# Patient Record
Sex: Female | Born: 1955 | Race: White | Hispanic: No | Marital: Married | Smoking: Former smoker
Health system: Southern US, Community
[De-identification: ages and names within clinical notes are randomized; demographics above are authoritative.]

## PROBLEM LIST (undated history)

## (undated) DIAGNOSIS — J45909 Unspecified asthma, uncomplicated: Secondary | ICD-10-CM

## (undated) DIAGNOSIS — Z9989 Dependence on other enabling machines and devices: Secondary | ICD-10-CM

## (undated) DIAGNOSIS — M1990 Unspecified osteoarthritis, unspecified site: Secondary | ICD-10-CM

## (undated) DIAGNOSIS — E785 Hyperlipidemia, unspecified: Secondary | ICD-10-CM

## (undated) DIAGNOSIS — G510 Bell's palsy: Secondary | ICD-10-CM

## (undated) DIAGNOSIS — F419 Anxiety disorder, unspecified: Secondary | ICD-10-CM

## (undated) DIAGNOSIS — J449 Chronic obstructive pulmonary disease, unspecified: Secondary | ICD-10-CM

## (undated) DIAGNOSIS — D3613 Benign neoplasm of peripheral nerves and autonomic nervous system of lower limb, including hip: Secondary | ICD-10-CM

## (undated) DIAGNOSIS — I10 Essential (primary) hypertension: Secondary | ICD-10-CM

## (undated) DIAGNOSIS — Z973 Presence of spectacles and contact lenses: Secondary | ICD-10-CM

## (undated) DIAGNOSIS — I219 Acute myocardial infarction, unspecified: Secondary | ICD-10-CM

## (undated) DIAGNOSIS — G4733 Obstructive sleep apnea (adult) (pediatric): Secondary | ICD-10-CM

## (undated) DIAGNOSIS — N951 Menopausal and female climacteric states: Secondary | ICD-10-CM

## (undated) DIAGNOSIS — Z87898 Personal history of other specified conditions: Secondary | ICD-10-CM

## (undated) HISTORY — DX: Chronic obstructive pulmonary disease, unspecified: J44.9

## (undated) HISTORY — DX: Bell's palsy: G51.0

## (undated) HISTORY — DX: Hyperlipidemia, unspecified: E78.5

## (undated) HISTORY — DX: Dependence on other enabling machines and devices: Z99.89

## (undated) HISTORY — DX: Obstructive sleep apnea (adult) (pediatric): G47.33

## (undated) HISTORY — DX: Acute myocardial infarction, unspecified: I21.9

## (undated) HISTORY — DX: Essential (primary) hypertension: I10

## (undated) HISTORY — DX: Benign neoplasm of peripheral nerves and autonomic nervous system of lower limb, including hip: D36.13

---

## 1999-01-30 ENCOUNTER — Other Ambulatory Visit: Admission: RE | Admit: 1999-01-30 | Discharge: 1999-01-30 | Payer: Self-pay | Admitting: Family Medicine

## 2000-03-04 ENCOUNTER — Encounter: Payer: Self-pay | Admitting: Orthopedic Surgery

## 2000-03-04 ENCOUNTER — Ambulatory Visit (HOSPITAL_COMMUNITY): Admission: RE | Admit: 2000-03-04 | Discharge: 2000-03-04 | Payer: Self-pay | Admitting: Orthopedic Surgery

## 2000-03-21 ENCOUNTER — Ambulatory Visit (HOSPITAL_BASED_OUTPATIENT_CLINIC_OR_DEPARTMENT_OTHER): Admission: RE | Admit: 2000-03-21 | Discharge: 2000-03-21 | Payer: Self-pay | Admitting: Orthopedic Surgery

## 2000-04-16 HISTORY — PX: ELBOW SURGERY: SHX618

## 2000-07-11 ENCOUNTER — Other Ambulatory Visit: Admission: RE | Admit: 2000-07-11 | Discharge: 2000-07-11 | Payer: Self-pay | Admitting: Family Medicine

## 2001-07-22 ENCOUNTER — Other Ambulatory Visit: Admission: RE | Admit: 2001-07-22 | Discharge: 2001-07-22 | Payer: Self-pay | Admitting: Family Medicine

## 2002-08-11 ENCOUNTER — Ambulatory Visit (HOSPITAL_BASED_OUTPATIENT_CLINIC_OR_DEPARTMENT_OTHER): Admission: RE | Admit: 2002-08-11 | Discharge: 2002-08-11 | Payer: Self-pay | Admitting: Pulmonary Disease

## 2002-08-11 ENCOUNTER — Encounter: Payer: Self-pay | Admitting: Pulmonary Disease

## 2002-10-28 ENCOUNTER — Ambulatory Visit (HOSPITAL_BASED_OUTPATIENT_CLINIC_OR_DEPARTMENT_OTHER): Admission: RE | Admit: 2002-10-28 | Discharge: 2002-10-28 | Payer: Self-pay | Admitting: Pulmonary Disease

## 2003-04-29 ENCOUNTER — Ambulatory Visit (HOSPITAL_COMMUNITY): Admission: RE | Admit: 2003-04-29 | Discharge: 2003-04-30 | Payer: Self-pay | Admitting: Otolaryngology

## 2003-07-20 ENCOUNTER — Encounter: Admission: RE | Admit: 2003-07-20 | Discharge: 2003-07-20 | Payer: Self-pay | Admitting: Family Medicine

## 2003-10-08 ENCOUNTER — Other Ambulatory Visit: Admission: RE | Admit: 2003-10-08 | Discharge: 2003-10-08 | Payer: Self-pay | Admitting: Family Medicine

## 2003-12-24 ENCOUNTER — Ambulatory Visit (HOSPITAL_COMMUNITY): Admission: RE | Admit: 2003-12-24 | Discharge: 2003-12-24 | Payer: Self-pay | Admitting: Obstetrics and Gynecology

## 2003-12-24 ENCOUNTER — Encounter (INDEPENDENT_AMBULATORY_CARE_PROVIDER_SITE_OTHER): Payer: Self-pay | Admitting: Specialist

## 2004-06-21 ENCOUNTER — Ambulatory Visit: Payer: Self-pay | Admitting: Family Medicine

## 2004-06-22 ENCOUNTER — Ambulatory Visit: Payer: Self-pay | Admitting: Family Medicine

## 2004-06-26 ENCOUNTER — Ambulatory Visit: Payer: Self-pay | Admitting: Family Medicine

## 2004-07-04 ENCOUNTER — Ambulatory Visit: Payer: Self-pay | Admitting: Family Medicine

## 2004-07-21 ENCOUNTER — Ambulatory Visit: Payer: Self-pay | Admitting: Family Medicine

## 2004-09-19 ENCOUNTER — Ambulatory Visit: Payer: Self-pay | Admitting: Family Medicine

## 2004-10-09 ENCOUNTER — Ambulatory Visit: Payer: Self-pay | Admitting: Family Medicine

## 2004-11-23 ENCOUNTER — Ambulatory Visit: Payer: Self-pay | Admitting: Family Medicine

## 2004-11-23 ENCOUNTER — Other Ambulatory Visit: Admission: RE | Admit: 2004-11-23 | Discharge: 2004-11-23 | Payer: Self-pay | Admitting: Family Medicine

## 2005-01-17 ENCOUNTER — Ambulatory Visit: Payer: Self-pay | Admitting: Family Medicine

## 2005-04-16 ENCOUNTER — Encounter: Payer: Self-pay | Admitting: Family Medicine

## 2005-04-16 LAB — CONVERTED CEMR LAB

## 2005-06-12 ENCOUNTER — Ambulatory Visit: Payer: Self-pay | Admitting: Family Medicine

## 2005-07-06 ENCOUNTER — Ambulatory Visit: Payer: Self-pay | Admitting: Family Medicine

## 2005-07-24 ENCOUNTER — Ambulatory Visit: Payer: Self-pay | Admitting: Family Medicine

## 2005-10-23 ENCOUNTER — Ambulatory Visit: Payer: Self-pay | Admitting: Family Medicine

## 2005-11-20 ENCOUNTER — Ambulatory Visit: Payer: Self-pay | Admitting: Family Medicine

## 2005-11-20 ENCOUNTER — Other Ambulatory Visit: Admission: RE | Admit: 2005-11-20 | Discharge: 2005-11-20 | Payer: Self-pay | Admitting: Family Medicine

## 2005-11-20 ENCOUNTER — Encounter: Payer: Self-pay | Admitting: Family Medicine

## 2006-09-18 ENCOUNTER — Ambulatory Visit: Payer: Self-pay | Admitting: Family Medicine

## 2006-11-08 ENCOUNTER — Telehealth (INDEPENDENT_AMBULATORY_CARE_PROVIDER_SITE_OTHER): Payer: Self-pay | Admitting: *Deleted

## 2006-11-21 ENCOUNTER — Encounter: Payer: Self-pay | Admitting: Family Medicine

## 2006-11-21 DIAGNOSIS — J309 Allergic rhinitis, unspecified: Secondary | ICD-10-CM | POA: Insufficient documentation

## 2006-11-21 DIAGNOSIS — H8110 Benign paroxysmal vertigo, unspecified ear: Secondary | ICD-10-CM | POA: Insufficient documentation

## 2006-11-21 DIAGNOSIS — N943 Premenstrual tension syndrome: Secondary | ICD-10-CM | POA: Insufficient documentation

## 2006-11-28 ENCOUNTER — Encounter: Payer: Self-pay | Admitting: Family Medicine

## 2006-11-28 ENCOUNTER — Ambulatory Visit: Payer: Self-pay | Admitting: Family Medicine

## 2006-11-28 ENCOUNTER — Other Ambulatory Visit: Admission: RE | Admit: 2006-11-28 | Discharge: 2006-11-28 | Payer: Self-pay | Admitting: Family Medicine

## 2006-11-28 DIAGNOSIS — F3342 Major depressive disorder, recurrent, in full remission: Secondary | ICD-10-CM | POA: Insufficient documentation

## 2006-11-28 LAB — CONVERTED CEMR LAB
ALT: 17 units/L (ref 0–35)
AST: 17 units/L (ref 0–37)
Alkaline Phosphatase: 73 units/L (ref 39–117)
BUN: 10 mg/dL (ref 6–23)
Basophils Absolute: 0 10*3/uL (ref 0.0–0.1)
Basophils Relative: 0.4 % (ref 0.0–1.0)
Bilirubin, Direct: 0.1 mg/dL (ref 0.0–0.3)
CO2: 32 meq/L (ref 19–32)
Calcium: 9.6 mg/dL (ref 8.4–10.5)
Chloride: 105 meq/L (ref 96–112)
GFR calc Af Amer: 113 mL/min
GFR calc non Af Amer: 94 mL/min
Glucose, Bld: 80 mg/dL (ref 70–99)
HCT: 38 % (ref 36.0–46.0)
Hemoglobin: 12.9 g/dL (ref 12.0–15.0)
Monocytes Absolute: 0.4 10*3/uL (ref 0.2–0.7)
Neutrophils Relative %: 66.3 % (ref 43.0–77.0)
RBC: 4.47 M/uL (ref 3.87–5.11)
RDW: 13.1 % (ref 11.5–14.6)
Total CHOL/HDL Ratio: 4.6
Total Protein: 6.9 g/dL (ref 6.0–8.3)
Triglycerides: 132 mg/dL (ref 0–149)
VLDL: 26 mg/dL (ref 0–40)
WBC: 6.5 10*3/uL (ref 4.5–10.5)

## 2007-07-22 ENCOUNTER — Ambulatory Visit: Payer: Self-pay | Admitting: Family Medicine

## 2007-08-26 ENCOUNTER — Telehealth: Payer: Self-pay | Admitting: Family Medicine

## 2008-01-06 ENCOUNTER — Ambulatory Visit: Payer: Self-pay | Admitting: Family Medicine

## 2008-01-12 ENCOUNTER — Telehealth: Payer: Self-pay | Admitting: Family Medicine

## 2008-01-14 ENCOUNTER — Emergency Department (HOSPITAL_COMMUNITY): Admission: EM | Admit: 2008-01-14 | Discharge: 2008-01-14 | Payer: Self-pay | Admitting: Emergency Medicine

## 2008-01-14 ENCOUNTER — Telehealth: Payer: Self-pay | Admitting: Family Medicine

## 2008-01-15 ENCOUNTER — Ambulatory Visit: Payer: Self-pay | Admitting: Family Medicine

## 2008-01-15 DIAGNOSIS — F411 Generalized anxiety disorder: Secondary | ICD-10-CM | POA: Insufficient documentation

## 2008-02-24 ENCOUNTER — Ambulatory Visit: Payer: Self-pay | Admitting: Gastroenterology

## 2008-04-16 HISTORY — PX: EXCISION MORTON'S NEUROMA: SHX5013

## 2008-08-19 DIAGNOSIS — M2550 Pain in unspecified joint: Secondary | ICD-10-CM

## 2008-08-19 DIAGNOSIS — M2559 Pain in other specified joint: Secondary | ICD-10-CM | POA: Insufficient documentation

## 2008-09-20 ENCOUNTER — Ambulatory Visit: Payer: Self-pay | Admitting: Family Medicine

## 2008-09-20 LAB — CONVERTED CEMR LAB
ALT: 17 units/L (ref 0–35)
AST: 23 units/L (ref 0–37)
Albumin: 3.8 g/dL (ref 3.5–5.2)
Anti Nuclear Antibody(ANA): NEGATIVE
Basophils Relative: 0 % (ref 0.0–3.0)
Eosinophils Relative: 1.1 % (ref 0.0–5.0)
HCT: 35.1 % — ABNORMAL LOW (ref 36.0–46.0)
Hemoglobin: 11.8 g/dL — ABNORMAL LOW (ref 12.0–15.0)
Lymphs Abs: 2.5 10*3/uL (ref 0.7–4.0)
MCV: 85.1 fL (ref 78.0–100.0)
Monocytes Absolute: 0.2 10*3/uL (ref 0.1–1.0)
Monocytes Relative: 3.1 % (ref 3.0–12.0)
Neutro Abs: 4.4 10*3/uL (ref 1.4–7.7)
Platelets: 290 10*3/uL (ref 150.0–400.0)
Sed Rate: 22 mm/hr (ref 0–22)
TSH: 3.91 microintl units/mL (ref 0.35–5.50)
Total Protein: 7.2 g/dL (ref 6.0–8.3)
Vitamin B-12: 179 pg/mL — ABNORMAL LOW (ref 211–911)
WBC: 7.2 10*3/uL (ref 4.5–10.5)

## 2008-09-21 ENCOUNTER — Ambulatory Visit: Payer: Self-pay | Admitting: Family Medicine

## 2008-09-23 ENCOUNTER — Ambulatory Visit: Payer: Self-pay | Admitting: Family Medicine

## 2008-11-22 ENCOUNTER — Ambulatory Visit: Payer: Self-pay | Admitting: Family Medicine

## 2008-11-30 ENCOUNTER — Ambulatory Visit: Payer: Self-pay | Admitting: Pulmonary Disease

## 2008-11-30 DIAGNOSIS — G4733 Obstructive sleep apnea (adult) (pediatric): Secondary | ICD-10-CM | POA: Insufficient documentation

## 2008-12-17 ENCOUNTER — Telehealth: Payer: Self-pay | Admitting: Family Medicine

## 2009-02-03 ENCOUNTER — Ambulatory Visit: Payer: Self-pay | Admitting: Family Medicine

## 2009-02-16 ENCOUNTER — Telehealth: Payer: Self-pay | Admitting: Family Medicine

## 2009-04-11 DIAGNOSIS — M25559 Pain in unspecified hip: Secondary | ICD-10-CM | POA: Insufficient documentation

## 2009-04-20 ENCOUNTER — Ambulatory Visit: Payer: Self-pay | Admitting: Family Medicine

## 2009-06-28 ENCOUNTER — Telehealth: Payer: Self-pay | Admitting: Family Medicine

## 2009-06-28 ENCOUNTER — Encounter: Admission: RE | Admit: 2009-06-28 | Discharge: 2009-09-09 | Payer: Self-pay | Admitting: Rheumatology

## 2009-06-29 ENCOUNTER — Telehealth: Payer: Self-pay | Admitting: Family Medicine

## 2009-09-29 ENCOUNTER — Ambulatory Visit: Payer: Self-pay | Admitting: Family Medicine

## 2009-09-30 ENCOUNTER — Telehealth: Payer: Self-pay | Admitting: Family Medicine

## 2009-12-01 ENCOUNTER — Telehealth: Payer: Self-pay | Admitting: Pulmonary Disease

## 2010-02-03 ENCOUNTER — Ambulatory Visit: Payer: Self-pay | Admitting: Family Medicine

## 2010-03-06 ENCOUNTER — Telehealth: Payer: Self-pay | Admitting: Family Medicine

## 2010-04-07 ENCOUNTER — Telehealth: Payer: Self-pay | Admitting: Family Medicine

## 2010-05-16 NOTE — Progress Notes (Signed)
Summary: nos appt  Phone Note Call from Patient   Caller: [email or web address]  Call For: clance Summary of Call: LMTCB x2 to rsc nos from 8/17. Initial call taken by: Darletta Moll,  December 01, 2009 2:36 PM

## 2010-05-16 NOTE — Progress Notes (Signed)
Summary: fyi  Phone Note Call from Patient   Summary of Call: patient would like a rx for her shoulder.  she has been going to physical therapy.  she is not sleeping well because of the pain. any suggestions? [phone] work number Initial call taken by: [name] CMA [name]),  June 29, 2009 10:55 AM  Follow-up for Phone Call        if the over-the-counter medication Motrin, 600 mg 3 times a day with food.  It is not controlling her pain and I would recommend that she see an orthopedist.... Dr. Cleophas Dunker.  She can call and make her an appointment Follow-up by: [name] MD,  June 29, 2009 11:31 AM  Additional Follow-up for Phone Call Additional follow up Details #1::        pt has seen an orthopedist and they sent her to PT. I advised her to call her orthopedist  Additional Follow-up by: [name] CMA [name]),  June 29, 2009 11:58 AM

## 2010-05-16 NOTE — Progress Notes (Signed)
Summary: wants rx  Phone Note Call from Patient Call back at Work Phone [phone]   Caller: Patient----triage vm Summary of Call: having hip pain. has seen another dr which they said it was arthritis. wants rx for arthritis.......[facility]. Initial call taken by: Warnell Forester,  March 06, 2010 10:08 AM  Follow-up for Phone Call        Fleet Contras please call Delorese............ who did she see and what the day recommend she take for the arthritis????????? Follow-up by: [name] MD,  March 06, 2010 10:30 AM  Additional Follow-up for Phone Call Additional follow up Details #1::        patient went to see Dr Cleophas Dunker and he suggested PT.  However the patient can not afford PT at this time and can not remember the name of medication he suggested.  She also needs a refill of B12.  B12 rx sent. Additional Follow-up by: [name] CMA [name]),  March 06, 2010 10:54 AM    Additional Follow-up for Phone Call Additional follow up Details #2::    begin with Motrin, 600 mg twice daily with food.  If this is not control the pain then we need to see her in the office Follow-up by: [name] MD,  March 06, 2010 12:50 PM  Additional Follow-up for Phone Call Additional follow up Details #3:: Details for Additional Follow-up Action Taken: patient is aware Additional Follow-up by: [name] CMA [name]),  March 06, 2010 2:22 PM  Prescriptions: BD DISP NEEDLE 27G X 1-1/4" MISC (NEEDLE (DISP)) UAD for vit b 12  #25 x 1   Entered by:   [name] CMA (AAMA)   Authorized by:   [name] MD   Signed by:   [name] CMA (AAMA) on 03/06/2010   Method used:   [facility] [address] [phone]* (retail)       [address]       [place], [place]  [address]       Ph: [phone] or [phone]       Fax: [phone]   RxID:   [id number] CYANOCOBALAMIN 1000 MCG/ML SOLN (CYANOCOBALAMIN) 1 cc weekly x 8  weeks, then 1 cc monthly forever  #30 cc x 1   Entered by:   [name] CMA (AAMA)   Authorized by:   [name] MD   Signed by:   [name] CMA (AAMA) on 03/06/2010   Method used:   [facility]        [facility] [address] [phone]* (retail)       [address]       [place], [place]  [address]       Ph: [phone] or [phone]       Fax: [phone]   RxID:   [id number]

## 2010-05-16 NOTE — Assessment & Plan Note (Signed)
Summary: back hip and leg pain//ccm   Vital Signs:  Patient profile:   55 year old female Weight:      215 pounds Temp:     98.2 degrees F oral BP sitting:   124 / 80  (left arm) Cuff size:   regular  Vitals Entered By: [name] CMA [name]) (April 20, 2009 8:28 AM)  Reason for Visit lower back and hip pain  Primary Care Provider:  Dr. [name]   History of Present Illness: Erika [name] is a 55 year old, married female, nonsmoker, comes in today for evaluation of pain in her left hip.  On Monday December the 27th she was at work and fell.  She went to sit in a chair, which has wheels.  The chair moved in she hit the floor.  She went to an urgent care center for evaluation.  X-rays were done, negative for fracture.  She was given tramadol, and Flexeril.  She states the pain is persistent and she points to her left box as the source of the discomfort.  She has no difficulty walking, and no neurologic symptoms.  Review of systems otherwise negative  Allergies: 1)  ! Tetracycline Hcl (Tetracycline Hcl) 2)  ! Indocin Sr 3)  ! Lisinopril-Hydrochlorothiazide (Lisinopril-Hydrochlorothiazide)  Past History:  Past medical, surgical, family and social histories (including risk factors) reviewed for relevance to current acute and chronic problems.  Past Medical History: Reviewed history from 11/30/2008 and no changes required. Allergic rhinitis pms BPV moodiness Anxiety OSA  Past Surgical History: Reviewed history from 11/21/2006 and no changes required. CB X2 Tubal ligation, bilateral  Family History: Reviewed history from 11/30/2008 and no changes required. Family History of Arthritis allergies, sister, brother, mother heart disease- mother and father cancer- brother lymphoma  Social History: Reviewed history from 11/30/2008 and no changes required. Occupation: Married Former Smoker Alcohol use-no Drug use-no Regular Designer, multimedia for [employer]  Review of  Systems      See HPI  Physical Exam  General:  Well-developed,well-nourished,in no acute distress; alert,appropriate and cooperative throughout examination Msk:  No deformity or scoliosis noted of thoracic or lumbar spine.   Pulses:  R and L carotid,radial,femoral,dorsalis pedis and posterior tibial pulses are full and equal bilaterally Extremities:  No clubbing, cyanosis, edema, or deformity noted with normal full range of motion of all joints.   Neurologic:  No cranial nerve deficits noted. Station and gait are normal. Plantar reflexes are down-going bilaterally. DTRs are symmetrical throughout. Sensory, motor and coordinative functions appear intact.   Impression & Recommendations:  Problem # 1:  HIP PAIN, LEFT (ICD-719.45) Assessment New  Her updated medication list for this problem includes:    Aleve 220 Mg Tabs (Naproxen sodium) .Marland Kitchen... Prn    Tramadol Hcl 50 Mg Tabs (Tramadol hcl) .Marland Kitchen... Take one tab every 6 hours as needed pain    Flexeril 5 Mg Tabs (Cyclobenzaprine hcl) .Marland Kitchen... Take one tab three times a day as needed  Orders: Prescription Created Electronically [id number]  Complete Medication List: 1)  Aleve 220 Mg Tabs (Naproxen sodium) .... Prn 2)  Cyanocobalamin 1000 Mcg/ml Soln (Cyanocobalamin) .Marland Kitchen.. 1 cc weekly x 8 weeks, then 1 cc monthly forever 3)  Bd Disp Needle 27g X 1-1/4" Misc (Needle (disp)) .... Uad for vit b 12 4)  Celexa 40 Mg Tabs (Citalopram hydrobromide) .... Once daily 5)  Lorazepam 0.5 Mg Tabs (Lorazepam) .... As needed 6)  Tramadol Hcl 50 Mg Tabs (Tramadol hcl) .... Take one tab every 6 hours as  needed pain 7)  Flexeril 5 Mg Tabs (Cyclobenzaprine hcl) .... Take one tab three times a day as needed  Patient Instructions: 1)  take Motrin, 600 mg twice a day with food. 2)  u may take a tramadol, and /or a Flexeril at bedtime as needed for nighttime pain.  Return p.r.n. Prescriptions: FLEXERIL 5 MG TABS (CYCLOBENZAPRINE HCL) take one tab three times a day as  needed  #30 x 1   Entered and Authorized by:   [name] MD   Signed by:   [name] MD on 04/20/2009   Method used:   Print then Give to Patient   RxID:   [id number] TRAMADOL HCL 50 MG TABS (TRAMADOL HCL) take one tab every 6 hours as needed pain  #30 x 1   Entered and Authorized by:   [name] MD   Signed by:   [name] MD on 04/20/2009   Method used:   Print then Give to Patient   RxID:   [id number]

## 2010-05-16 NOTE — Assessment & Plan Note (Signed)
Summary: pain in rt side/constipation/cjr   Vital Signs:  Patient profile:   55 year old female Weight:      213 pounds Temp:     97.6 degrees F oral BP sitting:   130 / 80  (left arm) Cuff size:   regular  Vitals Entered By: Kathrynn Speed CMA (September 29, 2009 3:25 PM) CC: Pain in right side of hip  back   Primary Care Provider:  Dr. [name]  CC:  Pain in right side of hip  back.  History of Present Illness: Erika [name] is a 55 year old female, who comes in today complaining of a 6 week history of atraumatic right hip pain.  She's had the gradual onset of atraumatic pain about 6 weeks ago.  She states the pain is constant.  It sharp, a 6 on a scale of one to 10.  It tends to radiate to the mid lower spine.  Review of systems negative  Current Medications (verified): 1)  Aleve 220 Mg  Tabs (Naproxen Sodium) .... Prn 2)  Cyanocobalamin 1000 Mcg/ml Soln (Cyanocobalamin) .Marland Kitchen.. 1 Cc Weekly X 8 Weeks, Then 1 Cc Monthly Forever 3)  Bd Disp Needle 27g X 1-1/4" Misc (Needle (Disp)) .... Uad For Vit B 12 4)  Celexa 40 Mg Tabs (Citalopram Hydrobromide) .... Once Daily 5)  Lorazepam 0.5 Mg Tabs (Lorazepam) .... As Needed  Allergies (verified): 1)  ! Tetracycline Hcl (Tetracycline Hcl) 2)  ! Indocin Sr 3)  ! Lisinopril-Hydrochlorothiazide (Lisinopril-Hydrochlorothiazide)  Past History:  Past medical, surgical, family and social histories (including risk factors) reviewed for relevance to current acute and chronic problems.  Past Medical History: Reviewed history from 11/30/2008 and no changes required. Allergic rhinitis pms BPV moodiness Anxiety OSA  Past Surgical History: Reviewed history from 11/21/2006 and no changes required. CB X2 Tubal ligation, bilateral  Family History: Reviewed history from 11/30/2008 and no changes required. Family History of Arthritis allergies, sister, brother, mother heart disease- mother and father cancer- brother lymphoma  Social  History: Reviewed history from 11/30/2008 and no changes required. Occupation: Married Former Smoker Alcohol use-no Drug use-no Regular Designer, multimedia for [employer]  Review of Systems      See HPI  Physical Exam  General:  Well-developed,well-nourished,in no acute distress; alert,appropriate and cooperative throughout examination Msk:  No deformity or scoliosis noted of thoracic or lumbar spine.   Pulses:  R and L carotid,radial,femoral,dorsalis pedis and posterior tibial pulses are full and equal bilaterally Extremities:  No clubbing, cyanosis, edema, or deformity noted with normal full range of motion of all joints.   Neurologic:  No cranial nerve deficits noted. Station and gait are normal. Plantar reflexes are down-going bilaterally. DTRs are symmetrical throughout. Sensory, motor and coordinative functions appear intact.   Impression & Recommendations:  Problem # 1:  HIP PAIN, RIGHT (ICD-719.45) Assessment New  The following medications were removed from the medication list:    Tramadol Hcl 50 Mg Tabs (Tramadol hcl) .Marland Kitchen... Take one tab every 6 hours as needed pain    Flexeril 5 Mg Tabs (Cyclobenzaprine hcl) .Marland Kitchen... Take one tab three times a day as needed Her updated medication list for this problem includes:    Aleve 220 Mg Tabs (Naproxen sodium) .Marland Kitchen... Prn  Orders: T-Bilateral Hip w/Pelvis, min 2 views (73520TC)  Complete Medication List: 1)  Aleve 220 Mg Tabs (Naproxen sodium) .... Prn 2)  Cyanocobalamin 1000 Mcg/ml Soln (Cyanocobalamin) .Marland Kitchen.. 1 cc weekly x 8 weeks, then 1 cc monthly forever 3)  Bd Disp Needle  27g X 1-1/4" Misc (Needle (disp)) .... Uad for vit b 12 4)  Celexa 40 Mg Tabs (Citalopram hydrobromide) .... Once daily 5)  Lorazepam 0.5 Mg Tabs (Lorazepam) .... As needed  Patient Instructions: 1)  taking the stool softener of your choice milk of Magnesia two tablespoons twice a day or pruned juice  4 ounces twice a day study a having a two to 3 loose  bowel movements daily.  Drink 30 ounces of water a day and increase your fruit intake. 2)  I will call you when we get the report on the x-ray of your hip. 3)  In the meantime, continue Aleve one twice daily

## 2010-05-16 NOTE — Progress Notes (Signed)
Summary: lorazepam refill  Phone Note From Pharmacy   Summary of Call: patient requests a refill of lorazepam is this okay to fill? last office visit 1/11 Initial call taken by: [name] CMA [name]),  June 28, 2009 12:06 PM  Follow-up for Phone Call        lorazepam .5 mg, dispensed 30 tablets, directions one daily p.r.n. refills x 3 Follow-up by: [name] MD,  June 28, 2009 1:21 PM  Additional Follow-up for Phone Call Additional follow up Details #1::        fax sent to pharmacy Additional Follow-up by: [name] CMA [name]),  June 28, 2009 4:49 PM

## 2010-05-16 NOTE — Assessment & Plan Note (Signed)
Summary: ?sinus inf/njr   Vital Signs:  Patient profile:   55 year old female Height:      66 inches Weight:      211 pounds BMI:     34.18 Temp:     98.2 degrees F oral BP sitting:   120 / 84  (left arm) Cuff size:   regular  Vitals Entered By: [name] CMA [name]) (February 03, 2010 10:32 AM) CC: chest congestions, cough   Primary Care Provider:  Dr. [name]  CC:  chest congestions and cough.  History of Present Illness: Erika [name] is a 55 year old, married female, nonsmoker, who comes in today for a flare of her allergic rhinitis.  She wants something to get her well real quick because her son is getting married in the morning  Allergies: 1)  ! Tetracycline Hcl (Tetracycline Hcl) 2)  ! Indocin Sr 3)  ! Lisinopril-Hydrochlorothiazide (Lisinopril-Hydrochlorothiazide) PMH-FH-SH reviewed for relevance  Social History: Reviewed history from 11/30/2008 and no changes required. 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 No apparent alopecia or balding. Eyes:  No corneal or conjunctival inflammation noted. EOMI. Perrla. Funduscopic exam benign, without hemorrhages, exudates or papilledema. Vision grossly normal. Ears:  External ear exam shows no significant lesions or deformities.  Otoscopic examination reveals clear canals, tympanic membranes are intact bilaterally without bulging, retraction, inflammation or discharge. Hearing is grossly normal bilaterally. Nose:  septum in the midline, 3+ nasal edema Mouth:  Oral mucosa and oropharynx without lesions or exudates.  Teeth in good repair. Neck:  No deformities, masses, or tenderness noted. Lungs:  Normal respiratory effort, chest  expands symmetrically. Lungs are clear to auscultation, no crackles or wheezes.   Impression & Recommendations:  Problem # 1:  ALLERGIC RHINITIS (ICD-477.9) Assessment Deteriorated  Complete Medication List: 1)  Aleve 220 Mg Tabs (Naproxen sodium) .... Prn 2)  Cyanocobalamin 1000 Mcg/ml Soln (Cyanocobalamin) .Marland Kitchen.. 1 cc weekly x 8 weeks, then 1 cc monthly forever 3)  Bd Disp Needle 27g X 1-1/4" Misc (Needle (disp)) .... Uad for vit b 12 4)  Celexa 40 Mg Tabs (Citalopram hydrobromide) .... Once daily 5)  Lorazepam 0.5 Mg Tabs (Lorazepam) .... As needed 6)  Prednisone 20 Mg Tabs (Prednisone) .... Uad  Patient Instructions: 1)  begin prednisone, take two tablets now, one x 3 days, a half x 3 days and stop.  Also, plain Claritin, 10 mg in the morning or plain Zyrtec 10 mg at bedtime 2)  Please schedule a follow-up appointment as needed. Prescriptions: PREDNISONE 20 MG TABS (PREDNISONE) UAD  #30 x 1   Entered and Authorized by:   [name] MD   Signed by:   [name] MD on 02/03/2010   Method used:   [facility]        [facility] [address] [phone]* (retail)       [address]       [place], [place]  [address]       Ph: [phone] or [phone]       Fax: [phone]   RxID:   [id number]    Orders Added: 1)  Est. Patient Level III [36644]

## 2010-05-16 NOTE — Progress Notes (Signed)
Summary: xray results  Phone Note Call from Patient   Caller: Patient Call For: [name] MD Summary of Call: Pt is calling for xray results, please. [phone] Initial call taken by: Lynann Beaver CMA,  September 30, 2009 12:22 PM  Follow-up for Phone Call        Phone Call Completed Follow-up by: [name] CMA [name]),  September 30, 2009 12:54 PM

## 2010-05-18 NOTE — Progress Notes (Signed)
Summary: refill request  Phone Note Refill Request Message from:  Fax from Pharmacy on April 07, 2010 2:03 PM  Refills Requested: Medication #1:  BD DISP NEEDLE 27G X 1-1/4" MISC UAD for vit b 12 Initial call taken by: [name] CMA [name]),  April 07, 2010 2:03 PM    Prescriptions: BD DISP NEEDLE 27G X 1-1/4" MISC (NEEDLE (DISP)) UAD for vit b 12  #25 x 6   Entered by:   [name] CMA (AAMA)   Authorized by:   [name] MD   Signed by:   [name] CMA (AAMA) on 04/07/2010   Method used:   Electronically to        [facility] [address] [phone]* (retail)       [address]       [place], [place]  [address]       Ph: [phone] or [phone]       Fax: [phone]   RxID:   [id number]

## 2010-06-01 ENCOUNTER — Ambulatory Visit (INDEPENDENT_AMBULATORY_CARE_PROVIDER_SITE_OTHER): Payer: BC Managed Care – PPO | Admitting: Family Medicine

## 2010-06-01 ENCOUNTER — Encounter: Payer: Self-pay | Admitting: Family Medicine

## 2010-06-01 VITALS — BP 130/90 | Temp 99.1°F | Ht 66.0 in | Wt 205.0 lb

## 2010-06-01 DIAGNOSIS — J069 Acute upper respiratory infection, unspecified: Secondary | ICD-10-CM

## 2010-06-01 DIAGNOSIS — J45901 Unspecified asthma with (acute) exacerbation: Secondary | ICD-10-CM

## 2010-06-01 MED ORDER — HYDROCODONE-HOMATROPINE 5-1.5 MG/5ML PO SYRP: 5.0000 mL | ORAL_SOLUTION | Freq: Four times a day (QID) | ORAL | Status: DC | PRN

## 2010-06-01 MED ORDER — PREDNISONE 20 MG PO TABS: ORAL_TABLET | ORAL | Status: DC

## 2010-06-01 NOTE — Patient Instructions (Signed)
Drink lots of water.  Hydromet one half to 1 teaspoon 3 times a day as needed for cough.  Prednisone one tab x 3 days, a half x 3 days, then half a tablet Monday, Wednesday, Friday, for two week taper.  Return p.r.n.

## 2010-06-01 NOTE — Progress Notes (Signed)
  Subjective:    Patient ID: Erika [name], female    DOB: [birth year], 55 y.o.   MRN: [id number]  HPI Erika [name] is a 55 year old female, nonsmoker, who comes in with a 4-day history of head congestion, sore throat, and cough.  She said no fever, nausea, vomiting, diarrhea.  She has had a history of asthma in the past, when she gets a bad cold   Review of Systems  Constitutional: Negative.   HENT: Negative.   Eyes: Negative.   Respiratory: Negative.   Cardiovascular: Negative.   Gastrointestinal: Negative.   Genitourinary: Negative.   Musculoskeletal: Negative.   Neurological: Negative.   Hematological: Negative.   Psychiatric/Behavioral: Negative.        Objective:   Physical Exam  Constitutional: She appears well-developed and well-nourished.  HENT:  Head: Normocephalic and atraumatic.  Right Ear: External ear normal.  Left Ear: External ear normal.  Nose: Nose normal.  Mouth/Throat: Oropharynx is clear and moist.  Neck: Normal range of motion. Neck supple.  Pulmonary/Chest: Effort normal and breath sounds normal. She has no wheezes.  Lymphadenopathy:    She has no cervical adenopathy.          Assessment & Plan:  Viral syndrome with secondary mild wheezing.  Drink lots of liquids, prednisone, one tab x 3 days, after x 3 days, then half a tablet Monday, Wednesday, Friday, for a two week taper.  Hydromet one half to 1 teaspoon 3 times a day p.r.n. Cough.  Return p.r.n.

## 2010-08-03 ENCOUNTER — Other Ambulatory Visit: Payer: Self-pay | Admitting: Family Medicine

## 2010-09-01 NOTE — Op Note (Signed)
NAME:  Erika [name], SZCZERBA                          ACCOUNT NO.:  [id number]   MEDICAL RECORD NO.:  [id number]                   PATIENT TYPE:  AMB   LOCATION:  [facility]                                  FACILITY:  [facility]   PHYSICIAN:  Carrington Clamp, M.D.              DATE OF BIRTH:  1955-07-07   DATE OF PROCEDURE:  12/24/2003  DATE OF DISCHARGE:                                 OPERATIVE REPORT   PREOPERATIVE DIAGNOSES:  Postmenopausal bleeding, thickened endometrial  stripe.   POSTOPERATIVE DIAGNOSES:  Postmenopausal bleeding, thickened endometrial  stripe.   PROCEDURE:  D&C hysteroscopy.   ATTENDING PHYSICIAN:  Carrington Clamp, M.D.   ANESTHESIA:  General endotracheal anesthesia.   ESTIMATED BLOOD LOSS:  Minimal.   IV FLUIDS:  400 mL.   URINE OUTPUT:  Not measured.   COMPLICATIONS:  None.   FINDINGS:  Small band of tissue at the top of the right uterus extending  from the anterior fundus to the posterior fundus. This was removed with  polyp forceps and curettage. 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   MEDICATIONS:  None.   COUNTS:  Correct x3.   DESCRIPTION OF PROCEDURE:  After adequate general anesthesia was achieved,  the patient was prepped and draped in the usual sterile fashion in the  dorsal lithotomy position. The bladder was emptied with a red rubber  catheter and speculum placed in the vagina. The single tooth tenaculum was  used to grasp the cervix and ECC was performed first.  The cervix was then  dilated and additional ECC material was obtained. The cervix had been  dilated with Shawnie Pons dilators and the hysteroscope passed into the uterus. The  above findings were noted including and interesting band of tissue that  appeared to run from anterior to posterior at the top of the fundus but not  all the way to the top of the fundus. This was then attempted to be grasped  with polyp forceps and  most of it appeared to be removed after polyp forceps  had been used. Vigorous curettage was then performed to obtain as much  tissue as  possible. The band of tissue seemed to be removed after the curettage as  well and all instruments were then withdrawn from the vagina.  The patient  tolerated the procedure well and was transferred to the recovery room in  stable condition.                                               Carrington Clamp, M.D.    MH/MEDQ  D:  12/24/2003  T:  12/24/2003  Job:  [id number]

## 2010-09-01 NOTE — Op Note (Signed)
NAME:  CECIL, BIXBY                          ACCOUNT NO.:  [id number]   MEDICAL RECORD NO.:  [id number]                   PATIENT TYPE:  OIB   LOCATION:  2310                                 FACILITY:  [facility]   PHYSICIAN:  Suzanna Obey, M.D.                    DATE OF BIRTH:  March 26, 1956   DATE OF PROCEDURE:  04/29/2003  DATE OF DISCHARGE:                                 OPERATIVE REPORT   PREOPERATIVE DIAGNOSIS:  Deviated septum and turbinate hypertrophy with  obstructive sleep apnea.   POSTOPERATIVE DIAGNOSIS:  Deviated septum and turbinate hypertrophy with  obstructive sleep apnea.   PROCEDURE:  Septoplasty and submucous resection of inferior turbinates.   SURGEON:  Suzanna Obey, M.D.   ANESTHESIA:  General endotracheal tube.   ESTIMATED BLOOD LOSS:  Approximately 10 mL.   INDICATIONS FOR PROCEDURE:  This is a 55 year old who has had significant  problem with obstructive sleep apnea that have been refractory to medical  therapy with CPAP.  She cannot tolerate the CPAP secondary to the nasal  obstruction.  She was informed of the risks and benefits of the procedure  including bleeding, infection, perforation, change in external appearance of  the nose, chronic crusting and drying, numbness of the teeth and risks of  the anesthetic.  All questions were answered and consent was obtained.   DESCRIPTION OF PROCEDURE:  The patient was taken to the operating room and  placed in the supine position. After adequate general endotracheal tube  anesthesia, she was placed in the supine position, prepped and draped in the  usual sterile manner.  The oximetazolin pledgets were placed into the nose  bilaterally and then the septum and inferior turbinates were injected with  1% lidocaine with 1:100,000 epinephrine.  The right hemitransfixion incision  was performed raising a mucoperichondrial and ostial flap.  The cartilage  was significantly deviated to the right at the posterior aspect of it.   The  cartilage was divided about 2 cm posterior to the caudal strut and the  posterior portion of the cartilage was removed.  The bone was also  significantly deviated and this was removed with the Jansen-Middleton  forceps after elevating an opposite flap.  The inferior spur and bone were  removed with a 4 mm osteotome.  This corrected the septal deflection.  The  inferior turbinates were then both out fractured.  A midline incision was  made with a 15 blade and a mucosal flap elevated superiorly.  The inferior  mucosa and bone were removed with the turbinate scissors.  The edge was  cauterized with suction cautery, the turbinates were out fractured and the  flap was laid back down over the raw surface.  The hemitransfixion incision  was closed with interrupted 4-0  chromic and a 4-0 plain gut suture placed through the septum.  The patient  was then suctioned out of all blood  and debris from the nasopharynx and oral  cavity under direct visualization.  The patient was then awakened and  brought to the recovery room in stable condition.  Counts were correct.  8                                               Suzanna Obey, M.D.    Cordelia Pen  D:  04/29/2003  T:  04/29/2003  Job:  [id number]   cc:   Marcelyn Bruins, M.D. [facility] A. [name], M.D. [facility]

## 2010-10-06 ENCOUNTER — Other Ambulatory Visit: Payer: Self-pay | Admitting: Gynecology

## 2010-10-10 ENCOUNTER — Telehealth: Payer: Self-pay | Admitting: *Deleted

## 2010-10-10 DIAGNOSIS — Z1211 Encounter for screening for malignant neoplasm of colon: Secondary | ICD-10-CM

## 2010-10-10 NOTE — Telephone Encounter (Signed)
Pt requesting referral for colonoscopy.

## 2010-10-10 NOTE — Telephone Encounter (Signed)
Refer for screening colonoscopy 

## 2010-12-19 ENCOUNTER — Encounter: Payer: Self-pay | Admitting: Gastroenterology

## 2011-01-15 LAB — DIFFERENTIAL
Eosinophils Absolute: 0.1
Eosinophils Relative: 1
Lymphocytes Relative: 35
Lymphs Abs: 2.5
Monocytes Absolute: 0.4
Monocytes Relative: 5

## 2011-01-15 LAB — CBC
HCT: 39.6
MCV: 84.1
Platelets: 283
RDW: 14.3

## 2011-01-15 LAB — POCT I-STAT, CHEM 8
BUN: 8
Calcium, Ion: 1.07 — ABNORMAL LOW
Chloride: 110
HCT: 40
Potassium: 4.6
Sodium: 140

## 2011-01-15 LAB — COMPREHENSIVE METABOLIC PANEL
Albumin: 3.8
BUN: 7
Calcium: 9.8
Creatinine, Ser: 0.69
Total Protein: 6.8

## 2011-01-15 LAB — RPR: RPR Ser Ql: NONREACTIVE

## 2011-01-15 LAB — PROTIME-INR: INR: 1

## 2011-01-29 ENCOUNTER — Inpatient Hospital Stay (INDEPENDENT_AMBULATORY_CARE_PROVIDER_SITE_OTHER)
Admission: RE | Admit: 2011-01-29 | Discharge: 2011-01-29 | Disposition: A | Discharge status: Home / Self Care | Payer: BC Managed Care – PPO | Source: Ambulatory Visit | Attending: Family Medicine | Admitting: Family Medicine

## 2011-01-29 DIAGNOSIS — M799 Soft tissue disorder, unspecified: Secondary | ICD-10-CM

## 2011-03-12 ENCOUNTER — Other Ambulatory Visit: Payer: Self-pay | Admitting: Family Medicine

## 2011-05-08 ENCOUNTER — Other Ambulatory Visit (INDEPENDENT_AMBULATORY_CARE_PROVIDER_SITE_OTHER): Payer: BC Managed Care – PPO

## 2011-05-08 DIAGNOSIS — Z0000 Encounter for general adult medical examination without abnormal findings: Secondary | ICD-10-CM

## 2011-05-08 LAB — POCT URINALYSIS DIPSTICK
Glucose, UA: NEGATIVE
Leukocytes, UA: NEGATIVE
Nitrite, UA: NEGATIVE
Spec Grav, UA: 1.025
Urobilinogen, UA: 1

## 2011-05-08 LAB — BASIC METABOLIC PANEL
CO2: 26 mEq/L (ref 19–32)
Calcium: 9.6 mg/dL (ref 8.4–10.5)
Chloride: 108 mEq/L (ref 96–112)
Potassium: 4.7 mEq/L (ref 3.5–5.1)
Sodium: 143 mEq/L (ref 135–145)

## 2011-05-08 LAB — CBC WITH DIFFERENTIAL/PLATELET
Basophils Relative: 0.4 % (ref 0.0–3.0)
Eosinophils Absolute: 0.1 10*3/uL (ref 0.0–0.7)
HCT: 37.9 % (ref 36.0–46.0)
Hemoglobin: 12.6 g/dL (ref 12.0–15.0)
Lymphocytes Relative: 26.9 % (ref 12.0–46.0)
Lymphs Abs: 1.7 10*3/uL (ref 0.7–4.0)
MCHC: 33.2 g/dL (ref 30.0–36.0)
Neutro Abs: 4 10*3/uL (ref 1.4–7.7)
RBC: 4.47 Mil/uL (ref 3.87–5.11)

## 2011-05-08 LAB — HEPATIC FUNCTION PANEL
AST: 20 U/L (ref 0–37)
Albumin: 4 g/dL (ref 3.5–5.2)
Alkaline Phosphatase: 76 U/L (ref 39–117)
Total Protein: 7.4 g/dL (ref 6.0–8.3)

## 2011-05-08 LAB — LIPID PANEL: HDL: 57 mg/dL (ref >39.00)

## 2011-05-14 ENCOUNTER — Encounter: Payer: BC Managed Care – PPO | Admitting: Family Medicine

## 2011-05-15 ENCOUNTER — Encounter: Payer: Self-pay | Admitting: Family Medicine

## 2011-05-15 ENCOUNTER — Ambulatory Visit (INDEPENDENT_AMBULATORY_CARE_PROVIDER_SITE_OTHER): Payer: BC Managed Care – PPO | Admitting: Family Medicine

## 2011-05-15 DIAGNOSIS — F411 Generalized anxiety disorder: Secondary | ICD-10-CM

## 2011-05-15 DIAGNOSIS — Z0000 Encounter for general adult medical examination without abnormal findings: Secondary | ICD-10-CM

## 2011-05-15 DIAGNOSIS — F3342 Major depressive disorder, recurrent, in full remission: Secondary | ICD-10-CM

## 2011-05-15 DIAGNOSIS — D510 Vitamin B12 deficiency anemia due to intrinsic factor deficiency: Secondary | ICD-10-CM

## 2011-05-15 DIAGNOSIS — Z23 Encounter for immunization: Secondary | ICD-10-CM

## 2011-05-15 MED ORDER — CITALOPRAM HYDROBROMIDE 40 MG PO TABS: ORAL_TABLET | ORAL | Status: DC

## 2011-05-15 MED ORDER — CYANOCOBALAMIN 1000 MCG/ML IJ SOLN: 1000.0000 ug | INTRAMUSCULAR | Status: DC

## 2011-05-15 MED ORDER — "SYRINGE/NEEDLE (DISP) 25G X 5/8"" 3 ML MISC": 1.0000 | Status: DC

## 2011-05-15 MED ORDER — LORAZEPAM 0.5 MG PO TABS: 0.5000 mg | ORAL_TABLET | Freq: Four times a day (QID) | ORAL | Status: DC | PRN

## 2011-05-15 NOTE — Patient Instructions (Signed)
Continue your current medications  We will get you set up for a screening colonoscopy  Return next January for your annual physical examination sooner if any problems

## 2011-05-15 NOTE — Progress Notes (Signed)
  Subjective:    Patient ID: Erika [name], female    DOB: [birth year], 56 y.o.   MRN: [id number]  HPI  A. is a delightful 56 year old female nonsmoker who comes in today for general physical examination because of a history of depression anxiety and vitamin B12 deficiency  She takes 80 mg of Celexa at bedtime with good control of her depressive symptoms  She takes Ativan 0.5 maybe one tablet a month for anxiety  She takes 1 cc of vitamin B12 monthly for pernicious anemia  She sees her GYN for a Pap smear every year. She's never had trouble with ears and ovaries she is postmenopausal. Current recommendations Paps every 3  She gets routine eye care, not dental care, referred to Dr. Alvester Morin, she does not do breast exam monthly but she does get an annual mammogram. She's never had a colonoscopy.    Review of Systems  Constitutional: Negative.   HENT: Negative.   Eyes: Negative.   Respiratory: Negative.   Cardiovascular: Negative.   Gastrointestinal: Negative.   Genitourinary: Negative.   Musculoskeletal: Negative.   Neurological: Negative.   Hematological: Negative.   Psychiatric/Behavioral: Negative.        Objective:   Physical Exam  Constitutional: She appears well-developed and well-nourished.  HENT:  Head: Normocephalic and atraumatic.  Right Ear: External ear normal.  Left Ear: External ear normal.  Nose: Nose normal.  Mouth/Throat: Oropharynx is clear and moist.  Eyes: EOM are normal. Pupils are equal, round, and reactive to light.  Neck: Normal range of motion. Neck supple. No thyromegaly present.  Cardiovascular: Normal rate, regular rhythm, normal heart sounds and intact distal pulses.  Exam reveals no gallop and no friction rub.   No murmur heard. Pulmonary/Chest: Effort normal and breath sounds normal.  Abdominal: Soft. Bowel sounds are normal. She exhibits no distension and no mass. There is no tenderness. There is no rebound.  Genitourinary: Guaiac negative  stool.       Bilateral breast exam normal  Musculoskeletal: Normal range of motion.  Lymphadenopathy:    She has no cervical adenopathy.  Neurological: She is alert. She has normal reflexes. No cranial nerve deficit. She exhibits normal muscle tone. Coordination normal.  Skin: Skin is warm and dry.  Psychiatric: She has a normal mood and affect. Her behavior is normal. Judgment and thought content normal.          Assessment & Plan:  Healthy female  Depression continue Celexa 80 mg daily  History of B12 deficiency continue B12 1 cc monthly at home  Occasional anxiety 0.5 of Ativan when necessary  Will set up for a screening colonoscopy and advised to get annual mammogram and do BSE monthly.

## 2011-06-14 ENCOUNTER — Encounter (HOSPITAL_COMMUNITY): Payer: Self-pay

## 2011-06-14 ENCOUNTER — Emergency Department (INDEPENDENT_AMBULATORY_CARE_PROVIDER_SITE_OTHER)
Admission: EM | Admit: 2011-06-14 | Discharge: 2011-06-14 | Disposition: A | Discharge status: Home Health | Payer: BC Managed Care – PPO | Source: Home / Self Care | Attending: Family Medicine | Admitting: Family Medicine

## 2011-06-14 DIAGNOSIS — J111 Influenza due to unidentified influenza virus with other respiratory manifestations: Secondary | ICD-10-CM

## 2011-06-14 HISTORY — DX: Anxiety disorder, unspecified: F41.9

## 2011-06-14 HISTORY — DX: Menopausal and female climacteric states: N95.1

## 2011-06-14 MED ORDER — DIPHENOXYLATE-ATROPINE 2.5-0.025 MG PO TABS: 1.0000 | ORAL_TABLET | Freq: Four times a day (QID) | ORAL | Status: DC | PRN

## 2011-06-14 MED ORDER — ONDANSETRON 4 MG PO TBDP
4.0000 mg | ORAL_TABLET | Freq: Once | ORAL | Status: AC
  Administered 2011-06-14: 4 mg via ORAL

## 2011-06-14 MED ORDER — ONDANSETRON HCL 4 MG PO TABS: 4.0000 mg | ORAL_TABLET | Freq: Three times a day (TID) | ORAL | Status: AC | PRN

## 2011-06-14 NOTE — Discharge Instructions (Signed)
Take medications (ondansetron for nausea) as instructed. Consume clear liquids such as water, Sprite, gingerale, light juices for the next 12 to 24 hours, then advance as tolerated to [employer] (bananas, rice, applesauce, toast) and bland things such as soup, crackers, etc. Stop taking medications and return if any blood in stool or any fevers, or you are unable to eat or drink. I recommend aggressive pain and fever control with acetaminophen (Tylenol) and/or ibuprofen. You may use these together, alternating them every 4 hours, or individually, every 8 hours. For example, take acetaminophen 500 to 1000 mg at 12 noon, then 600 to 800 mg of ibuprofen at 4 pm, then acetaminophen at 8 pm, etc. Also, stay hydrated with clear liquids. Return to care should your symptoms not improve, or worsen in any way.

## 2011-06-14 NOTE — ED Notes (Signed)
Pt states she has had flu like sx since Monday- reports feeling better today.  C/o nausea, headache and body aches.  States last had vomiting and diarrhea on Tuesday and last had fever yesterday.

## 2011-06-14 NOTE — ED Provider Notes (Signed)
History     CSN: [id number]  Arrival date & time 06/14/11  4098   First MD Initiated Contact with Patient 06/14/11 [phone]      Chief Complaint  Patient presents with  . Emesis  . Diarrhea  . Generalized Body Aches    (Consider location/radiation/quality/duration/timing/severity/associated sxs/prior treatment) HPI Comments: Adasyn presents for evaluation of fever, body aches, nausea, and poor appetite, and by mouth intolerance since Monday. She reports similar symptoms in her husband, as well. States she had diarrhea and fever. On Tuesday. She also complains of a headache, for she's been taking ibuprofen.  Patient is a 56 y.o. female presenting with vomiting, diarrhea, and flu symptoms. The history is provided by the patient.  Emesis  This is a new problem. The current episode started more than 2 days ago. The problem occurs 2 to 4 times per day. The problem has been gradually improving. The maximum temperature recorded prior to her arrival was 101 to 101.9 F. Associated symptoms include chills, diarrhea, a fever and myalgias.  Diarrhea The primary symptoms include fever, nausea, vomiting, diarrhea and myalgias.  The illness is also significant for chills.  Influenza This is a new problem. The current episode started more than 2 days ago. The problem has been gradually improving. The symptoms are aggravated by nothing. The symptoms are relieved by nothing. She has tried acetaminophen and ASA for the symptoms.    Past Medical History  Diagnosis Date  . Anxiety   . Menopausal syndrome     Past Surgical History  Procedure Date  . Arm surgery     No family history on file.  History  Substance Use Topics  . Smoking status: Former Smoker    Types: Cigarettes    Quit date: 06/01/2000  . Smokeless tobacco: Not on file  . Alcohol Use: Yes    OB History    Grav Para Term Preterm Abortions TAB SAB Ect Mult Living                  Review of Systems  Constitutional: Positive  for fever and chills.  HENT: Negative.   Eyes: Negative.   Respiratory: Negative.   Cardiovascular: Negative.   Gastrointestinal: Positive for nausea, vomiting and diarrhea.  Genitourinary: Negative.   Musculoskeletal: Positive for myalgias.  Skin: Negative.   Neurological: Negative.     Allergies  Indomethacin; Lisinopril-hydrochlorothiazide; and Tetracycline hcl  Home Medications   Current Outpatient Rx  Name Route Sig Dispense Refill  . CITALOPRAM HYDROBROMIDE 40 MG PO TABS  2 tabs daily at bedtime 180 tablet 3  . CYANOCOBALAMIN 1000 MCG/ML IJ SOLN Intramuscular Inject 1 mL (1,000 mcg total) into the muscle every 30 (thirty) days. 30 mL 0    PATIENTS WANTS BOTTLES  . LORAZEPAM 0.5 MG PO TABS Oral Take 1 tablet (0.5 mg total) by mouth every 6 (six) hours as needed. 30 tablet 1  . SYRINGE/NEEDLE (DISP) 25G X 5/8" 3 ML MISC Does not apply 1 Syringe by Does not apply route every 30 (thirty) days. Use for b12 injections 12 each 1  . ONDANSETRON HCL 4 MG PO TABS Oral Take 1 tablet (4 mg total) by mouth every 8 (eight) hours as needed for nausea. 15 tablet 0    BP 107/70  Pulse 72  Temp(Src) 98.3 F (36.8 C) (Oral)  Resp 16  SpO2 100%  Physical Exam  Nursing note and vitals reviewed. Constitutional: She is oriented to person, place, and time. She appears well-developed  and well-nourished.  HENT:  Head: Normocephalic and atraumatic.  Right Ear: Tympanic membrane normal.  Left Ear: Tympanic membrane normal.  Mouth/Throat: Uvula is midline, oropharynx is clear and moist and mucous membranes are normal.  Eyes: EOM are normal.  Neck: Normal range of motion.  Pulmonary/Chest: Effort normal and breath sounds normal. She has no decreased breath sounds. She has no wheezes. She has no rales.  Musculoskeletal: Normal range of motion.  Neurological: She is alert and oriented to person, place, and time.  Skin: Skin is warm and dry.  Psychiatric: Her behavior is normal.    ED  Course  Procedures (including critical care time)  Labs Reviewed - No data to display No results found.   1. Influenza-like illness       MDM  Continue supportive care; given rx for ondansetron        Richardo Priest, MD 06/14/11 1054

## 2011-06-22 ENCOUNTER — Telehealth: Payer: Self-pay | Admitting: Family Medicine

## 2011-06-22 DIAGNOSIS — Z0279 Encounter for issue of other medical certificate: Secondary | ICD-10-CM

## 2011-08-23 ENCOUNTER — Ambulatory Visit (INDEPENDENT_AMBULATORY_CARE_PROVIDER_SITE_OTHER): Payer: BC Managed Care – PPO | Admitting: Family Medicine

## 2011-08-23 ENCOUNTER — Encounter: Payer: Self-pay | Admitting: Family Medicine

## 2011-08-23 DIAGNOSIS — L918 Other hypertrophic disorders of the skin: Secondary | ICD-10-CM | POA: Insufficient documentation

## 2011-08-23 DIAGNOSIS — L919 Hypertrophic disorder of the skin, unspecified: Secondary | ICD-10-CM

## 2011-08-23 DIAGNOSIS — L909 Atrophic disorder of skin, unspecified: Secondary | ICD-10-CM

## 2011-08-23 NOTE — Progress Notes (Signed)
  Subjective:    Patient ID: Erika [name], female    DOB: 1956-01-23, 56 y.o.   MRN: [id number]  HPI Erika [name] is a delightful 56 year old female who comes in today for removal of one skin tag  After informed consent the lesion was anesthetized with 1% Xylocaine with epinephrine snip.the bases cauterized Band-Aids was applied she tolerated procedure no complications   Review of Systems    negative Objective:   Physical Exam  Procedure see above      Assessment & Plan:  Skin tag removed gratis

## 2011-08-23 NOTE — Patient Instructions (Signed)
Return when necessary 

## 2011-09-13 ENCOUNTER — Telehealth: Payer: Self-pay | Admitting: Family Medicine

## 2011-09-13 DIAGNOSIS — F411 Generalized anxiety disorder: Secondary | ICD-10-CM

## 2011-09-13 NOTE — Telephone Encounter (Signed)
Spoke with patient.

## 2011-09-13 NOTE — Telephone Encounter (Signed)
Pulled from triage vmail - pt called at 8:44. Stated she received a call yesterday, and is returning it. She can be reached at her work #.

## 2011-09-14 MED ORDER — LORAZEPAM 0.5 MG PO TABS: 0.5000 mg | ORAL_TABLET | Freq: Four times a day (QID) | ORAL | Status: DC | PRN

## 2012-01-14 ENCOUNTER — Encounter: Payer: Self-pay | Admitting: Family Medicine

## 2012-01-14 ENCOUNTER — Ambulatory Visit (INDEPENDENT_AMBULATORY_CARE_PROVIDER_SITE_OTHER): Payer: BC Managed Care – PPO | Admitting: Family Medicine

## 2012-01-14 VITALS — BP 160/100 | Temp 98.0°F | Wt 214.0 lb

## 2012-01-14 DIAGNOSIS — D510 Vitamin B12 deficiency anemia due to intrinsic factor deficiency: Secondary | ICD-10-CM

## 2012-01-14 DIAGNOSIS — J309 Allergic rhinitis, unspecified: Secondary | ICD-10-CM

## 2012-01-14 DIAGNOSIS — J45909 Unspecified asthma, uncomplicated: Secondary | ICD-10-CM

## 2012-01-14 DIAGNOSIS — R5383 Other fatigue: Secondary | ICD-10-CM

## 2012-01-14 DIAGNOSIS — R5381 Other malaise: Secondary | ICD-10-CM

## 2012-01-14 LAB — CBC WITH DIFFERENTIAL/PLATELET
Eosinophils Absolute: 0.2 10*3/uL (ref 0.0–0.7)
Eosinophils Relative: 1.3 % (ref 0.0–5.0)
HCT: 40.4 % (ref 36.0–46.0)
Lymphs Abs: 3.5 10*3/uL (ref 0.7–4.0)
MCHC: 32.2 g/dL (ref 30.0–36.0)
MCV: 85 fl (ref 78.0–100.0)
Monocytes Absolute: 0.5 10*3/uL (ref 0.1–1.0)
Neutrophils Relative %: 63.6 % (ref 43.0–77.0)
Platelets: 362 10*3/uL (ref 150.0–400.0)
RDW: 14.7 % — ABNORMAL HIGH (ref 11.5–14.6)
WBC: 11.6 10*3/uL — ABNORMAL HIGH (ref 4.5–10.5)

## 2012-01-14 LAB — BASIC METABOLIC PANEL
CO2: 26 mEq/L (ref 19–32)
Calcium: 8.8 mg/dL (ref 8.4–10.5)
Creatinine, Ser: 0.8 mg/dL (ref 0.4–1.2)
GFR: 82.11 mL/min (ref >60.00)
Sodium: 138 mEq/L (ref 135–145)

## 2012-01-14 LAB — TSH: TSH: 5.34 u[IU]/mL (ref 0.35–5.50)

## 2012-01-14 MED ORDER — BECLOMETHASONE DIPROPIONATE 40 MCG/ACT IN AERS: 2.0000 | INHALATION_SPRAY | Freq: Two times a day (BID) | RESPIRATORY_TRACT | Status: DC

## 2012-01-14 NOTE — Progress Notes (Signed)
  Subjective:    Patient ID: Erika [name], female    DOB: 03-18-1956, 56 y.o.   MRN: [id number]  HPI Nikyla is a 56 year old married female nonsmoker who comes in today with a week's history of not feeling well. She states last week her husband had a cold and she had some cold and/or allergy-type symptoms head congestion postnasal drip and a little cough. She took 40 mg of prednisone daily starting Tuesday for 3 days thinking it was a flareup of her asthma but then stopped. On Friday Saturday Sunday she took some pseudoephedrine because of her symptoms. She feels today tired no energy fatigue coughing. Also during the weekend her blood pressure dropped to 103/66 and now is 160/100.   Review of Systems    general review of systems otherwise negative Objective:   Physical Exam Well-developed well-nourished female no acute distress HEENT negative neck was supple no adenopathy thyroid not enlarged chest screw to auscultation cardiac exam normal       Assessment & Plan:  Symptoms consistent with allergic rhinitis treat symptomatically with antihistamine  Asthma begin Qvar 40 one puff twice a day  Fatigue check labs

## 2012-01-14 NOTE — Patient Instructions (Signed)
Do not take any of the pseudoephedrine  Take a plain Zyrtec at bedtime 10 mg  Qvar dose 2 puffs twice daily for your wheezing  Labs today  Return on Thursday for followup

## 2012-01-17 ENCOUNTER — Ambulatory Visit (INDEPENDENT_AMBULATORY_CARE_PROVIDER_SITE_OTHER): Payer: BC Managed Care – PPO | Admitting: Family Medicine

## 2012-01-17 ENCOUNTER — Encounter: Payer: Self-pay | Admitting: Family Medicine

## 2012-01-17 VITALS — BP 120/80 | Temp 98.0°F | Wt 212.0 lb

## 2012-01-17 DIAGNOSIS — J069 Acute upper respiratory infection, unspecified: Secondary | ICD-10-CM

## 2012-01-17 DIAGNOSIS — J45901 Unspecified asthma with (acute) exacerbation: Secondary | ICD-10-CM

## 2012-01-17 DIAGNOSIS — J45909 Unspecified asthma, uncomplicated: Secondary | ICD-10-CM

## 2012-01-17 MED ORDER — PREDNISONE 20 MG PO TABS: ORAL_TABLET | ORAL | Status: DC

## 2012-01-17 MED ORDER — HYDROCODONE-HOMATROPINE 5-1.5 MG/5ML PO SYRP: 5.0000 mL | ORAL_SOLUTION | Freq: Four times a day (QID) | ORAL | Status: AC | PRN

## 2012-01-17 NOTE — Patient Instructions (Addendum)
Drink lots of water  Take the prednisone as directed  Hydromet 1/2-1 teaspoon at bedtime when necessary for cough  Return next Tuesday for followup  Stop the inhaler

## 2012-01-17 NOTE — Progress Notes (Signed)
  Subjective:    Patient ID: Erika [name], female    DOB: 03-01-1956, 56 y.o.   MRN: [id number]  HPI Erika [name] is a 56 year old female nonsmoker who comes in today for followup of allergic rhinitis and wheezing and coughing  We saw her the first week and she had a flare of her allergies and asthma. We started her on Qvar 40 dose 2 puffs twice a day. She comes back today saying she feels no better. She feels weak sneezing head congestion coughing no fever no sputum production.   Review of Systems    general and pulmonary review of systems otherwise negative Objective:   Physical Exam Well-developed and nourished female no acute distress HEENT negative neck was supple no adenopathy lungs are clear no wheezing Cardiac exam normal      Assessment & Plan:

## 2012-01-22 ENCOUNTER — Ambulatory Visit (INDEPENDENT_AMBULATORY_CARE_PROVIDER_SITE_OTHER): Payer: BC Managed Care – PPO | Admitting: Family Medicine

## 2012-01-22 ENCOUNTER — Encounter: Payer: Self-pay | Admitting: Family Medicine

## 2012-01-22 VITALS — BP 122/78 | HR 74 | Temp 97.8°F | Resp 18 | Wt 212.0 lb

## 2012-01-22 DIAGNOSIS — J45909 Unspecified asthma, uncomplicated: Secondary | ICD-10-CM

## 2012-01-22 NOTE — Patient Instructions (Signed)
Prednisone 1 tab x5 days, a half a tab x5 days, then a half a tablet Monday Wednesday Friday for a 3 week taper

## 2012-01-22 NOTE — Progress Notes (Signed)
  Subjective:    Patient ID: Erika [name], female    DOB: 05-17-1955, 56 y.o.   MRN: [id number]  HPI Erika [name] is a 56 year old female who comes in for followup of RAD  She dropped her prednisone down to 20 mg a day today and feels about 75% better. No side effects from the medication   Review of Systems General and pulmonary review of systems otherwise negative    Objective:   Physical Exam  Well-developed well-nourished female in no acute distress lungs were clear to auscultation no wheezing      Assessment & Plan:  Reactive airway disease taper prednisone slowly

## 2012-06-05 ENCOUNTER — Other Ambulatory Visit (INDEPENDENT_AMBULATORY_CARE_PROVIDER_SITE_OTHER): Payer: BC Managed Care – PPO

## 2012-06-05 DIAGNOSIS — Z0000 Encounter for general adult medical examination without abnormal findings: Secondary | ICD-10-CM

## 2012-06-05 LAB — HEPATIC FUNCTION PANEL
ALT: 18 U/L (ref 0–35)
AST: 17 U/L (ref 0–37)
Bilirubin, Direct: 0 mg/dL (ref 0.0–0.3)
Total Bilirubin: 0.4 mg/dL (ref 0.3–1.2)
Total Protein: 7.1 g/dL (ref 6.0–8.3)

## 2012-06-05 LAB — BASIC METABOLIC PANEL
BUN: 13 mg/dL (ref 6–23)
Chloride: 105 mEq/L (ref 96–112)
Creatinine, Ser: 0.6 mg/dL (ref 0.4–1.2)
GFR: 101.5 mL/min (ref >60.00)
Potassium: 4.9 mEq/L (ref 3.5–5.1)

## 2012-06-05 LAB — LIPID PANEL
HDL: 57.4 mg/dL (ref >39.00)
Total CHOL/HDL Ratio: 4
VLDL: 24.2 mg/dL (ref 0.0–40.0)

## 2012-06-05 LAB — POCT URINALYSIS DIPSTICK
Bilirubin, UA: NEGATIVE
Blood, UA: NEGATIVE
Ketones, UA: NEGATIVE
Protein, UA: NEGATIVE
Spec Grav, UA: 1.01
pH, UA: 7

## 2012-06-05 LAB — CBC WITH DIFFERENTIAL/PLATELET
Basophils Relative: 0.3 % (ref 0.0–3.0)
Eosinophils Relative: 0.7 % (ref 0.0–5.0)
HCT: 36.9 % (ref 36.0–46.0)
MCV: 82.4 fl (ref 78.0–100.0)
Monocytes Relative: 5.9 % (ref 3.0–12.0)
Neutrophils Relative %: 63.1 % (ref 43.0–77.0)
Platelets: 301 10*3/uL (ref 150.0–400.0)
RBC: 4.47 Mil/uL (ref 3.87–5.11)
WBC: 6.6 10*3/uL (ref 4.5–10.5)

## 2012-06-05 LAB — LDL CHOLESTEROL, DIRECT: Direct LDL: 124.8 mg/dL

## 2012-06-12 ENCOUNTER — Encounter: Payer: BC Managed Care – PPO | Admitting: Family Medicine

## 2012-06-17 ENCOUNTER — Ambulatory Visit (INDEPENDENT_AMBULATORY_CARE_PROVIDER_SITE_OTHER): Payer: BC Managed Care – PPO | Admitting: Family Medicine

## 2012-06-17 ENCOUNTER — Encounter: Payer: Self-pay | Admitting: Family Medicine

## 2012-06-17 VITALS — BP 124/70 | HR 82 | Temp 98.5°F

## 2012-06-17 DIAGNOSIS — J329 Chronic sinusitis, unspecified: Secondary | ICD-10-CM

## 2012-06-17 MED ORDER — AMOXICILLIN 875 MG PO TABS: 875.0000 mg | ORAL_TABLET | Freq: Two times a day (BID) | ORAL | Status: DC

## 2012-06-17 MED ORDER — HYDROCODONE-HOMATROPINE 5-1.5 MG/5ML PO SYRP: 5.0000 mL | ORAL_SOLUTION | Freq: Three times a day (TID) | ORAL | Status: DC | PRN

## 2012-06-17 NOTE — Progress Notes (Signed)
Chief Complaint  Patient presents with  . Cough    congestion, headache, right ear pain, sore throat, facial pressure     HPI:  -started: 2 weeks ago -symptoms:nasal congestion, sore throat, R max tooth pain,  cough, drainage, sinus pain and pressure, R ear pain, fatigue -denies:fever, SOB, NVD -has tried: zyrtec -sick contacts: husband was sick but he got better after abx -Hx of: sinusitis -no allergies to any abx   ROS: See pertinent positives and negatives per HPI.  Past Medical History  Diagnosis Date  . Anxiety   . Menopausal syndrome     No family history on file.  History   Social History  . Marital Status: Married    Spouse Name: N/A    Number of Children: N/A  . Years of Education: N/A   Social History Main Topics  . Smoking status: Former Smoker    Types: Cigarettes    Quit date: 06/01/2000  . Smokeless tobacco: None  . Alcohol Use: Yes  . Drug Use: No  . Sexually Active:    Other Topics Concern  . None   Social History Narrative  . None    Current outpatient prescriptions:beclomethasone (QVAR) 40 MCG/ACT inhaler, Inhale 2 puffs into the lungs 2 (two) times daily., Disp: 1 Inhaler, Rfl: 12;  citalopram (CELEXA) 40 MG tablet, 2 tabs daily at bedtime, Disp: 180 tablet, Rfl: 3;  cyanocobalamin (,VITAMIN B-12,) 1000 MCG/ML injection, Inject 1 mL (1,000 mcg total) into the muscle every 30 (thirty) days., Disp: 30 mL, Rfl: 0 LORazepam (ATIVAN) 0.5 MG tablet, Take 1 tablet (0.5 mg total) by mouth every 6 (six) hours as needed., Disp: 30 tablet, Rfl: 1;  SYRINGE-NEEDLE, DISP, 3 ML (B-D SYRINGE/NEEDLE 3CC/25GX5/8) 25G X 5/8" 3 ML MISC, 1 Syringe by Does not apply route every 30 (thirty) days. Use for b12 injections, Disp: 12 each, Rfl: 1;  amoxicillin (AMOXIL) 875 MG tablet, Take 1 tablet (875 mg total) by mouth 2 (two) times daily., Disp: 20 tablet, Rfl: 0 HYDROcodone-homatropine (HYCODAN) 5-1.5 MG/5ML syrup, Take 5 mLs by mouth every 8 (eight) hours as needed  for cough., Disp: 120 mL, Rfl: 0;  predniSONE (DELTASONE) 20 MG tablet, 2 tabs x5 days, 1 tab x5 days, a half a tab x5 days, then a half a tablet Monday Wednesday Friday for a 2 week taper, Disp: 40 tablet, Rfl: 1  EXAM:  Filed Vitals:   06/17/12 1038  BP: 124/70  Pulse: 82  Temp: 98.5 F (36.9 C)    Body mass index is 0.00 kg/(m^2).  GENERAL: vitals reviewed and listed above, alert, oriented, appears well hydrated and in no acute distress  HEENT: atraumatic, conjunttiva clear, no obvious abnormalities on inspection of external nose and ears, normal appearance of ear canals and TMs, clear nasal congestion, mild post oropharyngeal erythema with PND, no tonsillar edema or exudate, R max sinusTTP  NECK: no obvious masses on inspection  LUNGS: clear to auscultation bilaterally, no wheezes, rales or rhonchi, good air movement  CV: HRRR, no peripheral edema  MS: moves all extremities without noticeable abnormality  PSYCH: pleasant and cooperative, no obvious depression or anxiety  ASSESSMENT AND PLAN:  Discussed the following assessment and plan:  Sinusitis - Plan: amoxicillin (AMOXIL) 875 MG tablet, HYDROcodone-homatropine (HYCODAN) 5-1.5 MG/5ML syrup  -amox and cough medication - risks discussed -Patient advised to return or notify a doctor immediately if symptoms worsen or persist or new concerns arise.  Patient Instructions  -As we discussed, we have prescribed a  new medication for you at this appointment. We discussed the common and serious potential adverse effects of this medication and you can review these and more with the pharmacist when you pick up your medication.  Please follow the instructions for use carefully and notify us immediately if you have any problems taking this medication.  INSTRUCTIONS FOR UPPER RESPIRATORY INFECTION:  -plenty of rest and fluids  -nasal saline wash 2-3 times daily (use prepackaged nasal saline or bottled/distilled water if making your  own)   -can use tylenol or ibuprofen as directed for aches and sorethroat  -in the winter time, using a humidifier at night is helpful (please follow cleaning instructions)  -if you are taking a cough medication - use only as directed, may also try a teaspoon of honey to coat the throat and throat lozenges  -for sore throat, salt water gargles can help  -follow up if you have fevers, facial pain, tooth pain, difficulty breathing or are worsening or not getting better in 5-7 days      Datrell Dunton R.

## 2012-06-17 NOTE — Patient Instructions (Addendum)
-  As we discussed, we have prescribed a new medication for you at this appointment. We discussed the common and serious potential adverse effects of this medication and you can review these and more with the pharmacist when you pick up your medication.  Please follow the instructions for use carefully and notify us immediately if you have any problems taking this medication.  INSTRUCTIONS FOR UPPER RESPIRATORY INFECTION:  -plenty of rest and fluids  -nasal saline wash 2-3 times daily (use prepackaged nasal saline or bottled/distilled water if making your own)   -can use tylenol or ibuprofen as directed for aches and sorethroat  -in the winter time, using a humidifier at night is helpful (please follow cleaning instructions)  -if you are taking a cough medication - use only as directed, may also try a teaspoon of honey to coat the throat and throat lozenges  -for sore throat, salt water gargles can help  -follow up if you have fevers, facial pain, tooth pain, difficulty breathing or are worsening or not getting better in 5-7 days

## 2012-07-08 ENCOUNTER — Encounter: Payer: BC Managed Care – PPO | Admitting: Family Medicine

## 2012-07-18 ENCOUNTER — Other Ambulatory Visit: Payer: Self-pay | Admitting: Family Medicine

## 2012-08-04 ENCOUNTER — Other Ambulatory Visit: Payer: Self-pay | Admitting: Family Medicine

## 2012-08-26 ENCOUNTER — Encounter: Payer: BC Managed Care – PPO | Admitting: Family Medicine

## 2012-12-19 ENCOUNTER — Encounter: Payer: Self-pay | Admitting: Internal Medicine

## 2013-02-05 ENCOUNTER — Ambulatory Visit (INDEPENDENT_AMBULATORY_CARE_PROVIDER_SITE_OTHER): Payer: BC Managed Care – PPO | Admitting: *Deleted

## 2013-02-05 DIAGNOSIS — Z23 Encounter for immunization: Secondary | ICD-10-CM

## 2013-02-10 ENCOUNTER — Encounter: Payer: Self-pay | Admitting: Internal Medicine

## 2013-02-10 ENCOUNTER — Ambulatory Visit (AMBULATORY_SURGERY_CENTER): Payer: Self-pay

## 2013-02-10 VITALS — Ht 66.0 in | Wt 216.6 lb

## 2013-02-10 DIAGNOSIS — Z1211 Encounter for screening for malignant neoplasm of colon: Secondary | ICD-10-CM

## 2013-02-10 MED ORDER — MOVIPREP 100 G PO SOLR: ORAL | Status: DC

## 2013-02-23 ENCOUNTER — Encounter: Payer: Self-pay | Admitting: Internal Medicine

## 2013-02-23 ENCOUNTER — Ambulatory Visit (AMBULATORY_SURGERY_CENTER): Payer: BC Managed Care – PPO | Admitting: Internal Medicine

## 2013-02-23 VITALS — BP 139/76 | HR 59 | Temp 98.0°F | Resp 19 | Ht 66.0 in | Wt 216.0 lb

## 2013-02-23 DIAGNOSIS — Z1211 Encounter for screening for malignant neoplasm of colon: Secondary | ICD-10-CM

## 2013-02-23 DIAGNOSIS — D126 Benign neoplasm of colon, unspecified: Secondary | ICD-10-CM

## 2013-02-23 MED ORDER — SODIUM CHLORIDE 0.9 % IV SOLN: 500.0000 mL | INTRAVENOUS | Status: DC

## 2013-02-23 NOTE — Patient Instructions (Addendum)
YOU HAD AN ENDOSCOPIC PROCEDURE TODAY AT THE [facility]: Refer to the procedure report that was given to you for any specific questions about what was found during the examination.  If the procedure report does not answer your questions, please call your gastroenterologist to clarify.  If you requested that your care partner not be given the details of your procedure findings, then the procedure report has been included in a sealed envelope for you to review at your convenience later.  YOU SHOULD EXPECT: Some feelings of bloating in the abdomen. Passage of more gas than usual.  Walking can help get rid of the air that was put into your GI tract during the procedure and reduce the bloating. If you had a lower endoscopy (such as a colonoscopy or flexible sigmoidoscopy) you may notice spotting of blood in your stool or on the toilet paper. If you underwent a bowel prep for your procedure, then you may not have a normal bowel movement for a few days.  DIET: Your first meal following the procedure should be a light meal and then it is ok to progress to your normal diet.  A half-sandwich or bowl of soup is an example of a good first meal.  Heavy or fried foods are harder to digest and may make you feel nauseous or bloated.  Likewise meals heavy in dairy and vegetables can cause extra gas to form and this can also increase the bloating.  Drink plenty of fluids but you should avoid alcoholic beverages for 24 hours.  ACTIVITY: Your care partner should take you home directly after the procedure.  You should plan to take it easy, moving slowly for the rest of the day.  You can resume normal activity the day after the procedure however you should NOT DRIVE or use heavy machinery for 24 hours (because of the sedation medicines used during the test).    SYMPTOMS TO REPORT IMMEDIATELY: A gastroenterologist can be reached at any hour.  During normal business hours, 8:30 AM to 5:00 PM Monday through Friday,  call [phone].  After hours and on weekends, please call the GI answering service at [phone] who will take a message and have the physician on call contact you.   Following lower endoscopy (colonoscopy or flexible sigmoidoscopy):  Excessive amounts of blood in the stool  Significant tenderness or worsening of abdominal pains  Swelling of the abdomen that is new, acute  Fever of 100F or higher   FOLLOW UP: If any biopsies were taken you will be contacted by phone or by letter within the next 1-3 weeks.  Call your gastroenterologist if you have not heard about the biopsies in 3 weeks.  Our staff will call the home number listed on your records the next business day following your procedure to check on you and address any questions or concerns that you may have at that time regarding the information given to you following your procedure. This is a courtesy call and so if there is no answer at the home number and we have not heard from you through the emergency physician on call, we will assume that you have returned to your regular daily activities without incident.  SIGNATURES/CONFIDENTIALITY: You and/or your care partner have signed paperwork which will be entered into your electronic medical record.  These signatures attest to the fact that that the information above on your After Visit Summary has been reviewed and is understood.  Full responsibility of the confidentiality of   this discharge information lies with you and/or your care-partner.   Information on polyps given to you today.  Dr. Marina Goodell will advise you about next colonoscopy after pathology is reviewed.

## 2013-02-23 NOTE — Progress Notes (Signed)
Called to room to assist during endoscopic procedure.  Patient ID and intended procedure confirmed with present staff. Received instructions for my participation in the procedure from the performing physician.  

## 2013-02-23 NOTE — Op Note (Signed)
[facility] [address] [place], [address]   COLONOSCOPY PROCEDURE REPORT  PATIENT: Erika [name], Erika [name]  MR#: [id number] BIRTHDATE: 1955/04/18 , 57  yrs. old GENDER: Female ENDOSCOPIST: Roxy Cedar, MD REFERRED DG:LOVFIEP Shawnie Dapper, M.D. PROCEDURE DATE:  02/23/2013 PROCEDURE:   Colonoscopy with snare polypectomy x 2 First Screening Colonoscopy - Avg.  risk and is 50 yrs.  old or older Yes.  Prior Negative Screening - Now for repeat screening. N/A  History of Adenoma - Now for follow-up colonoscopy & has been > or = to 3 yrs.  N/A  Polyps Removed Today? Yes. ASA CLASS:   Class II INDICATIONS:average risk screening. MEDICATIONS: MAC sedation, administered by CRNA and propofol (Diprivan) 260mg  IV  DESCRIPTION OF PROCEDURE:   After the risks benefits and alternatives of the procedure were thoroughly explained, informed consent was obtained.  A digital rectal exam revealed no abnormalities of the rectum.   The LB [id number]-[id number]  endoscope was introduced through the anus and advanced to the cecum, which was identified by both the appendix and ileocecal valve. No adverse events experienced.   The quality of the prep was excellent, using MoviPrep  The instrument was then slowly withdrawn as the colon was fully examined.   COLON FINDINGS: Two diminutive polyps were found in the transverse colon and sigmoid colon.  A polypectomy was performed with a cold snare.  The resection was complete and the polyp tissue was completely retrieved.   The colon was otherwise normal.  There was no diverticulosis, inflammation, other polyps or cancers . Retroflexed views revealed internal hemorrhoids. The time to cecum=3 minutes 11 seconds.  Withdrawal time=9 minutes 21 seconds. The scope was withdrawn and the procedure completed. COMPLICATIONS: There were no complications.  ENDOSCOPIC IMPRESSION: 1.   Two diminutive polyps were found in the colon; polypectomy was performed with  a cold snare 2.   The colon was otherwise normal  RECOMMENDATIONS: 1. Repeat colonoscopy in 5 years if polyp adenomatous; otherwise 10 years   eSigned:  Roxy Cedar, MD 02/23/2013 11:49 AM   cc: [name], MD and The Patient

## 2013-02-23 NOTE — Progress Notes (Signed)
Procedure ends, to recovery, report given and VSS. 

## 2013-02-23 NOTE — Progress Notes (Signed)
Patient did not experience any of the following events: a burn prior to discharge; a fall within the facility; wrong site/side/patient/procedure/implant event; or a hospital transfer or hospital admission upon discharge from the facility. (G8907) Patient did not have preoperative order for IV antibiotic SSI prophylaxis. (G8918)  

## 2013-02-24 ENCOUNTER — Telehealth: Payer: Self-pay | Admitting: *Deleted

## 2013-02-24 NOTE — Telephone Encounter (Signed)
  Follow up Call-  Call back number 02/23/2013  Post procedure Call Back phone  # [phone]  Permission to leave phone message Yes     Patient questions:  Do you have a fever, pain , or abdominal swelling? no Pain Score  0 *  Have you tolerated food without any problems? yes  Have you been able to return to your normal activities? yes  Do you have any questions about your discharge instructions: Diet   no Medications  no Follow up visit  no  Do you have questions or concerns about your Care? no   Actions:  * If pain score is 4 or above: No action needed, pain <4.

## 2013-03-04 ENCOUNTER — Encounter: Payer: Self-pay | Admitting: Internal Medicine

## 2013-03-18 ENCOUNTER — Other Ambulatory Visit (INDEPENDENT_AMBULATORY_CARE_PROVIDER_SITE_OTHER): Payer: BC Managed Care – PPO

## 2013-03-18 DIAGNOSIS — Z0000 Encounter for general adult medical examination without abnormal findings: Secondary | ICD-10-CM

## 2013-03-18 LAB — BASIC METABOLIC PANEL
BUN: 14 mg/dL (ref 6–23)
Chloride: 107 mEq/L (ref 96–112)
Creatinine, Ser: 0.6 mg/dL (ref 0.4–1.2)
GFR: 115.69 mL/min (ref >60.00)
Potassium: 4.3 mEq/L (ref 3.5–5.1)

## 2013-03-18 LAB — CBC WITH DIFFERENTIAL/PLATELET
Basophils Relative: 0.5 % (ref 0.0–3.0)
Eosinophils Relative: 0.8 % (ref 0.0–5.0)
Hemoglobin: 11.8 g/dL — ABNORMAL LOW (ref 12.0–15.0)
Lymphocytes Relative: 30.2 % (ref 12.0–46.0)
MCV: 82.4 fl (ref 78.0–100.0)
Monocytes Absolute: 0.3 10*3/uL (ref 0.1–1.0)
Monocytes Relative: 4.9 % (ref 3.0–12.0)
Neutrophils Relative %: 63.6 % (ref 43.0–77.0)
RBC: 4.24 Mil/uL (ref 3.87–5.11)
WBC: 6.7 10*3/uL (ref 4.5–10.5)

## 2013-03-18 LAB — LIPID PANEL
HDL: 50 mg/dL (ref >39.00)
Total CHOL/HDL Ratio: 4
VLDL: 24.2 mg/dL (ref 0.0–40.0)

## 2013-03-18 LAB — POCT URINALYSIS DIPSTICK
Bilirubin, UA: NEGATIVE
Ketones, UA: NEGATIVE
Leukocytes, UA: NEGATIVE
Protein, UA: NEGATIVE
Spec Grav, UA: 1.02
Urobilinogen, UA: 0.2
pH, UA: 6

## 2013-03-18 LAB — HEPATIC FUNCTION PANEL
ALT: 16 U/L (ref 0–35)
AST: 16 U/L (ref 0–37)
Alkaline Phosphatase: 68 U/L (ref 39–117)
Bilirubin, Direct: 0 mg/dL (ref 0.0–0.3)
Total Protein: 7 g/dL (ref 6.0–8.3)

## 2013-03-18 LAB — LDL CHOLESTEROL, DIRECT: Direct LDL: 151.7 mg/dL

## 2013-03-25 ENCOUNTER — Encounter: Payer: Self-pay | Admitting: Family Medicine

## 2013-03-25 ENCOUNTER — Ambulatory Visit (INDEPENDENT_AMBULATORY_CARE_PROVIDER_SITE_OTHER): Payer: BC Managed Care – PPO | Admitting: Family Medicine

## 2013-03-25 VITALS — BP 148/98 | Temp 97.9°F | Ht 66.5 in | Wt 217.0 lb

## 2013-03-25 DIAGNOSIS — Z01419 Encounter for gynecological examination (general) (routine) without abnormal findings: Secondary | ICD-10-CM

## 2013-03-25 DIAGNOSIS — F3342 Major depressive disorder, recurrent, in full remission: Secondary | ICD-10-CM

## 2013-03-25 DIAGNOSIS — Z0000 Encounter for general adult medical examination without abnormal findings: Secondary | ICD-10-CM

## 2013-03-25 DIAGNOSIS — R413 Other amnesia: Secondary | ICD-10-CM

## 2013-03-25 DIAGNOSIS — I119 Hypertensive heart disease without heart failure: Secondary | ICD-10-CM

## 2013-03-25 DIAGNOSIS — D510 Vitamin B12 deficiency anemia due to intrinsic factor deficiency: Secondary | ICD-10-CM

## 2013-03-25 MED ORDER — CITALOPRAM HYDROBROMIDE 40 MG PO TABS: ORAL_TABLET | ORAL | Status: DC

## 2013-03-25 NOTE — Patient Instructions (Signed)
We will check your B12 level today  Continue the Celexa  Motrin 600 mg twice daily with food for joint pain  Walk 30 minutes daily  Check a blood pressure daily in the morning and return in one month for followup with the data and the device  Avoid salt  If you knee continues to hurt I would recommend a consult with Dr. Norlene Campbell orthopedist

## 2013-03-25 NOTE — Progress Notes (Signed)
Pre visit review using our clinic review tool, if applicable. No additional management support is needed unless otherwise documented below in the visit note. 

## 2013-03-25 NOTE — Progress Notes (Signed)
   Subjective:    Patient ID: Erika [name], female    DOB: 03/26/1956, 57 y.o.   MRN: [id number]  HPI Erika [name] is a 57 year old female nonsmoker who comes in today for general physical examination  She's had a history of mild depression and been on Celexa 40 mg daily. 6 she's a history of B12 deficiency but she's not been taking her B12 shots  She has some osteoarthritis for which she takes Motrin 600 mg twice a day  Also has a history of hypertension. It's however in the past her blood pressures at home and been normal. BP here today 140/98  She's also complaining of some memory loss. She's 8 years postmenopausal.  She gets routine eye care but not dental care. She had a colonoscopy recently showed 3 polyps. Also this year she had foot surgery and she's having some soreness in her left knee.   Review of Systems  Constitutional: Negative.   HENT: Negative.   Eyes: Negative.   Respiratory: Negative.   Cardiovascular: Negative.   Gastrointestinal: Negative.   Genitourinary: Negative.   Musculoskeletal: Negative.   Neurological: Negative.   Psychiatric/Behavioral: Negative.        Objective:   Physical Exam  Constitutional: She appears well-developed and well-nourished.  HENT:  Head: Normocephalic and atraumatic.  Right Ear: External ear normal.  Left Ear: External ear normal.  Nose: Nose normal.  Mouth/Throat: Oropharynx is clear and moist.  Eyes: EOM are normal. Pupils are equal, round, and reactive to light.  Neck: Normal range of motion. Neck supple. No thyromegaly present.  Cardiovascular: Normal rate, regular rhythm, normal heart sounds and intact distal pulses.  Exam reveals no gallop and no friction rub.   No murmur heard. Pulmonary/Chest: Effort normal and breath sounds normal.  Abdominal: Soft. Bowel sounds are normal. She exhibits no distension and no mass. There is no tenderness. There is no rebound.  Genitourinary: Vagina normal and uterus normal. Guaiac  negative stool. No vaginal discharge found.  Bilateral breast exam normal  Musculoskeletal: Normal range of motion.  Lymphadenopathy:    She has no cervical adenopathy.  Neurological: She is alert. She has normal reflexes. No cranial nerve deficit. She exhibits normal muscle tone. Coordination normal.  Skin: Skin is warm and dry.  Psychiatric: She has a normal mood and affect. Her behavior is normal. Judgment and thought content normal.          Assessment & Plan:  Healthy female  History of mild depression continue Celexa  Osteoarthritis Motrin 600 twice a day  Memory loss discussed neurologic consult  History of B12 deficiency check B12 level  Status post left foot surgery  Slightly overweight 217 pounds recommend diet exercise and weight loss

## 2013-03-27 ENCOUNTER — Other Ambulatory Visit: Payer: Self-pay | Admitting: Family Medicine

## 2013-03-27 DIAGNOSIS — E538 Deficiency of other specified B group vitamins: Secondary | ICD-10-CM

## 2013-04-26 ENCOUNTER — Other Ambulatory Visit (HOSPITAL_COMMUNITY)
Admission: RE | Admit: 2013-04-26 | Discharge: 2013-04-26 | Disposition: A | Discharge status: Home / Self Care | Payer: BC Managed Care – PPO | Source: Ambulatory Visit | Attending: Family Medicine | Admitting: Family Medicine

## 2013-04-30 ENCOUNTER — Encounter: Payer: Self-pay | Admitting: Family Medicine

## 2013-04-30 ENCOUNTER — Ambulatory Visit: Payer: BC Managed Care – PPO | Admitting: Family Medicine

## 2013-04-30 ENCOUNTER — Ambulatory Visit (INDEPENDENT_AMBULATORY_CARE_PROVIDER_SITE_OTHER): Payer: BC Managed Care – PPO | Admitting: Family Medicine

## 2013-04-30 VITALS — BP 150/90 | Temp 97.8°F | Wt 216.0 lb

## 2013-04-30 DIAGNOSIS — I119 Hypertensive heart disease without heart failure: Secondary | ICD-10-CM

## 2013-04-30 MED ORDER — LISINOPRIL-HYDROCHLOROTHIAZIDE 10-12.5 MG PO TABS: 1.0000 | ORAL_TABLET | Freq: Every day | ORAL | Status: DC

## 2013-04-30 NOTE — Progress Notes (Signed)
   Subjective:    Patient ID: Erika [name], female    DOB: June 25, 1955, 58 y.o.   MRN: [id number]  HPI Erika [name] is a 58 year old female nonsmoker who comes in today for followup of suspected hypertension  When she was here last her blood pressure was slightly elevated. We had her monitor blood pressure at home and al abnormal at home. BP today 150/90  Genetically both her mother and father had hypertension   Review of Systems    review of systems negative Objective:   Physical Exam  Well-developed well-nourished female no acute distress vital signs stable she's afebrile BP right arm sitting position 150/90      Assessment & Plan:  Hypertension new onset,,,,,,,, start Zestoretic followup in

## 2013-04-30 NOTE — Patient Instructions (Signed)
Zestoretic,,,,,,,,, one tablet daily in the morning  Check your blood pressure daily in the morning  Followup in 2 weeks  Side effects of Zestoretic include hives and/or cough

## 2013-05-04 ENCOUNTER — Telehealth: Payer: Self-pay | Admitting: Family Medicine

## 2013-05-04 ENCOUNTER — Ambulatory Visit (INDEPENDENT_AMBULATORY_CARE_PROVIDER_SITE_OTHER): Payer: BC Managed Care – PPO | Admitting: Internal Medicine

## 2013-05-04 ENCOUNTER — Ambulatory Visit (INDEPENDENT_AMBULATORY_CARE_PROVIDER_SITE_OTHER)
Admission: RE | Admit: 2013-05-04 | Discharge: 2013-05-04 | Disposition: A | Discharge status: Home / Self Care | Payer: BC Managed Care – PPO | Source: Ambulatory Visit | Attending: Internal Medicine | Admitting: Internal Medicine

## 2013-05-04 ENCOUNTER — Encounter: Payer: Self-pay | Admitting: Internal Medicine

## 2013-05-04 VITALS — BP 130/90 | HR 70 | Temp 97.6°F | Wt 215.0 lb

## 2013-05-04 DIAGNOSIS — R0602 Shortness of breath: Secondary | ICD-10-CM

## 2013-05-04 DIAGNOSIS — I119 Hypertensive heart disease without heart failure: Secondary | ICD-10-CM

## 2013-05-04 DIAGNOSIS — G4733 Obstructive sleep apnea (adult) (pediatric): Secondary | ICD-10-CM

## 2013-05-04 DIAGNOSIS — R5381 Other malaise: Secondary | ICD-10-CM

## 2013-05-04 DIAGNOSIS — R5383 Other fatigue: Secondary | ICD-10-CM

## 2013-05-04 DIAGNOSIS — R05 Cough: Secondary | ICD-10-CM

## 2013-05-04 DIAGNOSIS — R059 Cough, unspecified: Secondary | ICD-10-CM

## 2013-05-04 MED ORDER — ALBUTEROL SULFATE (2.5 MG/3ML) 0.083% IN NEBU
2.5000 mg | INHALATION_SOLUTION | RESPIRATORY_TRACT | Status: AC
  Administered 2013-05-04: 2.5 mg via RESPIRATORY_TRACT

## 2013-05-04 NOTE — Progress Notes (Signed)
Pre visit review using our clinic review tool, if applicable. No additional management support is needed unless otherwise documented below in the visit note. 

## 2013-05-04 NOTE — Telephone Encounter (Signed)
FYI!  Pt coming to see you today.

## 2013-05-04 NOTE — Progress Notes (Signed)
Chief Complaint  Patient presents with  . Fatigue  . Shortness of Breath    HPI: Patient comes in today for SDA for  new problem evaluation. Saw dr TODD last week for BP elevation  An feels the same as now   Except had HA also Then began getting a cough and stopped   After second day.  still has cough but not as bad.   No fever  . Has had the breathing issue predating  medicine. Has "asthma"   But no lung disease  Hard to get enough air . Chest hrurts sometimes when deep breath.  No tobacco  For 11 years.  ROS: See pertinent positives and negatives per HPI.stress at work but  Uncertain if anxiety .  Cainsidious onset .  Very stressed  Feels like choking at times . Upper med chest worse at tnight  No hb per se. No vomiting fever leg swelling new injury or surgeries.   Past Medical History  Diagnosis Date  . Anxiety   . Menopausal syndrome   . OSA on CPAP   . Neuroma of foot     Family History  Problem Relation Age of Onset  . Diabetes Mother   . Heart disease Mother   . Diabetes Maternal Grandmother   . Diabetes Maternal Grandfather     History   Social History  . Marital Status: Married    Spouse Name: N/A    Number of Children: N/A  . Years of Education: N/A   Social History Main Topics  . Smoking status: Former Smoker    Types: Cigarettes    Quit date: 06/01/2000  . Smokeless tobacco: Never Used  . Alcohol Use: No  . Drug Use: No  . Sexual Activity: None   Other Topics Concern  . None   Social History Narrative  . None    Outpatient Encounter Prescriptions as of 05/04/2013  Medication Sig  . citalopram (CELEXA) 40 MG tablet TAKE 2 TABS DAILY AT BEDTIME  . cyanocobalamin (,VITAMIN B-12,) 1000 MCG/ML injection Inject 1 mL (1,000 mcg total) into the muscle every 30 (thirty) days.  Marland Kitchen ibuprofen (ADVIL,MOTRIN) 200 MG tablet Take 200 mg by mouth every 6 (six) hours as needed for pain. Take 3 tabs bid  . LORazepam (ATIVAN) 0.5 MG tablet Take 1 tablet (0.5 mg  total) by mouth every 6 (six) hours as needed.  . SYRINGE-NEEDLE, DISP, 3 ML (B-D SYRINGE/NEEDLE 3CC/25GX5/8) 25G X 5/8" 3 ML MISC 1 Syringe by Does not apply route every 30 (thirty) days. Use for b12 injections  . lisinopril-hydrochlorothiazide (PRINZIDE,ZESTORETIC) 10-12.5 MG per tablet Take 1 tablet by mouth daily.  . [EXPIRED] albuterol (PROVENTIL) (2.5 MG/3ML) 0.083% nebulizer solution 2.5 mg     EXAM:  BP 130/90  Pulse 70  Temp(Src) 97.6 F (36.4 C) (Oral)  Wt 215 lb (97.523 kg)  SpO2 98%  Body mass index is 34.19 kg/(m^2).  GENERAL: vitals reviewed and listed above, alert, oriented, appears well hydrated and in no acute distress mild hoarseness and dry cough  resp quiet  noretractions  HEENT: atraumatic, conjunctiva  clear, no obvious abnormalities on inspection of external nose and ears tm intact nl lmOP : no lesion edema or exudate nares patent neg sinus tenderness  NECK: no obvious masses on inspection palpation  No adenopathy or jvd LUNGS: clear to auscultation bilaterally, no wheezes, rales or rhonchi, ? Dec bs  After nebulizer jittery but inc air movement and looser cough pt feels no idfferent  CV: HRRR, no clubbing cyanosis or sig  peripheral edema nl cap refill  MS: moves all extremities without noticeable focal  abnormality PSYCH: pleasant and cooperative mild anxiety   ASSESSMENT AND PLAN:  Discussed the following assessment and plan:  Cough - ? 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 ? if asthmatic rad ex tobacco /nl pulse ox  - Plan: albuterol (PROVENTIL) (2.5 MG/3ML) 0.083% nebulizer solution 2.5 mg, DG Chest 2 View  OBSTRUCTIVE SLEEP APNEA - overdue for fu bu hx  see dr Charolotte Eke   -Patient advised to return or notify health care team  if symptoms worsen or persist or new concerns arise.  Patient Instructions  Get c xray This could be asthmatic sx  As sx predated the bp medication but this was right to stop . If needed we can add different bp medication. Get appt with dr Gwenette Greet about the sleep apnea and current sx.  Reflux could add to the choking sx at night   Begin omeprazole once a day   For at least 2 weeks. Appt. with dr Gwenette Greet .  We may add  Prednisone  If x ray is ok .  Fu with dr Sherren Mocha in 1-2 weeks    Standley Brooking. Panosh M.D.

## 2013-05-04 NOTE — Telephone Encounter (Signed)
Patient Information:  Caller Name: Daizy  Phone: [phone]  Patient: Erika, [name]  Gender: Female  DOB: 1956-03-07  Age: 58 Years  PCP: Stevie Kern [facility])  Office Follow Up:  Does the office need to follow up with this patient?: No  Instructions For The Office: N/A  RN Note:  Pt reports generalized weakness for the past 3 weeks. She was prescribed Lisinopril for HTN on 04/30/13. She stopped the med after 2 pills due to a cough. She feels so weka today she feels she can't drive.  Symptoms  Reason For Call & Symptoms: Generalized weakness  Reviewed Health History In EMR: Yes  Reviewed Medications In EMR: Yes  Reviewed Allergies In EMR: Yes  Reviewed Surgeries / Procedures: No  Date of Onset of Symptoms: 04/13/2013  Guideline(s) Used:  Weakness (Generalized) and Fatigue  Disposition Per Guideline:   Go to Office Now  Reason For Disposition Reached:   Moderate weakness (i.e., interferes with work, school, normal activities) and cause unknown  Advice Given:  Increase fluid intake, rest. Do not drive, have husband bring you in for your apptointment. If sx worsen before your appt, call back.   Patient Will Follow Care Advice:  YES  Appointment Scheduled:  05/04/2013 15:15:00 Appointment Scheduled Provider:  Shanon Ace [facility])

## 2013-05-04 NOTE — Patient Instructions (Addendum)
Get c xray This could be asthmatic sx  As sx predated the bp medication but this was right to stop . If needed we can add different bp medication. Get appt with dr Gwenette Greet about the sleep apnea and current sx.  Reflux could add to the choking sx at night   Begin omeprazole once a day   For at least 2 weeks. Appt. with dr Gwenette Greet .  We may add  Prednisone  If x ray is ok .  Fu with dr Sherren Mocha in 1-2 weeks

## 2013-05-05 ENCOUNTER — Other Ambulatory Visit: Payer: Self-pay | Admitting: Family Medicine

## 2013-05-05 MED ORDER — PREDNISONE 20 MG PO TABS: ORAL_TABLET | ORAL | Status: DC

## 2013-05-05 NOTE — Telephone Encounter (Signed)
Pt notified of results.  See result note. 

## 2013-05-05 NOTE — Telephone Encounter (Signed)
Pt had xray done yesterday and would like results

## 2013-05-14 ENCOUNTER — Ambulatory Visit: Payer: BC Managed Care – PPO | Admitting: Family Medicine

## 2013-05-15 ENCOUNTER — Institutional Professional Consult (permissible substitution): Payer: BC Managed Care – PPO | Admitting: Critical Care Medicine

## 2013-06-03 ENCOUNTER — Telehealth: Payer: Self-pay | Admitting: Family Medicine

## 2013-06-03 NOTE — Telephone Encounter (Signed)
Patient Information:  Caller Name: Erika Erika [name] Erika [name]  Phone: [phone]  Patient: Erika Erika [name], Erika [name]  Gender: Female  DOB: [birth year]  Age: 58 Years  PCP: Stevie Kern [facility])  Office Follow Up:  Does the office need to follow up with this patient?: No  Instructions For The Office: N/A  RN Note:  Patient calling regarding fall while attempting to get into  vehicle this AM.  Symptoms  Reason For Call & Symptoms: fell on ice  Reviewed Health History In EMR: Yes  Reviewed Medications In EMR: Yes  Reviewed Allergies In EMR: Yes  Reviewed Surgeries / Procedures: Yes  Date of Onset of Symptoms: 06/03/2013  Guideline(s) Used:  Back Injury  Disposition Per Guideline:   Home Care  Reason For Disposition Reached:   Back swelling, bruise or pain from direct blow to the back  Advice Given:  Apply a Cold Pack:  Apply a cold pack or an ice bag (wrapped in a moist towel) to the area for 20 minutes. Repeat in 1 hour, then every 4 hours while awake.  Continue this for the first 48 hours after an injury (Reason: to reduce the swelling and pain).  Apply Heat to the Area:  Beginning 48 hours after an injury, apply a warm washcloth or heating pad for 10 minutes three times a day.  This will help increase blood flow and improve healing.  Avoid:  Avoid heavy lifting and any sports activities for the first week after an injury.  Rest vs. Movement:  Movement of the back is generally more healing in the long term than rest. Continue normal activities as much as your pain permits.  Call Back If:  Severe pain lasts over 2 hours after pain medicine and ice  You become worse.  Patient Will Follow Care Advice:  YES

## 2013-07-28 ENCOUNTER — Encounter: Payer: Self-pay | Admitting: Pulmonary Disease

## 2013-07-28 ENCOUNTER — Ambulatory Visit (INDEPENDENT_AMBULATORY_CARE_PROVIDER_SITE_OTHER): Payer: BC Managed Care – PPO | Admitting: Pulmonary Disease

## 2013-07-28 VITALS — BP 118/82 | HR 72 | Temp 98.1°F | Ht 66.0 in | Wt 218.0 lb

## 2013-07-28 DIAGNOSIS — G4733 Obstructive sleep apnea (adult) (pediatric): Secondary | ICD-10-CM

## 2013-07-28 NOTE — Patient Instructions (Signed)
Will have your machine set on auto, so it self adjusts to your needs during the night.  Let me know if you are continuing to have air hunger during the night. If you continue to have dry mouth and sore throat (even after turning heat down on humidifier), let me know and we can add chin strap. Work on weight loss followup with me again in one year.

## 2013-07-28 NOTE — Assessment & Plan Note (Signed)
The patient has been wearing CPAP compliantly for her sleep apnea, but most recently has been having breakthrough snoring and air hunger at times during the night. Her current CPAP is set on a fixed pressure, and she is having breakthrough events I. the download. We'll therefore change her over to the automatic setting to see if this is better. I am also concerned about the possibility of her losing pressure with mouth opening, and will add a chin strap if she continues to have a dry mouth and sore throat each morning. She is to let us know if this is the case. Finally, I have encouraged her to work aggressively on weight loss.

## 2013-07-28 NOTE — Progress Notes (Signed)
Subjective:    Patient ID: Erika [name], female    DOB: [birth year], 58 y.o.   MRN: [id number]  HPI The patient is a 58 year old female who comes in today as a self-referral to reestablish for management of obstructive sleep apnea. She has a history of OSA dating back to 2004, where she had an AHI of 22 events per hour. She was started on CPAP, and was last seen in 2010 where we got her a new CPAP device. She has not followed, and currently is having issues with air hunger during the night. She is also having breakthrough snoring according to her husband, and awakens in the mornings with a dry mouth and sore throat despite using heated humidity. She has not as rested in the mornings upon arising as in the past, and is starting to get daytime sleepiness again with inactivity. Her weight is up about 7 pounds from the last visit. She currently uses nasal pillows, but does not think that she opens her mouth during the night. Her download today shows breakthrough apnea with an AHI of 9 events per hour, but she did have adequate compliance. Unfortunately, her device does not show leak data.   Sleep Questionnaire What time do you typically go to bed?( Between what hours) 930 930 at 1126 on 07/28/13 by [name], CMA How long does it take you to fall asleep? 84min-1hr 90min-1hr at 1126 on 07/28/13 by [name], CMA How many times during the night do you wake up? 4 4 at 1126 on 07/28/13 by [name], CMA What time do you get out of bed to start your day? 0600 0600 at 1126 on 07/28/13 by [name], CMA Do you drive or operate heavy machinery in your occupation? No No at 1126 on 07/28/13 by [name], CMA How much has your weight changed (up or down) over the past two years? (In pounds) 10 lb (4.536 kg) 10 lb (4.536 kg) at 1126 on 07/28/13 by [name], CMA Have you ever had a sleep study before? Yes Yes at 1126 on 07/28/13 by [name], CMA If yes, location of  study? [facility] at 1126 on 07/28/13 by [name], CMA If yes, date of study? 08/11/2002 08/11/2002 at 1126 on 07/28/13 by [name], CMA Do you currently use CPAP? Yes Yes at 1126 on 07/28/13 by [name], CMA If so, what pressure? unsure unsure at 1126 on 07/28/13 by [name], CMA Do you wear oxygen at any time? No No at 1126 on 07/28/13 by [name], CMA   Review of Systems  Constitutional: Positive for unexpected weight change. Negative for fever.  HENT: Positive for sore throat and trouble swallowing. Negative for congestion, dental problem, ear pain, nosebleeds, postnasal drip, rhinorrhea, sinus pressure and sneezing.   Eyes: Negative for redness and itching.  Respiratory: Positive for shortness of breath. Negative for cough, chest tightness and wheezing.   Cardiovascular: Positive for chest pain. Negative for palpitations and leg swelling.  Gastrointestinal: Negative for nausea and vomiting.  Genitourinary: Negative for dysuria.  Musculoskeletal: Positive for arthralgias and joint swelling.  Skin: Negative for rash ( itching).  Neurological: Negative for headaches.  Hematological: Does not bruise/bleed easily.  Psychiatric/Behavioral: Negative for dysphoric mood. The patient is nervous/anxious.        Objective:   Physical Exam Constitutional:  Overweight female, no acute distress  HENT:  Nares patent without discharge  Oropharynx without exudate, palate and uvula are mildly elongated.   Eyes:  Perrla, eomi, no scleral icterus  Neck:  No JVD, no TMG  Cardiovascular:  Normal rate, regular rhythm, no rubs or gallops.  No murmurs        Intact distal pulses  Pulmonary :  Normal breath sounds, no stridor or respiratory distress   No rales, rhonchi, or wheezing  Abdominal:  Soft, nondistended, bowel sounds present.  No tenderness noted.   Musculoskeletal:  No lower extremity edema noted.  Lymph Nodes:  No cervical  lymphadenopathy noted  Skin:  No cyanosis noted  Neurologic:  Alert, appropriate, moves all 4 extremities without obvious deficit.         Assessment & Plan:

## 2013-08-26 ENCOUNTER — Other Ambulatory Visit: Payer: Self-pay | Admitting: Family Medicine

## 2013-09-22 ENCOUNTER — Ambulatory Visit: Payer: Self-pay | Admitting: Family Medicine

## 2013-09-22 ENCOUNTER — Ambulatory Visit: Payer: Self-pay | Admitting: Internal Medicine

## 2013-09-22 ENCOUNTER — Telehealth: Payer: Self-pay | Admitting: Family Medicine

## 2013-09-22 NOTE — Telephone Encounter (Signed)
Noted  

## 2013-09-22 NOTE — Telephone Encounter (Signed)
Patient Information:  Caller Name: Zahra  Phone: [phone]  Patient: Erika [name], Erika [name]  Gender: Female  DOB: 09/21/1955  Age: 58 Years  PCP: Ste[facility])  Office Follow Up:  Does the office need to follow up with this patient?: Yes  Instructions For The Office: Patient PCP Dr. Sherren Mocha is booked. Scheduled with Dr. Regis Bill for evaluation of symptoms.  RN Note:  Appt scheduled. Care advice provided to patient. Understanding expressed  Symptoms  Reason For Call & Symptoms: Patient states onset of Chest Pain yesterday afternoon - Location Right chest wall, right shoulder , right neck and right arm. Lasting 30 seconds-1 minute.  No n/v/d, +diaphoresis.  Episode x1.  She felt tired after the episode.  No other symptoms currently or since episode. voice is clear and calm.   She is concerned after discussing with her mother.  Reviewed Health History In EMR: Yes  Reviewed Medications In EMR: Yes  Reviewed Allergies In EMR: Yes  Reviewed Surgeries / Procedures: Yes  Date of Onset of Symptoms: 09/21/2013  Guideline(s) Used:  Chest Pain  Disposition Per Guideline:   See Today in Office  Reason For Disposition Reached:   All other patients with chest pain  Advice Given:  Fleeting Chest Pain:  Fleeting chest pains that last only a few seconds and then go away are generally not serious. They may be from pinched muscles or nerves in your chest wall.  Call Back If:  Severe chest pain  Constant chest pain lasting longer than 5 minutes  Difficulty breathing  Fever  You become worse.  Patient Will Follow Care Advice:  YES  Appointment Scheduled:  09/22/2013 11:30:00 Appointment Scheduled Provider:  Shanon Ace [facility])

## 2013-09-23 ENCOUNTER — Ambulatory Visit (INDEPENDENT_AMBULATORY_CARE_PROVIDER_SITE_OTHER): Payer: BC Managed Care – PPO | Admitting: Family Medicine

## 2013-09-23 ENCOUNTER — Encounter: Payer: Self-pay | Admitting: Family Medicine

## 2013-09-23 VITALS — BP 120/80 | Temp 98.4°F | Wt 221.0 lb

## 2013-09-23 DIAGNOSIS — R079 Chest pain, unspecified: Secondary | ICD-10-CM

## 2013-09-23 NOTE — Patient Instructions (Signed)
We will set you up a screening exercise stress test in cardiology

## 2013-09-23 NOTE — Progress Notes (Signed)
   Subjective:    Patient ID: Erika [name], female    DOB: [birth year], 58 y.o.   MRN: [id number]  HPI Erika [name] is a 58 year old female nonsmoker who comes in today for evaluation of chest pain  She states 2 days ago this past Monday she was at work and standing about 3 PM had the gradual onset of discomfort in the middle of her chest. It radiated up in her neck and down her right arm. It lasts about 30 seconds and went away. She felt fatigued and diaphoretic. Also she said shortness of breath but she said she's been short of breath for many months now.  Again the episode lasted about 30 seconds and went away.  Father died at 51 heart disease he underlying hypertension diabetes and smoker mother is a diabetic and also has heart disease  3 brothers 2 in good health one has had cancer no heart disease in any of her brothers. 2 sisters one COPD from a smoker the other is in good health.  Erika [name]'s always had normal blood sugar blood pressure and lipids and has been a nonsmoker   Review of Systems Review of systems otherwise negative    Objective:   Physical Exam  Well-developed well-nourished female no acute distress vital signs stable she is afebrile  Cardiopulmonary exam normal  EKG normal      Assessment & Plan:  Atypical chest pain however she does some elements consistent with coronary disease. We'll therefore set her up for a screening stress test and cardiology

## 2013-09-23 NOTE — Progress Notes (Signed)
Pre visit review using our clinic review tool, if applicable. No additional management support is needed unless otherwise documented below in the visit note. 

## 2013-11-16 ENCOUNTER — Encounter: Payer: BC Managed Care – PPO | Admitting: Physician Assistant

## 2014-06-07 ENCOUNTER — Other Ambulatory Visit (INDEPENDENT_AMBULATORY_CARE_PROVIDER_SITE_OTHER): Payer: BLUE CROSS/BLUE SHIELD

## 2014-06-07 DIAGNOSIS — Z0000 Encounter for general adult medical examination without abnormal findings: Secondary | ICD-10-CM

## 2014-06-07 LAB — BASIC METABOLIC PANEL
BUN: 14 mg/dL (ref 6–23)
CHLORIDE: 106 meq/L (ref 96–112)
CO2: 25 meq/L (ref 19–32)
Calcium: 9.6 mg/dL (ref 8.4–10.5)
Creatinine, Ser: 0.65 mg/dL (ref 0.40–1.20)
GFR: 99.01 mL/min (ref >60.00)
GLUCOSE: 73 mg/dL (ref 70–99)
POTASSIUM: 3.7 meq/L (ref 3.5–5.1)
Sodium: 141 mEq/L (ref 135–145)

## 2014-06-07 LAB — LIPID PANEL
Cholesterol: 235 mg/dL — ABNORMAL HIGH (ref 0–200)
HDL: 58.2 mg/dL (ref >39.00)
LDL CALC: 150 mg/dL — AB (ref 0–99)
NONHDL: 176.8
Total CHOL/HDL Ratio: 4
Triglycerides: 132 mg/dL (ref 0.0–149.0)
VLDL: 26.4 mg/dL (ref 0.0–40.0)

## 2014-06-07 LAB — CBC WITH DIFFERENTIAL/PLATELET
Basophils Absolute: 0 10*3/uL (ref 0.0–0.1)
Basophils Relative: 0.5 % (ref 0.0–3.0)
EOS ABS: 0 10*3/uL (ref 0.0–0.7)
Eosinophils Relative: 0.7 % (ref 0.0–5.0)
HCT: 37.9 % (ref 36.0–46.0)
Hemoglobin: 12.9 g/dL (ref 12.0–15.0)
Lymphocytes Relative: 29 % (ref 12.0–46.0)
Lymphs Abs: 1.8 10*3/uL (ref 0.7–4.0)
MCHC: 33.9 g/dL (ref 30.0–36.0)
MCV: 82.9 fl (ref 78.0–100.0)
MONOS PCT: 4.8 % (ref 3.0–12.0)
Monocytes Absolute: 0.3 10*3/uL (ref 0.1–1.0)
Neutro Abs: 4 10*3/uL (ref 1.4–7.7)
Neutrophils Relative %: 65 % (ref 43.0–77.0)
Platelets: 297 10*3/uL (ref 150.0–400.0)
RBC: 4.57 Mil/uL (ref 3.87–5.11)
RDW: 15.1 % (ref 11.5–15.5)
WBC: 6.2 10*3/uL (ref 4.0–10.5)

## 2014-06-07 LAB — HEPATIC FUNCTION PANEL
ALBUMIN: 4.1 g/dL (ref 3.5–5.2)
ALK PHOS: 62 U/L (ref 39–117)
ALT: 14 U/L (ref 0–35)
AST: 18 U/L (ref 0–37)
BILIRUBIN DIRECT: 0.1 mg/dL (ref 0.0–0.3)
TOTAL PROTEIN: 7.1 g/dL (ref 6.0–8.3)
Total Bilirubin: 0.4 mg/dL (ref 0.2–1.2)

## 2014-06-07 LAB — POCT URINALYSIS DIP (MANUAL ENTRY)
Bilirubin, UA: NEGATIVE
Glucose, UA: NEGATIVE
Ketones, POC UA: NEGATIVE
LEUKOCYTES UA: NEGATIVE
NITRITE UA: NEGATIVE
PROTEIN UA: NEGATIVE
SPEC GRAV UA: 1.02
Urobilinogen, UA: 0.2
pH, UA: 5.5

## 2014-06-07 LAB — TSH: TSH: 7.91 u[IU]/mL — AB (ref 0.35–4.50)

## 2014-06-14 ENCOUNTER — Encounter: Payer: Self-pay | Admitting: Family Medicine

## 2014-06-14 ENCOUNTER — Ambulatory Visit (INDEPENDENT_AMBULATORY_CARE_PROVIDER_SITE_OTHER): Payer: BLUE CROSS/BLUE SHIELD | Admitting: Family Medicine

## 2014-06-14 VITALS — BP 110/80 | Temp 98.3°F | Ht 65.5 in | Wt 196.0 lb

## 2014-06-14 DIAGNOSIS — Z23 Encounter for immunization: Secondary | ICD-10-CM

## 2014-06-14 DIAGNOSIS — R899 Unspecified abnormal finding in specimens from other organs, systems and tissues: Secondary | ICD-10-CM

## 2014-06-14 DIAGNOSIS — Z0000 Encounter for general adult medical examination without abnormal findings: Secondary | ICD-10-CM | POA: Insufficient documentation

## 2014-06-14 DIAGNOSIS — F3342 Major depressive disorder, recurrent, in full remission: Secondary | ICD-10-CM

## 2014-06-14 LAB — TSH: TSH: 7.6 u[IU]/mL — ABNORMAL HIGH (ref 0.35–4.50)

## 2014-06-14 LAB — T3, FREE: T3, Free: 3 pg/mL (ref 2.3–4.2)

## 2014-06-14 LAB — T4, FREE: FREE T4: 0.72 ng/dL (ref 0.60–1.60)

## 2014-06-14 MED ORDER — HYDROCODONE-HOMATROPINE 5-1.5 MG/5ML PO SYRP: 5.0000 mL | ORAL_SOLUTION | Freq: Three times a day (TID) | ORAL | Status: DC | PRN

## 2014-06-14 MED ORDER — CITALOPRAM HYDROBROMIDE 40 MG PO TABS: ORAL_TABLET | ORAL | Status: DC

## 2014-06-14 NOTE — Progress Notes (Signed)
Pre visit review using our clinic review tool, if applicable. No additional management support is needed unless otherwise documented below in the visit note. 

## 2014-06-14 NOTE — Progress Notes (Signed)
   Subjective:    Patient ID: Erika [name], female    DOB: 03-15-1956, 59 y.o.   MRN: [id number]  HPI Erika [name] is a 59 year old female nonsmoker who comes in today for general physical examination  She takes Celexa 40 mg dose 2 tabs daily at bedtime for history of depression. Doing well. She also has a CPAP machine. She sees pulmonary on a yearly basis for evaluation of sleep apnea.  Her weight is 196 pounds. She's going to start an exercise program.  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 Gloriann [name],,,,,, she does BSE monthly and annual mammography. Colonoscopy at age 44 actually done November 2014 normal.  Vaccinations updated by Erika [name] she was given a tetanus booster today.  She says she feels well and has no complaints  LMP about 8 years ago. Pap a year ago normal. GYN wise asymptomatic therefore Pap every 3 years.   Review of Systems  Constitutional: Negative.   HENT: Negative.   Eyes: Negative.   Respiratory: Negative.   Cardiovascular: Negative.   Gastrointestinal: Negative.   Endocrine: Negative.   Genitourinary: Negative.   Musculoskeletal: Negative.   Skin: Negative.   Allergic/Immunologic: Negative.   Neurological: Negative.   Hematological: Negative.   Psychiatric/Behavioral: Negative.        Objective:   Physical Exam  Constitutional: She appears well-developed and well-nourished.  HENT:  Head: Normocephalic and atraumatic.  Right Ear: External ear normal.  Left Ear: External ear normal.  Nose: Nose normal.  Mouth/Throat: Oropharynx is clear and moist.  Eyes: EOM are normal. Pupils are equal, round, and reactive to light.  Neck: Normal range of motion. Neck supple. No JVD present. No tracheal deviation present. No thyromegaly present.  Cardiovascular: Normal rate, regular rhythm, normal heart sounds and intact distal pulses.  Exam reveals no gallop and no friction rub.   No murmur  heard. Pulmonary/Chest: Effort normal and breath sounds normal. No stridor. No respiratory distress. She has no wheezes. She has no rales. She exhibits no tenderness.  Abdominal: Soft. Bowel sounds are normal. She exhibits no distension and no mass. There is no tenderness. There is no rebound and no guarding.  Genitourinary:  Bilateral breast exam normal  Musculoskeletal: Normal range of motion.  Lymphadenopathy:    She has no cervical adenopathy.  Neurological: She is alert. She has normal reflexes. No cranial nerve deficit. She exhibits normal muscle tone. Coordination normal.  Skin: Skin is warm and dry. No rash noted. No erythema. No pallor.  Psychiatric: She has a normal mood and affect. Her behavior is normal. Judgment and thought content normal.  Nursing note and vitals reviewed.         Assessment & Plan:  Healthy female  History of present depression continue Celexa 80 mg daily  Overweight begin diet and exercise program  History of sleep apnea,,,, yearly follow-up in pulmonary,

## 2014-06-14 NOTE — Patient Instructions (Signed)
Continue the Celexa  Begin a walking program 15 minutes daily,,,,,,, avoid carbohydrates  Annual follow-up with pulmonary  Return in one year sooner if any problems

## 2014-06-15 ENCOUNTER — Other Ambulatory Visit: Payer: Self-pay | Admitting: Family Medicine

## 2014-06-15 DIAGNOSIS — E039 Hypothyroidism, unspecified: Secondary | ICD-10-CM

## 2014-06-15 MED ORDER — LEVOTHYROXINE SODIUM 25 MCG PO TABS: 25.0000 ug | ORAL_TABLET | Freq: Every day | ORAL | Status: DC

## 2014-06-17 ENCOUNTER — Other Ambulatory Visit: Payer: Self-pay | Admitting: Family Medicine

## 2014-08-03 ENCOUNTER — Ambulatory Visit (INDEPENDENT_AMBULATORY_CARE_PROVIDER_SITE_OTHER): Payer: BLUE CROSS/BLUE SHIELD | Admitting: Pulmonary Disease

## 2014-08-03 ENCOUNTER — Encounter: Payer: Self-pay | Admitting: Pulmonary Disease

## 2014-08-03 VITALS — BP 122/64 | HR 68 | Temp 97.8°F | Ht 66.0 in | Wt 202.8 lb

## 2014-08-03 DIAGNOSIS — G4733 Obstructive sleep apnea (adult) (pediatric): Secondary | ICD-10-CM

## 2014-08-03 NOTE — Progress Notes (Signed)
   Subjective:    Patient ID: Erika [name], female    DOB: 09-01-1955, 59 y.o.   MRN: [id number]  HPI Patient comes in today for follow-up of her known obstructive sleep apnea. She is wearing C Pap compliantly for the most part, and has adequate control of her AHI on her current pressure. She is having issues with her mask fit, as well as her humidity. She has not tried to work on these issues on her own.   Review of Systems  Constitutional: Negative for fever and unexpected weight change.  HENT: Negative for congestion, dental problem, ear pain, nosebleeds, postnasal drip, rhinorrhea, sinus pressure, sneezing, sore throat and trouble swallowing.   Eyes: Negative for redness and itching.  Respiratory: Negative for cough, chest tightness, shortness of breath and wheezing.   Cardiovascular: Negative for palpitations and leg swelling.  Gastrointestinal: Negative for nausea and vomiting.  Genitourinary: Negative for dysuria.  Musculoskeletal: Negative for joint swelling.  Skin: Negative for rash.  Neurological: Negative for headaches.  Hematological: Does not bruise/bleed easily.  Psychiatric/Behavioral: Negative for dysphoric mood. The patient is not nervous/anxious.        Objective:   Physical Exam Overweight female in no acute distress Nose without purulence or discharge noted Neck without lymphadenopathy or thyromegaly No skin breakdown or pressure necrosis from the C Pap mask Lower extremities without significant edema, no cyanosis Alert and oriented, moves all 4 extremities.       Assessment & Plan:

## 2014-08-03 NOTE — Patient Instructions (Signed)
Continue on cpap, and keep up with mask changes and supplies. Work with your home care company on a better fitting mask.  If continuing to have issues, let us know Turn up your heat to get more moisture, turn down or off to get less.  Work on weight loss followup again in one year

## 2014-08-03 NOTE — Assessment & Plan Note (Signed)
The patient continues to wear her C Pap device for her moderate obstructive sleep apnea, but is having issues with her mask fit and also humidity level. I have instructed her on how to adjust her heated humidifier, and she will need to get with her home care company to look for a better fitting mask. I have asked her to try and wear her C Pap every night if possible, and to work aggressively on weight loss.

## 2014-09-27 ENCOUNTER — Telehealth: Payer: Self-pay

## 2014-09-27 NOTE — Telephone Encounter (Signed)
Not due until 02-25-15

## 2014-11-06 LAB — HM MAMMOGRAPHY: HM MAMMO: NORMAL

## 2014-11-08 ENCOUNTER — Encounter: Payer: Self-pay | Admitting: Family Medicine

## 2014-12-08 ENCOUNTER — Other Ambulatory Visit: Payer: Self-pay

## 2015-06-25 ENCOUNTER — Other Ambulatory Visit: Payer: Self-pay | Admitting: Family Medicine

## 2015-09-29 ENCOUNTER — Other Ambulatory Visit: Payer: Self-pay | Admitting: Family Medicine

## 2015-09-29 NOTE — Telephone Encounter (Signed)
Rx refill sent to pharmacy. 

## 2016-01-02 ENCOUNTER — Other Ambulatory Visit: Payer: Self-pay | Admitting: Family Medicine

## 2016-05-01 ENCOUNTER — Ambulatory Visit: Payer: Self-pay

## 2016-05-01 ENCOUNTER — Other Ambulatory Visit: Payer: Self-pay | Admitting: Occupational Medicine

## 2016-05-01 DIAGNOSIS — M25512 Pain in left shoulder: Secondary | ICD-10-CM

## 2016-05-01 DIAGNOSIS — M542 Cervicalgia: Secondary | ICD-10-CM

## 2016-05-01 DIAGNOSIS — G8929 Other chronic pain: Secondary | ICD-10-CM

## 2016-07-16 ENCOUNTER — Encounter (HOSPITAL_COMMUNITY): Payer: Self-pay | Admitting: Emergency Medicine

## 2016-07-16 ENCOUNTER — Ambulatory Visit (HOSPITAL_COMMUNITY)
Admission: EM | Admit: 2016-07-16 | Discharge: 2016-07-16 | Disposition: A | Discharge status: Home / Self Care | Payer: Self-pay | Attending: Internal Medicine | Admitting: Internal Medicine

## 2016-07-16 DIAGNOSIS — L308 Other specified dermatitis: Secondary | ICD-10-CM

## 2016-07-16 DIAGNOSIS — M5442 Lumbago with sciatica, left side: Secondary | ICD-10-CM

## 2016-07-16 DIAGNOSIS — B028 Zoster with other complications: Secondary | ICD-10-CM

## 2016-07-16 MED ORDER — FAMCICLOVIR 500 MG PO TABS: 500.0000 mg | ORAL_TABLET | Freq: Three times a day (TID) | ORAL | 0 refills | Status: AC

## 2016-07-16 MED ORDER — HYDROCODONE-ACETAMINOPHEN 5-325 MG PO TABS: 1.0000 | ORAL_TABLET | Freq: Four times a day (QID) | ORAL | 0 refills | Status: DC | PRN

## 2016-07-16 MED ORDER — PREDNISONE 50 MG PO TABS: 50.0000 mg | ORAL_TABLET | Freq: Every day | ORAL | 0 refills | Status: AC

## 2016-07-16 NOTE — ED Triage Notes (Signed)
The patient presented to the [facility] with a complaint of a painful rash on her left leg x 1 week. The patient also complained of back pain that started after the rash.

## 2016-07-16 NOTE — Discharge Instructions (Addendum)
Rash and low back/leg discomfort consistent with shingles.  Prescriptions for an anti viral medicine (famciclovir) and prednisone (for inflammation/pain) sent to [facility] on [address].  Prescription for vicodin (pain medicine) printed.  Ok to take aleve or ibuprofen with vicodin.  Note for work, return to work on 4/6.  If not able to return to work, will need to followup with primary care provider or be seen again at urgent care.

## 2016-07-16 NOTE — ED Provider Notes (Signed)
[facility]    CSN: [id number] Arrival date & time: 07/16/16  1700     History   Chief Complaint Chief Complaint  Patient presents with  . Rash    HPI Erika [name] is a 61 y.o. female. Presents today with 2d hx painful skin and painful red patches on left thigh.  Preceded by aching in left low back > right low back, pain radiates into left buttock and leg.  No fever.  No malaise.  Works as a Secretary/administrator, had a very hard time at work today with irritation of rash from pant leg.    HPI  Past Medical History:  Diagnosis Date  . Anxiety   . Menopausal syndrome   . Neuroma of foot   . OSA on CPAP     Patient Active Problem List   Diagnosis Date Noted  . Routine general medical examination at a health care facility 06/14/2014  . Cough 05/04/2013  . Fatigue 01/14/2012  . Skin tag 08/23/2011  . Obstructive sleep apnea 11/30/2008  . ANXIETY 01/15/2008  . 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 BENIGN POSITIONAL VERTIGO 11/21/2006  . ALLERGIC RHINITIS 11/21/2006    Past Surgical History:  Procedure Laterality Date  . arm surgery     right elbow and arm  . FOOT SURGERY     neuroma surg on left foot     Home Medications    Prior to Admission medications   Medication Sig Start Date End Date Taking? Authorizing Provider  citalopram (CELEXA) 40 MG tablet TAKE 2 TABLETS AT BEDTIME 01/02/16  Yes Dorena Cookey, MD  ibuprofen (ADVIL,MOTRIN) 200 MG tablet Take 200 mg by mouth every 6 (six) hours as needed for pain. Take 3 tabs bid   Yes Historical Provider, MD  famciclovir (FAMVIR) 500 MG tablet Take 1 tablet (500 mg total) by mouth 3 (three) times daily. 07/16/16 07/23/16  Sherlene Shams, MD  HYDROcodone-acetaminophen (NORCO/VICODIN) 5-325 MG tablet Take 1-2 tablets by mouth 4 (four) times daily as needed. 07/16/16   Sherlene Shams, MD  predniSONE (DELTASONE) 50 MG tablet Take 1 tablet (50 mg total) by mouth daily. 07/16/16 07/21/16  Sherlene Shams, MD     Family History Family History  Problem Relation Age of Onset  . Diabetes Mother   . Heart disease Mother   . Diabetes Maternal Grandmother   . Diabetes Maternal Grandfather   . Asthma Sister   . Cancer Paternal Grandmother   . Cancer Maternal Uncle   . Cancer Maternal Aunt   . Cancer Brother     Social History Social History  Substance Use Topics  . Smoking status: Former Smoker    Packs/day: 1.00    Years: 30.00    Types: Cigarettes    Quit date: 06/01/2000  . Smokeless tobacco: Never Used  . Alcohol use Yes     Comment: rare     Allergies   Tetracycline hcl   Review of Systems Review of Systems  All other systems reviewed and are negative.    Physical Exam Triage Vital Signs ED Triage Vitals  Enc Vitals Group     BP 07/16/16 1721 (!) 187/91     Pulse Rate 07/16/16 1721 73     Resp 07/16/16 1721 16     Temp 07/16/16 1721 98 F (36.7 C)     Temp Source 07/16/16 1721 Oral     SpO2 07/16/16 1721 100 %     Weight --  Height --      Pain Score 07/16/16 1720 6     Pain Loc --    Updated Vital Signs BP (!) 161/88   Pulse 72   Temp 98 F (36.7 C) (Oral)   Resp 16   SpO2 100%   Physical Exam  Constitutional: She is oriented to person, place, and time. No distress.  Alert, nicely groomed  HENT:  Head: Atraumatic.  Eyes:  Conjugate gaze, no eye redness/drainage  Neck: Neck supple.  Cardiovascular: Normal rate.   Pulmonary/Chest: No respiratory distress.  Abdominal: She exhibits no distension.  Musculoskeletal: Normal range of motion.  No leg swelling Able to climb on/off exam table, gait steady  Neurological: She is alert and oriented to person, place, and time.  Skin: Skin is warm and dry.  No cyanosis Patchy red rash inner and posterior left thigh, skin is hyperesthetic. Rash consists of patches of red papules, some excoriated, some vesicular.  Nursing note and vitals reviewed.    UC Treatments / Results   Procedures Procedures  (including critical care time) None today  Final Clinical Impressions(s) / UC Diagnoses   Final diagnoses:  Herpes zoster dermatitis  Acute left-sided low back pain with left-sided sciatica   Rash and low back/leg discomfort consistent with shingles.  Prescriptions for an anti viral medicine (famciclovir) and prednisone (for inflammation/pain) [facility] to [facility] [address].  Prescription for vicodin (pain medicine) printed.  Ok to take aleve or ibuprofen with vicodin.  Note for work, return to work on 4/6.  If not able to return to work, will need to followup with primary care provider or be seen again at urgent care.    New Prescriptions Discharge Medication List as of 07/16/2016  6:09 PM    START taking these medications   Details  famciclovir (FAMVIR) 500 MG tablet Take 1 tablet (500 mg total) by mouth 3 (three) times daily., Starting Mon 07/16/2016, Until Mon 07/23/2016, Normal    HYDROcodone-acetaminophen (NORCO/VICODIN) 5-325 MG tablet Take 1-2 tablets by mouth 4 (four) times daily as needed., Starting Mon 07/16/2016, Print    predniSONE (DELTASONE) 50 MG tablet Take 1 tablet (50 mg total) by mouth daily., Starting Mon 07/16/2016, Until Sat 07/21/2016, Normal         Sherlene Shams, MD 07/18/16 2240

## 2016-07-18 ENCOUNTER — Telehealth (HOSPITAL_COMMUNITY): Payer: Self-pay | Admitting: Emergency Medicine

## 2016-07-18 NOTE — Telephone Encounter (Signed)
Patient needed a work note stating she was not contagious.  Discussed with bill oxford, np and provided patient a note stating she is not contagious

## 2016-08-06 ENCOUNTER — Encounter (HOSPITAL_COMMUNITY): Payer: Self-pay | Admitting: Emergency Medicine

## 2016-08-06 ENCOUNTER — Ambulatory Visit (INDEPENDENT_AMBULATORY_CARE_PROVIDER_SITE_OTHER): Payer: Self-pay

## 2016-08-06 ENCOUNTER — Ambulatory Visit (HOSPITAL_COMMUNITY)
Admission: EM | Admit: 2016-08-06 | Discharge: 2016-08-06 | Disposition: A | Discharge status: Home / Self Care | Payer: Self-pay | Attending: Family Medicine | Admitting: Family Medicine

## 2016-08-06 DIAGNOSIS — J40 Bronchitis, not specified as acute or chronic: Secondary | ICD-10-CM

## 2016-08-06 MED ORDER — LEVOFLOXACIN 500 MG PO TABS: 500.0000 mg | ORAL_TABLET | Freq: Every day | ORAL | 0 refills | Status: DC

## 2016-08-06 MED ORDER — PREDNISONE 20 MG PO TABS: ORAL_TABLET | ORAL | 0 refills | Status: DC

## 2016-08-06 MED ORDER — HYDROCOD POLST-CPM POLST ER 10-8 MG/5ML PO SUER
5.0000 mL | Freq: Two times a day (BID) | ORAL | 0 refills | Status: DC | PRN
Start: 2016-08-06 — End: 2016-08-27

## 2016-08-06 NOTE — ED Provider Notes (Signed)
[place]    CSN: [id number] Arrival date & time: 08/06/16  1527     History   Chief Complaint Chief Complaint  Patient presents with  . Cough    HPI Erika [name] is a 61 y.o. female.   The patient presented to the [facility] with a complaint of a cough and fever off and on x 9 days. The patient also complained of general body aches.  Patient works as a Estate agent at [employer]. She states that 4 weeks ago she had shingles and subsequent to that her husband came home with a bad cold. The last 9-10 days patient has had an uncontrollable cough preventing her from sleeping or lying down flat. She's had intermittent fever, sinus congestion, ear pain.  Patient has a history of asthma.        Past Medical History:  Diagnosis Date  . Anxiety   . Menopausal syndrome   . Neuroma of foot   . OSA on CPAP     Patient Active Problem List   Diagnosis Date Noted  . Routine general medical examination at a health care facility 06/14/2014  . Cough 05/04/2013  . Fatigue 01/14/2012  . Skin tag 08/23/2011  . Obstructive sleep apnea 11/30/2008  . ANXIETY 01/15/2008  . 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 BENIGN POSITIONAL VERTIGO 11/21/2006  . ALLERGIC RHINITIS 11/21/2006    Past Surgical History:  Procedure Laterality Date  . arm surgery     right elbow and arm  . FOOT SURGERY     neuroma surg on left foot    OB History    No data available       Home Medications    Prior to Admission medications   Medication Sig Start Date End Date Taking? Authorizing Provider  citalopram (CELEXA) 40 MG tablet TAKE 2 TABLETS AT BEDTIME 01/02/16  Yes Dorena Cookey, MD  chlorpheniramine-HYDROcodone [facility] ER) 10-8 MG/5ML SUER Take 5 mLs by mouth every 12 (twelve) hours as needed for cough. 08/06/16   Robyn Haber, MD  levofloxacin (LEVAQUIN) 500 MG tablet Take 1 tablet (500 mg total) by mouth daily. 08/06/16   Robyn Haber, MD  predniSONE  (DELTASONE) 20 MG tablet Two daily with food 08/06/16   Robyn Haber, MD    Family History Family History  Problem Relation Age of Onset  . Diabetes Mother   . Heart disease Mother   . Diabetes Maternal Grandmother   . Diabetes Maternal Grandfather   . Asthma Sister   . Cancer Paternal Grandmother   . Cancer Maternal Uncle   . Cancer Maternal Aunt   . Cancer Brother     Social History Social History  Substance Use Topics  . Smoking status: Former Smoker    Packs/day: 1.00    Years: 30.00    Types: Cigarettes    Quit date: 06/01/2000  . Smokeless tobacco: Never Used  . Alcohol use Yes     Comment: rare     Allergies   Tetracycline hcl   Review of Systems Review of Systems  Constitutional: Positive for chills, diaphoresis, fatigue and fever.  HENT: Positive for congestion, sinus pressure, sneezing and sore throat.   Respiratory: Positive for cough and shortness of breath.   Cardiovascular: Negative.   Gastrointestinal: Negative.   Neurological: Negative.      Physical Exam Triage Vital Signs ED Triage Vitals  Enc Vitals Group     BP 08/06/16 1547 (!) 167/84  Pulse Rate 08/06/16 1547 90     Resp 08/06/16 1547 18     Temp 08/06/16 1547 99 F (37.2 C)     Temp Source 08/06/16 1547 Oral     SpO2 08/06/16 1547 99 %     Weight --      Height --      Head Circumference --      Peak Flow --      Pain Score 08/06/16 1546 4     Pain Loc --      Pain Edu? --      Excl. in [place]? --    No data found.   Updated Vital Signs BP (!) 167/84 (BP Location: Right Arm)   Pulse 90   Temp 99 F (37.2 C) (Oral)   Resp 18   SpO2 99%    Physical Exam  Constitutional: She is oriented to person, place, and time. She appears well-developed and well-nourished.  HENT:  Right Ear: External ear normal.  Left Ear: External ear normal.  Mouth/Throat: Oropharynx is clear and moist.  Left tympanic membrane is erythematous streak and is dull without normal light reflex.    Eyes: Conjunctivae and EOM are normal. Pupils are equal, round, and reactive to light.  Neck: Normal range of motion. Neck supple.  Cardiovascular: Normal rate, regular rhythm and normal heart sounds.   Pulmonary/Chest: Effort normal. She has wheezes. She has rales.  Musculoskeletal: Normal range of motion.  Neurological: She is alert and oriented to person, place, and time.  Skin: Skin is warm and dry.  Nursing note and vitals reviewed.    UC Treatments / Results  Labs (all labs ordered are listed, but only abnormal results are displayed) Labs Reviewed - No data to display  EKG  EKG Interpretation None       Radiology Peribronchial cuffing with no definite infiltrate on chest x-ray Procedures Procedures (including critical care time)  Medications Ordered in UC Medications - No data to display   Initial Impression / Assessment and Plan / UC Course  I have reviewed the triage vital signs and the nursing notes.  Pertinent labs & imaging results that were available during my care of the patient were reviewed by me and considered in my medical decision making (see chart for details).     Final Clinical Impressions(s) / UC Diagnoses   Final diagnoses:  Bronchitis    New Prescriptions New Prescriptions   CHLORPHENIRAMINE-HYDROCODONE (TUSSIONEX PENNKINETIC ER) 10-8 MG/5ML SUER    Take 5 mLs by mouth every 12 (twelve) hours as needed for cough.   LEVOFLOXACIN (LEVAQUIN) 500 MG TABLET    Take 1 tablet (500 mg total) by mouth daily.   PREDNISONE (DELTASONE) 20 MG TABLET    Two daily with food     Robyn Haber, MD 08/06/16 1622

## 2016-08-06 NOTE — ED Triage Notes (Signed)
The patient presented to the [facility] with a complaint of a cough and fever off and on x 9 days. The patient also complained of general body aches.

## 2016-08-06 NOTE — Discharge Instructions (Signed)
You should be significantly better in 48 hours. If you're not improving, please return so we can reevaluate. The x-ray does not show pneumonia but rather a severe bronchitis infection.

## 2016-08-27 ENCOUNTER — Ambulatory Visit (INDEPENDENT_AMBULATORY_CARE_PROVIDER_SITE_OTHER): Payer: Self-pay | Admitting: Family Medicine

## 2016-08-27 ENCOUNTER — Encounter: Payer: Self-pay | Admitting: Family Medicine

## 2016-08-27 VITALS — BP 120/80 | HR 62 | Temp 97.3°F | Wt 231.6 lb

## 2016-08-27 DIAGNOSIS — Z1239 Encounter for other screening for malignant neoplasm of breast: Secondary | ICD-10-CM

## 2016-08-27 DIAGNOSIS — R5383 Other fatigue: Secondary | ICD-10-CM

## 2016-08-27 DIAGNOSIS — Z1231 Encounter for screening mammogram for malignant neoplasm of breast: Secondary | ICD-10-CM

## 2016-08-27 DIAGNOSIS — G8929 Other chronic pain: Secondary | ICD-10-CM

## 2016-08-27 DIAGNOSIS — Z0001 Encounter for general adult medical examination with abnormal findings: Secondary | ICD-10-CM

## 2016-08-27 DIAGNOSIS — R7989 Other specified abnormal findings of blood chemistry: Secondary | ICD-10-CM

## 2016-08-27 DIAGNOSIS — M25512 Pain in left shoulder: Secondary | ICD-10-CM

## 2016-08-27 DIAGNOSIS — E785 Hyperlipidemia, unspecified: Secondary | ICD-10-CM

## 2016-08-27 DIAGNOSIS — R946 Abnormal results of thyroid function studies: Secondary | ICD-10-CM

## 2016-08-27 DIAGNOSIS — G4733 Obstructive sleep apnea (adult) (pediatric): Secondary | ICD-10-CM

## 2016-08-27 NOTE — Patient Instructions (Addendum)
It was a pleasure to see you today! Please follow up for lab work as discussed. Follow up with Dr. Sherren Mocha as needed. Lab work will determine if earlier return to clinic for evaluation is needed.  Your mammogram has been ordered for you as well.  Health Maintenance, Female Adopting a healthy lifestyle and getting preventive care can go a long way to promote health and wellness. Talk with your health care provider about what schedule of regular examinations is right for you. This is a good chance for you to check in with your provider about disease prevention and staying healthy. In between checkups, there are plenty of things you can do on your own. Experts have done a lot of research about which lifestyle changes and preventive measures are most likely to keep you healthy. Ask your health care provider for more information. Weight and diet Eat a healthy diet  Be sure to include plenty of vegetables, fruits, low-fat dairy products, and lean protein.  Do not eat a lot of foods high in solid fats, added sugars, or salt.  Get regular exercise. This is one of the most important things you can do for your health.  Most adults should exercise for at least 150 minutes each week. The exercise should increase your heart rate and make you sweat (moderate-intensity exercise).  Most adults should also do strengthening exercises at least twice a week. This is in addition to the moderate-intensity exercise. Maintain a healthy weight  Body mass index (BMI) is a measurement that can be used to identify possible weight problems. It estimates body fat based on height and weight. Your health care provider can help determine your BMI and help you achieve or maintain a healthy weight.  For females 80 years of age and older:  A BMI below 18.5 is considered underweight.  A BMI of 18.5 to 24.9 is normal.  A BMI of 25 to 29.9 is considered overweight.  A BMI of 30 and above is considered obese. Watch levels of  cholesterol and blood lipids  You should start having your blood tested for lipids and cholesterol at 62 years of age, then have this test every 5 years.  You may need to have your cholesterol levels checked more often if:  Your lipid or cholesterol levels are high.  You are older than 62 years of age.  You are at high risk for heart disease. Cancer screening Lung Cancer  Lung cancer screening is recommended for adults 39-89 years old who are at high risk for lung cancer because of a history of smoking.  A yearly low-dose CT scan of the lungs is recommended for people who:  Currently smoke.  Have quit within the past 15 years.  Have at least a 30-pack-year history of smoking. A pack year is smoking an average of one pack of cigarettes a day for 1 year.  Yearly screening should continue until it has been 15 years since you quit.  Yearly screening should stop if you develop a health problem that would prevent you from having lung cancer treatment. Breast Cancer  Practice breast self-awareness. This means understanding how your breasts normally appear and feel.  It also means doing regular breast self-exams. Let your health care provider know about any changes, no matter how small.  If you are in your 20s or 30s, you should have a clinical breast exam (CBE) by a health care provider every 1-3 years as part of a regular health exam.  If you are  79 or older, have a CBE every year. Also consider having a breast X-ray (mammogram) every year.  If you have a family history of breast cancer, talk to your health care provider about genetic screening.  If you are at high risk for breast cancer, talk to your health care provider about having an MRI and a mammogram every year.  Breast cancer gene (BRCA) assessment is recommended for women who have family members with BRCA-related cancers. BRCA-related cancers include:  Breast.  Ovarian.  Tubal.  Peritoneal cancers.  Results of  the assessment will determine the need for genetic counseling and BRCA1 and BRCA2 testing. Cervical Cancer  Your health care provider may recommend that you be screened regularly for cancer of the pelvic organs (ovaries, uterus, and vagina). This screening involves a pelvic examination, including checking for microscopic changes to the surface of your cervix (Pap test). You may be encouraged to have this screening done every 3 years, beginning at age 21.  For women ages 38-65, health care providers may recommend pelvic exams and Pap testing every 3 years, or they may recommend the Pap and pelvic exam, combined with testing for human papilloma virus (HPV), every 5 years. Some types of HPV increase your risk of cervical cancer. Testing for HPV may also be done on women of any age with unclear Pap test results.  Other health care providers may not recommend any screening for nonpregnant women who are considered low risk for pelvic cancer and who do not have symptoms. Ask your health care provider if a screening pelvic exam is right for you.  If you have had past treatment for cervical cancer or a condition that could lead to cancer, you need Pap tests and screening for cancer for at least 20 years after your treatment. If Pap tests have been discontinued, your risk factors (such as having a new sexual partner) need to be reassessed to determine if screening should resume. Some women have medical problems that increase the chance of getting cervical cancer. In these cases, your health care provider may recommend more frequent screening and Pap tests. Colorectal Cancer  This type of cancer can be detected and often prevented.  Routine colorectal cancer screening usually begins at 62 years of age and continues through 62 years of age.  Your health care provider may recommend screening at an earlier age if you have risk factors for colon cancer.  Your health care provider may also recommend using home test  kits to check for hidden blood in the stool.  A small camera at the end of a tube can be used to examine your colon directly (sigmoidoscopy or colonoscopy). This is done to check for the earliest forms of colorectal cancer.  Routine screening usually begins at age 61.  Direct examination of the colon should be repeated every 5-10 years through 62 years of age. However, you may need to be screened more often if early forms of precancerous polyps or small growths are found. Skin Cancer  Check your skin from head to toe regularly.  Tell your health care provider about any new moles or changes in moles, especially if there is a change in a mole's shape or color.  Also tell your health care provider if you have a mole that is larger than the size of a pencil eraser.  Always use sunscreen. Apply sunscreen liberally and repeatedly throughout the day.  Protect yourself by wearing long sleeves, pants, a wide-brimmed hat, and sunglasses whenever you are outside.  Heart disease, diabetes, and high blood pressure  High blood pressure causes heart disease and increases the risk of stroke. High blood pressure is more likely to develop in:  People who have blood pressure in the high end of the normal range (130-139/85-89 mm Hg).  People who are overweight or obese.  People who are African American.  If you are 36-67 years of age, have your blood pressure checked every 3-5 years. If you are 33 years of age or older, have your blood pressure checked every year. You should have your blood pressure measured twice-once when you are at a hospital or clinic, and once when you are not at a hospital or clinic. Record the average of the two measurements. To check your blood pressure when you are not at a hospital or clinic, you can use:  An automated blood pressure machine at a pharmacy.  A home blood pressure monitor.  If you are between 43 years and 7 years old, ask your health care provider if you should  take aspirin to prevent strokes.  Have regular diabetes screenings. This involves taking a blood sample to check your fasting blood sugar level.  If you are at a normal weight and have a low risk for diabetes, have this test once every three years after 62 years of age.  If you are overweight and have a high risk for diabetes, consider being tested at a younger age or more often. Preventing infection Hepatitis B  If you have a higher risk for hepatitis B, you should be screened for this virus. You are considered at high risk for hepatitis B if:  You were born in a country where hepatitis B is common. Ask your health care provider which countries are considered high risk.  Your parents were born in a high-risk country, and you have not been immunized against hepatitis B (hepatitis B vaccine).  You have HIV or AIDS.  You use needles to inject street drugs.  You live with someone who has hepatitis B.  You have had sex with someone who has hepatitis B.  You get hemodialysis treatment.  You take certain medicines for conditions, including cancer, organ transplantation, and autoimmune conditions. Hepatitis C  Blood testing is recommended for:  Everyone born from [birth year] through 1966.  Anyone with known risk factors for hepatitis C. Sexually transmitted infections (STIs)  You should be screened for sexually transmitted infections (STIs) including gonorrhea and chlamydia if:  You are sexually active and are younger than 62 years of age.  You are older than 62 years of age and your health care provider tells you that you are at risk for this type of infection.  Your sexual activity has changed since you were last screened and you are at an increased risk for chlamydia or gonorrhea. Ask your health care provider if you are at risk.  If you do not have HIV, but are at risk, it may be recommended that you take a prescription medicine daily to prevent HIV infection. This is called  pre-exposure prophylaxis (PrEP). You are considered at risk if:  You are sexually active and do not regularly use condoms or know the HIV status of your partner(s).  You take drugs by injection.  You are sexually active with a partner who has HIV. Talk with your health care provider about whether you are at high risk of being infected with HIV. If you choose to begin PrEP, you should first be tested for HIV. You should then  be tested every 3 months for as long as you are taking PrEP. Pregnancy  If you are premenopausal and you may become pregnant, ask your health care provider about preconception counseling.  If you may become pregnant, take 400 to 800 micrograms (mcg) of folic acid every day.  If you want to prevent pregnancy, talk to your health care provider about birth control (contraception). Osteoporosis and menopause  Osteoporosis is a disease in which the bones lose minerals and strength with aging. This can result in serious bone fractures. Your risk for osteoporosis can be identified using a bone density scan.  If you are 56 years of age or older, or if you are at risk for osteoporosis and fractures, ask your health care provider if you should be screened.  Ask your health care provider whether you should take a calcium or vitamin D supplement to lower your risk for osteoporosis.  Menopause may have certain physical symptoms and risks.  Hormone replacement therapy may reduce some of these symptoms and risks. Talk to your health care provider about whether hormone replacement therapy is right for you. Follow these instructions at home:  Schedule regular health, dental, and eye exams.  Stay current with your immunizations.  Do not use any tobacco products including cigarettes, chewing tobacco, or electronic cigarettes.  If you are pregnant, do not drink alcohol.  If you are breastfeeding, limit how much and how often you drink alcohol.  Limit alcohol intake to no more  than 1 drink per day for nonpregnant women. One drink equals 12 ounces of beer, 5 ounces of wine, or 1 ounces of hard liquor.  Do not use street drugs.  Do not share needles.  Ask your health care provider for help if you need support or information about quitting drugs.  Tell your health care provider if you often feel depressed.  Tell your health care provider if you have ever been abused or do not feel safe at home. This information is not intended to replace advice given to you by your health care provider. Make sure you discuss any questions you have with your health care provider. Document Released: 10/16/2010 Document Revised: 09/08/2015 Document Reviewed: 01/04/2015 Elsevier Interactive Patient Education  2017 [employer].

## 2016-08-27 NOTE — Progress Notes (Signed)
Subjective:    Patient ID: Erika [name], female    DOB: 1955/06/18, 61 y.o.   MRN: [id number]  HPI  Erika [name] is a 61 year old female who presents today for a physical exam as she has an upcoming surgical procedure for a torn rotator cuff in her left shoulder that will be scheduled after physical exam. I am seeing her today in place of her provider who is unavailable. She is a former smoker and quit 06/01/2000.   She has OSA and is currently on CPAP. She is due for this at this time and will follow up with pulmonology for evaluation and treatment.   No influenza vaccine this year. Tdap is UTD. She has not had Shingles vaccine but will do this later as she states that her visit today is associated with worker's compensation and she is concerned that this may not be covered.  Depression/anxiety: She reports excellent control with citalopram and denies any adverse effects.   History of fatigue that has remained unchanged. She stated that she has felt more tired due to pain with shoulder pain. She denies chest pain, palpitations, SOB, N/V, jaw pain, arm pain, diaphoresis, no unusual bleeding/bruising.  Left shoulder pain is rated as a 7 and noted as stabbing and worse with movement. Radiation of pain down left arm. She denies numbness, tingling, or weakness. She reports that orthopedic provider states she has lost strength in this arm.  Last mammogram was 2 years ago. Last colonoscopy in 2014: Recall in 5 years due to polyps.   Review of Systems Constitutional: No fever, chills, significant weight change, fatigue, weakness or night sweats Eyes: No redness, discharge, pain, blurred vision, double vision, or loss of vision ENT/mouth: No nasal congestion, postnasal drainage,epistaxis, purulent discharge, earache, hearing loss, tinnitus ,sore throat , dental pain, or hoarseness   Cardiovascular: no chest pain, palpitations, racing, irregular rhythm, syncope, nausea, sweating, claudication, or  edema  Respiratory: No cough, sputum production,hemoptysis,  dyspnea, paroxysmal nocturnal dyspnea, pleuritic chest pain, significant snoring, or  apnea    Gastrointestinal: No heartburn,dysphagia, nausea and vomiting,ominal pain, change in bowels, anorexia, diarrhea, significant constipation, rectal bleeding, melena,  stool incontinence or jaundice Genitourinary: No dysuria,hematuria, pyuria, frequency, urgency,  incontinence, nocturia, dark urine or flank pain Musculoskeletal: No myalgias or muscle cramping, joint stiffness, joint swelling, joint color change, weakness, or cyanosis. Left shoulder pain Dermatologic: No rash, pruritus, urticaria, or change in color or temperature of skin Neurologic: No headache, vertigo, limb weakness, tremor, gait disturbance, seizures, memory loss, numbness or tingling Psychiatric: No significant anxiety or depression, anhedonia, panic attacks, insomnia, or anorexia Endocrine: No change in hair/skin/ nails, excessive thirst, excessive hunger, excessive urination, or unexplained fatigue Hematologic/lymphatic: No bruising, lymphadenopathy,or  abnormal clotting Allergy/immunology: No itchy/ watery eyes, abnormal sneezing, rhinitis, urticaria ,or angioedema  Past Medical History:  Diagnosis Date  . Anxiety   . Menopausal syndrome   . Neuroma of foot   . OSA on CPAP      Social History   Social History  . Marital status: Married    Spouse name: N/A  . Number of children: N/A  . Years of education: N/A   Occupational History  . [employer] Supervisor [employer]   Social History Main Topics  . Smoking status: Former Smoker    Packs/day: 1.00    Years: 30.00    Types: Cigarettes    Quit date: 06/01/2000  . Smokeless tobacco: Never Used  . Alcohol use No  Comment: rare  . Drug use: No  . Sexual activity: Not on file   Other Topics Concern  . Not on file   Social History Narrative  . No narrative on file    Past Surgical History:    Procedure Laterality Date  . arm surgery     right elbow and arm  . FOOT SURGERY     neuroma surg on left foot    Family History  Problem Relation Age of Onset  . Diabetes Mother   . Heart disease Mother   . Diabetes Maternal Grandmother   . Diabetes Maternal Grandfather   . Asthma Sister   . Cancer Paternal Grandmother   . Cancer Maternal Uncle   . Cancer Maternal Aunt   . Cancer Brother     Allergies  Allergen Reactions  . Tetracycline Hcl     rash    Current Outpatient Prescriptions on File Prior to Visit  Medication Sig Dispense Refill  . citalopram (CELEXA) 40 MG tablet TAKE 2 TABLETS AT BEDTIME 180 tablet 3   No current facility-administered medications on file prior to visit.     BP 120/80 (BP Location: Left Arm, Patient Position: Sitting, Cuff Size: Normal)   Pulse 62   Temp 97.3 F (36.3 C) (Oral)   Wt 231 lb 9.6 oz (105.1 kg)   SpO2 96%   BMI 37.38 kg/m       Objective:   Physical Exam  Physical Exam  Constitutional: She is oriented to person, place, and time. She appears well-developed and well-nourished. No distress.  HENT:  Head: Normocephalic and atraumatic.  Right Ear: Tympanic membrane and ear canal normal.  Left Ear: Tympanic membrane and ear canal normal.  Mouth/Throat: Oropharynx is clear and moist.  Eyes: Pupils are equal, round, and reactive to light. No scleral icterus.  Neck: Normal range of motion. No thyromegaly present.  Cardiovascular: Normal rate and regular rhythm.   No murmur heard. Pulmonary/Chest: Effort normal and breath sounds normal. No respiratory distress. He has no wheezes. She has no rales. She exhibits no tenderness.  Abdominal: Soft. Bowel sounds are normal. She exhibits no distension and no mass. There is no tenderness. There is no rebound and no guarding.  Musculoskeletal: She exhibits no edema. Left shoulder with limited ROM due to pain associated with rotator cuff. Did not complete impingement tests as she is  followed by orthopedics for this and is scheduling an upcoming surgery. Lymphadenopathy:  She has no cervical adenopathy.  Neurological: She is alert and oriented to person, place, and time. She has normal patellar reflexes. She exhibits normal muscle tone. Decreased strength noted with upper left extremity with history of torn rotator cuff.  Coordination normal.  Skin: Skin is warm and dry.  Psychiatric: She has a normal mood and affect. Her behavior is normal. Judgment and thought content normal.  Breasts: Examined lying Right: Without masses, retractions, discharge or axillary adenopathy.  Left: Without masses, retractions, discharge or axillary adenopathy.      Assessment & Plan:  1. Encounter for general adult medical examination with abnormal findings 61 y.o. female presenting for annual physical.  Health Maintenance counseling: 1. Anticipatory guidance: Patient counseled regarding regular dental exams, eye exams, wearing seatbelts.  2. Risk factor reduction:  Advised patient of need for regular exercise and diet rich and fruits and vegetables to reduce risk of heart attack and stroke.  3. Immunizations/screenings/ancillary studies: She is not interested in Shingles vaccine today.  Immunization History  Administered Date(s)  Administered  . Influenza Split 05/15/2011, 01/25/2014  . Influenza Whole 01/06/2008, 02/03/2009  . Influenza,inj,Quad PF,36+ Mos 02/05/2013  . Td 04/16/2004  . Tdap 06/14/2014   Health Maintenance Due  Topic Date Due  . Hepatitis C Screening  Feb 18, 1956  . HIV Screening  12/24/1969  . PAP SMEAR  03/25/2016   4. Cervical cancer screening- Not needed 5. Breast cancer screening-  breast exam today is unremarkable  and mammogram has been ordered 6. Colon cancer screening - Due in 2019 7. Skin cancer screening- No concerning or suspicious lesions noted by patient today. Advised use of sunscreen.  2. Fatigue, unspecified type Improving; exam is reassuring;  suspect that symptoms is related to chronic shoulder pain  EKG Interpretation Date/Time:08/27/16   Time:  15:55 Ventricular Rate: 73 PR Interval: 158 msec QRS Duration: 106 msec QT Interval: 398 msec Text Interpretation: NSR  - EKG 12-Lead - CBC with Differential/Platelet; Future - Basic metabolic panel; Future - Hepatic function panel; Future - Hemoglobin A1c; Future  3. Chronic left shoulder pain Torn rotator cuff diagnosed by orthopedic provider. She is awaiting surgical clearance after review of physical and lab findings.   4. Obstructive sleep apnea She will follow up with pulmonology. We reviewed the importance of close follow up and notifying surgical provider of OSA history.  5. Hyperlipidemia, unspecified hyperlipidemia type Will obtain lab work today. She does not follow a particular diet. Encouraged heart healthy diet. - Lipid panel; Future  6. Abnormal TSH History of abnormal TSH; will check lab work today. - TSH; Future  7. Screening for breast cancer  - MM Digital Screening; Future  Follow up for fasting labs and with Dr. Sherren Mocha as needed.  Delano Metz, FNP-C

## 2016-08-28 LAB — BASIC METABOLIC PANEL
BUN: 13 mg/dL (ref 6–23)
CHLORIDE: 105 meq/L (ref 96–112)
CO2: 30 meq/L (ref 19–32)
CREATININE: 0.78 mg/dL (ref 0.40–1.20)
Calcium: 9.1 mg/dL (ref 8.4–10.5)
GFR: 79.62 mL/min (ref >60.00)
Glucose, Bld: 91 mg/dL (ref 70–99)
Potassium: 4.4 mEq/L (ref 3.5–5.1)
Sodium: 140 mEq/L (ref 135–145)

## 2016-08-28 LAB — CBC WITH DIFFERENTIAL/PLATELET
Basophils Absolute: 0 10*3/uL (ref 0.0–0.1)
Basophils Relative: 0.4 % (ref 0.0–3.0)
EOS PCT: 2.4 % (ref 0.0–5.0)
Eosinophils Absolute: 0.1 10*3/uL (ref 0.0–0.7)
HCT: 36.3 % (ref 36.0–46.0)
Hemoglobin: 11.9 g/dL — ABNORMAL LOW (ref 12.0–15.0)
LYMPHS ABS: 1.6 10*3/uL (ref 0.7–4.0)
Lymphocytes Relative: 31 % (ref 12.0–46.0)
MCHC: 32.8 g/dL (ref 30.0–36.0)
MCV: 83.7 fl (ref 78.0–100.0)
MONOS PCT: 8.1 % (ref 3.0–12.0)
Monocytes Absolute: 0.4 10*3/uL (ref 0.1–1.0)
NEUTROS ABS: 3 10*3/uL (ref 1.4–7.7)
NEUTROS PCT: 58.1 % (ref 43.0–77.0)
Platelets: 289 10*3/uL (ref 150.0–400.0)
RBC: 4.34 Mil/uL (ref 3.87–5.11)
RDW: 15.8 % — ABNORMAL HIGH (ref 11.5–15.5)
WBC: 5.1 10*3/uL (ref 4.0–10.5)

## 2016-08-28 LAB — LIPID PANEL
CHOLESTEROL: 249 mg/dL — AB (ref 0–200)
HDL: 53.2 mg/dL (ref >39.00)
LDL CALC: 170 mg/dL — AB (ref 0–99)
NONHDL: 195.39
Total CHOL/HDL Ratio: 5
Triglycerides: 125 mg/dL (ref 0.0–149.0)
VLDL: 25 mg/dL (ref 0.0–40.0)

## 2016-08-28 LAB — HEPATIC FUNCTION PANEL
ALT: 16 U/L (ref 0–35)
AST: 17 U/L (ref 0–37)
Albumin: 3.9 g/dL (ref 3.5–5.2)
Alkaline Phosphatase: 60 U/L (ref 39–117)
Bilirubin, Direct: 0.1 mg/dL (ref 0.0–0.3)
TOTAL PROTEIN: 6.4 g/dL (ref 6.0–8.3)
Total Bilirubin: 0.4 mg/dL (ref 0.2–1.2)

## 2016-08-28 LAB — HEMOGLOBIN A1C: HEMOGLOBIN A1C: 5.8 % (ref 4.6–6.5)

## 2016-08-28 LAB — TSH: TSH: 6.76 u[IU]/mL — AB (ref 0.35–4.50)

## 2016-08-28 NOTE — Addendum Note (Signed)
Addended by: Elmer Picker on: 08/28/2016 07:36 AM   Modules accepted: Orders

## 2016-08-29 ENCOUNTER — Other Ambulatory Visit: Payer: Self-pay

## 2016-08-29 DIAGNOSIS — E039 Hypothyroidism, unspecified: Secondary | ICD-10-CM

## 2016-08-29 MED ORDER — LEVOTHYROXINE SODIUM 25 MCG PO TABS: 25.0000 ug | ORAL_TABLET | Freq: Every day | ORAL | 1 refills | Status: DC

## 2016-09-03 ENCOUNTER — Ambulatory Visit: Payer: Self-pay | Admitting: Family Medicine

## 2016-09-14 HISTORY — PX: SHOULDER ARTHROSCOPY W/ ROTATOR CUFF REPAIR: SHX2400

## 2016-10-03 ENCOUNTER — Other Ambulatory Visit: Payer: Self-pay | Admitting: Family Medicine

## 2016-10-03 DIAGNOSIS — E039 Hypothyroidism, unspecified: Secondary | ICD-10-CM

## 2016-10-03 MED ORDER — LEVOTHYROXINE SODIUM 25 MCG PO TABS: 25.0000 ug | ORAL_TABLET | Freq: Every day | ORAL | 0 refills | Status: DC

## 2016-10-03 NOTE — Telephone Encounter (Signed)
Received request from the pharmacy.  Pt requesting a 90 day supply.  Sent in for #90 only.  0 refills.  Pt should come back in a few weeks for lab work under [facility].

## 2016-10-26 ENCOUNTER — Other Ambulatory Visit: Payer: Self-pay | Admitting: Family Medicine

## 2016-10-26 DIAGNOSIS — E039 Hypothyroidism, unspecified: Secondary | ICD-10-CM

## 2016-12-25 ENCOUNTER — Ambulatory Visit (INDEPENDENT_AMBULATORY_CARE_PROVIDER_SITE_OTHER): Payer: Self-pay | Admitting: Family Medicine

## 2016-12-25 ENCOUNTER — Encounter: Payer: Self-pay | Admitting: Family Medicine

## 2016-12-25 VITALS — BP 120/82 | HR 80 | Temp 97.8°F | Wt 226.0 lb

## 2016-12-25 DIAGNOSIS — H60542 Acute eczematoid otitis externa, left ear: Secondary | ICD-10-CM

## 2016-12-25 MED ORDER — FLUOCINONIDE 0.05 % EX SOLN: 1.0000 "application " | Freq: Two times a day (BID) | CUTANEOUS | 0 refills | Status: DC

## 2016-12-25 NOTE — Patient Instructions (Addendum)
2 drops left ear canal bedtime........... pack with cotton.........Marland Kitchen remove the cotton in the morning............. dizziness at bedtime for 10 nights  No more Q-tips

## 2016-12-25 NOTE — Progress Notes (Signed)
Alverta is a 61 year old married female nonsmoker who comes in with a week's history of left ear pain  She's been using Q-tips for many years  A week ago she knows some itching continued use the Q-tips and now she's been having some pain in that ear canal. No discharge no fever etc.  Hearing normal  BP 120/82 (BP Location: Left Arm, Patient Position: Sitting, Cuff Size: Normal)   Pulse 80   Temp 97.8 F (36.6 C) (Oral)   Wt 226 lb (102.5 kg)   BMI 36.48 kg/m  General she is a well-developed well-nourished female no acute distress examination right ear canal was normal except for dry ear canal. Left ear canal was red and inflamed consistent with a irritant type dermatitis.  Newborn dermatitis left ear canal.......... use cortisone drops........ discontinue Q-tips

## 2017-01-07 ENCOUNTER — Other Ambulatory Visit: Payer: Self-pay | Admitting: Family Medicine

## 2017-04-02 ENCOUNTER — Encounter: Payer: Self-pay | Admitting: Family Medicine

## 2017-04-02 ENCOUNTER — Ambulatory Visit (INDEPENDENT_AMBULATORY_CARE_PROVIDER_SITE_OTHER): Payer: Self-pay | Admitting: Family Medicine

## 2017-04-02 VITALS — BP 110/80 | HR 79 | Temp 98.1°F | Ht 66.0 in | Wt 226.1 lb

## 2017-04-02 DIAGNOSIS — M542 Cervicalgia: Secondary | ICD-10-CM

## 2017-04-02 NOTE — Progress Notes (Signed)
HPI:  Acute visit for neck pain: -Started 1 week ago when she woke up and has been worsening, much worse today -Pain is now severe to the point that she cannot turn her neck to the right without severe pain in the neck -She has tried muscle Robaxin, tramadol and 6 ibuprofen on schedule the last few days to see if this will help -this is not helped -He denies radiation of pain to the arms, weakness, numbness, headache, known trauma recently, fevers, malaise, illness -She has a remote history of coronary accident and neck issues in the past -She is seen by [facility] orthopedics for other orthopedic issues  ROS: See pertinent positives and negatives per HPI.  Past Medical History:  Diagnosis Date  . Anxiety   . Menopausal syndrome   . Neuroma of foot   . OSA on CPAP     Past Surgical History:  Procedure Laterality Date  . arm surgery     right elbow and arm  . FOOT SURGERY     neuroma surg on left foot    Family History  Problem Relation Age of Onset  . Diabetes Mother   . Heart disease Mother   . Diabetes Maternal Grandmother   . Diabetes Maternal Grandfather   . Asthma Sister   . Cancer Paternal Grandmother   . Cancer Maternal Uncle   . Cancer Maternal Aunt   . Cancer Brother     Social History   Socioeconomic History  . Marital status: Married    Spouse name: None  . Number of children: None  . Years of education: None  . Highest education level: None  Social Needs  . Financial resource strain: None  . Food insecurity - worry: None  . Food insecurity - inability: None  . Transportation needs - medical: None  . Transportation needs - non-medical: None  Occupational History  . Occupation: Solicitor: [employer]  Tobacco Use  . Smoking status: Former Smoker    Packs/day: 1.00    Years: 30.00    Pack years: 30.00    Types: Cigarettes    Last attempt to quit: 06/01/2000    Years since quitting: 16.8  . Smokeless tobacco: Never  Used  Substance and Sexual Activity  . Alcohol use: No    Comment: rare  . Drug use: No  . Sexual activity: None  Other Topics Concern  . None  Social History Narrative  . None     Current Outpatient Medications:  .  citalopram (CELEXA) 40 MG tablet, TAKE 2 TABLETS BY MOUTH AT BEDTIME, Disp: 180 tablet, Rfl: 3 .  fluocinonide (LIDEX) 0.05 % external solution, Apply 1 application topically 2 (two) times daily., Disp: 60 mL, Rfl: 0 .  levothyroxine (SYNTHROID, LEVOTHROID) 25 MCG tablet, TAKE 1 TABLET BY MOUTH EVERY DAY, Disp: 30 tablet, Rfl: 6 .  methocarbamol (ROBAXIN) 750 MG tablet, Take 750 mg by mouth., Disp: , Rfl:  .  traMADol (ULTRAM) 50 MG tablet, Take 50 mg by mouth every 6 (six) hours as needed., Disp: , Rfl:   EXAM:  Vitals:   04/02/17 1121  BP: 110/80  Pulse: 79  Temp: 98.1 F (36.7 C)    Body mass index is 36.49 kg/m.  GENERAL: vitals reviewed and listed above, alert, oriented, appears well hydrated and in no acute distress  MS: moves all extremities without noticeable abnormality -exam is limited, she starts to cry when she tries to turn her head for  range of motion testing reporting point tenderness in the right muscles in the mid cervical region, normal functioning of the upper extremities bilaterally  PSYCH: pleasant and cooperative, no obvious depression or anxiety  ASSESSMENT AND PLAN:  Discussed the following assessment and plan:  Neck pain  -Her pain is severe, limited exam, pain is worsening despite a number of meds, she needs prompt imaging -we discussed possible serious and likely etiologies, workup and treatment, treatment risks and return precautions - suspect DDD -after this discussion, Aniyla opted for evaluation at her orthopedic office urgent care for prompt eval/imaging rather than the emergency room or waiting on outpt MRI through out office  - she decided to go straight there from here, her husband plans to drive her   There are no  Patient Instructions on file for this visit.  Colin Benton R., DO

## 2017-08-01 ENCOUNTER — Encounter: Payer: Self-pay | Admitting: Family Medicine

## 2017-08-01 ENCOUNTER — Ambulatory Visit (INDEPENDENT_AMBULATORY_CARE_PROVIDER_SITE_OTHER): Payer: Self-pay | Admitting: Family Medicine

## 2017-08-01 VITALS — BP 110/62 | HR 75 | Temp 98.1°F | Ht 66.0 in | Wt 234.1 lb

## 2017-08-01 DIAGNOSIS — M545 Low back pain, unspecified: Secondary | ICD-10-CM

## 2017-08-01 DIAGNOSIS — R350 Frequency of micturition: Secondary | ICD-10-CM

## 2017-08-01 LAB — POCT URINALYSIS DIPSTICK
BILIRUBIN UA: NEGATIVE
GLUCOSE UA: NEGATIVE
KETONES UA: NEGATIVE
Leukocytes, UA: NEGATIVE
Nitrite, UA: NEGATIVE
Protein, UA: NEGATIVE
Spec Grav, UA: 1.015 (ref 1.010–1.025)
Urobilinogen, UA: 0.2 E.U./dL
pH, UA: 5 (ref 5.0–8.0)

## 2017-08-01 MED ORDER — NITROFURANTOIN MONOHYD MACRO 100 MG PO CAPS: 100.0000 mg | ORAL_CAPSULE | Freq: Two times a day (BID) | ORAL | 0 refills | Status: DC

## 2017-08-01 NOTE — Patient Instructions (Addendum)
BEFORE YOU LEAVE: -Urine dip -Low back exercises -follow up: 2-4 weeks  Do the exercises at least 4 days/week  Heat 15 minutes twice daily  Tiger balm as needed  Aleve or Tylenol per instructions as needed for pain  do not ever take more than is recommended on the bottle or use multiple pain medications at the same time  I hope you are feeling better soon! Seek care promptly if your symptoms worsen, new concerns arise or you are not improving with treatment.

## 2017-08-01 NOTE — Progress Notes (Signed)
HPI:  Using dictation device. Unfortunately this device frequently misinterprets words/phrases.  Acute visit for Low back pain: -started 4-5 days ago -She cannot think of an inciting event, she did fall to the side yesterday and getting out of the truck, but her husband caught her and she does not think this worsened her pain at all -The pain has been in the bilateral low back, dull, sharp at times -She has had some urinary frequency as well and she wonders if she has a UTI -Denies radiation, weakness, numbness, bowel or bladder incontinence, hematuria, abdominal or pelvic pain, fevers, nausea or vomiting  ROS: See pertinent positives and negatives per HPI.  Past Medical History:  Diagnosis Date  . Anxiety   . Menopausal syndrome   . Neuroma of foot   . OSA on CPAP     Past Surgical History:  Procedure Laterality Date  . arm surgery     right elbow and arm  . FOOT SURGERY     neuroma surg on left foot    Family History  Problem Relation Age of Onset  . Diabetes Mother   . Heart disease Mother   . Diabetes Maternal Grandmother   . Diabetes Maternal Grandfather   . Asthma Sister   . Cancer Paternal Grandmother   . Cancer Maternal Uncle   . Cancer Maternal Aunt   . Cancer Brother     SOCIAL HX: See HPI    Current Outpatient Medications:  .  citalopram (CELEXA) 40 MG tablet, TAKE 2 TABLETS BY MOUTH AT BEDTIME, Disp: 180 tablet, Rfl: 3 .  fluocinonide (LIDEX) 0.05 % external solution, Apply 1 application topically 2 (two) times daily., Disp: 60 mL, Rfl: 0 .  levothyroxine (SYNTHROID, LEVOTHROID) 25 MCG tablet, TAKE 1 TABLET BY MOUTH EVERY DAY, Disp: 30 tablet, Rfl: 6 .  methocarbamol (ROBAXIN) 750 MG tablet, Take 750 mg by mouth., Disp: , Rfl:  .  traMADol (ULTRAM) 50 MG tablet, Take 50 mg by mouth every 6 (six) hours as needed., Disp: , Rfl:   EXAM:  Vitals:   08/01/17 1545  BP: 110/62  Pulse: 75  Temp: 98.1 F (36.7 C)    Body mass index is 37.78  kg/m.  GENERAL: vitals reviewed and listed above, alert, oriented, appears well hydrated and in no acute distress  HEENT: atraumatic, conjunttiva clear, no obvious abnormalities on inspection of external nose and ears  NECK: no obvious masses on inspection  LUNGS: clear to auscultation bilaterally, no wheezes, rales or rhonchi, good air movement  CV: HRRR, no peripheral edema  A soft, no CVA tenderness  MS: moves all extremities without noticeable abnormality Normal Gait Normal inspection of back, no obvious scoliosis or leg length descrepancy No bony TTP Soft tissue TTP at: Bilateral quadratus lumborum muscles -/+ tests: neg trendelenburg,-facet loading, -SLRT, -CLRT, -FABER, -FADIR Normal muscle strength, sensation to light touch and DTRs in LEs bilaterally  PSYCH: pleasant and cooperative, no obvious depression or anxiety  ASSESSMENT AND PLAN:  Discussed the following assessment and plan:  Urinary frequency  Acute bilateral low back pain without sciatica  -Low back pain seems more likely to be musculoskeletal given exam findings, no alarm symptoms or features on exam, no neuro deficits on exam -We will check her urine (addendum: reviewed results with pt, further urine studies pending, she want abx as leaving to go out of [place] and worried if is infection she would not get results/etc, discussed options and risks, macrobid rxx sent) -We will treat the  back pain conservatively with heat, home exercise program, topical treatments and over-the-counter analgesics as needed -Follow-up in 2-4 weeks, sooner if worsening or new symptoms or she is not improving with treatment   Patient Instructions  BEFORE YOU LEAVE: -Urine dip -Low back exercises -follow up: 2-4 weeks  Do the exercises at least 4 days/week  Heat 15 minutes twice daily  Tiger balm as needed  Aleve or Tylenol per instructions as needed for pain  do not ever take more than is recommended on the bottle or use  multiple pain medications at the same time  I hope you are feeling better soon! Seek care promptly if your symptoms worsen, new concerns arise or you are not improving with treatment.     Erika Kern, DO

## 2017-08-01 NOTE — Addendum Note (Signed)
Addended by: Tomi Likens on: 08/01/2017 04:52 PM   Modules accepted: Orders

## 2017-08-02 LAB — URINE CULTURE
MICRO NUMBER:: 90478510
SPECIMEN QUALITY: ADEQUATE

## 2017-08-02 LAB — URINALYSIS, MICROSCOPIC ONLY
Bacteria, UA: NONE SEEN /HPF
HYALINE CAST: NONE SEEN /LPF
Squamous Epithelial / LPF: NONE SEEN /HPF (ref <=5)
WBC UA: NONE SEEN /HPF (ref 0–5)

## 2017-08-02 LAB — EXTRA URINE SPECIMEN

## 2017-10-22 ENCOUNTER — Other Ambulatory Visit: Payer: Self-pay | Admitting: Family Medicine

## 2017-12-19 ENCOUNTER — Emergency Department (HOSPITAL_COMMUNITY): Payer: Self-pay

## 2017-12-19 ENCOUNTER — Encounter (HOSPITAL_COMMUNITY): Payer: Self-pay | Admitting: Emergency Medicine

## 2017-12-19 ENCOUNTER — Other Ambulatory Visit: Payer: Self-pay

## 2017-12-19 ENCOUNTER — Emergency Department (HOSPITAL_COMMUNITY)
Admission: EM | Admit: 2017-12-19 | Discharge: 2017-12-19 | Disposition: A | Discharge status: Home / Self Care | Payer: Self-pay | Attending: Emergency Medicine | Admitting: Emergency Medicine

## 2017-12-19 DIAGNOSIS — Z87891 Personal history of nicotine dependence: Secondary | ICD-10-CM | POA: Insufficient documentation

## 2017-12-19 DIAGNOSIS — N839 Noninflammatory disorder of ovary, fallopian tube and broad ligament, unspecified: Secondary | ICD-10-CM | POA: Insufficient documentation

## 2017-12-19 DIAGNOSIS — M545 Low back pain, unspecified: Secondary | ICD-10-CM

## 2017-12-19 DIAGNOSIS — Z79899 Other long term (current) drug therapy: Secondary | ICD-10-CM | POA: Insufficient documentation

## 2017-12-19 DIAGNOSIS — N838 Other noninflammatory disorders of ovary, fallopian tube and broad ligament: Secondary | ICD-10-CM

## 2017-12-19 LAB — COMPREHENSIVE METABOLIC PANEL
ALK PHOS: 81 U/L (ref 38–126)
ALT: 15 U/L (ref 0–44)
AST: 20 U/L (ref 15–41)
Albumin: 4 g/dL (ref 3.5–5.0)
Anion gap: 12 (ref 5–15)
BUN: 10 mg/dL (ref 8–23)
CALCIUM: 9.5 mg/dL (ref 8.9–10.3)
CHLORIDE: 104 mmol/L (ref 98–111)
CO2: 22 mmol/L (ref 22–32)
CREATININE: 0.74 mg/dL (ref 0.44–1.00)
GFR calc Af Amer: >60 mL/min (ref >60)
GFR calc non Af Amer: >60 mL/min (ref >60)
GLUCOSE: 187 mg/dL — AB (ref 70–99)
Potassium: 3.5 mmol/L (ref 3.5–5.1)
SODIUM: 138 mmol/L (ref 135–145)
Total Bilirubin: 0.6 mg/dL (ref 0.3–1.2)
Total Protein: 7.2 g/dL (ref 6.5–8.1)

## 2017-12-19 LAB — CBC
HCT: 43.2 % (ref 36.0–46.0)
Hemoglobin: 13.3 g/dL (ref 12.0–15.0)
MCH: 26 pg (ref 26.0–34.0)
MCHC: 30.8 g/dL (ref 30.0–36.0)
MCV: 84.5 fL (ref 78.0–100.0)
PLATELETS: 338 10*3/uL (ref 150–400)
RBC: 5.11 MIL/uL (ref 3.87–5.11)
RDW: 14.5 % (ref 11.5–15.5)
WBC: 11.6 10*3/uL — ABNORMAL HIGH (ref 4.0–10.5)

## 2017-12-19 LAB — URINALYSIS, ROUTINE W REFLEX MICROSCOPIC
Bilirubin Urine: NEGATIVE
Glucose, UA: NEGATIVE mg/dL
KETONES UR: 5 mg/dL — AB
Nitrite: NEGATIVE
PH: 6 (ref 5.0–8.0)
Protein, ur: NEGATIVE mg/dL
SPECIFIC GRAVITY, URINE: 1.015 (ref 1.005–1.030)

## 2017-12-19 LAB — LIPASE, BLOOD: LIPASE: 31 U/L (ref 11–51)

## 2017-12-19 MED ORDER — ONDANSETRON 4 MG PO TBDP
4.0000 mg | ORAL_TABLET | Freq: Once | ORAL | Status: AC | PRN
  Administered 2017-12-19: 4 mg via ORAL
  Filled 2017-12-19: qty 1

## 2017-12-19 MED ORDER — OXYCODONE-ACETAMINOPHEN 5-325 MG PO TABS: 1.0000 | ORAL_TABLET | Freq: Four times a day (QID) | ORAL | 0 refills | Status: DC | PRN

## 2017-12-19 MED ORDER — OXYCODONE-ACETAMINOPHEN 5-325 MG PO TABS
1.0000 | ORAL_TABLET | Freq: Once | ORAL | Status: AC
  Administered 2017-12-19: 1 via ORAL
  Filled 2017-12-19: qty 1

## 2017-12-19 MED ORDER — OXYCODONE-ACETAMINOPHEN 5-325 MG PO TABS
1.0000 | ORAL_TABLET | ORAL | Status: DC | PRN
  Administered 2017-12-19: 1 via ORAL
  Filled 2017-12-19: qty 1

## 2017-12-19 NOTE — Discharge Instructions (Signed)
Please read attached information. Please follow-up with OB/GYN oncologist as recommended. If you have any difficulty with following up please contact your primary care provider or the Hospital for assistance.

## 2017-12-19 NOTE — ED Provider Notes (Signed)
[facility] EMERGENCY DEPARTMENT Provider Note   CSN: [id number] Arrival date & time: 12/19/17  1647     History   Chief Complaint Chief Complaint  Patient presents with  . Flank Pain    HPI Erika [name] is a 62 y.o. female.  HPI   62 year old female presents today with complaints of flank pain. Patient notes symptoms started last night with a sharp stabbing pain in her left flank and lower back . She notes the pain radiates into her suprapubic region. She denies associated nausea, had a episode of severe pain with associated vomiting earlier today. She denies any abdominal pain, denies any fever. She notes she has not had a recent bowel movement, notes pain with urination, and additionally has had difficulty urinating today, none yesterday. No history of the same. No history of kidney stones.patient additionally notes that over the last 2 days she has been performing housework lifting boxes and organizing.    Past Medical History:  Diagnosis Date  . Anxiety   . Menopausal syndrome   . Neuroma of foot   . OSA on CPAP     Patient Active Problem List   Diagnosis Date Noted  . Routine general medical examination at a health care facility 06/14/2014  . Cough 05/04/2013  . Fatigue 01/14/2012  . Skin tag 08/23/2011  . Obstructive sleep apnea 11/30/2008  . ANXIETY 01/15/2008  . 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 BENIGN POSITIONAL VERTIGO 11/21/2006  . ALLERGIC RHINITIS 11/21/2006    Past Surgical History:  Procedure Laterality Date  . arm surgery     right elbow and arm  . FOOT SURGERY     neuroma surg on left foot     OB History   None      Home Medications    Prior to Admission medications   Medication Sig Start Date End Date Taking? Authorizing Provider  citalopram (CELEXA) 40 MG tablet TAKE 2 TABLETS BY MOUTH AT BEDTIME 10/22/17   Dorena Cookey, MD  fluocinonide (LIDEX) 0.05 % external solution Apply 1 application  topically 2 (two) times daily. 12/25/16   Dorena Cookey, MD  levothyroxine (SYNTHROID, LEVOTHROID) 25 MCG tablet TAKE 1 TABLET BY MOUTH EVERY DAY 10/26/16   Delano Metz, FNP  methocarbamol (ROBAXIN) 750 MG tablet Take 750 mg by mouth.    [provider]  nitrofurantoin, macrocrystal-monohydrate, (MACROBID) 100 MG capsule Take 1 capsule (100 mg total) by mouth 2 (two) times daily. 08/01/17   Lucretia Kern, DO  oxyCODONE-acetaminophen (PERCOCET/ROXICET) 5-325 MG tablet Take 1 tablet by mouth every 6 (six) hours as needed for severe pain. 12/19/17   Jerriah Ines, Dellis Filbert, PA-C  traMADol (ULTRAM) 50 MG tablet Take 50 mg by mouth every 6 (six) hours as needed.    [provider]    Family History Family History  Problem Relation Age of Onset  . Diabetes Mother   . Heart disease Mother   . Diabetes Maternal Grandmother   . Diabetes Maternal Grandfather   . Asthma Sister   . Cancer Paternal Grandmother   . Cancer Maternal Uncle   . Cancer Maternal Aunt   . Cancer Brother     Social History Social History   Tobacco Use  . Smoking status: Former Smoker    Packs/day: 1.00    Years: 30.00    Pack years: 30.00    Types: Cigarettes    Last attempt to quit: 06/01/2000    Years since quitting:  17.5  . Smokeless tobacco: Never Used  Substance Use Topics  . Alcohol use: No    Comment: rare  . Drug use: No     Allergies   Tetracycline hcl   Review of Systems Review of Systems  All other systems reviewed and are negative.    Physical Exam Updated Vital Signs BP 116/74 (BP Location: Right Arm)   Pulse 72   Temp 97.6 F (36.4 C) (Oral)   Resp 18   Ht 5\' 7"  (1.702 m)   Wt 104.3 kg   SpO2 100%   BMI 36.02 kg/m   Physical Exam  Constitutional: She is oriented to person, place, and time. She appears well-developed and well-nourished.  HENT:  Head: Normocephalic and atraumatic.  Eyes: Pupils are equal, round, and reactive to light. Conjunctivae are normal.  Right eye exhibits no discharge. Left eye exhibits no discharge. No scleral icterus.  Neck: Normal range of motion. No JVD present. No tracheal deviation present.  Pulmonary/Chest: Effort normal. No stridor.  Abdominal: Soft. She exhibits no distension and no mass. There is tenderness. There is no rebound and no guarding. No hernia.  Minor suprapubic tenderness to palpation, remainder abdomen soft nontender  Musculoskeletal:  Tenderness to palpation of left lateral lumbar musculature- no midline tenderness  Neurological: She is alert and oriented to person, place, and time. Coordination normal.  Psychiatric: She has a normal mood and affect. Her behavior is normal. Judgment and thought content normal.  Nursing note and vitals reviewed.    ED Treatments / Results  Labs (all labs ordered are listed, but only abnormal results are displayed) Labs Reviewed  COMPREHENSIVE METABOLIC PANEL - Abnormal; Notable for the following components:      Result Value   Glucose, Bld 187 (*)    All other components within normal limits  CBC - Abnormal; Notable for the following components:   WBC 11.6 (*)    All other components within normal limits  URINALYSIS, ROUTINE W REFLEX MICROSCOPIC - Abnormal; Notable for the following components:   APPearance HAZY (*)    Hgb urine dipstick SMALL (*)    Ketones, ur 5 (*)    Leukocytes, UA SMALL (*)    Bacteria, UA RARE (*)    All other components within normal limits  URINE CULTURE  LIPASE, BLOOD    EKG None  Radiology Ct Renal Stone Study  Result Date: 12/19/2017 CLINICAL DATA:  62 year old female with back and left flank pain EXAM: CT ABDOMEN AND PELVIS WITHOUT CONTRAST TECHNIQUE: Multidetector CT imaging of the abdomen and pelvis was performed following the standard protocol without IV contrast. COMPARISON:  None. FINDINGS: Lower chest: The lung bases are clear. Visualized cardiac structures are within normal limits for size. Calcifications present  along the coronary arteries. No pericardial effusion. Unremarkable visualized distal thoracic esophagus. Hepatobiliary: Normal hepatic contour and morphology. No discrete hepatic lesions. Normal appearance of the gallbladder. No intra or extrahepatic biliary ductal dilatation. Pancreas: Unremarkable. No pancreatic ductal dilatation or surrounding inflammatory changes. Spleen: Normal in size without focal abnormality. Adrenals/Urinary Tract: Adrenal glands are unremarkable. Kidneys are normal, without renal calculi, focal lesion, or hydronephrosis. Bladder is unremarkable. Stomach/Bowel: No evidence of obstruction or focal bowel wall thickening. Normal appendix in the right lower quadrant. The terminal ileum is unremarkable. Vascular/Lymphatic: Limited evaluation in the absence of intravenous contrast. Atherosclerotic calcifications along the abdominal aorta. No evidence of aneurysm. No suspicious lymphadenopathy. Reproductive: Dystrophic calcifications in the uterine fundus likely represent degenerated fibroids. Abnormal appearance of  both ovaries. A complex mixed cystic and solid mass in the region of the right ovary measures approximately 6.9 x 6.6 x 6.6 cm. A smaller complex cystic mass is affiliated with the left ovary measuring approximately 6.1 by 3.9 x 4.6 cm. Other: No abdominal wall hernia or abnormality. No abdominopelvic ascites. Musculoskeletal: No acute or significant osseous findings. IMPRESSION: 1. Abnormal appearance of both ovaries with bilateral mixed cystic and solid ovarian masses. On the right, a complex cystic mass measures 6.9 x 6.6 x 6.6 cm (volume = 160 cm^3). On the left, the complex mass measures 6.1 x 3.9 x 4.6 cm (volume = 57 cm^3). Differential considerations include both benign and malignant cystic ovarian neoplasms. Recommend referral to GYN-oncology. Pelvic MRI with gadolinium contrast would likely be superior to transvaginal ultrasound for further evaluation given the size of the  right adnexal mass. 2. Coronary artery calcifications. 3.  Aortic Atherosclerosis (ICD10-170.0) Electronically Signed   By: Jacqulynn Cadet M.D.   On: 12/19/2017 20:20    Procedures Procedures (including critical care time)  Medications Ordered in ED Medications  oxyCODONE-acetaminophen (PERCOCET/ROXICET) 5-325 MG per tablet 1 tablet (1 tablet Oral Given 12/19/17 1733)  ondansetron (ZOFRAN-ODT) disintegrating tablet 4 mg (4 mg Oral Given 12/19/17 1733)  oxyCODONE-acetaminophen (PERCOCET/ROXICET) 5-325 MG per tablet 1 tablet (1 tablet Oral Given 12/19/17 2104)     Initial Impression / Assessment and Plan / ED Course  I have reviewed the triage vital signs and the nursing notes.  Pertinent labs & imaging results that were available during my care of the patient were reviewed by me and considered in my medical decision making (see chart for details).     Labs: lipase, CMP, CBC   Imaging:  Consults:  Therapeutics: Percocet   Discharge Meds: Percocet  Assessment/Plan: 62 year old female presents today with complaints of back pain. I have the patient for musculoskeletal back pain. Patient does have additional symptoms including suprapubic pain with urination. Upon reassessment she has no tenderness in the suprapubic region, she was able to urinate here without significant difficulty. Her labs are reassuring showing the signs of infection. Her CT did show ovarian masses with question of benign versus malignant. I discussed all findings with the patient, patient will need close outpatient follow-up with GYN oncology. She was given the contact information for them, she will contact them in the morning him if she is unable to arrange outpatient follow-up she will return to the emergency room, return if she develops any new or worsening signs or symptoms. Patient verbalized understanding and agreement to today's plan had no further questions concerns at the time discharge.   Final Clinical  Impressions(s) / ED Diagnoses   Final diagnoses:  Acute left-sided low back pain without sciatica  Ovarian mass    ED Discharge Orders         Ordered    oxyCODONE-acetaminophen (PERCOCET/ROXICET) 5-325 MG tablet  Every 6 hours PRN     12/19/17 2158           Okey Regal, PA-C 12/19/17 2215    Maudie Flakes, MD 12/19/17 [phone]

## 2017-12-19 NOTE — ED Triage Notes (Signed)
Pt states she started having back pain on left lower back that radiates around to her left side and stomach. Pt states he took her home meds for pain with no relief. States she isn't able to pee due to pain in groin. States she's been nauseated with headache. 8/10 pain abdomen

## 2017-12-19 NOTE — ED Notes (Signed)
Patient verbalizes understanding of discharge instructions. Opportunity for questioning and answers were provided. Armband removed by staff, pt discharged from ED ambulatory.   

## 2017-12-20 ENCOUNTER — Telehealth: Payer: Self-pay

## 2017-12-20 NOTE — Telephone Encounter (Signed)
Incoming call from patient requesting appt with Dr Denman George- pt reports she was recently seen in ER and verbalized that due to large cysts on ovaries, was recommended to f/u with Dr Denman George.  I let patient know I would let provider review recent ER notes and CT results and then call her back.  Pt voiced understanding and no other needs per pt at this time.

## 2017-12-20 NOTE — Telephone Encounter (Signed)
Joylene John NP reviewed CT results and ER notes and gave appt for [facility] for 9-10 at 1:45 pm.  Pt agreeable but reported financial concern as she is "retired and has no insurance."  Told her that she can meet with a Development worker, community here and gave her their number at [phone].  Directions given and to come 20 min early to register. No other needs per pt at this time.

## 2017-12-21 LAB — URINE CULTURE: Culture: <10000 — AB

## 2017-12-24 ENCOUNTER — Inpatient Hospital Stay: Payer: Self-pay | Attending: Gynecology | Admitting: Gynecology

## 2017-12-24 ENCOUNTER — Other Ambulatory Visit (HOSPITAL_COMMUNITY)
Admission: RE | Admit: 2017-12-24 | Discharge: 2017-12-24 | Disposition: A | Discharge status: Home / Self Care | Payer: Self-pay | Source: Ambulatory Visit | Attending: Gynecology | Admitting: Gynecology

## 2017-12-24 ENCOUNTER — Inpatient Hospital Stay: Payer: Self-pay

## 2017-12-24 ENCOUNTER — Encounter: Payer: Self-pay | Admitting: Gynecology

## 2017-12-24 ENCOUNTER — Ambulatory Visit: Payer: Self-pay | Admitting: Gynecology

## 2017-12-24 VITALS — BP 170/86 | HR 72 | Temp 98.3°F | Resp 18 | Ht 67.0 in | Wt 234.0 lb

## 2017-12-24 DIAGNOSIS — Z8742 Personal history of other diseases of the female genital tract: Secondary | ICD-10-CM

## 2017-12-24 DIAGNOSIS — D3911 Neoplasm of uncertain behavior of right ovary: Secondary | ICD-10-CM | POA: Insufficient documentation

## 2017-12-24 DIAGNOSIS — D3912 Neoplasm of uncertain behavior of left ovary: Secondary | ICD-10-CM | POA: Insufficient documentation

## 2017-12-24 DIAGNOSIS — N838 Other noninflammatory disorders of ovary, fallopian tube and broad ligament: Secondary | ICD-10-CM

## 2017-12-24 DIAGNOSIS — N839 Noninflammatory disorder of ovary, fallopian tube and broad ligament, unspecified: Secondary | ICD-10-CM | POA: Insufficient documentation

## 2017-12-24 DIAGNOSIS — Z87898 Personal history of other specified conditions: Secondary | ICD-10-CM | POA: Insufficient documentation

## 2017-12-24 NOTE — Progress Notes (Signed)
Assessment : Bilateral complex ovarian masses.  Plan: I reviewed the patient's CT scan findings with her.  Indicated that these ovarian masses may be either benign or malignant but in order to be certain surgical resection is necessary.  I would like to get a Ca1 25 preoperatively.  If it is normal we will proceed with laparoscopic bilateral salpingo-oophorectomy.  On the other hand if it is elevated would proceed directly to open laparotomy surgical resection and staging.  Risks of surgery including hemorrhage, infection, injury to adjacent viscera, anesthetic risk, and thromboembolic complications were reviewed.  Patient's questions were answered.  We will schedule the patient to undergo surgery with Dr. Everitt Amber on September 17.  Pap smears obtained today.  HPI: 62 year old white married female gravida 2 para 2 referred from the [place] emergency room for further evaluation of bilateral ovarian masses.  The patient presented to the emergency room on December 19, 2017 with back pain radiating to the lower pelvis.  CT scan showed bilateral pelvic masses measuring 6.9 x 6.6 x 6.6 cm entheses right ovary) and on the left a 6.1 x 3.9 x 4.6 cm cystic mass.  There is solid and cystic components to the mass.  Tumor markers are not available at this time.  There is no other evidence of malignancy including no evidence of adenopathy, hydronephrosis, or ascites.  Patient admits to having some suprapubic pressure and intermittent pain as well as some bloating.  She has a remote history of abnormal Pap smears which resolved spontaneously.  Her last Pap smear in 2014 was normal.  Otherwise she has no past gynecologic history.   Patient does have obstructive sleep apnea and uses CPAP at night.  She does not use oxygen during the day.  Otherwise her functional status is good.  Review of Systems:10 point review of systems is negative except as noted in interval history.   Vitals: Blood pressure (!)  170/86, pulse 72, temperature 98.3 F (36.8 C), temperature source Oral, resp. rate 18, height 5\' 7"  (1.702 m), weight 234 lb (106.1 kg), SpO2 100 %.  Physical Exam: General : The patient is a healthy woman in no acute distress.  HEENT: normocephalic, extraoccular movements normal; neck is supple without thyromegally  Lynphnodes: Supraclavicular and inguinal nodes not enlarged  Abdomen: Obese, soft, non-tender, no ascites, no organomegally, no masses, no hernias  Pelvic:  EGBUS: Normal female  Vagina: Normal, no lesions  Urethra and Bladder: Normal, non-tender  Cervix: Normal, no lesions are noted. Uterus: Difficult to outline secondary to the patient's habitus.    Bi-manual examination: Non-tender; I am unable to appreciate the masses.  There is no nodularity. Rectal: normal sphincter tone, no masses, no blood  Lower extremities: No edema or varicosities. Normal range of motion      Allergies  Allergen Reactions  . Tetracycline Hcl     rash    Past Medical History:  Diagnosis Date  . Anxiety   . Menopausal syndrome   . Neuroma of foot   . OSA on CPAP     Past Surgical History:  Procedure Laterality Date  . arm surgery     right elbow and arm  . FOOT SURGERY     neuroma surg on left foot    Current Outpatient Medications  Medication Sig Dispense Refill  . citalopram (CELEXA) 40 MG tablet TAKE 2 TABLETS BY MOUTH AT BEDTIME 180 tablet 0  . traMADol (ULTRAM) 50 MG tablet Take 50 mg by mouth every 6 (  six) hours as needed.    . fluocinonide (LIDEX) 0.05 % external solution Apply 1 application topically 2 (two) times daily. (Patient not taking: Reported on 12/24/2017) 60 mL 0  . methocarbamol (ROBAXIN) 750 MG tablet Take 750 mg by mouth.    . oxyCODONE-acetaminophen (PERCOCET/ROXICET) 5-325 MG tablet Take 1 tablet by mouth every 6 (six) hours as needed for severe pain. (Patient not taking: Reported on 12/24/2017) 6 tablet 0   No current facility-administered medications for  this visit.     Social History   Socioeconomic History  . Marital status: Married    Spouse name: Not on file  . Number of children: Not on file  . Years of education: Not on file  . Highest education level: Not on file  Occupational History  . Occupation: Solicitor: [employer]  Social Needs  . Financial resource strain: Not on file  . Food insecurity:    Worry: Not on file    Inability: Not on file  . Transportation needs:    Medical: Not on file    Non-medical: Not on file  Tobacco Use  . Smoking status: Former Smoker    Packs/day: 1.00    Years: 30.00    Pack years: 30.00    Types: Cigarettes    Last attempt to quit: 06/01/2000    Years since quitting: 17.5  . Smokeless tobacco: Never Used  Substance and Sexual Activity  . Alcohol use: No    Comment: rare  . Drug use: No  . Sexual activity: Not Currently  Lifestyle  . Physical activity:    Days per week: Not on file    Minutes per session: Not on file  . Stress: Not on file  Relationships  . Social connections:    Talks on phone: Not on file    Gets together: Not on file    Attends religious service: Not on file    Active member of club or organization: Not on file    Attends meetings of clubs or organizations: Not on file    Relationship status: Not on file  . Intimate partner violence:    Fear of current or ex partner: Not on file    Emotionally abused: Not on file    Physically abused: Not on file    Forced sexual activity: Not on file  Other Topics Concern  . Not on file  Social History Narrative  . Not on file    Family History  Problem Relation Age of Onset  . Diabetes Mother   . Heart disease Mother   . Diabetes Maternal Grandmother   . Diabetes Maternal Grandfather   . Asthma Sister   . Cancer Paternal Grandmother   . Cancer Maternal Uncle   . Uterine cancer Maternal Uncle   . Cancer Maternal Aunt   . Cancer Brother       Marti Sleigh,  MD 12/24/2017, 10:22 AM

## 2017-12-24 NOTE — Patient Instructions (Signed)
We will obtain a CA 125 today and contact you with the results.                Preparing for your Surgery  Plan for surgery on December 31, 2017 with Dr. Everitt Amber at [facility] will be scheduled for a robotic assisted bilateral salpingo-oophorectomy, possible laparotomy, possible staging.  Pre-operative Testing -You will receive a phone call from presurgical testing at [facility] to arrange for a pre-operative testing appointment before your surgery.  This appointment normally occurs one to two weeks before your scheduled surgery.   -Bring your insurance card, copy of an advanced directive if applicable, medication list  -At that visit, you will be asked to sign a consent for a possible blood transfusion in case a transfusion becomes necessary during surgery.  The need for a blood transfusion is rare but having consent is a necessary part of your care.     -You should not be taking blood thinners or aspirin at least ten days prior to surgery unless instructed by your surgeon.  Day Before Surgery at [facility] will be asked to take in a light diet the day before surgery.  Avoid carbonated beverages.  You will be advised to have nothing to eat or drink after midnight the evening before.    Eat a light diet the day before surgery.  Examples including soups, broths, toast, yogurt, mashed potatoes.  Things to avoid include carbonated beverages  (fizzy beverages), raw fruits and raw vegetables, or beans.   If your bowels are filled with gas, your surgeon will have difficulty visualizing your pelvic organs which increases your surgical risks.  Your role in recovery Your role is to become active as soon as directed by your doctor, while still giving yourself time to heal.  Rest when you feel tired. You will be asked to do the following in order to speed your recovery:  - Cough and breathe deeply. This helps toclear and expand your lungs and can prevent pneumonia. You may  be given a spirometer to practice deep breathing. A staff member will show you how to use the spirometer. - Do mild physical activity. Walking or moving your legs help your circulation and body functions return to normal. A staff member will help you when you try to walk and will provide you with simple exercises. Do not try to get up or walk alone the first time. - Actively manage your pain. Managing your pain lets you move in comfort. We will ask you to rate your pain on a scale of zero to 10. It is your responsibility to tell your doctor or nurse where and how much you hurt so your pain can be treated.  Special Considerations -If you are diabetic, you may be placed on insulin after surgery to have closer control over your blood sugars to promote healing and recovery.  This does not mean that you will be discharged on insulin.  If applicable, your oral antidiabetics will be resumed when you are tolerating a solid diet.  -Your final pathology results from surgery should be available by the Friday after surgery and the results will be relayed to you when available.  -Dr. Lahoma Crocker is the Surgeon that assists your GYN Oncologist with surgery.  The next day after your surgery you will either see your GYN Oncologist or Dr. Lahoma Crocker.   Blood Transfusion Information WHAT IS A BLOOD TRANSFUSION? A transfusion is the replacement of blood  or some of its parts. Blood is made up of multiple cells which provide different functions.  Red blood cells carry oxygen and are used for blood loss replacement.  White blood cells fight against infection.  Platelets control bleeding.  Plasma helps clot blood.  Other blood products are available for specialized needs, such as hemophilia or other clotting disorders. BEFORE THE TRANSFUSION  Who gives blood for transfusions?   You may be able to donate blood to be used at a later date on yourself (autologous donation).  Relatives can be asked  to donate blood. This is generally not any safer than if you have received blood from a stranger. The same precautions are taken to ensure safety when a relative's blood is donated.  Healthy volunteers who are fully evaluated to make sure their blood is safe. This is blood bank blood. Transfusion therapy is the safest it has ever been in the practice of medicine. Before blood is taken from a donor, a complete history is taken to make sure that person has no history of diseases nor engages in risky social behavior (examples are intravenous drug use or sexual activity with multiple partners). The donor's travel history is screened to minimize risk of transmitting infections, such as malaria. The donated blood is tested for signs of infectious diseases, such as HIV and hepatitis. The blood is then tested to be sure it is compatible with you in order to minimize the chance of a transfusion reaction. If you or a relative donates blood, this is often done in anticipation of surgery and is not appropriate for emergency situations. It takes many days to process the donated blood. RISKS AND COMPLICATIONS Although transfusion therapy is very safe and saves many lives, the main dangers of transfusion include:   Getting an infectious disease.  Developing a transfusion reaction. This is an allergic reaction to something in the blood you were given. Every precaution is taken to prevent this. The decision to have a blood transfusion has been considered carefully by your caregiver before blood is given. Blood is not given unless the benefits outweigh the risks.

## 2017-12-24 NOTE — H&P (View-Only) (Signed)
Assessment : Bilateral complex ovarian masses.  Plan: I reviewed the patient's CT scan findings with her.  Indicated that these ovarian masses may be either benign or malignant but in order to be certain surgical resection is necessary.  I would like to get a Ca1 25 preoperatively.  If it is normal we will proceed with laparoscopic bilateral salpingo-oophorectomy.  On the other hand if it is elevated would proceed directly to open laparotomy surgical resection and staging.  Risks of surgery including hemorrhage, infection, injury to adjacent viscera, anesthetic risk, and thromboembolic complications were reviewed.  Patient's questions were answered.  We will schedule the patient to undergo surgery with Dr. Everitt Amber on September 17.  Pap smears obtained today.  HPI: 62 year old white married female gravida 2 para 2 referred from the [place] emergency room for further evaluation of bilateral ovarian masses.  The patient presented to the emergency room on December 19, 2017 with back pain radiating to the lower pelvis.  CT scan showed bilateral pelvic masses measuring 6.9 x 6.6 x 6.6 cm entheses right ovary) and on the left a 6.1 x 3.9 x 4.6 cm cystic mass.  There is solid and cystic components to the mass.  Tumor markers are not available at this time.  There is no other evidence of malignancy including no evidence of adenopathy, hydronephrosis, or ascites.  Patient admits to having some suprapubic pressure and intermittent pain as well as some bloating.  She has a remote history of abnormal Pap smears which resolved spontaneously.  Her last Pap smear in 2014 was normal.  Otherwise she has no past gynecologic history.   Patient does have obstructive sleep apnea and uses CPAP at night.  She does not use oxygen during the day.  Otherwise her functional status is good.  Review of Systems:10 point review of systems is negative except as noted in interval history.   Vitals: Blood pressure (!)  170/86, pulse 72, temperature 98.3 F (36.8 C), temperature source Oral, resp. rate 18, height 5\' 7"  (1.702 m), weight 234 lb (106.1 kg), SpO2 100 %.  Physical Exam: General : The patient is a healthy woman in no acute distress.  HEENT: normocephalic, extraoccular movements normal; neck is supple without thyromegally  Lynphnodes: Supraclavicular and inguinal nodes not enlarged  Abdomen: Obese, soft, non-tender, no ascites, no organomegally, no masses, no hernias  Pelvic:  EGBUS: Normal female  Vagina: Normal, no lesions  Urethra and Bladder: Normal, non-tender  Cervix: Normal, no lesions are noted. Uterus: Difficult to outline secondary to the patient's habitus.    Bi-manual examination: Non-tender; I am unable to appreciate the masses.  There is no nodularity. Rectal: normal sphincter tone, no masses, no blood  Lower extremities: No edema or varicosities. Normal range of motion      Allergies  Allergen Reactions  . Tetracycline Hcl     rash    Past Medical History:  Diagnosis Date  . Anxiety   . Menopausal syndrome   . Neuroma of foot   . OSA on CPAP     Past Surgical History:  Procedure Laterality Date  . arm surgery     right elbow and arm  . FOOT SURGERY     neuroma surg on left foot    Current Outpatient Medications  Medication Sig Dispense Refill  . citalopram (CELEXA) 40 MG tablet TAKE 2 TABLETS BY MOUTH AT BEDTIME 180 tablet 0  . traMADol (ULTRAM) 50 MG tablet Take 50 mg by mouth every 6 (  six) hours as needed.    . fluocinonide (LIDEX) 0.05 % external solution Apply 1 application topically 2 (two) times daily. (Patient not taking: Reported on 12/24/2017) 60 mL 0  . methocarbamol (ROBAXIN) 750 MG tablet Take 750 mg by mouth.    . oxyCODONE-acetaminophen (PERCOCET/ROXICET) 5-325 MG tablet Take 1 tablet by mouth every 6 (six) hours as needed for severe pain. (Patient not taking: Reported on 12/24/2017) 6 tablet 0   No current facility-administered medications for  this visit.     Social History   Socioeconomic History  . Marital status: Married    Spouse name: Not on file  . Number of children: Not on file  . Years of education: Not on file  . Highest education level: Not on file  Occupational History  . Occupation: Solicitor: [employer]  Social Needs  . Financial resource strain: Not on file  . Food insecurity:    Worry: Not on file    Inability: Not on file  . Transportation needs:    Medical: Not on file    Non-medical: Not on file  Tobacco Use  . Smoking status: Former Smoker    Packs/day: 1.00    Years: 30.00    Pack years: 30.00    Types: Cigarettes    Last attempt to quit: 06/01/2000    Years since quitting: 17.5  . Smokeless tobacco: Never Used  Substance and Sexual Activity  . Alcohol use: No    Comment: rare  . Drug use: No  . Sexual activity: Not Currently  Lifestyle  . Physical activity:    Days per week: Not on file    Minutes per session: Not on file  . Stress: Not on file  Relationships  . Social connections:    Talks on phone: Not on file    Gets together: Not on file    Attends religious service: Not on file    Active member of club or organization: Not on file    Attends meetings of clubs or organizations: Not on file    Relationship status: Not on file  . Intimate partner violence:    Fear of current or ex partner: Not on file    Emotionally abused: Not on file    Physically abused: Not on file    Forced sexual activity: Not on file  Other Topics Concern  . Not on file  Social History Narrative  . Not on file    Family History  Problem Relation Age of Onset  . Diabetes Mother   . Heart disease Mother   . Diabetes Maternal Grandmother   . Diabetes Maternal Grandfather   . Asthma Sister   . Cancer Paternal Grandmother   . Cancer Maternal Uncle   . Uterine cancer Maternal Uncle   . Cancer Maternal Aunt   . Cancer Brother       Marti Sleigh,  MD 12/24/2017, 10:22 AM

## 2017-12-25 ENCOUNTER — Telehealth: Payer: Self-pay

## 2017-12-25 LAB — CA 125: CANCER ANTIGEN (CA) 125: 28.5 U/mL (ref 0.0–38.1)

## 2017-12-25 NOTE — Telephone Encounter (Signed)
Outgoing call to patient - Per Erika John NP, CA 125 results are "normal" - pt voiced understanding. No other needs per pt at this time.

## 2017-12-25 NOTE — Patient Instructions (Addendum)
[name]  12/25/2017   Your procedure is scheduled on: 12/31/2017   Report to [name] Main  Entrance  Report to admitting at   05:30 AM    Call this number if you have problems the morning of surgery [phone]   Remember: Do not eat food  After Midnight. Eat a light diet the day before surgery.  Examples include soups , toast, yogurt and mashed potatoes and broths.  Things to avoid include: carbonated beverages, raw fruits and vegetables and beans.                 NO SOLID FOOD AFTER MIDNIGHT THE NIGHT PRIOR TO SURGERY. NOTHING BY MOUTH EXCEPT CLEAR LIQUIDS UNTIL 3 HOURS PRIOR TO [name] SURGERY. PLEASE FINISH ENSURE DRINK PER SURGEON ORDER 3 HOURS PRIOR TO SCHEDULED SURGERY TIME WHICH NEEDS TO BE COMPLETED AT ____04:30am ________.                 CLEAR LIQUID DIET   Foods Allowed                                                                     Foods Excluded  Coffee and tea, regular and decaf                             liquids that you cannot  Plain Jell-O in any flavor                                             see through such as: Fruit ices (not with fruit pulp)                                     milk, soups, orange juice  Iced Popsicles                                    All solid food                                    Cranberry, grape and apple juices Sports drinks like Gatorade Lightly seasoned clear broth or consume(fat free) Sugar, honey syrup  Sample Menu Breakfast                                Lunch                                     Supper Cranberry juice                    Beef broth  Chicken broth Jell-O                                     Grape juice                           Apple juice Coffee or tea                        Jell-O                                      Popsicle                                                Coffee or tea                        Coffee or  tea  _____________________________________________________________________       Take these medicines the morning of surgery with A SIP OF WATER: none                                 You may not have any metal on your body including hair pins and              piercings  Do not wear jewelry, make-up, lotions, powders or perfumes, deodorant             Do not wear nail polish.  Do not shave  48 hours prior to surgery.                 Do not bring valuables to the hospital. [place].  Contacts, dentures or bridgework may not be worn into surgery.       Patients discharged the day of surgery will not be allowed to drive home.  Name and phone number of your driver:   husband                Please read over the following fact sheets you were given: _____________________________________________________________________             [name] - Preparing for Surgery Before surgery, you can play an important role.  Because skin is not sterile, your skin needs to be as free of germs as possible.  You can reduce the number of germs on your skin by washing with CHG (chlorahexidine gluconate) soap before surgery.  CHG is an antiseptic cleaner which kills germs and bonds with the skin to continue killing germs even after washing. Please DO NOT use if you have an allergy to CHG or antibacterial soaps.  If your skin becomes reddened/irritated stop using the CHG and inform your nurse when you arrive at Short Stay. Do not shave (including legs and underarms) for at least 48 hours prior to the first CHG shower.  You may shave your face/neck. Please follow these instructions carefully:  1.  Shower with CHG Soap the night before surgery and the  morning of Surgery.  2.  If you choose to wash your hair, wash your hair first as usual with your  normal  shampoo.  3.  After you shampoo, rinse your hair and body thoroughly to remove the  shampoo.                                        4.  Use CHG as you would any other liquid soap.  You can apply chg directly  to the skin and wash                       Gently with a scrungie or clean washcloth.  5.  Apply the CHG Soap to your body ONLY FROM THE NECK DOWN.   Do not use on face/ open                           Wound or open sores. Avoid contact with eyes, ears mouth and genitals (private parts).                       Wash face,  Genitals (private parts) with your normal soap.             6.  Wash thoroughly, paying special attention to the area where your surgery  will be performed.  7.  Thoroughly rinse your body with warm water from the neck down.  8.  DO NOT shower/wash with your normal soap after using and rinsing off  the CHG Soap.             9.  Pat yourself dry with a clean towel.            10.  Wear clean pajamas.            11.  Place clean sheets on your bed the night of your first shower and do not  sleep with pets. Day of Surgery : Do not apply any lotions/deodorants the morning of surgery.  Please wear clean clothes to the hospital/surgery center.  FAILURE TO FOLLOW THESE INSTRUCTIONS MAY RESULT IN THE CANCELLATION OF YOUR SURGERY PATIENT SIGNATURE_________________________________  NURSE SIGNATURE__________________________________  ________________________________________________________________________  WHAT IS A BLOOD TRANSFUSION? Blood Transfusion Information  A transfusion is the replacement of blood or some of its parts. Blood is made up of multiple cells which provide different functions.  Red blood cells carry oxygen and are used for blood loss replacement.  White blood cells fight against infection.  Platelets control bleeding.  Plasma helps clot blood.  Other blood products are available for specialized needs, such as hemophilia or other clotting disorders. BEFORE THE TRANSFUSION  Who gives blood for transfusions?   Healthy volunteers who are fully evaluated  to make sure their blood is safe. This is blood bank blood. Transfusion therapy is the safest it has ever been in the practice of medicine. Before blood is taken from a donor, a complete history is taken to make sure that person has no history of diseases nor engages in risky social behavior (examples are intravenous drug use or sexual activity with multiple partners). The donor's travel history is screened to minimize risk of transmitting infections, such as malaria. The donated blood is tested for signs of infectious diseases, such as HIV and hepatitis. The blood is then tested to be  sure it is compatible with you in order to minimize the chance of a transfusion reaction. If you or a relative donates blood, this is often done in anticipation of surgery and is not appropriate for emergency situations. It takes many days to process the donated blood. RISKS AND COMPLICATIONS Although transfusion therapy is very safe and saves many lives, the main dangers of transfusion include:   Getting an infectious disease.  Developing a transfusion reaction. This is an allergic reaction to something in the blood you were given. Every precaution is taken to prevent this. The decision to have a blood transfusion has been considered carefully by your caregiver before blood is given. Blood is not given unless the benefits outweigh the risks. AFTER THE TRANSFUSION  Right after receiving a blood transfusion, you will usually feel much better and more energetic. This is especially true if your red blood cells have gotten low (anemic). The transfusion raises the level of the red blood cells which carry oxygen, and this usually causes an energy increase.  The nurse administering the transfusion will monitor you carefully for complications. HOME CARE INSTRUCTIONS  No special instructions are needed after a transfusion. You may find your energy is better. Speak with your caregiver about any limitations on activity for  underlying diseases you may have. SEEK MEDICAL CARE IF:   Your condition is not improving after your transfusion.  You develop redness or irritation at the intravenous (IV) site. SEEK IMMEDIATE MEDICAL CARE IF:  Any of the following symptoms occur over the next 12 hours:  Shaking chills.  You have a temperature by mouth above 102 F (38.9 C), not controlled by medicine.  Chest, back, or muscle pain.  People around you feel you are not acting correctly or are confused.  Shortness of breath or difficulty breathing.  Dizziness and fainting.  You get a rash or develop hives.  You have a decrease in urine output.  Your urine turns a dark color or changes to pink, red, or brown. Any of the following symptoms occur over the next 10 days:  You have a temperature by mouth above 102 F (38.9 C), not controlled by medicine.  Shortness of breath.  Weakness after normal activity.  The white part of the eye turns yellow (jaundice).  You have a decrease in the amount of urine or are urinating less often.  Your urine turns a dark color or changes to pink, red, or brown. Document Released: 03/30/2000 Document Revised: 06/25/2011 Document Reviewed: 11/17/2007 ExitCare Patient Information 2014 [place].  _______________________________________________________________________  Incentive Spirometer  An incentive spirometer is a tool that can help keep your lungs clear and active. This tool measures how well you are filling your lungs with each breath. Taking long deep breaths may help reverse or decrease the chance of developing breathing (pulmonary) problems (especially infection) following:  A long period of time when you are unable to move or be active. BEFORE THE PROCEDURE   If the spirometer includes an indicator to show your best effort, your nurse or respiratory therapist will set it to a desired goal.  If possible, sit up straight or lean slightly forward. Try not to  slouch.  Hold the incentive spirometer in an upright position. INSTRUCTIONS FOR USE  1. Sit on the edge of your bed if possible, or sit up as far as you can in bed or on a chair. 2. Hold the incentive spirometer in an upright position. 3. Breathe out normally. 4. Place the mouthpiece  in your mouth and seal your lips tightly around it. 5. Breathe in slowly and as deeply as possible, raising the piston or the ball toward the top of the column. 6. Hold your breath for 3-5 seconds or for as long as possible. Allow the piston or ball to fall to the bottom of the column. 7. Remove the mouthpiece from your mouth and breathe out normally. 8. Rest for a few seconds and repeat Steps 1 through 7 at least 10 times every 1-2 hours when you are awake. Take your time and take a few normal breaths between deep breaths. 9. The spirometer may include an indicator to show your best effort. Use the indicator as a goal to work toward during each repetition. 10. After each set of 10 deep breaths, practice coughing to be sure your lungs are clear. If you have an incision (the cut made at the time of surgery), support your incision when coughing by placing a pillow or rolled up towels firmly against it. Once you are able to get out of bed, walk around indoors and cough well. You may stop using the incentive spirometer when instructed by your caregiver.  RISKS AND COMPLICATIONS  Take your time so you do not get dizzy or light-headed.  If you are in pain, you may need to take or ask for pain medication before doing incentive spirometry. It is harder to take a deep breath if you are having pain. AFTER USE  Rest and breathe slowly and easily.  It can be helpful to keep track of a log of your progress. Your caregiver can provide you with a simple table to help with this. If you are using the spirometer at home, follow these instructions: [place] IF:   You are having difficultly using the spirometer.  You  have trouble using the spirometer as often as instructed.  Your pain medication is not giving enough relief while using the spirometer.  You develop fever of 100.5 F (38.1 C) or higher. SEEK IMMEDIATE MEDICAL CARE IF:   You cough up bloody sputum that had not been present before.  You develop fever of 102 F (38.9 C) or greater.  You develop worsening pain at or near the incision site. MAKE SURE YOU:   Understand these instructions.  Will watch your condition.  Will get help right away if you are not doing well or get worse. Document Released: 08/13/2006 Document Revised: 06/25/2011 Document Reviewed: 10/14/2006 [facility] Patient Information 2014 [place], [place].   ________________________________________________________________________

## 2017-12-26 ENCOUNTER — Encounter (HOSPITAL_COMMUNITY): Payer: Self-pay

## 2017-12-26 ENCOUNTER — Other Ambulatory Visit: Payer: Self-pay

## 2017-12-26 ENCOUNTER — Encounter (HOSPITAL_COMMUNITY)
Admission: RE | Admit: 2017-12-26 | Discharge: 2017-12-26 | Disposition: A | Discharge status: Home / Self Care | Payer: Self-pay | Source: Ambulatory Visit | Attending: Gynecologic Oncology | Admitting: Gynecologic Oncology

## 2017-12-26 DIAGNOSIS — Z01812 Encounter for preprocedural laboratory examination: Secondary | ICD-10-CM | POA: Insufficient documentation

## 2017-12-26 HISTORY — DX: Unspecified osteoarthritis, unspecified site: M19.90

## 2017-12-26 HISTORY — DX: Personal history of other specified conditions: Z87.898

## 2017-12-26 HISTORY — DX: Unspecified asthma, uncomplicated: J45.909

## 2017-12-26 HISTORY — DX: Presence of spectacles and contact lenses: Z97.3

## 2017-12-26 LAB — COMPREHENSIVE METABOLIC PANEL
ALK PHOS: 75 U/L (ref 38–126)
ALT: 17 U/L (ref 0–44)
AST: 20 U/L (ref 15–41)
Albumin: 4 g/dL (ref 3.5–5.0)
Anion gap: 11 (ref 5–15)
BUN: 10 mg/dL (ref 8–23)
CALCIUM: 9.2 mg/dL (ref 8.9–10.3)
CO2: 26 mmol/L (ref 22–32)
CREATININE: 0.64 mg/dL (ref 0.44–1.00)
Chloride: 104 mmol/L (ref 98–111)
Glucose, Bld: 92 mg/dL (ref 70–99)
Potassium: 3.9 mmol/L (ref 3.5–5.1)
Sodium: 141 mmol/L (ref 135–145)
Total Bilirubin: 0.5 mg/dL (ref 0.3–1.2)
Total Protein: 7.4 g/dL (ref 6.5–8.1)

## 2017-12-26 LAB — CBC
HEMATOCRIT: 41.8 % (ref 36.0–46.0)
HEMOGLOBIN: 13.2 g/dL (ref 12.0–15.0)
MCH: 27 pg (ref 26.0–34.0)
MCHC: 31.6 g/dL (ref 30.0–36.0)
MCV: 85.5 fL (ref 78.0–100.0)
PLATELETS: 300 10*3/uL (ref 150–400)
RBC: 4.89 MIL/uL (ref 3.87–5.11)
RDW: 14.6 % (ref 11.5–15.5)
WBC: 6.3 10*3/uL (ref 4.0–10.5)

## 2017-12-26 LAB — URINALYSIS, ROUTINE W REFLEX MICROSCOPIC
Bilirubin Urine: NEGATIVE
Glucose, UA: NEGATIVE mg/dL
KETONES UR: NEGATIVE mg/dL
Leukocytes, UA: NEGATIVE
NITRITE: NEGATIVE
PROTEIN: NEGATIVE mg/dL
Specific Gravity, Urine: 1.005 (ref 1.005–1.030)
pH: 6 (ref 5.0–8.0)

## 2017-12-26 LAB — CYTOLOGY - PAP: DIAGNOSIS: NEGATIVE

## 2017-12-26 LAB — ABO/RH: ABO/RH(D): A NEG

## 2017-12-27 ENCOUNTER — Telehealth: Payer: Self-pay | Admitting: *Deleted

## 2017-12-27 NOTE — Telephone Encounter (Signed)
I called to tell the patient her pap smear results, per Joylene John, NP her pap smear was normal.  Patient was very appreciative and verbalized understanding.

## 2017-12-31 ENCOUNTER — Encounter (HOSPITAL_COMMUNITY)
Admission: RE | Disposition: A | Discharge status: Home / Self Care | Payer: Self-pay | Source: Ambulatory Visit | Attending: Gynecologic Oncology

## 2017-12-31 ENCOUNTER — Ambulatory Visit (HOSPITAL_COMMUNITY): Payer: Self-pay | Admitting: Anesthesiology

## 2017-12-31 ENCOUNTER — Ambulatory Visit (HOSPITAL_COMMUNITY)
Admission: RE | Admit: 2017-12-31 | Discharge: 2017-12-31 | Disposition: A | Discharge status: Home / Self Care | Payer: Self-pay | Source: Ambulatory Visit | Attending: Gynecologic Oncology | Admitting: Gynecologic Oncology

## 2017-12-31 ENCOUNTER — Encounter (HOSPITAL_COMMUNITY): Payer: Self-pay

## 2017-12-31 DIAGNOSIS — N838 Other noninflammatory disorders of ovary, fallopian tube and broad ligament: Secondary | ICD-10-CM

## 2017-12-31 DIAGNOSIS — N83202 Unspecified ovarian cyst, left side: Secondary | ICD-10-CM

## 2017-12-31 DIAGNOSIS — D259 Leiomyoma of uterus, unspecified: Secondary | ICD-10-CM | POA: Insufficient documentation

## 2017-12-31 DIAGNOSIS — N83201 Unspecified ovarian cyst, right side: Secondary | ICD-10-CM

## 2017-12-31 DIAGNOSIS — D271 Benign neoplasm of left ovary: Secondary | ICD-10-CM | POA: Insufficient documentation

## 2017-12-31 DIAGNOSIS — Z9989 Dependence on other enabling machines and devices: Secondary | ICD-10-CM | POA: Insufficient documentation

## 2017-12-31 DIAGNOSIS — G4733 Obstructive sleep apnea (adult) (pediatric): Secondary | ICD-10-CM | POA: Insufficient documentation

## 2017-12-31 DIAGNOSIS — D270 Benign neoplasm of right ovary: Secondary | ICD-10-CM | POA: Insufficient documentation

## 2017-12-31 DIAGNOSIS — F419 Anxiety disorder, unspecified: Secondary | ICD-10-CM | POA: Insufficient documentation

## 2017-12-31 DIAGNOSIS — Z87891 Personal history of nicotine dependence: Secondary | ICD-10-CM | POA: Insufficient documentation

## 2017-12-31 HISTORY — PX: ROBOTIC ASSISTED TOTAL HYSTERECTOMY WITH BILATERAL SALPINGO OOPHERECTOMY: SHX6086

## 2017-12-31 LAB — TYPE AND SCREEN
ABO/RH(D): A NEG
ANTIBODY SCREEN: NEGATIVE

## 2017-12-31 SURGERY — HYSTERECTOMY, TOTAL, ROBOT-ASSISTED, LAPAROSCOPIC, WITH BILATERAL SALPINGO-OOPHORECTOMY
Anesthesia: General

## 2017-12-31 MED ORDER — LIDOCAINE 2% (20 MG/ML) 5 ML SYRINGE
INTRAMUSCULAR | Status: AC
  Filled 2017-12-31: qty 5

## 2017-12-31 MED ORDER — OXYCODONE HCL 5 MG PO TABS
ORAL_TABLET | ORAL | Status: AC
  Filled 2017-12-31: qty 1

## 2017-12-31 MED ORDER — ACETAMINOPHEN 650 MG RE SUPP
650.0000 mg | RECTAL | Status: DC | PRN
  Filled 2017-12-31: qty 1

## 2017-12-31 MED ORDER — FENTANYL CITRATE (PF) 100 MCG/2ML IJ SOLN: 25.0000 ug | INTRAMUSCULAR | Status: DC | PRN

## 2017-12-31 MED ORDER — SUGAMMADEX SODIUM 200 MG/2ML IV SOLN
INTRAVENOUS | Status: DC | PRN
  Administered 2017-12-31: 200 mg via INTRAVENOUS

## 2017-12-31 MED ORDER — LACTATED RINGERS IV SOLN
INTRAVENOUS | Status: DC
  Administered 2017-12-31: 1000 mL via INTRAVENOUS

## 2017-12-31 MED ORDER — ROCURONIUM BROMIDE 10 MG/ML (PF) SYRINGE
PREFILLED_SYRINGE | INTRAVENOUS | Status: AC
  Filled 2017-12-31: qty 10

## 2017-12-31 MED ORDER — DEXAMETHASONE SODIUM PHOSPHATE 10 MG/ML IJ SOLN
INTRAMUSCULAR | Status: AC
  Filled 2017-12-31: qty 1

## 2017-12-31 MED ORDER — LIP MEDEX EX OINT
TOPICAL_OINTMENT | CUTANEOUS | Status: AC
  Filled 2017-12-31: qty 7

## 2017-12-31 MED ORDER — LACTATED RINGERS IR SOLN
Status: DC | PRN
  Administered 2017-12-31: 1000 mL

## 2017-12-31 MED ORDER — STERILE WATER FOR INJECTION IJ SOLN: INTRAMUSCULAR | Status: DC | PRN

## 2017-12-31 MED ORDER — EPHEDRINE 5 MG/ML INJ
INTRAVENOUS | Status: AC
  Filled 2017-12-31: qty 10

## 2017-12-31 MED ORDER — LIDOCAINE 2% (20 MG/ML) 5 ML SYRINGE
INTRAMUSCULAR | Status: DC | PRN
  Administered 2017-12-31: 50 mg via INTRAVENOUS

## 2017-12-31 MED ORDER — MIDAZOLAM HCL 5 MG/5ML IJ SOLN
INTRAMUSCULAR | Status: DC | PRN
  Administered 2017-12-31: 2 mg via INTRAVENOUS

## 2017-12-31 MED ORDER — OXYCODONE HCL 5 MG PO TABS
5.0000 mg | ORAL_TABLET | Freq: Once | ORAL | Status: AC | PRN
  Administered 2017-12-31: 5 mg via ORAL

## 2017-12-31 MED ORDER — SCOPOLAMINE 1 MG/3DAYS TD PT72
1.0000 | MEDICATED_PATCH | TRANSDERMAL | Status: DC
  Administered 2017-12-31: 1.5 mg via TRANSDERMAL
  Filled 2017-12-31: qty 1

## 2017-12-31 MED ORDER — HYDROMORPHONE HCL 1 MG/ML IJ SOLN
INTRAMUSCULAR | Status: DC | PRN
  Administered 2017-12-31 (×4): 0.5 mg via INTRAVENOUS

## 2017-12-31 MED ORDER — SODIUM CHLORIDE 0.9 % IV SOLN: 250.0000 mL | INTRAVENOUS | Status: DC | PRN

## 2017-12-31 MED ORDER — ONDANSETRON HCL 4 MG/2ML IJ SOLN: 4.0000 mg | Freq: Once | INTRAMUSCULAR | Status: DC | PRN

## 2017-12-31 MED ORDER — MIDAZOLAM HCL 2 MG/2ML IJ SOLN
INTRAMUSCULAR | Status: AC
  Filled 2017-12-31: qty 2

## 2017-12-31 MED ORDER — PROPOFOL 10 MG/ML IV BOLUS
INTRAVENOUS | Status: AC
  Filled 2017-12-31: qty 20

## 2017-12-31 MED ORDER — FENTANYL CITRATE (PF) 250 MCG/5ML IJ SOLN
INTRAMUSCULAR | Status: AC
  Filled 2017-12-31: qty 5

## 2017-12-31 MED ORDER — ACETAMINOPHEN 500 MG PO TABS
1000.0000 mg | ORAL_TABLET | ORAL | Status: AC
  Administered 2017-12-31: 1000 mg via ORAL
  Filled 2017-12-31: qty 2

## 2017-12-31 MED ORDER — ROCURONIUM BROMIDE 10 MG/ML (PF) SYRINGE
PREFILLED_SYRINGE | INTRAVENOUS | Status: DC | PRN
  Administered 2017-12-31: 10 mg via INTRAVENOUS
  Administered 2017-12-31: 50 mg via INTRAVENOUS

## 2017-12-31 MED ORDER — KETOROLAC TROMETHAMINE 15 MG/ML IJ SOLN: 15.0000 mg | Freq: Four times a day (QID) | INTRAMUSCULAR | Status: DC

## 2017-12-31 MED ORDER — CELECOXIB 200 MG PO CAPS
400.0000 mg | ORAL_CAPSULE | ORAL | Status: AC
  Administered 2017-12-31: 400 mg via ORAL
  Filled 2017-12-31: qty 2

## 2017-12-31 MED ORDER — HYDROMORPHONE HCL 2 MG/ML IJ SOLN
INTRAMUSCULAR | Status: AC
  Filled 2017-12-31: qty 1

## 2017-12-31 MED ORDER — FENTANYL CITRATE (PF) 100 MCG/2ML IJ SOLN
INTRAMUSCULAR | Status: DC | PRN
  Administered 2017-12-31: 100 ug via INTRAVENOUS
  Administered 2017-12-31 (×3): 50 ug via INTRAVENOUS

## 2017-12-31 MED ORDER — DEXAMETHASONE SODIUM PHOSPHATE 4 MG/ML IJ SOLN: 4.0000 mg | INTRAMUSCULAR | Status: DC

## 2017-12-31 MED ORDER — EPHEDRINE SULFATE-NACL 50-0.9 MG/10ML-% IV SOSY
PREFILLED_SYRINGE | INTRAVENOUS | Status: DC | PRN
  Administered 2017-12-31 (×2): 5 mg via INTRAVENOUS

## 2017-12-31 MED ORDER — OXYCODONE HCL 5 MG/5ML PO SOLN
5.0000 mg | Freq: Once | ORAL | Status: AC | PRN
  Filled 2017-12-31: qty 5

## 2017-12-31 MED ORDER — DEXAMETHASONE SODIUM PHOSPHATE 10 MG/ML IJ SOLN
INTRAMUSCULAR | Status: DC | PRN
  Administered 2017-12-31: 10 mg via INTRAVENOUS

## 2017-12-31 MED ORDER — ONDANSETRON HCL 4 MG/2ML IJ SOLN
INTRAMUSCULAR | Status: AC
  Filled 2017-12-31: qty 2

## 2017-12-31 MED ORDER — SODIUM CHLORIDE 0.9% FLUSH: 3.0000 mL | INTRAVENOUS | Status: DC | PRN

## 2017-12-31 MED ORDER — SCOPOLAMINE 1 MG/3DAYS TD PT72: 1.0000 | MEDICATED_PATCH | TRANSDERMAL | Status: DC

## 2017-12-31 MED ORDER — SODIUM CHLORIDE 0.9% FLUSH: 3.0000 mL | Freq: Two times a day (BID) | INTRAVENOUS | Status: DC

## 2017-12-31 MED ORDER — GABAPENTIN 300 MG PO CAPS
300.0000 mg | ORAL_CAPSULE | ORAL | Status: AC
  Administered 2017-12-31: 300 mg via ORAL
  Filled 2017-12-31: qty 1

## 2017-12-31 MED ORDER — CEFAZOLIN SODIUM-DEXTROSE 2-4 GM/100ML-% IV SOLN
2.0000 g | INTRAVENOUS | Status: AC
  Administered 2017-12-31: 2 g via INTRAVENOUS
  Filled 2017-12-31: qty 100

## 2017-12-31 MED ORDER — STERILE WATER FOR IRRIGATION IR SOLN
Status: DC | PRN
  Administered 2017-12-31: 1000 mL

## 2017-12-31 MED ORDER — OXYCODONE-ACETAMINOPHEN 5-325 MG PO TABS: 1.0000 | ORAL_TABLET | ORAL | 0 refills | Status: DC | PRN

## 2017-12-31 MED ORDER — PROPOFOL 10 MG/ML IV BOLUS
INTRAVENOUS | Status: DC | PRN
  Administered 2017-12-31: 160 mg via INTRAVENOUS

## 2017-12-31 MED ORDER — OXYCODONE HCL 5 MG PO TABS: 5.0000 mg | ORAL_TABLET | ORAL | Status: DC | PRN

## 2017-12-31 MED ORDER — ACETAMINOPHEN 325 MG PO TABS: 650.0000 mg | ORAL_TABLET | ORAL | Status: DC | PRN

## 2017-12-31 SURGICAL SUPPLY — 94 items
APPLICATOR SURGIFLO ENDO (HEMOSTASIS) IMPLANT
ATTRACTOMAT 16X20 MAGNETIC DRP (DRAPES) IMPLANT
BAG LAPAROSCOPIC 12 15 PORT 16 (BASKET) IMPLANT
BAG RETRIEVAL 12/15 (BASKET)
BLADE EXTENDED COATED 6.5IN (ELECTRODE) IMPLANT
CELLS DAT CNTRL 66122 CELL SVR (MISCELLANEOUS) IMPLANT
CHLORAPREP W/TINT 26ML (MISCELLANEOUS) ×6 IMPLANT
CLIP VESOCCLUDE LG 6/CT (CLIP) ×3 IMPLANT
CLIP VESOCCLUDE MED 6/CT (CLIP) IMPLANT
CLIP VESOCCLUDE MED LG 6/CT (CLIP) IMPLANT
CONT SPEC 4OZ CLIKSEAL STRL BL (MISCELLANEOUS) ×3 IMPLANT
COVER BACK TABLE 60X90IN (DRAPES) ×3 IMPLANT
COVER TIP SHEARS 8 DVNC (MISCELLANEOUS) ×2 IMPLANT
COVER TIP SHEARS 8MM DA VINCI (MISCELLANEOUS) ×1
DERMABOND ADVANCED (GAUZE/BANDAGES/DRESSINGS) ×1
DERMABOND ADVANCED .7 DNX12 (GAUZE/BANDAGES/DRESSINGS) ×2 IMPLANT
DRAPE ARM DVNC X/XI (DISPOSABLE) ×8 IMPLANT
DRAPE COLUMN DVNC XI (DISPOSABLE) ×2 IMPLANT
DRAPE DA VINCI XI ARM (DISPOSABLE) ×4
DRAPE DA VINCI XI COLUMN (DISPOSABLE) ×1
DRAPE INCISE IOBAN 66X45 STRL (DRAPES) IMPLANT
DRAPE SHEET LG 3/4 BI-LAMINATE (DRAPES) ×3 IMPLANT
DRAPE SURG IRRIG POUCH 19X23 (DRAPES) ×3 IMPLANT
DRAPE WARM FLUID 44X44 (DRAPE) IMPLANT
DRSG OPSITE POSTOP 4X10 (GAUZE/BANDAGES/DRESSINGS) IMPLANT
DRSG OPSITE POSTOP 4X6 (GAUZE/BANDAGES/DRESSINGS) IMPLANT
DRSG OPSITE POSTOP 4X8 (GAUZE/BANDAGES/DRESSINGS) IMPLANT
ELECT REM PT RETURN 15FT ADLT (MISCELLANEOUS) ×3 IMPLANT
GAUZE 4X4 16PLY RFD (DISPOSABLE) IMPLANT
GLOVE BIO SURGEON STRL SZ 6 (GLOVE) ×12 IMPLANT
GLOVE BIO SURGEON STRL SZ 6.5 (GLOVE) ×6 IMPLANT
GOWN STRL REUS W/ TWL LRG LVL3 (GOWN DISPOSABLE) ×4 IMPLANT
GOWN STRL REUS W/TWL LRG LVL3 (GOWN DISPOSABLE) ×2
HEMOSTAT ARISTA ABSORB 3G PWDR (MISCELLANEOUS) IMPLANT
HOLDER FOLEY CATH W/STRAP (MISCELLANEOUS) ×3 IMPLANT
IRRIG SUCT STRYKERFLOW 2 WTIP (MISCELLANEOUS) ×3
IRRIGATION SUCT STRKRFLW 2 WTP (MISCELLANEOUS) ×2 IMPLANT
KIT BASIN OR (CUSTOM PROCEDURE TRAY) ×3 IMPLANT
KIT PROCEDURE DA VINCI SI (MISCELLANEOUS)
KIT PROCEDURE DVNC SI (MISCELLANEOUS) IMPLANT
LIGASURE IMPACT 36 18CM CVD LR (INSTRUMENTS) IMPLANT
LOOP VESSEL MAXI BLUE (MISCELLANEOUS) IMPLANT
MANIPULATOR UTERINE 4.5 ZUMI (MISCELLANEOUS) ×3 IMPLANT
NEEDLE HYPO 22GX1.5 SAFETY (NEEDLE) IMPLANT
NEEDLE SPNL 18GX3.5 QUINCKE PK (NEEDLE) IMPLANT
OBTURATOR OPTICAL STANDARD 8MM (TROCAR) ×1
OBTURATOR OPTICAL STND 8 DVNC (TROCAR) ×2
OBTURATOR OPTICALSTD 8 DVNC (TROCAR) ×2 IMPLANT
PACK GENERAL/GYN (CUSTOM PROCEDURE TRAY) IMPLANT
PACK ROBOT GYN CUSTOM WL (TRAY / TRAY PROCEDURE) ×3 IMPLANT
PAD POSITIONING PINK XL (MISCELLANEOUS) ×3 IMPLANT
PORT ACCESS TROCAR AIRSEAL 12 (TROCAR) ×2 IMPLANT
PORT ACCESS TROCAR AIRSEAL 5M (TROCAR) ×1
POUCH SPECIMEN RETRIEVAL 10MM (ENDOMECHANICALS) ×3 IMPLANT
RELOAD PROXIMATE 75MM BLUE (ENDOMECHANICALS) IMPLANT
RELOAD PROXIMATE TA60MM BLUE (ENDOMECHANICALS) IMPLANT
RETRACTOR WND ALEXIS 25 LRG (MISCELLANEOUS) IMPLANT
RTRCTR WOUND ALEXIS 18CM MED (MISCELLANEOUS)
RTRCTR WOUND ALEXIS 25CM LRG (MISCELLANEOUS)
SEAL CANN UNIV 5-8 DVNC XI (MISCELLANEOUS) ×8 IMPLANT
SEAL XI 5MM-8MM UNIVERSAL (MISCELLANEOUS) ×4
SET TRI-LUMEN FLTR TB AIRSEAL (TUBING) ×3 IMPLANT
SHEET LAVH (DRAPES) IMPLANT
SPOGE SURGIFLO 8M (HEMOSTASIS)
SPONGE LAP 18X18 RF (DISPOSABLE) IMPLANT
SPONGE SURGIFLO 8M (HEMOSTASIS) IMPLANT
STAPLER GUN LINEAR PROX 60 (STAPLE) IMPLANT
STAPLER PROXIMATE 75MM BLUE (STAPLE) IMPLANT
STAPLER VISISTAT 35W (STAPLE) IMPLANT
SURGIFLO W/THROMBIN 8M KIT (HEMOSTASIS) IMPLANT
SUT MNCRL AB 4-0 PS2 18 (SUTURE) IMPLANT
SUT PDS AB 1 TP1 96 (SUTURE) IMPLANT
SUT SILK 3 0 SH CR/8 (SUTURE) IMPLANT
SUT VIC AB 0 CT1 27 (SUTURE)
SUT VIC AB 0 CT1 27XBRD ANTBC (SUTURE) IMPLANT
SUT VIC AB 0 CT1 36 (SUTURE) ×3 IMPLANT
SUT VIC AB 2-0 CT1 36 (SUTURE) IMPLANT
SUT VIC AB 2-0 CT2 27 (SUTURE) IMPLANT
SUT VIC AB 2-0 SH 27 (SUTURE)
SUT VIC AB 2-0 SH 27X BRD (SUTURE) IMPLANT
SUT VIC AB 3-0 CTX 36 (SUTURE) IMPLANT
SUT VIC AB 3-0 SH 18 (SUTURE) IMPLANT
SUT VIC AB 3-0 SH 27 (SUTURE)
SUT VIC AB 3-0 SH 27X BRD (SUTURE) IMPLANT
SUT VICRYL 0 UR6 27IN ABS (SUTURE) ×3 IMPLANT
SYR 10ML LL (SYRINGE) IMPLANT
SYR 30ML LL (SYRINGE) ×6 IMPLANT
TOWEL OR 17X26 10 PK STRL BLUE (TOWEL DISPOSABLE) ×3 IMPLANT
TOWEL OR NON WOVEN STRL DISP B (DISPOSABLE) ×3 IMPLANT
TRAP SPECIMEN MUCOUS 40CC (MISCELLANEOUS) ×3 IMPLANT
TRAY FOLEY MTR SLVR 16FR STAT (SET/KITS/TRAYS/PACK) ×3 IMPLANT
UNDERPAD 30X30 (UNDERPADS AND DIAPERS) ×3 IMPLANT
WATER STERILE IRR 1000ML POUR (IV SOLUTION) ×3 IMPLANT
YANKAUER SUCT BULB TIP 10FT TU (MISCELLANEOUS) ×3 IMPLANT

## 2017-12-31 NOTE — Discharge Instructions (Signed)
12/31/2017  Return to work: 4 weeks  Activity: 1. Be up and out of the bed during the day.  Take a nap if needed.  You may walk up steps but be careful and use the hand rail.  Stair climbing will tire you more than you think, you may need to stop part way and rest.   2. No lifting or straining for 6 weeks.  3. No driving for 1 weeks.  Do Not drive if you are taking narcotic pain medicine.  4. Shower daily.  Use soap and water on your incision and pat dry; don't rub.   5. No sexual activity and nothing in the vagina for 8 weeks.  Medications:  - Take ibuprofen and tylenol first line for pain control. Take these regularly (every 6 hours) to decrease the build up of pain.  - If necessary, for severe pain not relieved by ibuprofen, take percocet.  - While taking percocet you should take sennakot every night to reduce the likelihood of constipation. If this causes diarrhea, stop its use.  Diet: 1. Low sodium Heart Healthy Diet is recommended.  2. It is safe to use a laxative if you have difficulty moving your bowels.   Wound Care: 1. Keep clean and dry.  Shower daily.  Reasons to call the Doctor:   Fever - Oral temperature greater than 100.4 degrees Fahrenheit  Foul-smelling vaginal discharge  Difficulty urinating  Nausea and vomiting  Increased pain at the site of the incision that is unrelieved with pain medicine.  Difficulty breathing with or without chest pain  New calf pain especially if only on one side  Sudden, continuing increased vaginal bleeding with or without clots.   Follow-up: 1. See Everitt Amber in 4 weeks.  Contacts: For questions or concerns you should contact:  Dr. Everitt Amber at [phone] After hours and on week-ends call [phone] and ask to speak to the physician on call for Gynecologic Oncology

## 2017-12-31 NOTE — Anesthesia Preprocedure Evaluation (Addendum)
Anesthesia Evaluation  Patient identified by MRN, date of birth, ID band Patient awake    Reviewed: Allergy & Precautions, NPO status , Patient's Chart, lab work & pertinent test results  History of Anesthesia Complications Negative for: history of anesthetic complications  Airway Mallampati: II  TM Distance: >3 FB Neck ROM: Full    Dental no notable dental hx. (+) Teeth Intact   Pulmonary asthma , sleep apnea and Continuous Positive Airway Pressure Ventilation , former smoker,  Mild asthma, no longer uses inhalers, exacerbations with URI only   breath sounds clear to auscultation       Cardiovascular negative cardio ROS   Rhythm:Regular Rate:Normal     Neuro/Psych PSYCHIATRIC DISORDERS Anxiety Depression negative neurological ROS     GI/Hepatic negative GI ROS, Neg liver ROS,   Endo/Other  negative endocrine ROS  Renal/GU negative Renal ROS  negative genitourinary   Musculoskeletal  (+) Arthritis ,   Abdominal (+) + obese,   Peds  Hematology negative hematology ROS (+)   Anesthesia Other Findings   Reproductive/Obstetrics negative OB ROS                            Anesthesia Physical Anesthesia Plan  ASA: II  Anesthesia Plan: General   Post-op Pain Management:    Induction:   PONV Risk Score and Plan: 4 or greater and Ondansetron, Dexamethasone, Treatment may vary due to age or medical condition, Midazolam and Scopolamine patch - Pre-op  Airway Management Planned: Oral ETT  Additional Equipment:   Intra-op Plan:   Post-operative Plan: Extubation in OR  Informed Consent: I have reviewed the patients History and Physical, chart, labs and discussed the procedure including the risks, benefits and alternatives for the proposed anesthesia with the patient or authorized representative who has indicated his/her understanding and acceptance.     Plan Discussed with:    Anesthesia Plan Comments:        Anesthesia Quick Evaluation

## 2017-12-31 NOTE — Op Note (Signed)
OPERATIVE NOTE 12/31/17  Surgeon: Donaciano Eva   Assistants: Dr Lahoma Crocker (an MD assistant was necessary for tissue manipulation, management of robotic instrumentation, retraction and positioning due to the complexity of the case and hospital policies).   Anesthesia: General endotracheal anesthesia  ASA Class: 3   Pre-operative Diagnosis: bilateral ovarian masses  Post-operative Diagnosis: same  Operation: Robotic-assisted laparoscopic total hysterectomy with bilateral salpingoophorectomy   Surgeon: Donaciano Eva  Assistant Surgeon: Lahoma Crocker MD  Anesthesia: GET  Urine Output: 300cc (foley removed at completion of case)  Operative Findings:  : 6cm normal appearing uterus, bilateral enlarged, complex cystic masses (approximately 6cm each). Frozen section revealed benign masses.   Estimated Blood Loss:  less than 50 mL      Total IV Fluids: 800 ml         Specimens: washings, uterus with cervix and bilateral tubes and ovaries.          Complications:  None; patient tolerated the procedure well.         Disposition: PACU - hemodynamically stable.  Procedure Details  The patient was seen in the Holding Room. The risks, benefits, complications, treatment options, and expected outcomes were discussed with the patient.  The patient concurred with the proposed plan, giving informed consent.  The site of surgery properly noted/marked. The patient was identified as Erika [name] and the procedure verified as a Robotic-assisted hysterectomy with bilateral salpingo oophorectomy. A Time Out was held and the above information confirmed.  After induction of anesthesia, the patient was draped and prepped in the usual sterile manner. Pt was placed in supine position after anesthesia and draped and prepped in the usual sterile manner. The abdominal drape was placed after the CholoraPrep had been allowed to dry for 3 minutes.  Her arms were tucked to her side with  all appropriate precautions.  The shoulders were stabilized with padded shoulder blocks applied to the acromium processes.  The patient was placed in the semi-lithotomy position in [place].  The perineum was prepped with Betadine. The patient was then prepped. Foley catheter was placed.  A sterile speculum was placed in the vagina.  The cervix was grasped with a single-tooth tenaculum and dilated with Kennon Rounds dilators.  The ZUMI uterine manipulator with a medium colpotomizer ring was placed without difficulty.  A pneum occluder balloon was placed over the manipulator.  OG tube placement was confirmed and to suction.   Next, a 5 mm skin incision was made 1 cm below the subcostal margin in the midclavicular line.  The 5 mm Optiview port and scope was used for direct entry.  Opening pressure was under 10 mm CO2.  The abdomen was insufflated and the findings were noted as above.   At this point and all points during the procedure, the patient's intra-abdominal pressure did not exceed 15 mmHg. Next, a 10 mm skin incision was made in the umbilicus and a right and left port was placed about 10 cm lateral to the robot port on the right and left side.  All ports were placed under direct visualization.  The patient was placed in steep Trendelenburg.  Bowel was folded away into the upper abdomen.  The robot was docked in the normal manner. Washings were obtained.   The hysterectomy was started after the round ligament on the right side was incised and the retroperitoneum was entered and the pararectal space was developed.  The ureter was noted to be on the medial leaf of  the broad ligament.  The peritoneum above the ureter was incised and stretched and the infundibulopelvic ligament was skeletonized, cauterized and cut.  The posterior peritoneum was taken down to the level of the KOH ring.  The anterior peritoneum was also taken down.  The bladder flap was created to the level of the KOH ring.  The uterine artery on  the right side was skeletonized, cauterized and cut in the normal manner.  A similar procedure was performed on the left.  The colpotomy was made and the uterus, cervix, bilateral ovaries and tubes were amputated and delivered through the vagina.  Pedicles were inspected and excellent hemostasis was achieved.    The colpotomy at the vaginal cuff was closed with Vicryl on a CT1 needle in a running manner.  Irrigation was used and excellent hemostasis was achieved.  At this point in the procedure was completed.  Robotic instruments were removed under direct visulaization.  The robot was undocked. The 10 mm ports were closed with Vicryl on a UR-5 needle and the fascia was closed with 0 Vicryl on a UR-5 needle.  The skin was closed with 4-0 Vicryl in a subcuticular manner.  Dermabond was applied.  Sponge, lap and needle counts correct x 2.  The patient was taken to the recovery room in stable condition.  The vagina was swabbed with  minimal bleeding noted.   All instrument and needle counts were correct x  3.   The patient was transferred to the recovery room in a stable condition.  Donaciano Eva, MD

## 2017-12-31 NOTE — Interval H&P Note (Signed)
History and Physical Interval Note:  12/31/2017 7:21 AM  Erika [name]  has presented today for surgery, with the diagnosis of BILATERAL OVARIAN MASSES  The various methods of treatment have been discussed with the patient and family. After consideration of risks, benefits and other options for treatment, the patient has consented to  Procedure(s): XI ROBOTIC [place] (Bilateral) POSSIBLE EXPLORATORY LAPAROTOMY WITH STAGING (N/A) as a surgical intervention .  The patient's history has been reviewed, patient examined, no change in status, stable for surgery.  The patient is interested in hysterectomy. I discussed the benefits and risks of this including  bleeding, infection, damage to internal organs (such as bladder,ureters, bowels), blood clot, reoperation and rehospitalization. She is still inclined to proceed with hhysterectomy and BSO.  I have reviewed the patient's chart and labs.  Questions were answered to the patient's satisfaction.     Thereasa Solo

## 2017-12-31 NOTE — Anesthesia Postprocedure Evaluation (Signed)
Anesthesia Post Note  Patient: Erika [name]  Procedure(s) Performed: XI ROBOTIC ASSISTED TOTAL HYSTERECTOMY WITH BILATERAL SALPINGO OOPHORECTOMY (N/A )     Patient location during evaluation: PACU Anesthesia Type: General Level of consciousness: awake and alert Pain management: pain level controlled Vital Signs Assessment: post-procedure vital signs reviewed and stable Respiratory status: spontaneous breathing, nonlabored ventilation, respiratory function stable and patient connected to nasal cannula oxygen Cardiovascular status: blood pressure returned to baseline and stable Postop Assessment: no apparent nausea or vomiting Anesthetic complications: no    Last Vitals:  Vitals:   12/31/17 1120 12/31/17 1230  BP: 106/73 112/72  Pulse:  76  Resp: 16 18  Temp: 36.7 C 36.6 C  SpO2: 95% 96%    Last Pain:  Vitals:   12/31/17 1100  TempSrc:   PainSc: 3                  Lidia Collum

## 2017-12-31 NOTE — Anesthesia Procedure Notes (Signed)
Procedure Name: Intubation Date/Time: 12/31/2017 7:37 AM Performed by: Anne Fu, CRNA Pre-anesthesia Checklist: Patient identified, Emergency Drugs available, Suction available, Patient being monitored and Timeout performed Patient Re-evaluated:Patient Re-evaluated prior to induction Oxygen Delivery Method: Circle system utilized Preoxygenation: Pre-oxygenation with 100% oxygen Induction Type: IV induction Ventilation: Mask ventilation without difficulty Laryngoscope Size: Mac and 4 Grade View: Grade II Tube type: Oral Tube size: 7.5 mm Number of attempts: 1 Airway Equipment and Method: Stylet Placement Confirmation: ETT inserted through vocal cords under direct vision,  positive ETCO2 and breath sounds checked- equal and bilateral Secured at: 21 cm Tube secured with: Tape Dental Injury: Teeth and Oropharynx as per pre-operative assessment  Comments: Intubation preformed by Jarrett Soho EMT from [facility] [place].

## 2017-12-31 NOTE — Transfer of Care (Signed)
Immediate Anesthesia Transfer of Care Note  Patient: Erika [name]  Procedure(s) Performed: Procedure(s): XI ROBOTIC ASSISTED TOTAL HYSTERECTOMY WITH BILATERAL SALPINGO OOPHORECTOMY (N/A)  Patient Location: PACU  Anesthesia Type:General  Level of Consciousness:  sedated, patient cooperative and responds to stimulation  Airway & Oxygen Therapy:Patient Spontanous Breathing and Patient connected to face mask oxgen  Post-op Assessment:  Report given to PACU RN and Post -op Vital signs reviewed and stable  Post vital signs:  Reviewed and stable  Last Vitals:  Vitals:   12/31/17 0531  BP: 134/78  Pulse: 79  Resp: 18  Temp: 36.9 C  SpO2: 13%    Complications: No apparent anesthesia complications

## 2018-01-01 ENCOUNTER — Encounter (HOSPITAL_COMMUNITY): Payer: Self-pay | Admitting: Gynecologic Oncology

## 2018-01-01 ENCOUNTER — Telehealth: Payer: Self-pay

## 2018-01-01 NOTE — Telephone Encounter (Signed)
Told Ms Niziolek that the final pathology was benign. She is doing well post operatively. Her bowels have not moved yet since 12-30-17. She took stool softeners today.  She has miralax on hand.  Suggested she take a capful of this as well.  Pt verbalized understanding.

## 2018-01-16 ENCOUNTER — Other Ambulatory Visit: Payer: Self-pay | Admitting: Family Medicine

## 2018-01-24 ENCOUNTER — Inpatient Hospital Stay: Payer: Self-pay | Attending: Gynecology | Admitting: Gynecologic Oncology

## 2018-01-24 ENCOUNTER — Encounter: Payer: Self-pay | Admitting: Gynecologic Oncology

## 2018-01-24 ENCOUNTER — Telehealth: Payer: Self-pay

## 2018-01-24 VITALS — BP 138/78 | HR 68 | Temp 97.9°F | Resp 18 | Ht 67.0 in | Wt 234.0 lb

## 2018-01-24 DIAGNOSIS — D270 Benign neoplasm of right ovary: Secondary | ICD-10-CM | POA: Insufficient documentation

## 2018-01-24 DIAGNOSIS — Z90722 Acquired absence of ovaries, bilateral: Secondary | ICD-10-CM | POA: Insufficient documentation

## 2018-01-24 DIAGNOSIS — Z90710 Acquired absence of both cervix and uterus: Secondary | ICD-10-CM | POA: Insufficient documentation

## 2018-01-24 DIAGNOSIS — D271 Benign neoplasm of left ovary: Secondary | ICD-10-CM | POA: Insufficient documentation

## 2018-01-24 NOTE — Patient Instructions (Signed)
Contact Dr Denman George with questions about your surgery at [phone].  You do not need pap smears if all of your paps have been normal for the past 20 years.  Please follow-up with Dr Sherren Mocha in a year to enquire about your pap history. Your last pap here on September, 2019 was normal.  Your pathology showed no cancer in the ovaries, uterus or cervix.  Avoid intercourse for 5 more weeks, and avoid heavy lifting for 1 more week.

## 2018-01-24 NOTE — Telephone Encounter (Signed)
ENCOUNTER OPENED IN ERROR

## 2018-01-24 NOTE — Progress Notes (Signed)
Follow-up Note  Assessment : Bilateral benign ovarian masses s/p hysterectomy, BSO on 12/31/17.  Plan:   Follow-up with Dr Sherren Mocha for annual wellness. Can discontinue paps of vagina when 20 years post last abnormal pap.  Follow-up with me on a prn basis.   HPI: 62 year old white married female gravida 2 para 2 referred from the [facility] emergency room for further evaluation of bilateral ovarian masses.  The patient presented to the emergency room on December 19, 2017 with back pain radiating to the lower pelvis.  CT scan showed bilateral pelvic masses measuring 6.9 x 6.6 x 6.6 cm entheses right ovary) and on the left a 6.1 x 3.9 x 4.6 cm cystic mass.  There is solid and cystic components to the mass.  Tumor markers are not available at this time.  There is no other evidence of malignancy including no evidence of adenopathy, hydronephrosis, or ascites.  Patient admits to having some suprapubic pressure and intermittent pain as well as some bloating.  She has a remote history of abnormal Pap smears which resolved spontaneously.  Her last Pap smear in 2014 was normal.  Otherwise she has no past gynecologic history.   Patient does have obstructive sleep apnea and uses CPAP at night.  She does not use oxygen during the day.  Otherwise her functional status is good.  Pap in September, 2019 in our office normal.   Interval Hx:  On 12/31/17 she underwent robotic assisted total hysterectomy, BSO for bilateral cysts. Final pathology confirmed benign bilateral cystadenofibromas. She did well postop.  Review of Systems:10 point review of systems is negative except as noted in interval history.   Vitals: Blood pressure 138/78, pulse 68, temperature 97.9 F (36.6 C), temperature source Oral, resp. rate 18, height 5\' 7"  (1.702 m), weight 234 lb (106.1 kg), SpO2 98 %.  Physical Exam: General : The patient is a healthy woman in no acute distress.  HEENT: normocephalic, extraoccular movements normal;  neck is supple without thyromegally  Lynphnodes: Supraclavicular and inguinal nodes not enlarged  Abdomen: Obese, soft, non-tender, no ascites, no organomegally, no masses, no hernias and well healed incisions.  Pelvic:  Vaginal cuff in tact with no active bleeding, scant old blood     Allergies  Allergen Reactions  . Tetracycline Hcl Rash    Past Medical History:  Diagnosis Date  . Anxiety   . Arthritis   . History of cardiac murmur as a child   . Menopausal syndrome   . Mild asthma    no inhalers since stopped smoking  . Neuroma of foot   . OSA on CPAP   . Wears glasses     Past Surgical History:  Procedure Laterality Date  . ELBOW SURGERY Right 2002   repair tendon and nerve injury  . EXCISION MORTON'S NEUROMA Left 2010  . ROBOTIC ASSISTED TOTAL HYSTERECTOMY WITH BILATERAL SALPINGO OOPHERECTOMY N/A 12/31/2017   Procedure: XI ROBOTIC ASSISTED TOTAL HYSTERECTOMY WITH BILATERAL SALPINGO OOPHORECTOMY;  Surgeon: Everitt Amber, MD;  Location: [facility];  Service: Gynecology;  Laterality: N/A;  . SHOULDER ARTHROSCOPY W/ ROTATOR CUFF REPAIR Left 09/2016   and burectomy    Current Outpatient Medications  Medication Sig Dispense Refill  . citalopram (CELEXA) 40 MG tablet TAKE 2 TABLETS BY MOUTH AT BEDTIME 180 tablet 0  . ibuprofen (ADVIL,MOTRIN) 200 MG tablet Take 600 mg by mouth every 6 (six) hours as needed for moderate pain.     No current facility-administered medications for this visit.  Social History   Socioeconomic History  . Marital status: Married    Spouse name: Not on file  . Number of children: Not on file  . Years of education: Not on file  . Highest education level: Not on file  Occupational History  . Occupation: Solicitor: [employer]  Social Needs  . Financial resource strain: Not on file  . Food insecurity:    Worry: Not on file    Inability: Not on file  . Transportation needs:    Medical: Not on file    Non-medical:  Not on file  Tobacco Use  . Smoking status: Former Smoker    Packs/day: 1.00    Years: 30.00    Pack years: 30.00    Types: Cigarettes    Last attempt to quit: 06/01/2000    Years since quitting: 17.6  . Smokeless tobacco: Never Used  Substance and Sexual Activity  . Alcohol use: No  . Drug use: Never  . Sexual activity: Not Currently    Birth control/protection: Post-menopausal  Lifestyle  . Physical activity:    Days per week: Not on file    Minutes per session: Not on file  . Stress: Not on file  Relationships  . Social connections:    Talks on phone: Not on file    Gets together: Not on file    Attends religious service: Not on file    Active member of club or organization: Not on file    Attends meetings of clubs or organizations: Not on file    Relationship status: Not on file  . Intimate partner violence:    Fear of current or ex partner: Not on file    Emotionally abused: Not on file    Physically abused: Not on file    Forced sexual activity: Not on file  Other Topics Concern  . Not on file  Social History Narrative  . Not on file    Family History  Problem Relation Age of Onset  . Diabetes Mother   . Heart disease Mother   . Diabetes Maternal Grandmother   . Diabetes Maternal Grandfather   . Asthma Sister   . Cancer Paternal Grandmother   . Cancer Maternal Uncle   . Uterine cancer Maternal Uncle   . Cancer Maternal Aunt   . Cancer Brother       Thereasa Solo, MD 01/24/2018, 12:32 PM

## 2018-04-06 ENCOUNTER — Encounter: Payer: Self-pay | Admitting: Internal Medicine

## 2018-04-18 ENCOUNTER — Other Ambulatory Visit: Payer: Self-pay | Admitting: *Deleted

## 2018-04-18 MED ORDER — CITALOPRAM HYDROBROMIDE 40 MG PO TABS: 80.0000 mg | ORAL_TABLET | Freq: Every day | ORAL | 0 refills | Status: DC

## 2018-07-31 ENCOUNTER — Telehealth: Payer: Self-pay | Admitting: *Deleted

## 2018-07-31 NOTE — Telephone Encounter (Signed)
Appointment made

## 2018-07-31 NOTE — Telephone Encounter (Signed)
Pt returning your call.  Dr Jerilee Hoh OK for her to go with, she will stay at [facility].  And she is about out of her med.

## 2018-07-31 NOTE — Telephone Encounter (Signed)
Left message on machine for patient to schedule a TOC CRM

## 2018-08-01 ENCOUNTER — Other Ambulatory Visit: Payer: Self-pay

## 2018-08-01 ENCOUNTER — Ambulatory Visit (INDEPENDENT_AMBULATORY_CARE_PROVIDER_SITE_OTHER): Payer: Self-pay | Admitting: Internal Medicine

## 2018-08-01 DIAGNOSIS — F3342 Major depressive disorder, recurrent, in full remission: Secondary | ICD-10-CM

## 2018-08-01 DIAGNOSIS — E538 Deficiency of other specified B group vitamins: Secondary | ICD-10-CM

## 2018-08-01 DIAGNOSIS — N83202 Unspecified ovarian cyst, left side: Secondary | ICD-10-CM

## 2018-08-01 DIAGNOSIS — N83201 Unspecified ovarian cyst, right side: Secondary | ICD-10-CM

## 2018-08-01 DIAGNOSIS — E039 Hypothyroidism, unspecified: Secondary | ICD-10-CM

## 2018-08-01 DIAGNOSIS — R5383 Other fatigue: Secondary | ICD-10-CM

## 2018-08-01 DIAGNOSIS — G4733 Obstructive sleep apnea (adult) (pediatric): Secondary | ICD-10-CM

## 2018-08-01 DIAGNOSIS — J4520 Mild intermittent asthma, uncomplicated: Secondary | ICD-10-CM

## 2018-08-01 DIAGNOSIS — J45909 Unspecified asthma, uncomplicated: Secondary | ICD-10-CM | POA: Insufficient documentation

## 2018-08-01 MED ORDER — LEVOTHYROXINE SODIUM 25 MCG PO TABS: 25.0000 ug | ORAL_TABLET | Freq: Every day | ORAL | 1 refills | Status: DC

## 2018-08-01 MED ORDER — CITALOPRAM HYDROBROMIDE 40 MG PO TABS: 80.0000 mg | ORAL_TABLET | Freq: Every day | ORAL | 0 refills | Status: DC

## 2018-08-01 NOTE — Progress Notes (Signed)
Virtual Visit via Video Note  I connected with Erika [name] on 08/01/18 at  9:30 AM EDT by a video enabled telemedicine application and verified that I am speaking with the correct person using two identifiers.  Location patient: home Location provider: work office Persons participating in the virtual visit: patient, provider  I discussed the limitations of evaluation and management by telemedicine and the availability of in person appointments. The patient expressed understanding and agreed to proceed.   HPI: This is a scheduled visit to establish care and for f/u of chronic medical conditions.  PMH is significant for:  1. Mild asthma, not on meds.  2. OSA on qhs CPAP  3. Depression, stable mood on high dose celexa.  4. Hypothyroidism, on synthroid  5. Bilateral ovarian cysts s/p hysterectomy and BSO in 9/19.  5. H/o B12 deficiency not on supplementation, but used to be on monthly IM injections.  She needs medication refills today.  She thinks she may be developing DM: she is very fatigued and shaky. Improves after a carb load. Strong FH of DM: both parents and both sets of grandparents were "bad" diabetics.   ROS: Constitutional: Denies fever, chills, diaphoresis, appetite change and fatigue.  HEENT: Denies photophobia, eye pain, redness, hearing loss, ear pain, congestion, sore throat, rhinorrhea, sneezing, mouth sores, trouble swallowing, neck pain, neck stiffness and tinnitus.   Respiratory: Denies SOB, DOE, cough, chest tightness,  and wheezing.   Cardiovascular: Denies chest pain, palpitations and leg swelling.  Gastrointestinal: Denies nausea, vomiting, abdominal pain, diarrhea, constipation, blood in stool and abdominal distention.  Genitourinary: Denies dysuria, urgency, frequency, hematuria, flank pain and difficulty urinating.  Endocrine: Denies: hot or cold intolerance, sweats, changes in hair or nails, polyuria, polydipsia. Musculoskeletal: Denies  myalgias, back pain, joint swelling, arthralgias and gait problem.  Skin: Denies pallor, rash and wound.  Neurological: Denies dizziness, seizures, syncope, weakness, light-headedness, numbness and headaches.  Hematological: Denies adenopathy. Easy bruising, personal or family bleeding history  Psychiatric/Behavioral: Denies suicidal ideation, mood changes, confusion, nervousness, sleep disturbance and agitation   Past Medical History:  Diagnosis Date  . Anxiety   . Arthritis   . History of cardiac murmur as a child   . Menopausal syndrome   . Mild asthma    no inhalers since stopped smoking  . Neuroma of foot   . OSA on CPAP   . Wears glasses     Past Surgical History:  Procedure Laterality Date  . ELBOW SURGERY Right 2002   repair tendon and nerve injury  . EXCISION MORTON'S NEUROMA Left 2010  . ROBOTIC ASSISTED TOTAL HYSTERECTOMY WITH BILATERAL SALPINGO OOPHERECTOMY N/A 12/31/2017   Procedure: XI ROBOTIC ASSISTED TOTAL HYSTERECTOMY WITH BILATERAL SALPINGO OOPHORECTOMY;  Surgeon: Everitt Amber, MD;  Location: [facility];  Service: Gynecology;  Laterality: N/A;  . SHOULDER ARTHROSCOPY W/ ROTATOR CUFF REPAIR Left 09/2016   and burectomy    Family History  Problem Relation Age of Onset  . Diabetes Mother   . Heart disease Mother   . Diabetes Maternal Grandmother   . Diabetes Maternal Grandfather   . Asthma Sister   . Cancer Paternal Grandmother   . Cancer Maternal Uncle   . Uterine cancer Maternal Uncle   . Cancer Maternal Aunt   . Cancer Brother     SOCIAL HX:   reports that she quit smoking about 18 years ago. Her smoking use included cigarettes. She has a 30.00 pack-year smoking history. She has never used smokeless tobacco.  She reports that she does not drink alcohol or use drugs.   Current Outpatient Medications:  .  citalopram (CELEXA) 40 MG tablet, Take 2 tablets (80 mg total) by mouth at bedtime., Disp: 180 tablet, Rfl: 0 .  ibuprofen (ADVIL,MOTRIN) 200 MG tablet,  Take 600 mg by mouth every 6 (six) hours as needed for moderate pain., Disp: , Rfl:  .  levothyroxine (SYNTHROID) 25 MCG tablet, Take 1 tablet (25 mcg total) by mouth daily before breakfast., Disp: 90 tablet, Rfl: 1  EXAM:   VITALS per patient if applicable: none reported  GENERAL: alert, oriented, appears well and in no acute distress  HEENT: atraumatic, conjunttiva clear, no obvious abnormalities on inspection of external nose and ears  NECK: normal movements of the head and neck  LUNGS: on inspection no signs of respiratory distress, breathing rate appears normal, no obvious gross increased work of breathing, gasping or wheezing  CV: no obvious cyanosis  MS: moves all visible extremities without noticeable abnormality  PSYCH/NEURO: pleasant and cooperative, no obvious depression or anxiety, speech and thought processing grossly intact  ASSESSMENT AND PLAN:   DEPRESSIVE DSORDER, RCR, FULL REMISSION  -Mood is table. -refill celexa.  Hypothyroidism, unspecified type -On Synthroid. -Check TSH.  Obstructive sleep apnea -CPAP qhs.  B12 deficiency  -Check B12 levels  Mild intermittent asthma without complication -Well controlled, not currently on any meds, does not have a pulmonologist.  Bilateral ovarian cysts -S/p hysterectomy and BSO, no longer needs PAP smears per GYN.  Fatigue, unspecified type  -Check labs including CBC, CMET, TSH, B12. Check A1c given her strong family history and her suspicion that she may now be a diabetic.    I discussed the assessment and treatment plan with the patient. The patient was provided an opportunity to ask questions and all were answered. The patient agreed with the plan and demonstrated an understanding of the instructions.   The patient was advised to call back or seek an in-person evaluation if the symptoms worsen or if the condition fails to improve as anticipated.    Lelon Frohlich, MD  [facility] at  [facility]

## 2018-08-19 ENCOUNTER — Encounter: Payer: Self-pay | Admitting: Internal Medicine

## 2018-08-19 ENCOUNTER — Other Ambulatory Visit (INDEPENDENT_AMBULATORY_CARE_PROVIDER_SITE_OTHER): Payer: Self-pay

## 2018-08-19 ENCOUNTER — Other Ambulatory Visit: Payer: Self-pay

## 2018-08-19 ENCOUNTER — Other Ambulatory Visit: Payer: Self-pay | Admitting: Internal Medicine

## 2018-08-19 DIAGNOSIS — E039 Hypothyroidism, unspecified: Secondary | ICD-10-CM

## 2018-08-19 DIAGNOSIS — E559 Vitamin D deficiency, unspecified: Secondary | ICD-10-CM

## 2018-08-19 DIAGNOSIS — E538 Deficiency of other specified B group vitamins: Secondary | ICD-10-CM

## 2018-08-19 DIAGNOSIS — R5383 Other fatigue: Secondary | ICD-10-CM

## 2018-08-19 LAB — CBC WITH DIFFERENTIAL/PLATELET
Basophils Absolute: 0 10*3/uL (ref 0.0–0.1)
Basophils Relative: 0.7 % (ref 0.0–3.0)
Eosinophils Absolute: 0.1 10*3/uL (ref 0.0–0.7)
Eosinophils Relative: 1.2 % (ref 0.0–5.0)
HCT: 37.7 % (ref 36.0–46.0)
Hemoglobin: 12.4 g/dL (ref 12.0–15.0)
Lymphocytes Relative: 28.3 % (ref 12.0–46.0)
Lymphs Abs: 1.7 10*3/uL (ref 0.7–4.0)
MCHC: 32.9 g/dL (ref 30.0–36.0)
MCV: 80 fl (ref 78.0–100.0)
Monocytes Absolute: 0.4 10*3/uL (ref 0.1–1.0)
Monocytes Relative: 6 % (ref 3.0–12.0)
Neutro Abs: 3.9 10*3/uL (ref 1.4–7.7)
Neutrophils Relative %: 63.8 % (ref 43.0–77.0)
Platelets: 296 10*3/uL (ref 150.0–400.0)
RBC: 4.72 Mil/uL (ref 3.87–5.11)
RDW: 15.1 % (ref 11.5–15.5)
WBC: 6 10*3/uL (ref 4.0–10.5)

## 2018-08-19 LAB — TSH: TSH: 6.68 u[IU]/mL — ABNORMAL HIGH (ref 0.35–4.50)

## 2018-08-19 LAB — LIPID PANEL
Cholesterol: 226 mg/dL — ABNORMAL HIGH (ref 0–200)
HDL: 57.7 mg/dL (ref >39.00)
LDL Cholesterol: 144 mg/dL — ABNORMAL HIGH (ref 0–99)
NonHDL: 167.82
Total CHOL/HDL Ratio: 4
Triglycerides: 117 mg/dL (ref 0.0–149.0)
VLDL: 23.4 mg/dL (ref 0.0–40.0)

## 2018-08-19 LAB — COMPREHENSIVE METABOLIC PANEL
ALT: 16 U/L (ref 0–35)
AST: 17 U/L (ref 0–37)
Albumin: 4.2 g/dL (ref 3.5–5.2)
Alkaline Phosphatase: 81 U/L (ref 39–117)
BUN: 14 mg/dL (ref 6–23)
CO2: 26 mEq/L (ref 19–32)
Calcium: 9 mg/dL (ref 8.4–10.5)
Chloride: 105 mEq/L (ref 96–112)
Creatinine, Ser: 0.67 mg/dL (ref 0.40–1.20)
GFR: 88.71 mL/min (ref >60.00)
Glucose, Bld: 93 mg/dL (ref 70–99)
Potassium: 3.9 mEq/L (ref 3.5–5.1)
Sodium: 140 mEq/L (ref 135–145)
Total Bilirubin: 0.4 mg/dL (ref 0.2–1.2)
Total Protein: 6.6 g/dL (ref 6.0–8.3)

## 2018-08-19 LAB — VITAMIN B12: Vitamin B-12: 199 pg/mL — ABNORMAL LOW (ref 211–911)

## 2018-08-19 LAB — HEMOGLOBIN A1C: Hgb A1c MFr Bld: 5.7 % (ref 4.6–6.5)

## 2018-08-19 LAB — VITAMIN D 25 HYDROXY (VIT D DEFICIENCY, FRACTURES): VITD: 15.04 ng/mL — ABNORMAL LOW (ref 30.00–100.00)

## 2018-08-19 MED ORDER — LEVOTHYROXINE SODIUM 50 MCG PO TABS: 25.0000 ug | ORAL_TABLET | Freq: Every day | ORAL | 2 refills | Status: DC

## 2018-08-19 MED ORDER — VITAMIN D (ERGOCALCIFEROL) 1.25 MG (50000 UNIT) PO CAPS: 50000.0000 [IU] | ORAL_CAPSULE | ORAL | 0 refills | Status: DC

## 2018-08-20 ENCOUNTER — Other Ambulatory Visit: Payer: Self-pay | Admitting: Internal Medicine

## 2018-08-20 ENCOUNTER — Other Ambulatory Visit: Payer: Self-pay | Admitting: *Deleted

## 2018-08-20 DIAGNOSIS — E039 Hypothyroidism, unspecified: Secondary | ICD-10-CM

## 2018-08-20 DIAGNOSIS — E559 Vitamin D deficiency, unspecified: Secondary | ICD-10-CM

## 2018-08-20 MED ORDER — CYANOCOBALAMIN 1000 MCG/ML IJ SOLN: 1000.0000 ug | INTRAMUSCULAR | 4 refills | Status: DC

## 2018-08-20 MED ORDER — "SYRINGE/NEEDLE (DISP) 25G X 5/8"" 1 ML MISC": 1.0000 | 0 refills | Status: DC

## 2018-08-20 MED ORDER — CYANOCOBALAMIN 1000 MCG/ML IJ SOLN: 1000.0000 ug | Freq: Once | INTRAMUSCULAR | 4 refills | Status: DC

## 2018-08-20 MED ORDER — "SYRINGE/NEEDLE (DISP) 25G X 5/8"" 1 ML MISC": 0 refills | Status: DC

## 2018-10-03 ENCOUNTER — Other Ambulatory Visit: Payer: Self-pay

## 2018-10-09 DIAGNOSIS — I509 Heart failure, unspecified: Secondary | ICD-10-CM | POA: Insufficient documentation

## 2018-10-16 ENCOUNTER — Ambulatory Visit: Payer: Self-pay | Admitting: Internal Medicine

## 2018-10-16 NOTE — Telephone Encounter (Signed)
FYI

## 2018-10-16 NOTE — Telephone Encounter (Signed)
Pt called in c/o "It feels like something is sitting on my chest" " I'm very short of breath"   I think I may have pneumonia.  Her mother passed away on 2018/10/26 and she is having a lot of emotional distress too.   Went to hospital in [place] the day her mother passed away on 10-26-22.  She did not want to go back to the ED due to bills from her ED visit in [place].  I called Dr. Ledell Noss office at pt request and spoke with [name].   She also agreed pt should go to the ED.  I let pt know Dr. Jerilee Hoh would not see her in the office with her symptoms, as I had told her before.  Pt agreed to have her husband take her to [facility] ED now.   I sent these notes to Dr. Ledell Noss office.     Reason for Disposition . [1] Chest pain lasts > 5 minutes AND [2] described as crushing, pressure-like, or heavy  Answer Assessment - Initial Assessment Questions 1. LOCATION: "Where does it hurt?"     My mother passed away on 10/26/2022.    Later that day I ended up in the hospital because I could not breath.   First hospital said I had fluid on my lungs.   My heart enzymes were very high.   They sent me to another hospital to see a cardiologist.   It looked like I had a mild heart attack. I'm worried that I may still have fluid on my lungs.   I'm coughing a lot and my chest is tight.    I just got back into town last night. 2. RADIATION: "Does the pain go anywhere else?" (e.g., into neck, jaw, arms, back)     I feel discomfort in my back.   I feel like something is sitting on my chest.  They started me on a BP medication.    3. ONSET: "When did the chest pain begin?" (Minutes, hours or days)      This past Saturday the chest pain started.  I'm under a lot of stress with my mother's passing.   [place]   I called 911 while I was there because I was having chest pain and trouble breathing.   My BP was very high when I got to the hospital.   This happened on 10/26/2018 4.  PATTERN "Does the pain come and go, or has it been constant since it started?"  "Does it get worse with exertion?"      It keeps coming back the pain.    I coughed a lot on the way home yesterday.   I'm short of breath even when I'm sitting. I came home via car. 5. DURATION: "How long does it last" (e.g., seconds, minutes, hours)     Uncomfortable since 10-26-2018. 6. SEVERITY: "How bad is the pain?"  (e.g., Scale 1-10; mild, moderate, or severe)    - MILD (1-3): doesn't interfere with normal activities     - MODERATE (4-7): interferes with normal activities or awakens from sleep    - SEVERE (8-10): excruciating pain, unable to do any normal activities       4 on scale.   I feel like I can't get enough air.   I use CPAP at night. 7. 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   See heart information above. 8. PULMONARY RISK FACTORS: "Do you have any history of lung disease?"  (e.g., blood clots in lung, asthma, emphysema, birth control pills)     COPD   No O2 9. CAUSE: "What do you think is causing the chest pain?"     My mother passing away on October 08, 2018 10. OTHER SYMPTOMS: "Do you have any other symptoms?" (e.g., dizziness, nausea, vomiting, sweating, fever, difficulty breathing, cough)       I haven't been to the doctor in a long time.   Just coughing and difficulty breathing.   I'm wondering if I have pneumonia.   That's what my mother had when she passed away.  11. PREGNANCY: "Is there any chance you are pregnant?" "When was your last menstrual period?"       N/A due to age  Protocols used: CHEST PAIN-A-AH

## 2018-11-05 ENCOUNTER — Other Ambulatory Visit: Payer: Self-pay | Admitting: Internal Medicine

## 2018-11-05 DIAGNOSIS — E559 Vitamin D deficiency, unspecified: Secondary | ICD-10-CM

## 2018-11-14 ENCOUNTER — Other Ambulatory Visit: Payer: Self-pay

## 2018-11-14 ENCOUNTER — Other Ambulatory Visit: Payer: Self-pay | Admitting: Internal Medicine

## 2018-11-14 ENCOUNTER — Telehealth: Payer: Self-pay | Admitting: Internal Medicine

## 2018-11-14 ENCOUNTER — Other Ambulatory Visit (INDEPENDENT_AMBULATORY_CARE_PROVIDER_SITE_OTHER): Payer: Self-pay

## 2018-11-14 DIAGNOSIS — E039 Hypothyroidism, unspecified: Secondary | ICD-10-CM

## 2018-11-14 DIAGNOSIS — E559 Vitamin D deficiency, unspecified: Secondary | ICD-10-CM

## 2018-11-14 LAB — VITAMIN D 25 HYDROXY (VIT D DEFICIENCY, FRACTURES): VITD: 35 ng/mL (ref 30.00–100.00)

## 2018-11-14 LAB — TSH: TSH: 5.51 u[IU]/mL — ABNORMAL HIGH (ref 0.35–4.50)

## 2018-11-14 MED ORDER — VITAMIN D (ERGOCALCIFEROL) 1.25 MG (50000 UNIT) PO CAPS: 50000.0000 [IU] | ORAL_CAPSULE | ORAL | 0 refills | Status: AC

## 2018-11-14 NOTE — Telephone Encounter (Signed)
Pt says that she was put on Atorvastatin 40MG   1 Tablet at bed time. Pt says that she need a refill on medication and has been requesting through her pharmacy    Pharmacy:   [id number] - [place], [place] [phone] (Phone) [phone] (Fax)

## 2018-11-18 ENCOUNTER — Other Ambulatory Visit: Payer: Self-pay

## 2018-11-18 ENCOUNTER — Ambulatory Visit (INDEPENDENT_AMBULATORY_CARE_PROVIDER_SITE_OTHER): Payer: Self-pay | Admitting: Internal Medicine

## 2018-11-18 ENCOUNTER — Encounter: Payer: Self-pay | Admitting: Internal Medicine

## 2018-11-18 VITALS — BP 150/90 | HR 69 | Temp 98.4°F | Ht 67.0 in | Wt 237.6 lb

## 2018-11-18 DIAGNOSIS — F3342 Major depressive disorder, recurrent, in full remission: Secondary | ICD-10-CM

## 2018-11-18 DIAGNOSIS — E559 Vitamin D deficiency, unspecified: Secondary | ICD-10-CM

## 2018-11-18 DIAGNOSIS — E785 Hyperlipidemia, unspecified: Secondary | ICD-10-CM | POA: Insufficient documentation

## 2018-11-18 DIAGNOSIS — E039 Hypothyroidism, unspecified: Secondary | ICD-10-CM

## 2018-11-18 DIAGNOSIS — R030 Elevated blood-pressure reading, without diagnosis of hypertension: Secondary | ICD-10-CM

## 2018-11-18 MED ORDER — ATORVASTATIN CALCIUM 40 MG PO TABS: 40.0000 mg | ORAL_TABLET | Freq: Every day | ORAL | 1 refills | Status: DC

## 2018-11-18 NOTE — Patient Instructions (Signed)
-  Nice seeing you today!!  -Make sure you take your levothyroxine as soon as you wake up 30 mins before any other medications, food or drink.  -Return in 6 weeks for follow up thyroid levels.  -Schedule f/u with me in 3 months.  -Schedule counseling sessions. Dennison Bulla is available in our office.

## 2018-11-18 NOTE — Telephone Encounter (Signed)
Refill filled at office visit 11/18/2018

## 2018-11-18 NOTE — Progress Notes (Signed)
Established Patient Office Visit     CC/Reason for Visit: Follow-up depression  HPI: Erika [name] is a 63 y.o. female who is coming in today for the above mentioned reasons. Past Medical History is significant for: Mild asthma not on medications, sleep apnea on nightly CPAP, hypothyroidism that has not been well controlled, depression that has not been well controlled and B12 deficiency who just recently restarted her monthly injections.  Her mother passed away on November 02, 2022, it appears she had some severe anxiety following that and was briefly hospitalized for chest pain rule out.  She was found to be hyperlipidemic and was started on Lipitor 40 mg of which she needs a refill today.  Her cardiologist recommended that she taper off her Celexa dose: she was on 80 mg and was told to taper down to 20.  She has noted that with the recent passing of her mother and the tapering off Celexa dose she has been having increased depressive episodes, severe sad mood and difficulty sleeping  She has also been hypothyroid.  She is supposed to be on levothyroxine 50 mcg daily, however she admits to missing at least half of the doses, she also is not taking it correctly.   Past Medical/Surgical History: Past Medical History:  Diagnosis Date  . Anxiety   . Arthritis   . History of cardiac murmur as a child   . Menopausal syndrome   . Mild asthma    no inhalers since stopped smoking  . Neuroma of foot   . OSA on CPAP   . Wears glasses     Past Surgical History:  Procedure Laterality Date  . ELBOW SURGERY Right 2002   repair tendon and nerve injury  . EXCISION MORTON'S NEUROMA Left 2010  . ROBOTIC ASSISTED TOTAL HYSTERECTOMY WITH BILATERAL SALPINGO OOPHERECTOMY N/A 12/31/2017   Procedure: XI ROBOTIC ASSISTED TOTAL HYSTERECTOMY WITH BILATERAL SALPINGO OOPHORECTOMY;  Surgeon: Everitt Amber, MD;  Location: [facility];  Service: Gynecology;  Laterality: N/A;  . SHOULDER ARTHROSCOPY W/ ROTATOR CUFF REPAIR  Left 09/2016   and burectomy    Social History:  reports that she quit smoking about 18 years ago. Her smoking use included cigarettes. She has a 30.00 pack-year smoking history. She has never used smokeless tobacco. She reports that she does not drink alcohol or use drugs.  Allergies: Allergies  Allergen Reactions  . Tetracycline Hcl Rash    Family History:  Family History  Problem Relation Age of Onset  . Diabetes Mother   . Heart disease Mother   . Diabetes Maternal Grandmother   . Diabetes Maternal Grandfather   . Asthma Sister   . Cancer Paternal Grandmother   . Cancer Maternal Uncle   . Uterine cancer Maternal Uncle   . Cancer Maternal Aunt   . Cancer Brother      Current Outpatient Medications:  .  Acetaminophen (TYLENOL ARTHRITIS PAIN PO), Take by mouth., Disp: , Rfl:  .  atorvastatin (LIPITOR) 40 MG tablet, Take 1 tablet (40 mg total) by mouth daily., Disp: 90 tablet, Rfl: 1 .  citalopram (CELEXA) 40 MG tablet, Take 2 tablets (80 mg total) by mouth at bedtime., Disp: 180 tablet, Rfl: 0 .  cyanocobalamin (,VITAMIN B-12,) 1000 MCG/ML injection, Inject 1 mL (1,000 mcg total) into the muscle every 30 (thirty) days., Disp: 10 mL, Rfl: 4 .  ibuprofen (ADVIL,MOTRIN) 200 MG tablet, Take 600 mg by mouth every 6 (six) hours as needed for moderate pain., Disp: ,  Rfl:  .  levothyroxine (SYNTHROID) 50 MCG tablet, Take 0.5 tablets (25 mcg total) by mouth daily before breakfast., Disp: 30 tablet, Rfl: 2 .  SYRINGE/NEEDLE, DISP, 1 ML (B-D SYRINGE/NEEDLE 1CC/25GX5/8) 25G X 5/8" 1 ML MISC, Inject 1 each into the muscle every 30 (thirty) days., Disp: 100 each, Rfl: 0 .  Vitamin D, Ergocalciferol, (DRISDOL) 1.25 MG (50000 UT) CAPS capsule, Take 1 capsule (50,000 Units total) by mouth every 7 (seven) days for 12 doses., Disp: 12 capsule, Rfl: 0  Review of Systems:  Constitutional: Denies fever, chills, diaphoresis, appetite change and fatigue.  HEENT: Denies photophobia, eye pain,  redness, hearing loss, ear pain, congestion, sore throat, rhinorrhea, sneezing, mouth sores, trouble swallowing, neck pain, neck stiffness and tinnitus.   Respiratory: Denies SOB, DOE, cough, chest tightness,  and wheezing.   Cardiovascular: Denies chest pain, palpitations and leg swelling.  Gastrointestinal: Denies nausea, vomiting, abdominal pain, diarrhea, constipation, blood in stool and abdominal distention.  Genitourinary: Denies dysuria, urgency, frequency, hematuria, flank pain and difficulty urinating.  Endocrine: Denies: hot or cold intolerance, sweats, changes in hair or nails, polyuria, polydipsia. Musculoskeletal: Denies myalgias, back pain, joint swelling, arthralgias and gait problem.  Skin: Denies pallor, rash and wound.  Neurological: Denies dizziness, seizures, syncope, weakness, light-headedness, numbness and headaches.  Hematological: Denies adenopathy. Easy bruising, personal or family bleeding history  Psychiatric/Behavioral: Denies suicidal ideation, confusion, nervousness and agitation    Physical Exam: Vitals:   11/18/18 0815  BP: (!) 150/90  Pulse: 69  Temp: 98.4 F (36.9 C)  TempSrc: Temporal  SpO2: 95%  Weight: 237 lb 9.6 oz (107.8 kg)  Height: 5\' 7"  (1.702 m)    Body mass index is 37.21 kg/m.   Constitutional: NAD, calm, comfortable Eyes: PERRL, lids and conjunctivae normal, wears corrective lenses ENMT: Mucous membranes are moist.  Respiratory: clear to auscultation bilaterally, no wheezing, no crackles. Normal respiratory effort. No accessory muscle use.  Cardiovascular: Regular rate and rhythm, no murmurs / rubs / gallops. No extremity edema. 2+ pedal pulses. No carotid bruits.  Abdomen: no tenderness, no masses palpated. No hepatosplenomegaly. Bowel sounds positive.  Musculoskeletal: no clubbing / cyanosis. No joint deformity upper and lower extremities. Good ROM, no contractures. Normal muscle tone.  Neurologic grossly intact and nonfocal   psychiatric: Normal judgment and insight. Alert and oriented x 3. Normal mood.    Impression and Plan:  DEPRESSIVE DISORDER -Multiple reasons for increased depressed mood including recent passing of her mother, tapering off Celexa dose, vitamin B12 deficiency, and hypothyroidism that is not optimally controlled.    Office Visit from 11/18/2018 in [place] at [place]  PHQ-9 Total Score  9     -She has scored high on depression testing today. -I have advised her to not taper Celexa down to 20 and keep at 40 for now, she will schedule CBT sessions with Mr. Dennison Bulla in our office, we will also optimize her thyroid, she is currently receiving monthly B12 injections in the office.  Hypothyroidism, unspecified type -She has been instructed on correct way to take Synthroid, has been encouraged to be more consistent and to take it on a daily basis. -She will return in 6 weeks for follow-up TSH.  Vitamin D deficiency -She is currently completing her 12 weeks of high-dose vitamin D.  Hyperlipidemia, unspecified hyperlipidemia type -Last measured LDL was 144 in May 2020. -Will refill Lipitor 40 today.  Elevated blood pressure without diagnosis of hypertension -Suspect blood pressure may be elevated due to  her current mood issues. -Will put a watch on this, will continue to recheck at follow-up visits.   Patient Instructions  -Nice seeing you today!!  -Make sure you take your levothyroxine as soon as you wake up 30 mins before any other medications, food or drink.  -Return in 6 weeks for follow up thyroid levels.  -Schedule f/u with me in 3 months.  -Schedule counseling sessions. Dennison Bulla is available in our office.       Lelon Frohlich, MD [facility]

## 2018-12-04 ENCOUNTER — Other Ambulatory Visit: Payer: Self-pay | Admitting: Internal Medicine

## 2018-12-04 DIAGNOSIS — F3342 Major depressive disorder, recurrent, in full remission: Secondary | ICD-10-CM

## 2018-12-04 MED ORDER — CITALOPRAM HYDROBROMIDE 40 MG PO TABS: 80.0000 mg | ORAL_TABLET | Freq: Every day | ORAL | 0 refills | Status: DC

## 2018-12-04 NOTE — Telephone Encounter (Signed)
Medication Refill - Medication: citalopram (CELEXA) 40 MG tablet  Has the patient contacted their pharmacy? Yes - not at the pharmacy.  Pt states that she used to take two pills but at last OV she was told to only take one a day. (Agent: If no, request that the patient contact the pharmacy for the refill.) (Agent: If yes, when and what did the pharmacy advise?)  Preferred Pharmacy (with phone number or street name):  [id number] - [place], [place] [id number] (Phone) [phone] (Fax)     Agent: Please be advised that RX refills may take up to 3 business days. We ask that you follow-up with your pharmacy.

## 2018-12-30 ENCOUNTER — Other Ambulatory Visit: Payer: Self-pay

## 2018-12-30 ENCOUNTER — Other Ambulatory Visit (INDEPENDENT_AMBULATORY_CARE_PROVIDER_SITE_OTHER): Payer: Self-pay

## 2018-12-30 DIAGNOSIS — E039 Hypothyroidism, unspecified: Secondary | ICD-10-CM

## 2018-12-30 LAB — TSH: TSH: 5.05 u[IU]/mL — ABNORMAL HIGH (ref 0.35–4.50)

## 2019-01-01 ENCOUNTER — Other Ambulatory Visit: Payer: Self-pay | Admitting: Internal Medicine

## 2019-01-01 DIAGNOSIS — E039 Hypothyroidism, unspecified: Secondary | ICD-10-CM

## 2019-01-01 MED ORDER — LEVOTHYROXINE SODIUM 50 MCG PO TABS: 50.0000 ug | ORAL_TABLET | Freq: Every day | ORAL | 1 refills | Status: DC

## 2019-01-02 ENCOUNTER — Other Ambulatory Visit: Payer: Self-pay | Admitting: Internal Medicine

## 2019-01-10 ENCOUNTER — Other Ambulatory Visit: Payer: Self-pay | Admitting: Internal Medicine

## 2019-01-10 DIAGNOSIS — E039 Hypothyroidism, unspecified: Secondary | ICD-10-CM

## 2019-02-18 ENCOUNTER — Other Ambulatory Visit: Payer: Self-pay | Admitting: Internal Medicine

## 2019-02-18 ENCOUNTER — Encounter: Payer: Self-pay | Admitting: Internal Medicine

## 2019-02-18 ENCOUNTER — Ambulatory Visit (INDEPENDENT_AMBULATORY_CARE_PROVIDER_SITE_OTHER): Payer: Self-pay | Admitting: Internal Medicine

## 2019-02-18 ENCOUNTER — Other Ambulatory Visit: Payer: Self-pay

## 2019-02-18 VITALS — BP 140/80 | HR 73 | Temp 97.2°F | Wt 235.7 lb

## 2019-02-18 DIAGNOSIS — I10 Essential (primary) hypertension: Secondary | ICD-10-CM

## 2019-02-18 DIAGNOSIS — E559 Vitamin D deficiency, unspecified: Secondary | ICD-10-CM

## 2019-02-18 DIAGNOSIS — E039 Hypothyroidism, unspecified: Secondary | ICD-10-CM

## 2019-02-18 DIAGNOSIS — E538 Deficiency of other specified B group vitamins: Secondary | ICD-10-CM

## 2019-02-18 DIAGNOSIS — F3342 Major depressive disorder, recurrent, in full remission: Secondary | ICD-10-CM

## 2019-02-18 LAB — VITAMIN D 25 HYDROXY (VIT D DEFICIENCY, FRACTURES): VITD: 42.19 ng/mL (ref 30.00–100.00)

## 2019-02-18 LAB — TSH: TSH: 4.55 u[IU]/mL — ABNORMAL HIGH (ref 0.35–4.50)

## 2019-02-18 MED ORDER — CYANOCOBALAMIN 1000 MCG/ML IJ SOLN: 1000.0000 ug | INTRAMUSCULAR | 2 refills | Status: DC

## 2019-02-18 MED ORDER — "BD SYRINGE/NEEDLE 25G X 5/8"" 1 ML MISC": 1.0000 | 0 refills | Status: DC

## 2019-02-18 MED ORDER — LEVOTHYROXINE SODIUM 75 MCG PO TABS: 75.0000 ug | ORAL_TABLET | Freq: Every day | ORAL | 1 refills | Status: DC

## 2019-02-18 MED ORDER — HYDROCHLOROTHIAZIDE 25 MG PO TABS: 25.0000 mg | ORAL_TABLET | Freq: Every day | ORAL | 1 refills | Status: DC

## 2019-02-18 NOTE — Progress Notes (Signed)
Established Patient Office Visit     CC/Reason for Visit: Follow-up chronic conditions  HPI: Erika [name] is a 63 y.o. female who is coming in today for the above mentioned reasons. Past Medical History is significant for: Mild asthma not on medications, sleep apnea on nightly CPAP, hypothyroidism that has not been well controlled, depression that has not been well controlled, vitamin D deficiency and B12 deficiency who just recently restarted her monthly injections.  She is due to have her vitamin D and her TSH levels checked today.  In regards to her depression she remains on Celexa 40 mg daily, has yet to schedule CBT sessions but feels like she feels improved.  She notes that her blood pressure at home remains high, systolic 1 [id number] on average, this is consistent with in office blood pressures in the last few visits.   Past Medical/Surgical History: Past Medical History:  Diagnosis Date  . Anxiety   . Arthritis   . History of cardiac murmur as a child   . Menopausal syndrome   . Mild asthma    no inhalers since stopped smoking  . Neuroma of foot   . OSA on CPAP   . Wears glasses     Past Surgical History:  Procedure Laterality Date  . ELBOW SURGERY Right 2002   repair tendon and nerve injury  . EXCISION MORTON'S NEUROMA Left 2010  . ROBOTIC ASSISTED TOTAL HYSTERECTOMY WITH BILATERAL SALPINGO OOPHERECTOMY N/A 12/31/2017   Procedure: XI ROBOTIC ASSISTED TOTAL HYSTERECTOMY WITH BILATERAL SALPINGO OOPHORECTOMY;  Surgeon: Everitt Amber, MD;  Location: [facility];  Service: Gynecology;  Laterality: N/A;  . SHOULDER ARTHROSCOPY W/ ROTATOR CUFF REPAIR Left 09/2016   and burectomy    Social History:  reports that she quit smoking about 18 years ago. Her smoking use included cigarettes. She has a 30.00 pack-year smoking history. She has never used smokeless tobacco. She reports that she does not drink alcohol or use drugs.  Allergies: Allergies  Allergen Reactions  .  Tetracycline Hcl Rash    Family History:  Family History  Problem Relation Age of Onset  . Diabetes Mother   . Heart disease Mother   . Diabetes Maternal Grandmother   . Diabetes Maternal Grandfather   . Asthma Sister   . Cancer Paternal Grandmother   . Cancer Maternal Uncle   . Uterine cancer Maternal Uncle   . Cancer Maternal Aunt   . Cancer Brother      Current Outpatient Medications:  .  Acetaminophen (TYLENOL ARTHRITIS PAIN PO), Take by mouth., Disp: , Rfl:  .  atorvastatin (LIPITOR) 40 MG tablet, Take 1 tablet (40 mg total) by mouth daily., Disp: 90 tablet, Rfl: 1 .  citalopram (CELEXA) 40 MG tablet, Take 2 tablets (80 mg total) by mouth at bedtime., Disp: 180 tablet, Rfl: 0 .  cyanocobalamin (,VITAMIN B-12,) 1000 MCG/ML injection, Inject 1 mL (1,000 mcg total) into the muscle every 30 (thirty) days., Disp: 10 mL, Rfl: 4 .  ibuprofen (ADVIL,MOTRIN) 200 MG tablet, Take 600 mg by mouth every 6 (six) hours as needed for moderate pain., Disp: , Rfl:  .  levothyroxine (SYNTHROID) 50 MCG tablet, Take 1 tablet (50 mcg total) by mouth daily before breakfast., Disp: 90 tablet, Rfl: 1 .  SYRINGE/NEEDLE, DISP, 1 ML (B-D SYRINGE/NEEDLE 1CC/25GX5/8) 25G X 5/8" 1 ML MISC, Inject 1 each into the muscle every 30 (thirty) days., Disp: 100 each, Rfl: 0 .  hydrochlorothiazide (HYDRODIURIL) 25 MG tablet,  Take 1 tablet (25 mg total) by mouth daily., Disp: 90 tablet, Rfl: 1  Review of Systems:  Constitutional: Denies fever, chills, diaphoresis, appetite change and fatigue.  HEENT: Denies photophobia, eye pain, redness, hearing loss, ear pain, congestion, sore throat, rhinorrhea, sneezing, mouth sores, trouble swallowing, neck pain, neck stiffness and tinnitus.   Respiratory: Denies SOB, DOE, cough, chest tightness,  and wheezing.   Cardiovascular: Denies chest pain, palpitations and leg swelling.  Gastrointestinal: Denies nausea, vomiting, abdominal pain, diarrhea, constipation, blood in stool and  abdominal distention.  Genitourinary: Denies dysuria, urgency, frequency, hematuria, flank pain and difficulty urinating.  Endocrine: Denies: hot or cold intolerance, sweats, changes in hair or nails, polyuria, polydipsia. Musculoskeletal: Denies myalgias, back pain, joint swelling, arthralgias and gait problem.  Skin: Denies pallor, rash and wound.  Neurological: Denies dizziness, seizures, syncope, weakness, light-headedness, numbness and headaches.  Hematological: Denies adenopathy. Easy bruising, personal or family bleeding history  Psychiatric/Behavioral: Denies suicidal ideation, mood changes, confusion, nervousness, sleep disturbance and agitation    Physical Exam: Vitals:   02/18/19 0828  BP: 140/80  Pulse: 73  Temp: (!) 97.2 F (36.2 C)  TempSrc: Temporal  SpO2: 98%  Weight: 235 lb 11.2 oz (106.9 kg)    Body mass index is 36.92 kg/m.   Constitutional: NAD, calm, comfortable Eyes: PERRL, lids and conjunctivae normal, wears corrective lenses ENMT: Mucous membranes are moist.  Respiratory: clear to auscultation bilaterally, no wheezing, no crackles. Normal respiratory effort. No accessory muscle use.  Cardiovascular: Regular rate and rhythm, no murmurs / rubs / gallops. No extremity edema. 2+ pedal pulses. .  Abdomen: no tenderness, no masses palpated. No hepatosplenomegaly. Bowel sounds positive.  Musculoskeletal: no clubbing / cyanosis. No joint deformity upper and lower extremities. Good ROM, no contractures. Normal muscle tone.  Skin: no rashes, lesions, ulcers. No induration Neurologic: Grossly intact and nonfocal Psychiatric: Normal judgment and insight. Alert and oriented x 3. Normal mood.    Impression and Plan:  Vitamin D deficiency -Recheck levels today.  Hypothyroidism, unspecified type  -Check TSH, for now continue Synthroid 50 mcg.  B12 deficiency -She is doing monthly B12 injections at home, is requesting refills of supplies.  DEPRESSIVE DSORDER  -She feels improved, continue Celexa 40 mg, has been advised to schedule follow-up for CBT sessions as we had previously discussed.  Essential hypertension  -Given elevated home blood pressures and blood pressures the last few office visits, I feel comfortable making this diagnosis. -She will start hydrochlorothiazide 25 mg daily and will return in 6 weeks for blood pressure follow-up as well as basic metabolic profile to follow renal function and electrolytes.   Patient Instructions  -Nice seeing you today!!  -Lab work today; will notify you once results are available.  -Start HCTZ 25 mg daily.  -Check blood pressure at home 2-3 times a week and bring those numbers in to your next visit.  -Schedule follow up in 8 weeks.     Lelon Frohlich, MD [facility] at [facility]

## 2019-02-18 NOTE — Patient Instructions (Signed)
-  Nice seeing you today!!  -Lab work today; will notify you once results are available.  -Start HCTZ 25 mg daily.  -Check blood pressure at home 2-3 times a week and bring those numbers in to your next visit.  -Schedule follow up in 8 weeks.

## 2019-02-18 NOTE — Addendum Note (Signed)
Addended by: Westley Hummer B on: 02/18/2019 09:01 AM   Modules accepted: Orders

## 2019-02-20 ENCOUNTER — Other Ambulatory Visit: Payer: Self-pay | Admitting: Internal Medicine

## 2019-02-20 DIAGNOSIS — F3342 Major depressive disorder, recurrent, in full remission: Secondary | ICD-10-CM

## 2019-03-31 ENCOUNTER — Other Ambulatory Visit: Payer: Self-pay

## 2019-04-01 ENCOUNTER — Ambulatory Visit (INDEPENDENT_AMBULATORY_CARE_PROVIDER_SITE_OTHER): Payer: Self-pay | Admitting: Internal Medicine

## 2019-04-01 ENCOUNTER — Encounter: Payer: Self-pay | Admitting: Internal Medicine

## 2019-04-01 VITALS — BP 130/80 | HR 78 | Temp 97.9°F | Wt 236.1 lb

## 2019-04-01 DIAGNOSIS — E559 Vitamin D deficiency, unspecified: Secondary | ICD-10-CM

## 2019-04-01 DIAGNOSIS — F411 Generalized anxiety disorder: Secondary | ICD-10-CM

## 2019-04-01 DIAGNOSIS — F339 Major depressive disorder, recurrent, unspecified: Secondary | ICD-10-CM

## 2019-04-01 DIAGNOSIS — E039 Hypothyroidism, unspecified: Secondary | ICD-10-CM

## 2019-04-01 DIAGNOSIS — E538 Deficiency of other specified B group vitamins: Secondary | ICD-10-CM

## 2019-04-01 LAB — TSH: TSH: 1.21 u[IU]/mL (ref 0.35–4.50)

## 2019-04-01 MED ORDER — BUPROPION HCL ER (XL) 150 MG PO TB24: 150.0000 mg | ORAL_TABLET | Freq: Every day | ORAL | 0 refills | Status: DC

## 2019-04-01 MED ORDER — CYANOCOBALAMIN 1000 MCG/ML IJ SOLN: 1000.0000 ug | INTRAMUSCULAR | 2 refills | Status: DC

## 2019-04-01 NOTE — Progress Notes (Signed)
Established Patient Office Visit     This visit occurred during the SARS-CoV-2 public health emergency.  Safety protocols were in place, including screening questions prior to the visit, additional usage of staff PPE, and extensive cleaning of exam room while observing appropriate contact time as indicated for disinfecting solutions.    CC/Reason for Visit: Discussed depressive symptoms  HPI: Erika [name] is a 63 y.o. female who is coming in today for the above mentioned reasons. Past Medical History is significant for: Mild asthma not on medications, sleep apnea on nightly CPAP, hypothyroidism that has not been well controlled, depression that has not been well controlled, vitamin D deficiency and B12 deficiency who just recently restarted her monthly injections. She feels like her depression and anxiety have worsened in the last month due to not being able to see family over the holidays due to the COVID-19 pandemic and its constraints. She is having extreme amounts of sadness, tearfulness and other anxiety symptoms. She has not yet been able to schedule CBT sessions. Her TSH was high in early November and we increased her Synthroid from 50 to 75 mcg, she is due for TSH check today. She is receiving her monthly vitamin B12 injections, last was on December 1. She was also recently diagnosed with hypertension and started on hydrochlorothiazide which is working well for her. Her asthma has not flared up yet this season.   Past Medical/Surgical History: Past Medical History:  Diagnosis Date  . Anxiety   . Arthritis   . History of cardiac murmur as a child   . Menopausal syndrome   . Mild asthma    no inhalers since stopped smoking  . Neuroma of foot   . OSA on CPAP   . Wears glasses     Past Surgical History:  Procedure Laterality Date  . ELBOW SURGERY Right 2002   repair tendon and nerve injury  . EXCISION MORTON'S NEUROMA Left 2010  . ROBOTIC ASSISTED TOTAL HYSTERECTOMY WITH  BILATERAL SALPINGO OOPHERECTOMY N/A 12/31/2017   Procedure: XI ROBOTIC ASSISTED TOTAL HYSTERECTOMY WITH BILATERAL SALPINGO OOPHORECTOMY;  Surgeon: Everitt Amber, MD;  Location: [facility];  Service: Gynecology;  Laterality: N/A;  . SHOULDER ARTHROSCOPY W/ ROTATOR CUFF REPAIR Left 09/2016   and burectomy    Social History:  reports that she quit smoking about 18 years ago. Her smoking use included cigarettes. She has a 30.00 pack-year smoking history. She has never used smokeless tobacco. She reports that she does not drink alcohol or use drugs.  Allergies: Allergies  Allergen Reactions  . Tetracycline Hcl Rash    Family History:  Family History  Problem Relation Age of Onset  . Diabetes Mother   . Heart disease Mother   . Diabetes Maternal Grandmother   . Diabetes Maternal Grandfather   . Asthma Sister   . Cancer Paternal Grandmother   . Cancer Maternal Uncle   . Uterine cancer Maternal Uncle   . Cancer Maternal Aunt   . Cancer Brother      Current Outpatient Medications:  .  Acetaminophen (TYLENOL ARTHRITIS PAIN PO), Take by mouth., Disp: , Rfl:  .  atorvastatin (LIPITOR) 40 MG tablet, Take 1 tablet (40 mg total) by mouth daily., Disp: 90 tablet, Rfl: 1 .  citalopram (CELEXA) 40 MG tablet, TAKE 2 TABLETS BY MOUTH AT BEDTIME., Disp: 60 tablet, Rfl: 2 .  cyanocobalamin (,VITAMIN B-12,) 1000 MCG/ML injection, Inject 1 mL (1,000 mcg total) into the muscle every 30 (thirty) days.,  Disp: 18 mL, Rfl: 2 .  hydrochlorothiazide (HYDRODIURIL) 25 MG tablet, Take 1 tablet (25 mg total) by mouth daily., Disp: 90 tablet, Rfl: 1 .  ibuprofen (ADVIL,MOTRIN) 200 MG tablet, Take 600 mg by mouth every 6 (six) hours as needed for moderate pain., Disp: , Rfl:  .  levothyroxine (SYNTHROID) 75 MCG tablet, Take 1 tablet (75 mcg total) by mouth daily before breakfast., Disp: 90 tablet, Rfl: 1 .  SYRINGE/NEEDLE, DISP, 1 ML (B-D SYRINGE/NEEDLE 1CC/25GX5/8) 25G X 5/8" 1 ML MISC, Inject 1 each into the muscle  every 30 (thirty) days., Disp: 100 each, Rfl: 0 .  buPROPion (WELLBUTRIN XL) 150 MG 24 hr tablet, Take 1 tablet (150 mg total) by mouth daily., Disp: 90 tablet, Rfl: 0  Review of Systems:  Constitutional: Denies fever, chills, diaphoresis, appetite change and fatigue.  HEENT: Denies photophobia, eye pain, redness, hearing loss, ear pain, congestion, sore throat, rhinorrhea, sneezing, mouth sores, trouble swallowing, neck pain, neck stiffness and tinnitus.   Respiratory: Denies SOB, DOE, cough, chest tightness,  and wheezing.   Cardiovascular: Denies chest pain, palpitations and leg swelling.  Gastrointestinal: Denies nausea, vomiting, abdominal pain, diarrhea, constipation, blood in stool and abdominal distention.  Genitourinary: Denies dysuria, urgency, frequency, hematuria, flank pain and difficulty urinating.  Endocrine: Denies: hot or cold intolerance, sweats, changes in hair or nails, polyuria, polydipsia. Musculoskeletal: Denies myalgias, back pain, joint swelling, arthralgias and gait problem.  Skin: Denies pallor, rash and wound.  Neurological: Denies dizziness, seizures, syncope, weakness, light-headedness, numbness and headaches.  Hematological: Denies adenopathy. Easy bruising, personal or family bleeding history  Psychiatric/Behavioral: Denies suicidal ideation, positive for mood changes,  nervousness, sleep disturbance. Denies confusion and agitation   Physical Exam: Vitals:   04/01/19 0836  BP: 130/80  Pulse: 78  Temp: 97.9 F (36.6 C)  TempSrc: Temporal  SpO2: 98%  Weight: 236 lb 1.6 oz (107.1 kg)    Body mass index is 36.98 kg/m.   Constitutional: NAD, calm, comfortable Eyes: PERRL, lids and conjunctivae normal, wears corrective lenses ENMT: Mucous membranes are moist.  Respiratory: clear to auscultation bilaterally, no wheezing, no crackles. Normal respiratory effort. No accessory muscle use.  Cardiovascular: Regular rate and rhythm, no murmurs / rubs / gallops.  No extremity edema. 2+ pedal pulses. No carotid bruits.  Abdomen: no tenderness, no masses palpated. No hepatosplenomegaly. Bowel sounds positive.  Musculoskeletal: no clubbing / cyanosis. No joint deformity upper and lower extremities. Good ROM, no contractures. Normal muscle tone.  Neurologic: Grossly intact and nonfocal Psychiatric: Normal judgment and insight. Alert and oriented x 3. Mood is sad and tearful.   Impression and Plan:  Hypothyroidism, unspecified type  -Continue Synthroid 75 mcg. -Recheck TSH today, he has been 5 weeks since her last dose increment.  B12 deficiency -On monthly B12 supplementation.  Vitamin D deficiency -Have advised at least 2000 IUs of vitamin D daily.  Depression, recurrent Anxiety state     Office Visit from 04/01/2019 in [facility]  PHQ-9 Total Score  8       -Continue Celexa 40 mg. -She she is having a difficult time will add Wellbutrin 150 mg to hopefully help her get through the holiday season. -We will have her come back in 2 to 3 months to see if we may be able to wean her off the Wellbutrin.      Patient Instructions  -Nice seeing you today!!  -Lab work today; will notify you once results are available.  -Start Wellbutrin 150  mg daily.  -Schedule follow up in 2 months.  -Please try and arrange therapy sessions with Mr. Theodis Shove.       Lelon Frohlich, MD [facility] at [facility]

## 2019-04-01 NOTE — Patient Instructions (Signed)
-  Nice seeing you today!!  -Lab work today; will notify you once results are available.  -Start Wellbutrin 150 mg daily.  -Schedule follow up in 2 months.  -Please try and arrange therapy sessions with Mr. Theodis Shove.

## 2019-04-01 NOTE — Addendum Note (Signed)
Addended by: Isaiah Serge D on: 04/01/2019 09:14 AM   Modules accepted: Orders

## 2019-04-03 ENCOUNTER — Telehealth: Payer: Self-pay | Admitting: Internal Medicine

## 2019-04-03 NOTE — Telephone Encounter (Signed)
Message Routed to PCP CMA 

## 2019-04-03 NOTE — Telephone Encounter (Signed)
:   Patient is calling regarding her current medications and would like a call back from a nurse . Please advise

## 2019-04-07 NOTE — Telephone Encounter (Signed)
Spoke with patient and reviewed Dr Ledell Noss notes.

## 2019-04-07 NOTE — Telephone Encounter (Deleted)
Patient wou

## 2019-05-19 ENCOUNTER — Telehealth: Payer: Self-pay | Admitting: Internal Medicine

## 2019-05-19 MED ORDER — "BD SAFETYGLIDE SYRINGE/NEEDLE 25G X 1"" 3 ML MISC": 11 refills | Status: DC

## 2019-05-19 NOTE — Telephone Encounter (Signed)
Patient needs some needles called in to take her B12 shot.  These needles are being used by pt and husband for their B12 shots.  Husband is Coralyn Mark (DOB: 03/22/1955)//Hernandez pt as well.  Needle size: 24ml 25g x 5/8  Pharmacy- [facility] on [address]

## 2019-05-19 NOTE — Telephone Encounter (Signed)
Refill sent.

## 2019-05-26 ENCOUNTER — Other Ambulatory Visit: Payer: Self-pay | Admitting: Internal Medicine

## 2019-05-26 DIAGNOSIS — E785 Hyperlipidemia, unspecified: Secondary | ICD-10-CM

## 2019-05-26 DIAGNOSIS — F3342 Major depressive disorder, recurrent, in full remission: Secondary | ICD-10-CM

## 2019-05-28 ENCOUNTER — Other Ambulatory Visit: Payer: Self-pay

## 2019-05-29 ENCOUNTER — Ambulatory Visit (INDEPENDENT_AMBULATORY_CARE_PROVIDER_SITE_OTHER): Payer: Self-pay | Admitting: Internal Medicine

## 2019-05-29 ENCOUNTER — Encounter: Payer: Self-pay | Admitting: Internal Medicine

## 2019-05-29 VITALS — BP 110/78 | HR 72 | Temp 98.2°F | Wt 240.9 lb

## 2019-05-29 DIAGNOSIS — E785 Hyperlipidemia, unspecified: Secondary | ICD-10-CM

## 2019-05-29 DIAGNOSIS — F3342 Major depressive disorder, recurrent, in full remission: Secondary | ICD-10-CM

## 2019-05-29 DIAGNOSIS — E039 Hypothyroidism, unspecified: Secondary | ICD-10-CM

## 2019-05-29 DIAGNOSIS — I10 Essential (primary) hypertension: Secondary | ICD-10-CM

## 2019-05-29 NOTE — Progress Notes (Signed)
Established Patient Office Visit     This visit occurred during the SARS-CoV-2 public health emergency.  Safety protocols were in place, including screening questions prior to the visit, additional usage of staff PPE, and extensive cleaning of exam room while observing appropriate contact time as indicated for disinfecting solutions.    CC/Reason for Visit: 53-month follow-up depression  HPI: Erika [name] is a 64 y.o. female who is coming in today for the above mentioned reasons. Past Medical History is significant for: Mild asthma not on medications, obstructive sleep apnea on CPAP, hypothyroidism, vitamin D and vitamin B12 deficiency as well as depression.  At her last visit in mid December we had added Wellbutrin 150 milligrams to her Celexa 40 mg as she was exhibiting increase depression and sadness surrounding the holidays.  She has done well with this combination.  She has a new job.   Past Medical/Surgical History: Past Medical History:  Diagnosis Date  . Anxiety   . Arthritis   . History of cardiac murmur as a child   . Menopausal syndrome   . Mild asthma    no inhalers since stopped smoking  . Neuroma of foot   . OSA on CPAP   . Wears glasses     Past Surgical History:  Procedure Laterality Date  . ELBOW SURGERY Right 2002   repair tendon and nerve injury  . EXCISION MORTON'S NEUROMA Left 2010  . ROBOTIC ASSISTED TOTAL HYSTERECTOMY WITH BILATERAL SALPINGO OOPHERECTOMY N/A 12/31/2017   Procedure: XI ROBOTIC ASSISTED TOTAL HYSTERECTOMY WITH BILATERAL SALPINGO OOPHORECTOMY;  Surgeon: Everitt Amber, MD;  Location: [facility];  Service: Gynecology;  Laterality: N/A;  . SHOULDER ARTHROSCOPY W/ ROTATOR CUFF REPAIR Left 09/2016   and burectomy    Social History:  reports that she quit smoking about 19 years ago. Her smoking use included cigarettes. She has a 30.00 pack-year smoking history. She has never used smokeless tobacco. She reports that she does not drink alcohol or  use drugs.  Allergies: Allergies  Allergen Reactions  . Tetracycline Hcl Rash    Family History:  Family History  Problem Relation Age of Onset  . Diabetes Mother   . Heart disease Mother   . Diabetes Maternal Grandmother   . Diabetes Maternal Grandfather   . Asthma Sister   . Cancer Paternal Grandmother   . Cancer Maternal Uncle   . Uterine cancer Maternal Uncle   . Cancer Maternal Aunt   . Cancer Brother      Current Outpatient Medications:  .  Acetaminophen (TYLENOL ARTHRITIS PAIN PO), Take by mouth., Disp: , Rfl:  .  atorvastatin (LIPITOR) 40 MG tablet, TAKE 1 TABLET BY MOUTH EVERY DAY, Disp: 90 tablet, Rfl: 1 .  buPROPion (WELLBUTRIN XL) 150 MG 24 hr tablet, Take 1 tablet (150 mg total) by mouth daily., Disp: 90 tablet, Rfl: 0 .  Cholecalciferol (VITAMIN D3) 50 MCG (2000 UT) CHEW, Chew by mouth., Disp: , Rfl:  .  citalopram (CELEXA) 40 MG tablet, TAKE 2 TABLETS BY MOUTH AT BEDTIME, Disp: 180 tablet, Rfl: 1 .  cyanocobalamin (,VITAMIN B-12,) 1000 MCG/ML injection, Inject 1 mL (1,000 mcg total) into the muscle every 30 (thirty) days., Disp: 18 mL, Rfl: 2 .  hydrochlorothiazide (HYDRODIURIL) 25 MG tablet, Take 1 tablet (25 mg total) by mouth daily., Disp: 90 tablet, Rfl: 1 .  ibuprofen (ADVIL,MOTRIN) 200 MG tablet, Take 600 mg by mouth every 6 (six) hours as needed for moderate pain., Disp: , Rfl:  .  levothyroxine (SYNTHROID) 75 MCG tablet, Take 1 tablet (75 mcg total) by mouth daily before breakfast., Disp: 90 tablet, Rfl: 1 .  Multiple Vitamins-Minerals (ONE-A-DAY WOMENS PO), Take by mouth., Disp: , Rfl:  .  SYRINGE-NEEDLE, DISP, 3 ML (BD SAFETYGLIDE SYRINGE/NEEDLE) 25G X 1" 3 ML MISC, Use for B12 injections, Disp: 100 each, Rfl: 11  Review of Systems:  Constitutional: Denies fever, chills, diaphoresis, appetite change and fatigue.  HEENT: Denies photophobia, eye pain, redness, hearing loss, ear pain, congestion, sore throat, rhinorrhea, sneezing, mouth sores, trouble  swallowing, neck pain, neck stiffness and tinnitus.   Respiratory: Denies SOB, DOE, cough, chest tightness,  and wheezing.   Cardiovascular: Denies chest pain, palpitations and leg swelling.  Gastrointestinal: Denies nausea, vomiting, abdominal pain, diarrhea, constipation, blood in stool and abdominal distention.  Genitourinary: Denies dysuria, urgency, frequency, hematuria, flank pain and difficulty urinating.  Endocrine: Denies: hot or cold intolerance, sweats, changes in hair or nails, polyuria, polydipsia. Musculoskeletal: Denies myalgias, back pain, joint swelling, arthralgias and gait problem.  Skin: Denies pallor, rash and wound.  Neurological: Denies dizziness, seizures, syncope, weakness, light-headedness, numbness and headaches.  Hematological: Denies adenopathy. Easy bruising, personal or family bleeding history  Psychiatric/Behavioral: Denies suicidal ideation, confusion, nervousness, sleep disturbance and agitation    Physical Exam: Vitals:   05/29/19 0846  BP: 110/78  Pulse: 72  Temp: 98.2 F (36.8 C)  TempSrc: Temporal  SpO2: 98%  Weight: 240 lb 14.4 oz (109.3 kg)    Body mass index is 37.73 kg/m.   Constitutional: NAD, calm, comfortable Eyes: PERRL, lids and conjunctivae normal, wears corrective lenses ENMT: Mucous membranes are moist.  Respiratory: clear to auscultation bilaterally, no wheezing, no crackles. Normal respiratory effort. No accessory muscle use.  Cardiovascular: Regular rate and rhythm, no murmurs / rubs / gallops. No extremity edema.  Neurologic: Grossly intact and nonfocal Psychiatric: Normal judgment and insight. Alert and oriented x 3. Normal mood.    Impression and Plan:  DEPRESSIVE DISORDER -PHQ-9 score is 7 today. -Continue combination Celexa and Wellbutrin for now, can consider titrating if mood continues to improve over the course of the next 3 months.  Essential hypertension -Well-controlled on current regimen  Hyperlipidemia,  unspecified hyperlipidemia type -Last LDL was 144, recheck lipids when she returns for CPE.  Hypothyroidism, unspecified type -Last TSH was well-controlled at 1.210.  Morbid obesity ([place]) -Discussed healthy lifestyle, including increased physical activity and better food choices to promote weight loss. -She has elected to try a low-carb lifestyle, will do a weight check in when she returns in 3 months.    Patient Instructions  -Nice seeing you today!!  -See you back in 3 months for your physical. Please come in fasting that day.     Lelon Frohlich, MD [facility] at [facility]

## 2019-05-29 NOTE — Patient Instructions (Signed)
-  Nice seeing you today!!  -See you back in 3 months for your physical. Please come in fasting that day. 

## 2019-06-02 ENCOUNTER — Ambulatory Visit: Payer: Self-pay | Admitting: Internal Medicine

## 2019-06-12 ENCOUNTER — Telehealth: Payer: Self-pay | Admitting: Internal Medicine

## 2019-06-12 DIAGNOSIS — I10 Essential (primary) hypertension: Secondary | ICD-10-CM

## 2019-06-12 MED ORDER — HYDROCHLOROTHIAZIDE 25 MG PO TABS: 25.0000 mg | ORAL_TABLET | Freq: Every day | ORAL | 1 refills | Status: DC

## 2019-06-12 NOTE — Telephone Encounter (Signed)
Spoke with patient.  Refill sent.

## 2019-06-12 NOTE — Telephone Encounter (Signed)
Pt is out of medication Hydrochlorothiazide but not sure what the medication is for. She said that her blood pressure has been a little high so she wonders if it is her blood pressure medication and needs it refilled. Pt would like a call back to go over this information.

## 2019-06-23 ENCOUNTER — Other Ambulatory Visit: Payer: Self-pay | Admitting: Internal Medicine

## 2019-06-23 DIAGNOSIS — F411 Generalized anxiety disorder: Secondary | ICD-10-CM

## 2019-07-08 ENCOUNTER — Telehealth: Payer: Self-pay | Admitting: Internal Medicine

## 2019-07-08 NOTE — Telephone Encounter (Signed)
No need for testing at this time.

## 2019-07-08 NOTE — Telephone Encounter (Signed)
Patient is aware 

## 2019-07-08 NOTE — Telephone Encounter (Signed)
Pt stated that a coworker's brother has been tested positive yesterday for COVID. She says that her coworker has been vaccinated and she has not been around him but has been to his house taking care of his wife. Pt stated she has had her first inj for the vaccine but wonders if she needs to be tested? She has no symptoms and neither does her coworker.   Pt can be reached at [phone] -ok to leave detailed message per pt

## 2019-08-11 ENCOUNTER — Other Ambulatory Visit: Payer: Self-pay | Admitting: Internal Medicine

## 2019-08-11 DIAGNOSIS — E039 Hypothyroidism, unspecified: Secondary | ICD-10-CM

## 2019-08-20 ENCOUNTER — Encounter: Payer: Self-pay | Admitting: Internal Medicine

## 2019-11-20 ENCOUNTER — Other Ambulatory Visit: Payer: Self-pay | Admitting: Internal Medicine

## 2019-11-20 DIAGNOSIS — F3342 Major depressive disorder, recurrent, in full remission: Secondary | ICD-10-CM

## 2019-11-26 ENCOUNTER — Encounter: Payer: Self-pay | Admitting: Internal Medicine

## 2019-11-26 DIAGNOSIS — Z0289 Encounter for other administrative examinations: Secondary | ICD-10-CM

## 2019-12-14 ENCOUNTER — Other Ambulatory Visit: Payer: Self-pay | Admitting: Internal Medicine

## 2019-12-14 DIAGNOSIS — F411 Generalized anxiety disorder: Secondary | ICD-10-CM

## 2020-02-09 ENCOUNTER — Other Ambulatory Visit: Payer: Self-pay | Admitting: Internal Medicine

## 2020-02-09 DIAGNOSIS — F411 Generalized anxiety disorder: Secondary | ICD-10-CM

## 2020-02-09 DIAGNOSIS — E039 Hypothyroidism, unspecified: Secondary | ICD-10-CM

## 2020-02-09 DIAGNOSIS — I10 Essential (primary) hypertension: Secondary | ICD-10-CM

## 2020-02-17 ENCOUNTER — Encounter (HOSPITAL_COMMUNITY): Payer: Self-pay | Admitting: Emergency Medicine

## 2020-02-17 ENCOUNTER — Other Ambulatory Visit: Payer: Self-pay

## 2020-02-17 ENCOUNTER — Emergency Department (HOSPITAL_COMMUNITY)
Admission: EM | Admit: 2020-02-17 | Discharge: 2020-02-17 | Disposition: A | Discharge status: Home / Self Care | Payer: Medicare Other | Attending: Emergency Medicine | Admitting: Emergency Medicine

## 2020-02-17 ENCOUNTER — Telehealth (INDEPENDENT_AMBULATORY_CARE_PROVIDER_SITE_OTHER): Payer: Medicare Other | Admitting: Internal Medicine

## 2020-02-17 ENCOUNTER — Emergency Department (HOSPITAL_COMMUNITY): Payer: Medicare Other

## 2020-02-17 VITALS — Temp 98.2°F | Wt 235.0 lb

## 2020-02-17 DIAGNOSIS — J3489 Other specified disorders of nose and nasal sinuses: Secondary | ICD-10-CM | POA: Insufficient documentation

## 2020-02-17 DIAGNOSIS — R0602 Shortness of breath: Secondary | ICD-10-CM | POA: Diagnosis present

## 2020-02-17 DIAGNOSIS — Z87891 Personal history of nicotine dependence: Secondary | ICD-10-CM | POA: Diagnosis not present

## 2020-02-17 DIAGNOSIS — R059 Cough, unspecified: Secondary | ICD-10-CM | POA: Diagnosis not present

## 2020-02-17 DIAGNOSIS — M79662 Pain in left lower leg: Secondary | ICD-10-CM | POA: Diagnosis not present

## 2020-02-17 DIAGNOSIS — I10 Essential (primary) hypertension: Secondary | ICD-10-CM | POA: Insufficient documentation

## 2020-02-17 DIAGNOSIS — J45909 Unspecified asthma, uncomplicated: Secondary | ICD-10-CM | POA: Insufficient documentation

## 2020-02-17 DIAGNOSIS — R0789 Other chest pain: Secondary | ICD-10-CM

## 2020-02-17 DIAGNOSIS — R0600 Dyspnea, unspecified: Secondary | ICD-10-CM | POA: Diagnosis not present

## 2020-02-17 DIAGNOSIS — R5383 Other fatigue: Secondary | ICD-10-CM

## 2020-02-17 DIAGNOSIS — Z79899 Other long term (current) drug therapy: Secondary | ICD-10-CM | POA: Insufficient documentation

## 2020-02-17 DIAGNOSIS — R0609 Other forms of dyspnea: Secondary | ICD-10-CM

## 2020-02-17 LAB — BASIC METABOLIC PANEL
Anion gap: 12 (ref 5–15)
BUN: 12 mg/dL (ref 8–23)
CO2: 24 mmol/L (ref 22–32)
Calcium: 9.7 mg/dL (ref 8.9–10.3)
Chloride: 100 mmol/L (ref 98–111)
Creatinine, Ser: 0.92 mg/dL (ref 0.44–1.00)
GFR, Estimated: >60 mL/min (ref >60)
Glucose, Bld: 94 mg/dL (ref 70–99)
Potassium: 3.6 mmol/L (ref 3.5–5.1)
Sodium: 136 mmol/L (ref 135–145)

## 2020-02-17 LAB — CBC
HCT: 42.5 % (ref 36.0–46.0)
Hemoglobin: 13.2 g/dL (ref 12.0–15.0)
MCH: 26.4 pg (ref 26.0–34.0)
MCHC: 31.1 g/dL (ref 30.0–36.0)
MCV: 85 fL (ref 80.0–100.0)
Platelets: 362 10*3/uL (ref 150–400)
RBC: 5 MIL/uL (ref 3.87–5.11)
RDW: 14.8 % (ref 11.5–15.5)
WBC: 6.9 10*3/uL (ref 4.0–10.5)
nRBC: 0 % (ref 0.0–0.2)

## 2020-02-17 LAB — TROPONIN I (HIGH SENSITIVITY)
Troponin I (High Sensitivity): 3 ng/L (ref <18)
Troponin I (High Sensitivity): 4 ng/L (ref <18)

## 2020-02-17 MED ORDER — IOHEXOL 350 MG/ML SOLN
100.0000 mL | Freq: Once | INTRAVENOUS | Status: AC | PRN
  Administered 2020-02-17: 56 mL via INTRAVENOUS

## 2020-02-17 NOTE — ED Provider Notes (Signed)
[facility] EMERGENCY DEPARTMENT Provider Note   CSN: [id number] Arrival date & time: 02/17/20  3664     History Chief Complaint  Patient presents with  . Shortness of Breath    Erika [name] is a 64 y.o. female.  HPI She presents for evaluation of a heavy sensation in her chest present for several weeks, and associated with shortness of breath characterized by dyspnea on exertion for several months.  She uses CPAP for sleep apnea.  She is unsure what the exact diagnosis is causing her sleep apnea.  She is not currently seeing a pulmonologist for it because he has retired.  She denies fever, chills, nausea or vomiting.  She has had some sinus congestion and feels postnasal drip at times.  Sometimes she has to cough and bring up sputum.  The sputum is not discolored.  She does not currently smoke but did in the past.  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  She does not receive ongoing cardiac care.  There are no other known modifying factors.    Past Medical History:  Diagnosis Date  . Anxiety   . Arthritis   . History of cardiac murmur as a child   . Menopausal syndrome   . Mild asthma    no inhalers since stopped smoking  . Neuroma of foot   . OSA on CPAP   . Wears glasses     Patient Active Problem List   Diagnosis Date Noted  . Hypertension 02/18/2019  . Hyperlipidemia 11/18/2018  . Vitamin D deficiency 08/19/2018  . Hypothyroid 08/01/2018  . B12 deficiency 08/01/2018  . Asthma 08/01/2018  . Bilateral ovarian cysts 12/31/2017  . Routine general medical examination at a health care facility 06/14/2014  . Cough 05/04/2013  . Fatigue 01/14/2012  . Skin tag 08/23/2011  . Obstructive sleep apnea 11/30/2008  . Anxiety state 01/15/2008  . 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 BENIGN POSITIONAL VERTIGO 11/21/2006  . ALLERGIC RHINITIS 11/21/2006     Past Surgical History:  Procedure Laterality Date  . ELBOW SURGERY Right 2002   repair tendon and nerve injury  . EXCISION MORTON'S NEUROMA Left 2010  . ROBOTIC ASSISTED TOTAL HYSTERECTOMY WITH BILATERAL SALPINGO OOPHERECTOMY N/A 12/31/2017   Procedure: XI ROBOTIC ASSISTED TOTAL HYSTERECTOMY WITH BILATERAL SALPINGO OOPHORECTOMY;  Surgeon: Everitt Amber, MD;  Location: [facility];  Service: Gynecology;  Laterality: N/A;  . SHOULDER ARTHROSCOPY W/ ROTATOR CUFF REPAIR Left 09/2016   and burectomy     OB History   No obstetric history on file.     Family History  Problem Relation Age of Onset  . Diabetes Mother   . Heart disease Mother   . Diabetes Maternal Grandmother   . Diabetes Maternal Grandfather   . Asthma Sister   . Cancer Paternal Grandmother   . Cancer Maternal Uncle   . Uterine cancer Maternal Uncle   . Cancer Maternal Aunt   . Cancer Brother     Social History   Tobacco Use  . Smoking status: Former Smoker    Packs/day: 1.00    Years: 30.00    Pack years: 30.00    Types: Cigarettes    Quit date: 06/01/2000    Years since quitting: 19.7  . Smokeless tobacco: Never Used  Vaping Use  . Vaping Use: Never used  Substance Use Topics  . Alcohol use: No  . Drug  use: Never    Home Medications Prior to Admission medications   Medication Sig Start Date End Date Taking? Authorizing Provider  Acetaminophen (TYLENOL ARTHRITIS PAIN PO) Take by mouth.    [provider]  atorvastatin (LIPITOR) 40 MG tablet TAKE 1 TABLET BY MOUTH EVERY DAY 05/26/19   Isaac Bliss, Rayford Halsted, MD  buPROPion (WELLBUTRIN XL) 150 MG 24 hr tablet TAKE 1 TABLET BY MOUTH EVERY DAY 02/09/20   Isaac Bliss, Rayford Halsted, MD  Cholecalciferol (VITAMIN D3) 50 MCG (2000 UT) CHEW Chew by mouth.    [provider]  citalopram (CELEXA) 40 MG tablet Take 2 tablets (80 mg total) by mouth at bedtime. 11/20/19   Isaac Bliss, Rayford Halsted, MD  cyanocobalamin (,VITAMIN B-12,) 1000 MCG/ML  injection Inject 1 mL (1,000 mcg total) into the muscle every 30 (thirty) days. 04/01/19   Isaac Bliss, Rayford Halsted, MD  hydrochlorothiazide (HYDRODIURIL) 25 MG tablet TAKE 1 TABLET BY MOUTH EVERY DAY 02/09/20   Isaac Bliss, Rayford Halsted, MD  ibuprofen (ADVIL,MOTRIN) 200 MG tablet Take 600 mg by mouth every 6 (six) hours as needed for moderate pain.    [provider]  levothyroxine (SYNTHROID) 75 MCG tablet TAKE 1 TABLET (75 MCG TOTAL) BY MOUTH DAILY BEFORE BREAKFAST. 02/09/20   Isaac Bliss, Rayford Halsted, MD  Multiple Vitamins-Minerals (ONE-A-DAY WOMENS PO) Take by mouth.    [provider]  SYRINGE-NEEDLE, DISP, 3 ML (BD SAFETYGLIDE SYRINGE/NEEDLE) 25G X 1" 3 ML MISC Use for B12 injections 05/19/19   Isaac Bliss, Rayford Halsted, MD    Allergies    Tetracycline hcl  Review of Systems   Review of Systems  All other systems reviewed and are negative.   Physical Exam Updated Vital Signs BP (!) 153/82   Pulse 64   Temp 97.9 F (36.6 C) (Oral)   Resp 17   Ht 5\' 6"  (1.676 m)   Wt 106.6 kg   SpO2 100%   BMI 37.93 kg/m   Physical Exam Vitals and nursing note reviewed.  Constitutional:      General: She is not in acute distress.    Appearance: She is well-developed. She is obese. She is not ill-appearing.  HENT:     Head: Normocephalic and atraumatic.     Right Ear: External ear normal.     Left Ear: External ear normal.  Eyes:     Conjunctiva/sclera: Conjunctivae normal.     Pupils: Pupils are equal, round, and reactive to light.  Neck:     Trachea: Phonation normal.  Cardiovascular:     Rate and Rhythm: Normal rate and regular rhythm.     Heart sounds: Normal heart sounds.  Pulmonary:     Effort: Pulmonary effort is normal. No respiratory distress.     Breath sounds: Normal breath sounds. No stridor. No wheezing or rhonchi.  Abdominal:     Palpations: Abdomen is soft.     Tenderness: There is no abdominal tenderness.  Musculoskeletal:        General:  Tenderness (Left lower leg, mild) present. Normal range of motion.     Cervical back: Normal range of motion and neck supple.     Right lower leg: No edema.     Left lower leg: No edema.  Skin:    General: Skin is warm and dry.  Neurological:     Mental Status: She is alert and oriented to person, place, and time.     Cranial Nerves: No cranial nerve deficit.  Sensory: No sensory deficit.     Motor: No abnormal muscle tone.     Coordination: Coordination normal.  Psychiatric:        Mood and Affect: Mood normal.        Behavior: Behavior normal.        Thought Content: Thought content normal.        Judgment: Judgment normal.     ED Results / Procedures / Treatments   Labs (all labs ordered are listed, but only abnormal results are displayed) Labs Reviewed  BASIC METABOLIC PANEL  CBC  TROPONIN I (HIGH SENSITIVITY)  TROPONIN I (HIGH SENSITIVITY)    EKG None  Radiology DG Chest 2 View  Result Date: 02/17/2020 CLINICAL DATA:  Chest heaviness with cough. EXAM: CHEST - 2 VIEW COMPARISON:  08/06/2016 FINDINGS: Normal heart size and mediastinal contours. Artifact from EKG leads. No acute infiltrate or edema. No effusion or pneumothorax. No acute osseous findings. IMPRESSION: No active cardiopulmonary disease. Electronically Signed   By: Monte Fantasia M.D.   On: 02/17/2020 09:45    Procedures Procedures (including critical care time)  Medications Ordered in ED Medications - No data to display  ED Course  I have reviewed the triage vital signs and the nursing notes.  Pertinent labs & imaging results that were available during my care of the patient were reviewed by me and considered in my medical decision making (see chart for details).    MDM Rules/Calculators/A&P                           Patient Vitals for the past 24 hrs:  BP Temp Temp src Pulse Resp SpO2 Height Weight  02/17/20 1120 (!) 153/82 -- -- 64 17 100 % -- --  02/17/20 1110 (!) 146/77 -- -- 67 16 100  % -- --  02/17/20 0930 -- -- -- -- -- -- 5\' 6"  (1.676 m) 106.6 kg  02/17/20 0925 (!) 157/86 97.9 F (36.6 C) Oral 77 17 100 % -- --    1:34 PM Reevaluation with update and discussion. After initial assessment and treatment, an updated evaluation reveals no change in clinical status, findings discussed with the patient and all questions were answered. Daleen Bo   Medical Decision Making:  This patient is presenting for evaluation of shortness of breath for several months, which does require a range of treatment options, and is a complaint that involves a moderate risk of morbidity and mortality. The differential diagnoses include pneumonia, asthma, COPD, PE. I decided to review old records, and in summary obese female currently treated with CPAP at home, for sleep apnea.  She is presenting with ongoing shortness of breath and dyspnea on exertion.  I did not require additional historical information from anyone.  Clinical Laboratory Tests Ordered, included CBC, Metabolic panel and Troponin. Review indicates normal findings. Radiologic Tests Ordered, included chest x-ray, CT angiogram chest.  I independently Visualized: Radiographic images, which show no PE or pneumonia.  Nonspecific small airway disease  Cardiac Monitor Tracing which shows normal sinus rhythm    Critical Interventions-clinical evaluation, radiography, laboratory testing, observation reassessment  After These Interventions, the Patient was reevaluated and was found stable for discharge.  Evaluation essentially negative here.  Patient is not wheezing and has normal vital signs other than mild blood pressure elevation.  No evidence for ACS, PE, pneumonia, asthma or emphysema.  Stable for discharge.  CRITICAL CARE-no Performed by: Daleen Bo  Nursing Notes Reviewed/  Care Coordinated Applicable Imaging Reviewed Interpretation of Laboratory Data incorporated into ED treatment  The patient appears reasonably screened  and/or stabilized for discharge and I doubt any other medical condition or other [facility] requiring further screening, evaluation, or treatment in the ED at this time prior to discharge.  Plan: Home Medications-continue usual medications; Home Treatments-rest, fluids; return here if the recommended treatment, does not improve the symptoms; Recommended follow up-pulmonary checkup for further evaluation of shortness of breath and reevaluation of sleep apnea.  PCP, as needed     Final Clinical Impression(s) / ED Diagnoses Final diagnoses:  None    Rx / DC Orders ED Discharge Orders    None       Daleen Bo, MD 02/17/20 1337

## 2020-02-17 NOTE — Progress Notes (Addendum)
Virtual Visit via Telephone Note  I connected with Erika [name] on 02/17/20 at  8:30 AM EDT by telephone and verified that I am speaking with the correct person using two identifiers.   I discussed the limitations, risks, security and privacy concerns of performing an evaluation and management service by telephone and the availability of in person appointments. I also discussed with the patient that there may be a patient responsible charge related to this service. The patient expressed understanding and agreed to proceed.  Location patient: home Location provider: work office Participants present for the call: patient, provider Patient did not have a visit in the prior 7 days to address this/these issue(s).   History of Present Illness:  This was initially scheduled as an office visit, however due to her symptoms she was sent to the car and rescheduled as virtual.  We had difficulty connecting on an audiovisual platform so it has been turned over into a telephone consultation.  Erika [name] comes in today complaining of a 2 to 3-week history of progressively worsening shortness of breath and dyspnea on exertion.  She initially thought this was allergies so has been taking antihistamines and Mucinex without any relief.  She now has a feeling of chest congestion and phlegm in her throat.  She has a dry, nonproductive cough, no fever or chills.  She feels a heavy pressure sensation in the center of her chest and she feels very fatigued.  She has a history of asthma but this does not feel like any of her prior asthma exacerbations.  She has no sick contacts or recent travel.  She has had 2 Covid vaccines, not yet vaccinated against flu this season.   Observations/Objective: Patient sounds short of breath and hoarse on the phone. She is unable to complete full sentences and needs to stop due to shortness of breath. Speech and thought processing are grossly intact. Patient reported vitals: None  reported   Current Outpatient Medications:  .  Acetaminophen (TYLENOL ARTHRITIS PAIN PO), Take by mouth., Disp: , Rfl:  .  atorvastatin (LIPITOR) 40 MG tablet, TAKE 1 TABLET BY MOUTH EVERY DAY, Disp: 90 tablet, Rfl: 1 .  buPROPion (WELLBUTRIN XL) 150 MG 24 hr tablet, TAKE 1 TABLET BY MOUTH EVERY DAY, Disp: 90 tablet, Rfl: 1 .  Cholecalciferol (VITAMIN D3) 50 MCG (2000 UT) CHEW, Chew by mouth., Disp: , Rfl:  .  citalopram (CELEXA) 40 MG tablet, Take 2 tablets (80 mg total) by mouth at bedtime., Disp: 180 tablet, Rfl: 0 .  cyanocobalamin (,VITAMIN B-12,) 1000 MCG/ML injection, Inject 1 mL (1,000 mcg total) into the muscle every 30 (thirty) days., Disp: 18 mL, Rfl: 2 .  hydrochlorothiazide (HYDRODIURIL) 25 MG tablet, TAKE 1 TABLET BY MOUTH EVERY DAY, Disp: 90 tablet, Rfl: 1 .  ibuprofen (ADVIL,MOTRIN) 200 MG tablet, Take 600 mg by mouth every 6 (six) hours as needed for moderate pain., Disp: , Rfl:  .  levothyroxine (SYNTHROID) 75 MCG tablet, TAKE 1 TABLET (75 MCG TOTAL) BY MOUTH DAILY BEFORE BREAKFAST., Disp: 90 tablet, Rfl: 1 .  Multiple Vitamins-Minerals (ONE-A-DAY WOMENS PO), Take by mouth., Disp: , Rfl:  .  SYRINGE-NEEDLE, DISP, 3 ML (BD SAFETYGLIDE SYRINGE/NEEDLE) 25G X 1" 3 ML MISC, Use for B12 injections, Disp: 100 each, Rfl: 11  Review of Systems:  Constitutional: Denies fever, chills, diaphoresis. HEENT: Denies photophobia, eye pain, redness, hearing loss, ear pain, mouth sores, trouble swallowing, neck pain, neck stiffness and tinnitus.   Respiratory: Positive  for SOB, DOE, cough, chest tightness,  and wheezing.   Cardiovascular: Denies palpitations and leg swelling.  Gastrointestinal: Denies nausea, vomiting, abdominal pain, diarrhea, constipation, blood in stool and abdominal distention.  Genitourinary: Denies dysuria, urgency, frequency, hematuria, flank pain and difficulty urinating.  Endocrine: Denies: hot or cold intolerance, sweats, changes in hair or nails, polyuria,  polydipsia. Musculoskeletal: Denies myalgias, back pain, joint swelling, arthralgias and gait problem.  Skin: Denies pallor, rash and wound.  Neurological: Denies dizziness, seizures, syncope, light-headedness, numbness and headaches.  Hematological: Denies adenopathy. Easy bruising, personal or family bleeding history  Psychiatric/Behavioral: Denies suicidal ideation, mood changes, confusion, nervousness, sleep disturbance and agitation   Assessment and Plan:  SOB (shortness of breath) DOE (dyspnea on exertion) Chest pressure Fatigue, unspecified type  -Based on her symptoms today, I have advised emergent evaluation in the emergency department.  Differential diagnosis includes asthma exacerbation, Covid with Covid pneumonia, bacterial pneumonia, pulmonary embolism, even heart disease is a possibility. -She agrees, we will check in with her later today to make sure she made it safely to the ED.    I discussed the assessment and treatment plan with the patient. The patient was provided an opportunity to ask questions and all were answered. The patient agreed with the plan and demonstrated an understanding of the instructions.   The patient was advised to call back or seek an in-person evaluation if the symptoms worsen or if the condition fails to improve as anticipated.  I provided 23 minutes of non-face-to-face time during this encounter.   Lelon Frohlich, MD [facility] at [facility]

## 2020-02-17 NOTE — Discharge Instructions (Signed)
The testing today does not show any serious problems.  Call the pulmonary doctor for an appointment to be seen about the trouble breathing.  This may be related to your sleep apnea and CPAP.  Return here, if needed, for problems.

## 2020-02-17 NOTE — ED Triage Notes (Signed)
Patient arrives to ED with complaints of shortness of breath, chest pressure, and fatigue x3 weeks. Pt states the symptoms are slowly worsening. Pt states she is experiencing orthopnea and dyspnea on exertion. Denies fevers and chills.

## 2020-02-17 NOTE — ED Notes (Signed)
Pt discharged ambulatory. All questions and concerns addressed. No complaints at this time.   

## 2020-02-18 ENCOUNTER — Telehealth: Payer: Self-pay | Admitting: *Deleted

## 2020-02-18 DIAGNOSIS — J4520 Mild intermittent asthma, uncomplicated: Secondary | ICD-10-CM

## 2020-02-18 NOTE — Telephone Encounter (Signed)
Referral sent 

## 2020-02-18 NOTE — Addendum Note (Signed)
Addended by: Westley Hummer B on: 02/18/2020 01:22 PM   Modules accepted: Orders

## 2020-02-18 NOTE — Telephone Encounter (Signed)
Patient called requesting a referral to Dr Percival Spanish at Pulmonary she has an appointment on 12/2 with that provider.     [phone]

## 2020-03-17 ENCOUNTER — Encounter: Payer: Self-pay | Admitting: Pulmonary Disease

## 2020-03-17 ENCOUNTER — Ambulatory Visit (INDEPENDENT_AMBULATORY_CARE_PROVIDER_SITE_OTHER): Payer: Medicare Other | Admitting: Pulmonary Disease

## 2020-03-17 ENCOUNTER — Other Ambulatory Visit: Payer: Self-pay

## 2020-03-17 VITALS — BP 120/82 | HR 82 | Temp 98.6°F | Ht 66.0 in | Wt 236.6 lb

## 2020-03-17 DIAGNOSIS — G4733 Obstructive sleep apnea (adult) (pediatric): Secondary | ICD-10-CM | POA: Diagnosis not present

## 2020-03-17 DIAGNOSIS — Z9989 Dependence on other enabling machines and devices: Secondary | ICD-10-CM

## 2020-03-17 NOTE — Progress Notes (Signed)
Erika [name]    [id number]    04/28/1955  Primary Care Physician:Hernandez Everardo Beals, MD  Referring Physician: Isaac Bliss, Rayford Halsted, MD [address] [place],  [place] [address]  Chief complaint:   Patient with a history of obstructive sleep apnea  HPI:  History of obstructive sleep apnea diagnosed over 12 to 13 years ago Has been using CPAP on a regular basis Continues to benefit from CPAP use  Uses CPAP nightly Wakes up feeling like she is agitated at nights rest Wakes up with a dry mouth Denies headaches She does have occasional night sweats  Dad had OSA  She admits to better sleep with CPAP on  She does have a history of hypertension, history of congestive heart failure, hypothyroidism, history of depression and anxiety  Never smoker  Outpatient Encounter Medications as of 03/17/2020  Medication Sig  . acetaminophen (TYLENOL) 650 MG CR tablet Take 650-1,300 mg by mouth every 8 (eight) hours as needed for pain.  Marland Kitchen atorvastatin (LIPITOR) 40 MG tablet TAKE 1 TABLET BY MOUTH EVERY DAY  . buPROPion (WELLBUTRIN XL) 150 MG 24 hr tablet TAKE 1 TABLET BY MOUTH EVERY DAY (Patient taking differently: Take 150 mg by mouth daily. )  . cetirizine (ZYRTEC) 10 MG tablet Take 10 mg by mouth daily as needed for allergies.  . Cholecalciferol (VITAMIN D3) 50 MCG (2000 UT) CHEW Chew 2,000 Units by mouth daily.   . citalopram (CELEXA) 40 MG tablet Take 2 tablets (80 mg total) by mouth at bedtime.  . cyanocobalamin (,VITAMIN B-12,) 1000 MCG/ML injection Inject 1 mL (1,000 mcg total) into the muscle every 30 (thirty) days.  . hydrochlorothiazide (HYDRODIURIL) 25 MG tablet TAKE 1 TABLET BY MOUTH EVERY DAY (Patient taking differently: Take 25 mg by mouth daily. )  . ibuprofen (ADVIL,MOTRIN) 200 MG tablet Take 600 mg by mouth every 6 (six) hours as needed for moderate pain.  Marland Kitchen levothyroxine (SYNTHROID) 75 MCG tablet TAKE 1 TABLET (75 MCG TOTAL) BY MOUTH DAILY  BEFORE BREAKFAST.  . SYRINGE-NEEDLE, DISP, 3 ML (BD SAFETYGLIDE SYRINGE/NEEDLE) 25G X 1" 3 ML MISC Use for B12 injections   No facility-administered encounter medications on file as of 03/17/2020.    Allergies as of 03/17/2020 - Review Complete 03/17/2020  Allergen Reaction Noted  . Tetracycline hcl Rash     Past Medical History:  Diagnosis Date  . Anxiety   . Arthritis   . History of cardiac murmur as a child   . Menopausal syndrome   . Mild asthma    no inhalers since stopped smoking  . Neuroma of foot   . OSA on CPAP   . Wears glasses     Past Surgical History:  Procedure Laterality Date  . ELBOW SURGERY Right 2002   repair tendon and nerve injury  . EXCISION MORTON'S NEUROMA Left 2010  . ROBOTIC ASSISTED TOTAL HYSTERECTOMY WITH BILATERAL SALPINGO OOPHERECTOMY N/A 12/31/2017   Procedure: XI ROBOTIC ASSISTED TOTAL HYSTERECTOMY WITH BILATERAL SALPINGO OOPHORECTOMY;  Surgeon: Everitt Amber, MD;  Location: [facility];  Service: Gynecology;  Laterality: N/A;  . SHOULDER ARTHROSCOPY W/ ROTATOR CUFF REPAIR Left 09/2016   and burectomy    Family History  Problem Relation Age of Onset  . Diabetes Mother   . Heart disease Mother   . Diabetes Maternal Grandmother   . Diabetes Maternal Grandfather   . Asthma Sister   . Cancer Paternal Grandmother   . Cancer Maternal Uncle   .  Uterine cancer Maternal Uncle   . Cancer Maternal Aunt   . Cancer Brother     Social History   Socioeconomic History  . Marital status: Married    Spouse name: Not on file  . Number of children: Not on file  . Years of education: Not on file  . Highest education level: Not on file  Occupational History  . Occupation: Solicitor: [employer]  Tobacco Use  . Smoking status: Former Smoker    Packs/day: 1.00    Years: 30.00    Pack years: 30.00    Types: Cigarettes    Quit date: 06/01/2000    Years since quitting: 19.8  . Smokeless tobacco: Never Used  Vaping Use  . Vaping  Use: Never used  Substance and Sexual Activity  . Alcohol use: No  . Drug use: Never  . Sexual activity: Not Currently    Birth control/protection: Post-menopausal  Other Topics Concern  . Not on file  Social History Narrative  . Not on file   Social Determinants of Health   Financial Resource Strain:   . Difficulty of Paying Living Expenses: Not on file  Food Insecurity:   . Worried About Charity fundraiser in the Last Year: Not on file  . Ran Out of Food in the Last Year: Not on file  Transportation Needs:   . Lack of Transportation (Medical): Not on file  . Lack of Transportation (Non-Medical): Not on file  Physical Activity:   . Days of Exercise per Week: Not on file  . Minutes of Exercise per Session: Not on file  Stress:   . Feeling of Stress : Not on file  Social Connections:   . Frequency of Communication with Friends and Family: Not on file  . Frequency of Social Gatherings with Friends and Family: Not on file  . Attends Religious Services: Not on file  . Active Member of Clubs or Organizations: Not on file  . Attends Archivist Meetings: Not on file  . Marital Status: Not on file  Intimate Partner Violence:   . Fear of Current or Ex-Partner: Not on file  . Emotionally Abused: Not on file  . Physically Abused: Not on file  . Sexually Abused: Not on file    Review of Systems  Constitutional: Positive for fatigue.  Respiratory: Positive for apnea.   Psychiatric/Behavioral: Positive for sleep disturbance.    Vitals:   03/17/20 1417  BP: 120/82  Pulse: 82  Temp: 98.6 F (37 C)  SpO2: 98%     Physical Exam Constitutional:      Appearance: She is obese.  HENT:     Nose: No congestion or rhinorrhea.     Mouth/Throat:     Pharynx: No oropharyngeal exudate or posterior oropharyngeal erythema.  Eyes:     General:        Right eye: No discharge.        Left eye: No discharge.  Cardiovascular:     Rate and Rhythm: Normal rate and regular  rhythm.     Heart sounds: No murmur heard.  No friction rub.  Pulmonary:     Effort: Pulmonary effort is normal. No respiratory distress.     Breath sounds: Normal breath sounds. No stridor. No wheezing or rhonchi.  Musculoskeletal:     Cervical back: No rigidity or tenderness.  Neurological:     Mental Status: She is alert.  Psychiatric:  Mood and Affect: Mood normal.    Data Reviewed: Compliance data reviewed showing 99% compliance Machine set between 5 and 20 She does have occasional mask leaks 95 percentile pressure of 14.1 Residual AHI of 2.7  Assessment:  Obstructive sleep apnea -Adequately treated with CPAP therapy  Mouth dryness -May be related to oral venting at night -Continues to use humidification -A chinstrap may be an option but this may not be well tolerated  She continues to benefit from using CPAP on a regular basis with improvement in daytime symptoms  Encouraged to continue using CPAP on a regular basis  History of congestive heart failure  History of hypertension  Plan/Recommendations: Graded exercises as tolerated  Importance of exercise and weight loss was discussed with the patient  Continue using CPAP at current pressures  I will see her back in about 6 months  Encouraged to call with any significant concerns   Sherrilyn Rist MD [facility] 03/17/2020, 2:43 PM  CC: Isaac Bliss, [place]*

## 2020-03-17 NOTE — Patient Instructions (Signed)
Obstructive sleep apnea -Adequately treated with CPAP therapy  Continue CPAP on a regular basis  Graded exercises as tolerated Focus on weight loss efforts  I will see you back in 6 months  Call with significant concerns

## 2020-03-18 ENCOUNTER — Other Ambulatory Visit: Payer: Self-pay | Admitting: Internal Medicine

## 2020-03-18 DIAGNOSIS — F3342 Major depressive disorder, recurrent, in full remission: Secondary | ICD-10-CM

## 2020-03-18 MED ORDER — CITALOPRAM HYDROBROMIDE 40 MG PO TABS: 80.0000 mg | ORAL_TABLET | Freq: Every day | ORAL | 1 refills | Status: DC

## 2020-03-18 NOTE — Telephone Encounter (Signed)
Pt is calling in stating that she has been out of this medication for 8 days of Rx citalopram (CELEXA) 40 MG  Pharm: [facility] on [address] [place], [place]

## 2020-03-24 ENCOUNTER — Telehealth: Payer: Self-pay | Admitting: *Deleted

## 2020-03-24 DIAGNOSIS — F3342 Major depressive disorder, recurrent, in full remission: Secondary | ICD-10-CM

## 2020-03-24 MED ORDER — CITALOPRAM HYDROBROMIDE 40 MG PO TABS: 80.0000 mg | ORAL_TABLET | Freq: Every day | ORAL | 1 refills | Status: DC

## 2020-03-24 NOTE — Telephone Encounter (Signed)
Prior Auth requested for Celexa 40 mg twice daily.  Spoke with patient and PA not needed at this time.  She paid out of pocket.  A 30 day refill sent with a refill.

## 2020-05-05 ENCOUNTER — Other Ambulatory Visit: Payer: Self-pay | Admitting: Internal Medicine

## 2020-05-05 ENCOUNTER — Other Ambulatory Visit: Payer: Self-pay

## 2020-05-05 ENCOUNTER — Ambulatory Visit (INDEPENDENT_AMBULATORY_CARE_PROVIDER_SITE_OTHER): Payer: Medicare Other | Admitting: Internal Medicine

## 2020-05-05 ENCOUNTER — Encounter: Payer: Self-pay | Admitting: Internal Medicine

## 2020-05-05 VITALS — BP 122/78 | HR 80 | Temp 98.0°F | Ht 66.0 in | Wt 233.7 lb

## 2020-05-05 DIAGNOSIS — G4733 Obstructive sleep apnea (adult) (pediatric): Secondary | ICD-10-CM | POA: Diagnosis not present

## 2020-05-05 DIAGNOSIS — E785 Hyperlipidemia, unspecified: Secondary | ICD-10-CM

## 2020-05-05 DIAGNOSIS — Z0000 Encounter for general adult medical examination without abnormal findings: Secondary | ICD-10-CM | POA: Diagnosis not present

## 2020-05-05 DIAGNOSIS — J4520 Mild intermittent asthma, uncomplicated: Secondary | ICD-10-CM

## 2020-05-05 DIAGNOSIS — E538 Deficiency of other specified B group vitamins: Secondary | ICD-10-CM | POA: Diagnosis not present

## 2020-05-05 DIAGNOSIS — Z13820 Encounter for screening for osteoporosis: Secondary | ICD-10-CM

## 2020-05-05 DIAGNOSIS — Z1231 Encounter for screening mammogram for malignant neoplasm of breast: Secondary | ICD-10-CM | POA: Diagnosis not present

## 2020-05-05 DIAGNOSIS — I10 Essential (primary) hypertension: Secondary | ICD-10-CM | POA: Diagnosis not present

## 2020-05-05 DIAGNOSIS — E039 Hypothyroidism, unspecified: Secondary | ICD-10-CM | POA: Diagnosis not present

## 2020-05-05 DIAGNOSIS — E559 Vitamin D deficiency, unspecified: Secondary | ICD-10-CM

## 2020-05-05 DIAGNOSIS — Z780 Asymptomatic menopausal state: Secondary | ICD-10-CM

## 2020-05-05 DIAGNOSIS — F3342 Major depressive disorder, recurrent, in full remission: Secondary | ICD-10-CM

## 2020-05-05 DIAGNOSIS — R5383 Other fatigue: Secondary | ICD-10-CM

## 2020-05-05 DIAGNOSIS — I509 Heart failure, unspecified: Secondary | ICD-10-CM | POA: Diagnosis not present

## 2020-05-05 DIAGNOSIS — Z1211 Encounter for screening for malignant neoplasm of colon: Secondary | ICD-10-CM | POA: Diagnosis not present

## 2020-05-05 LAB — TSH: TSH: 2.8 u[IU]/mL (ref 0.35–4.50)

## 2020-05-05 LAB — COMPREHENSIVE METABOLIC PANEL
ALT: 18 U/L (ref 0–35)
AST: 19 U/L (ref 0–37)
Albumin: 4.5 g/dL (ref 3.5–5.2)
Alkaline Phosphatase: 75 U/L (ref 39–117)
BUN: 20 mg/dL (ref 6–23)
CO2: 27 mEq/L (ref 19–32)
Calcium: 10.1 mg/dL (ref 8.4–10.5)
Chloride: 101 mEq/L (ref 96–112)
Creatinine, Ser: 0.85 mg/dL (ref 0.40–1.20)
GFR: 71.93 mL/min (ref >60.00)
Glucose, Bld: 94 mg/dL (ref 70–99)
Potassium: 3.8 mEq/L (ref 3.5–5.1)
Sodium: 137 mEq/L (ref 135–145)
Total Bilirubin: 0.3 mg/dL (ref 0.2–1.2)
Total Protein: 8 g/dL (ref 6.0–8.3)

## 2020-05-05 LAB — CBC WITH DIFFERENTIAL/PLATELET
Basophils Absolute: 0 10*3/uL (ref 0.0–0.1)
Basophils Relative: 0.4 % (ref 0.0–3.0)
Eosinophils Absolute: 0.1 10*3/uL (ref 0.0–0.7)
Eosinophils Relative: 0.7 % (ref 0.0–5.0)
HCT: 40.1 % (ref 36.0–46.0)
Hemoglobin: 13.2 g/dL (ref 12.0–15.0)
Lymphocytes Relative: 21.8 % (ref 12.0–46.0)
Lymphs Abs: 1.7 10*3/uL (ref 0.7–4.0)
MCHC: 32.8 g/dL (ref 30.0–36.0)
MCV: 79.9 fl (ref 78.0–100.0)
Monocytes Absolute: 0.4 10*3/uL (ref 0.1–1.0)
Monocytes Relative: 4.9 % (ref 3.0–12.0)
Neutro Abs: 5.8 10*3/uL (ref 1.4–7.7)
Neutrophils Relative %: 72.2 % (ref 43.0–77.0)
Platelets: 366 10*3/uL (ref 150.0–400.0)
RBC: 5.02 Mil/uL (ref 3.87–5.11)
RDW: 15.5 % (ref 11.5–15.5)
WBC: 8 10*3/uL (ref 4.0–10.5)

## 2020-05-05 LAB — LIPID PANEL
Cholesterol: 254 mg/dL — ABNORMAL HIGH (ref 0–200)
HDL: 60.3 mg/dL (ref >39.00)
LDL Cholesterol: 162 mg/dL — ABNORMAL HIGH (ref 0–99)
NonHDL: 193.78
Total CHOL/HDL Ratio: 4
Triglycerides: 159 mg/dL — ABNORMAL HIGH (ref 0.0–149.0)
VLDL: 31.8 mg/dL (ref 0.0–40.0)

## 2020-05-05 LAB — VITAMIN B12: Vitamin B-12: 307 pg/mL (ref 211–911)

## 2020-05-05 LAB — HEMOGLOBIN A1C: Hgb A1c MFr Bld: 5.7 % (ref 4.6–6.5)

## 2020-05-05 LAB — VITAMIN D 25 HYDROXY (VIT D DEFICIENCY, FRACTURES): VITD: 28.13 ng/mL — ABNORMAL LOW (ref 30.00–100.00)

## 2020-05-05 MED ORDER — VITAMIN D (ERGOCALCIFEROL) 1.25 MG (50000 UNIT) PO CAPS: 50000.0000 [IU] | ORAL_CAPSULE | ORAL | 0 refills | Status: DC

## 2020-05-05 NOTE — Progress Notes (Signed)
Established Patient Office Visit     This visit occurred during the SARS-CoV-2 public health emergency.  Safety protocols were in place, including screening questions prior to the visit, additional usage of staff PPE, and extensive cleaning of exam room while observing appropriate contact time as indicated for disinfecting solutions.    CC/Reason for Visit: Welcome to Medicare visit  HPI: Erika [name] is a 65 y.o. female who is coming in today for the above mentioned reasons. Past Medical History is significant for: Mild asthma not on medications, sleep apnea on nightly CPAP, hypothyroidism that has not been well controlled, depression that has not been well controlled, vitamin D deficiencyand B12 deficiency.  She feels tired, fatigued and has noticed some mild memory changes, like forgetting things in the middle of a sentence.  She has not been doing her B12 injections or taking vitamin D, her thyroid levels have not been checked in a while.  She had a Pap smear in 2019.  She is overdue for colonoscopy, mammogram, bone density.  She has had all 3 COVID vaccines and influenza.  She had her first pneumonia vaccine in December.  She is due to update her shingles vaccination.  She does not have routine eye or dental care.  She has no perceived hearing issues, she does not exercise routinely.   Past Medical/Surgical History: Past Medical History:  Diagnosis Date  . Anxiety   . Arthritis   . History of cardiac murmur as a child   . Menopausal syndrome   . Mild asthma    no inhalers since stopped smoking  . Neuroma of foot   . OSA on CPAP   . Wears glasses     Past Surgical History:  Procedure Laterality Date  . ELBOW SURGERY Right 2002   repair tendon and nerve injury  . EXCISION MORTON'S NEUROMA Left 2010  . ROBOTIC ASSISTED TOTAL HYSTERECTOMY WITH BILATERAL SALPINGO OOPHERECTOMY N/A 12/31/2017   Procedure: XI ROBOTIC ASSISTED TOTAL HYSTERECTOMY WITH BILATERAL SALPINGO  OOPHORECTOMY;  Surgeon: Everitt Amber, MD;  Location: [facility];  Service: Gynecology;  Laterality: N/A;  . SHOULDER ARTHROSCOPY W/ ROTATOR CUFF REPAIR Left 09/2016   and burectomy    Social History:  reports that she quit smoking about 19 years ago. Her smoking use included cigarettes. She has a 30.00 pack-year smoking history. She has never used smokeless tobacco. She reports that she does not drink alcohol and does not use drugs.  Allergies: Allergies  Allergen Reactions  . Tetracycline Hcl Rash    Family History:  Family History  Problem Relation Age of Onset  . Diabetes Mother   . Heart disease Mother   . Diabetes Maternal Grandmother   . Diabetes Maternal Grandfather   . Asthma Sister   . Cancer Paternal Grandmother   . Cancer Maternal Uncle   . Uterine cancer Maternal Uncle   . Cancer Maternal Aunt   . Cancer Brother      Current Outpatient Medications:  .  acetaminophen (TYLENOL) 650 MG CR tablet, Take 650-1,300 mg by mouth every 8 (eight) hours as needed for pain., Disp: , Rfl:  .  buPROPion (WELLBUTRIN XL) 150 MG 24 hr tablet, TAKE 1 TABLET BY MOUTH EVERY DAY (Patient taking differently: Take 150 mg by mouth daily.), Disp: 90 tablet, Rfl: 1 .  Cholecalciferol (VITAMIN D3) 50 MCG (2000 UT) CHEW, Chew 2,000 Units by mouth daily. , Disp: , Rfl:  .  citalopram (CELEXA) 40 MG tablet, Take 2  tablets (80 mg total) by mouth at bedtime. (Patient taking differently: Take 80 mg by mouth at bedtime. Insurance may not cover 90 days), Disp: 60 tablet, Rfl: 1 .  cyanocobalamin (,VITAMIN B-12,) 1000 MCG/ML injection, Inject 1 mL (1,000 mcg total) into the muscle every 30 (thirty) days., Disp: 18 mL, Rfl: 2 .  ibuprofen (ADVIL,MOTRIN) 200 MG tablet, Take 600 mg by mouth every 6 (six) hours as needed for moderate pain., Disp: , Rfl:  .  levothyroxine (SYNTHROID) 75 MCG tablet, TAKE 1 TABLET (75 MCG TOTAL) BY MOUTH DAILY BEFORE BREAKFAST., Disp: 90 tablet, Rfl: 1 .  SYRINGE-NEEDLE, DISP, 3 ML  (BD SAFETYGLIDE SYRINGE/NEEDLE) 25G X 1" 3 ML MISC, Use for B12 injections, Disp: 100 each, Rfl: 11 .  atorvastatin (LIPITOR) 40 MG tablet, TAKE 1 TABLET BY MOUTH EVERY DAY (Patient not taking: Reported on 05/05/2020), Disp: 90 tablet, Rfl: 1 .  cetirizine (ZYRTEC) 10 MG tablet, Take 10 mg by mouth daily as needed for allergies. (Patient not taking: Reported on 05/05/2020), Disp: , Rfl:  .  hydrochlorothiazide (HYDRODIURIL) 25 MG tablet, TAKE 1 TABLET BY MOUTH EVERY DAY (Patient not taking: Reported on 05/05/2020), Disp: 90 tablet, Rfl: 1  Review of Systems:  Constitutional: Denies fever, chills, diaphoresis, appetite change and fatigue.  HEENT: Denies photophobia, eye pain, redness, hearing loss, ear pain, congestion, sore throat, rhinorrhea, sneezing, mouth sores, trouble swallowing, neck pain, neck stiffness and tinnitus.   Respiratory: Denies SOB, DOE, cough, chest tightness,  and wheezing.   Cardiovascular: Denies chest pain, palpitations and leg swelling.  Gastrointestinal: Denies nausea, vomiting, abdominal pain, diarrhea, constipation, blood in stool and abdominal distention.  Genitourinary: Denies dysuria, urgency, frequency, hematuria, flank pain and difficulty urinating.  Endocrine: Denies: hot or cold intolerance, sweats, changes in hair or nails, polyuria, polydipsia. Musculoskeletal: Denies myalgias, back pain, joint swelling, arthralgias and gait problem.  Skin: Denies pallor, rash and wound.  Neurological: Denies dizziness, seizures, syncope, weakness, light-headedness, numbness and headaches.  Hematological: Denies adenopathy. Easy bruising, personal or family bleeding history  Psychiatric/Behavioral: Denies suicidal ideation, mood changes, confusion, nervousness, sleep disturbance and agitation    Physical Exam: Vitals:   05/05/20 0911  BP: 122/78  Pulse: 80  Temp: 98 F (36.7 C)  TempSrc: Oral  SpO2: 98%  Weight: 233 lb 11.2 oz (106 kg)  Height: '5\' 6"'  (1.676 m)     Body mass index is 37.72 kg/m.   Constitutional: NAD, calm, comfortable Eyes: PERRL, lids and conjunctivae normal, wears corrective lenses ENMT: Mucous membranes are moist. Posterior pharynx clear of any exudate or lesions. Normal dentition. Tympanic membrane is pearly white, no erythema or bulging. Neck: normal, supple, no masses, no thyromegaly Respiratory: clear to auscultation bilaterally, no wheezing, no crackles. Normal respiratory effort. No accessory muscle use.  Cardiovascular: Regular rate and rhythm, no murmurs / rubs / gallops. No extremity edema. 2+ pedal pulses. No carotid bruits.  Abdomen: no tenderness, no masses palpated. No hepatosplenomegaly. Bowel sounds positive.  Musculoskeletal: no clubbing / cyanosis. No joint deformity upper and lower extremities. Good ROM, no contractures. Normal muscle tone.  Skin: no rashes, lesions, ulcers. No induration Neurologic: CN 2-12 grossly intact. Sensation intact, DTR normal. Strength 5/5 in all 4.  Psychiatric: Normal judgment and insight. Alert and oriented x 3. Normal mood.    Subsequent Medicare wellness visit   1. Risk factors, based on past  M,S,F -cardiovascular disease risk factors include age, history of hyperlipidemia, history of hypertension   2.  Physical activities: She  is very sedentary   3.  Depression/mood:  She has a history of depression but feels like her mood is stable   4.  Hearing:  No perceived issues   5.  ADL's: Independent in all ADLs   6.  Fall risk:  She is currently scoring as a moderate fall risk due to a fall last year.   7.  Home safety: No problems identified   8.  Height weight, and visual acuity: height and weight as above, vision:   Visual Acuity Screening   Right eye Left eye Both eyes  Without correction:     With correction: '20/20 20/20 20/20 '     9.  Counseling:  Advised that she increase physical activity to at least 30 minutes 3 days a week of moderate intensity aerobic  activity.  Advised that she update her eye and dental exams and obtain her shingles vaccine at her pharmacy.   10. Lab orders based on risk factors: Laboratory update will be reviewed   11. Referral :  None today   12. Care plan:  Follow-up with me in 6 months   13. Cognitive assessment:  No cognitive impairment identified despite her complaints.   14. Screening: Patient provided with a written and personalized 5-10 year screening schedule in the AVS.   yes   15. Provider List Update:   PCP, cardiology  16. Advance Directives: Full code   [facility] Visit from 05/05/2020 in [facility] [place]  PHQ-9 Total Score 3      Fall Risk  05/05/2020  Falls in the past year? 1  Number falls in past yr: 1  Injury with Fall? 1     Impression and Plan:  Welcome to Medicare preventive visit -She has been advised routine eye and dental care. -She needs her shingles vaccine, otherwise immunizations are up-to-date. -Screening labs today. -Healthy lifestyle discussed in detail. -She is overdue for breast and colon cancer screening, appropriate referrals have been made. -Initial DEXA scan requested. -She had a Pap smear in 2019 will be due again in 2023.  Encounter for screening mammogram for malignant neoplasm of breast  - Plan: MM Digital Screening  Encounter for osteoporosis screening in asymptomatic postmenopausal patient  - Plan: DG Bone Density  Screening for malignant neoplasm of colon  - Plan: Ambulatory referral to Gastroenterology  Primary hypertension  -Well-controlled.  Mild intermittent asthma without complication -Currently well controlled, followed by pulmonary.  Obstructive sleep apnea -On nightly CPAP  Hypothyroidism, unspecified type  - Plan: TSH  Hyperlipidemia, unspecified hyperlipidemia type  - Plan: Lipid panel -Last LDL was 144 in May 2020, she is currently on atorvastatin daily.  Vitamin D deficiency  B12 deficiency Fatigue,  unspecified type -Check B12 and vitamin D levels today, she is not currently taking supplementation, suspect fatigue may be in part due to this or uncontrolled hypothyroidism.  DEPRESSIVE DSORDER, RCR, FULL REMISSION -Mood is stable, continue Wellbutrin, Celexa.    Patient Instructions   -Nice seeing you today!!  -Lab work today; will notify you once results are available.  -Shingles vaccine at pharmacy.  -Remember to schedule eye and dental care.  -Schedule follow up in 6 months.   Preventive Care 72-19 Years Old, Female Preventive care refers to lifestyle choices and visits with your health care provider that can promote health and wellness. This includes:  A yearly physical exam. This is also called an annual wellness visit.  Regular dental and eye exams.  Immunizations.  Screening  for certain conditions.  Healthy lifestyle choices, such as: ? Eating a healthy diet. ? Getting regular exercise. ? Not using drugs or products that contain nicotine and tobacco. ? Limiting alcohol use. What can I expect for my preventive care visit? Physical exam Your health care provider will check your:  Height and weight. These may be used to calculate your BMI (body mass index). BMI is a measurement that tells if you are at a healthy weight.  Heart rate and blood pressure.  Body temperature.  Skin for abnormal spots. Counseling Your health care provider may ask you questions about your:  Past medical problems.  Family's medical history.  Alcohol, tobacco, and drug use.  Emotional well-being.  Home life and relationship well-being.  Sexual activity.  Diet, exercise, and sleep habits.  Work and work Statistician.  Access to firearms.  Method of birth control.  Menstrual cycle.  Pregnancy history. What immunizations do I need? Vaccines are usually given at various ages, according to a schedule. Your health care provider will recommend vaccines for you based on  your age, medical history, and lifestyle or other factors, such as travel or where you work.   What tests do I need? Blood tests  Lipid and cholesterol levels. These may be checked every 5 years, or more often if you are over 66 years old.  Hepatitis C test.  Hepatitis B test. Screening  Lung cancer screening. You may have this screening every year starting at age 74 if you have a 30-pack-year history of smoking and currently smoke or have quit within the past 15 years.  Colorectal cancer screening. ? All adults should have this screening starting at age 77 and continuing until age 37. ? Your health care provider may recommend screening at age 38 if you are at increased risk. ? You will have tests every 1-10 years, depending on your results and the type of screening test.  Diabetes screening. ? This is done by checking your blood sugar (glucose) after you have not eaten for a while (fasting). ? You may have this done every 1-3 years.  Mammogram. ? This may be done every 1-2 years. ? Talk with your health care provider about when you should start having regular mammograms. This may depend on whether you have a family history of breast cancer.  BRCA-related cancer screening. This may be done if you have a family history of breast, ovarian, tubal, or peritoneal cancers.  Pelvic exam and Pap test. ? This may be done every 3 years starting at age 21. ? Starting at age 104, this may be done every 5 years if you have a Pap test in combination with an HPV test. Other tests  STD (sexually transmitted disease) testing, if you are at risk.  Bone density scan. This is done to screen for osteoporosis. You may have this scan if you are at high risk for osteoporosis. Talk with your health care provider about your test results, treatment options, and if necessary, the need for more tests. Follow these instructions at home: Eating and drinking  Eat a diet that includes fresh fruits and  vegetables, whole grains, lean protein, and low-fat dairy products.  Take vitamin and mineral supplements as recommended by your health care provider.  Do not drink alcohol if: ? Your health care provider tells you not to drink. ? You are pregnant, may be pregnant, or are planning to become pregnant.  If you drink alcohol: ? Limit how much you have to 0-1  drink a day. ? Be aware of how much alcohol is in your drink. In the [place] one drink equals one 12 oz bottle of beer (355 mL), one 5 oz glass of wine (148 mL), or one 1 oz glass of hard liquor (44 mL).   Lifestyle  Take daily care of your teeth and gums. Brush your teeth every morning and night with fluoride toothpaste. Floss one time each day.  Stay active. Exercise for at least 30 minutes 5 or more days each week.  Do not use any products that contain nicotine or tobacco, such as cigarettes, e-cigarettes, and chewing tobacco. If you need help quitting, ask your health care provider.  Do not use drugs.  If you are sexually active, practice safe sex. Use a condom or other form of protection to prevent STIs (sexually transmitted infections).  If you do not wish to become pregnant, use a form of birth control. If you plan to become pregnant, see your health care provider for a prepregnancy visit.  If told by your health care provider, take low-dose aspirin daily starting at age 62.  Find healthy ways to cope with stress, such as: ? Meditation, yoga, or listening to music. ? Journaling. ? Talking to a trusted person. ? Spending time with friends and family. Safety  Always wear your seat belt while driving or riding in a vehicle.  Do not drive: ? If you have been drinking alcohol. Do not ride with someone who has been drinking. ? When you are tired or distracted. ? While texting.  Wear a helmet and other protective equipment during sports activities.  If you have firearms in your house, make sure you follow all gun safety  procedures. What's next?  Visit your health care provider once a year for an annual wellness visit.  Ask your health care provider how often you should have your eyes and teeth checked.  Stay up to date on all vaccines. This information is not intended to replace advice given to you by your health care provider. Make sure you discuss any questions you have with your health care provider. Document Revised: 01/05/2020 Document Reviewed: 12/12/2017 Elsevier Patient Education  2021 [place], [place] [facility]

## 2020-05-05 NOTE — Patient Instructions (Signed)
-Nice seeing you today!!  -Lab work today; will notify you once results are available.  -Shingles vaccine at pharmacy.  -Remember to schedule eye and dental care.  -Schedule follow up in 6 months.   Preventive Care 70-66 Years Old, Female Preventive care refers to lifestyle choices and visits with your health care provider that can promote health and wellness. This includes:  A yearly physical exam. This is also called an annual wellness visit.  Regular dental and eye exams.  Immunizations.  Screening for certain conditions.  Healthy lifestyle choices, such as: ? Eating a healthy diet. ? Getting regular exercise. ? Not using drugs or products that contain nicotine and tobacco. ? Limiting alcohol use. What can I expect for my preventive care visit? Physical exam Your health care provider will check your:  Height and weight. These may be used to calculate your BMI (body mass index). BMI is a measurement that tells if you are at a healthy weight.  Heart rate and blood pressure.  Body temperature.  Skin for abnormal spots. Counseling Your health care provider may ask you questions about your:  Past medical problems.  Family's medical history.  Alcohol, tobacco, and drug use.  Emotional well-being.  Home life and relationship well-being.  Sexual activity.  Diet, exercise, and sleep habits.  Work and work Statistician.  Access to firearms.  Method of birth control.  Menstrual cycle.  Pregnancy history. What immunizations do I need? Vaccines are usually given at various ages, according to a schedule. Your health care provider will recommend vaccines for you based on your age, medical history, and lifestyle or other factors, such as travel or where you work.   What tests do I need? Blood tests  Lipid and cholesterol levels. These may be checked every 5 years, or more often if you are over 88 years old.  Hepatitis C test.  Hepatitis B  test. Screening  Lung cancer screening. You may have this screening every year starting at age 15 if you have a 30-pack-year history of smoking and currently smoke or have quit within the past 15 years.  Colorectal cancer screening. ? All adults should have this screening starting at age 84 and continuing until age 82. ? Your health care provider may recommend screening at age 78 if you are at increased risk. ? You will have tests every 1-10 years, depending on your results and the type of screening test.  Diabetes screening. ? This is done by checking your blood sugar (glucose) after you have not eaten for a while (fasting). ? You may have this done every 1-3 years.  Mammogram. ? This may be done every 1-2 years. ? Talk with your health care provider about when you should start having regular mammograms. This may depend on whether you have a family history of breast cancer.  BRCA-related cancer screening. This may be done if you have a family history of breast, ovarian, tubal, or peritoneal cancers.  Pelvic exam and Pap test. ? This may be done every 3 years starting at age 42. ? Starting at age 8, this may be done every 5 years if you have a Pap test in combination with an HPV test. Other tests  STD (sexually transmitted disease) testing, if you are at risk.  Bone density scan. This is done to screen for osteoporosis. You may have this scan if you are at high risk for osteoporosis. Talk with your health care provider about your test results, treatment options, and if necessary,  the need for more tests. Follow these instructions at home: Eating and drinking  Eat a diet that includes fresh fruits and vegetables, whole grains, lean protein, and low-fat dairy products.  Take vitamin and mineral supplements as recommended by your health care provider.  Do not drink alcohol if: ? Your health care provider tells you not to drink. ? You are pregnant, may be pregnant, or are planning  to become pregnant.  If you drink alcohol: ? Limit how much you have to 0-1 drink a day. ? Be aware of how much alcohol is in your drink. In the [place] one drink equals one 12 oz bottle of beer (355 mL), one 5 oz glass of wine (148 mL), or one 1 oz glass of hard liquor (44 mL).   Lifestyle  Take daily care of your teeth and gums. Brush your teeth every morning and night with fluoride toothpaste. Floss one time each day.  Stay active. Exercise for at least 30 minutes 5 or more days each week.  Do not use any products that contain nicotine or tobacco, such as cigarettes, e-cigarettes, and chewing tobacco. If you need help quitting, ask your health care provider.  Do not use drugs.  If you are sexually active, practice safe sex. Use a condom or other form of protection to prevent STIs (sexually transmitted infections).  If you do not wish to become pregnant, use a form of birth control. If you plan to become pregnant, see your health care provider for a prepregnancy visit.  If told by your health care provider, take low-dose aspirin daily starting at age 50.  Find healthy ways to cope with stress, such as: ? Meditation, yoga, or listening to music. ? Journaling. ? Talking to a trusted person. ? Spending time with friends and family. Safety  Always wear your seat belt while driving or riding in a vehicle.  Do not drive: ? If you have been drinking alcohol. Do not ride with someone who has been drinking. ? When you are tired or distracted. ? While texting.  Wear a helmet and other protective equipment during sports activities.  If you have firearms in your house, make sure you follow all gun safety procedures. What's next?  Visit your health care provider once a year for an annual wellness visit.  Ask your health care provider how often you should have your eyes and teeth checked.  Stay up to date on all vaccines. This information is not intended to replace advice given to you  by your health care provider. Make sure you discuss any questions you have with your health care provider. Document Revised: 01/05/2020 Document Reviewed: 12/12/2017 Elsevier Patient Education  2021 [employer]  

## 2020-05-06 ENCOUNTER — Other Ambulatory Visit: Payer: Self-pay | Admitting: Internal Medicine

## 2020-05-06 DIAGNOSIS — E559 Vitamin D deficiency, unspecified: Secondary | ICD-10-CM

## 2020-05-06 DIAGNOSIS — E785 Hyperlipidemia, unspecified: Secondary | ICD-10-CM

## 2020-05-06 MED ORDER — CYANOCOBALAMIN 1000 MCG/ML IJ SOLN
1000.0000 ug | INTRAMUSCULAR | 2 refills | Status: DC
Start: 2020-05-06 — End: 2021-06-12

## 2020-05-06 MED ORDER — "BD SAFETYGLIDE SYRINGE/NEEDLE 25G X 1"" 3 ML MISC": 11 refills | Status: AC

## 2020-05-06 MED ORDER — ATORVASTATIN CALCIUM 40 MG PO TABS
40.0000 mg | ORAL_TABLET | Freq: Every day | ORAL | 1 refills | Status: DC
Start: 2020-05-06 — End: 2020-10-28

## 2020-05-28 ENCOUNTER — Other Ambulatory Visit: Payer: Self-pay | Admitting: Internal Medicine

## 2020-05-28 DIAGNOSIS — E559 Vitamin D deficiency, unspecified: Secondary | ICD-10-CM

## 2020-07-31 ENCOUNTER — Other Ambulatory Visit: Payer: Self-pay | Admitting: Internal Medicine

## 2020-07-31 DIAGNOSIS — I10 Essential (primary) hypertension: Secondary | ICD-10-CM

## 2020-07-31 DIAGNOSIS — E039 Hypothyroidism, unspecified: Secondary | ICD-10-CM

## 2020-07-31 DIAGNOSIS — F411 Generalized anxiety disorder: Secondary | ICD-10-CM

## 2020-08-16 ENCOUNTER — Other Ambulatory Visit: Payer: Self-pay | Admitting: Internal Medicine

## 2020-08-16 ENCOUNTER — Encounter: Payer: Self-pay | Admitting: Internal Medicine

## 2020-08-16 DIAGNOSIS — E559 Vitamin D deficiency, unspecified: Secondary | ICD-10-CM

## 2020-08-23 ENCOUNTER — Other Ambulatory Visit: Payer: Self-pay | Admitting: Internal Medicine

## 2020-08-23 DIAGNOSIS — Z1231 Encounter for screening mammogram for malignant neoplasm of breast: Secondary | ICD-10-CM

## 2020-09-06 ENCOUNTER — Other Ambulatory Visit: Payer: Self-pay

## 2020-09-13 ENCOUNTER — Other Ambulatory Visit: Payer: Self-pay

## 2020-09-19 ENCOUNTER — Other Ambulatory Visit: Payer: Self-pay

## 2020-09-19 ENCOUNTER — Encounter: Payer: Self-pay | Admitting: Family Medicine

## 2020-09-19 ENCOUNTER — Ambulatory Visit (INDEPENDENT_AMBULATORY_CARE_PROVIDER_SITE_OTHER): Payer: Medicare Other | Admitting: Family Medicine

## 2020-09-19 VITALS — BP 128/80 | HR 74 | Temp 98.2°F | Ht 66.0 in | Wt 237.6 lb

## 2020-09-19 DIAGNOSIS — L509 Urticaria, unspecified: Secondary | ICD-10-CM | POA: Diagnosis not present

## 2020-09-19 NOTE — Patient Instructions (Signed)
Hives Hives (urticaria) are itchy, red, swollen areas on the skin. Hives can appear on any part of the body. Hives often fade within 24 hours (acute hives). Sometimes, new hives appear after old ones fade and the cycle can continue for several days or weeks (chronic hives). Hives do not spread from person to person (are not contagious). Hives come from the body's reaction to something a person is allergic to (allergen), something that causes irritation, or various other triggers. When a person is exposed to a trigger, his or her body releases a chemical (histamine) that causes redness, itching, and swelling. Hives can appear right after exposure to a trigger or hours later. What are the causes? This condition may be caused by:  Allergies to foods or ingredients.  Insect bites or stings.  Exposure to pollen or pets.  Contact with latex or chemicals.  Spending time in sunlight, heat, or cold (exposure).  Exercise.  Stress.  Certain medicines. You can also get hives from other medical conditions and treatments, such as:  Viruses, including the common cold.  Bacterial infections, such as urinary tract infections and strep throat.  Certain medicines.  Allergy shots.  Blood transfusions. Sometimes, the cause of this condition is not known (idiopathic hives). What increases the risk? You are more likely to develop this condition if you:  Are a woman.  Have food allergies, especially to citrus fruits, milk, eggs, peanuts, tree nuts, or shellfish.  Are allergic to: ? Medicines. ? Latex. ? Insects. ? Animals. ? Pollen. What are the signs or symptoms? Common symptoms of this condition include raised, itchy, red or white bumps or patches on your skin. These areas may:  Become large and swollen (welts).  Change in shape and location, quickly and repeatedly.  Be separate hives or connect over a large area of skin.  Sting or become painful.  Turn white when pressed in the  center (blanch). In severe cases, yourhands, feet, and face may also become swollen. This may occur if hives develop deeper in your skin.   How is this diagnosed? This condition may be diagnosed by your symptoms, medical history, and physical exam.  Your skin, urine, or blood may be tested to find out what is causing your hives and to rule out other health issues.  Your health care provider may also remove a small sample of skin from the affected area and examine it under a microscope (biopsy). How is this treated? Treatment for this condition depends on the cause and severity of your symptoms. Your health care provider may recommend using cool, wet cloths (cool compresses) or taking cool showers to relieve itching. Treatment may include:  Medicines that help: ? Relieve itching (antihistamines). ? Reduce swelling (corticosteroids). ? Treat infection (antibiotics).  An injectable medicine (omalizumab). Your health care provider may prescribe this if you have chronic idiopathic hives and you continue to have symptoms even after treatment with antihistamines. Severe cases may require an emergency injection of adrenaline (epinephrine) to prevent a life-threatening allergic reaction (anaphylaxis). Follow these instructions at home: Medicines  Take and apply over-the-counter and prescription medicines only as told by your health care provider.  If you were prescribed an antibiotic medicine, take it as told by your health care provider. Do not stop using the antibiotic even if you start to feel better. Skin care  Apply cool compresses to the affected areas.  Do not scratch or rub your skin. General instructions  Do not take hot showers or baths. This can  make itching worse.  Do not wear tight-fitting clothing.  Use sunscreen and wear protective clothing when you are outside.  Avoid any substances that cause your hives. Keep a journal to help track what causes your hives. Write  down: ? What medicines you take. ? What you eat and drink. ? What products you use on your skin.  Keep all follow-up visits as told by your health care provider. This is important. Contact a health care provider if:  Your symptoms are not controlled with medicine.  Your joints are painful or swollen. Get help right away if:  You have a fever.  You have pain in your abdomen.  Your tongue or lips are swollen.  Your eyelids are swollen.  Your chest or throat feels tight.  You have trouble breathing or swallowing. These symptoms may represent a serious problem that is an emergency. Do not wait to see if the symptoms will go away. Get medical help right away. Call your local emergency services (911 in the [place] Do not drive yourself to the hospital. Summary  Hives (urticaria) are itchy, red, swollen areas on your skin. Hives come from the body's reaction to something a person is allergic to (allergen), something that causes irritation, or various other triggers.  Treatment for this condition depends on the cause and severity of your symptoms.  Avoid any substances that cause your hives. Keep a journal to help track what causes your hives.  Take and apply over-the-counter and prescription medicines only as told by your health care provider.  Keep all follow-up visits as told by your health care provider. This is important. This information is not intended to replace advice given to you by your health care provider. Make sure you discuss any questions you have with your health care provider. Document Revised: 10/16/2017 Document Reviewed: 10/16/2017 Elsevier Patient Education  [place] with Benadryl at night  Consider OTC Xyzal or Allegra one daily for itching  May also Pepcid 20 mg twice daily.

## 2020-09-19 NOTE — Progress Notes (Signed)
Established Patient Office Visit  Subjective:  Patient ID: Erika [name], female    DOB: Nov 18, 1955  Age: 65 y.o. MRN: [id number]  CC:  Chief Complaint  Patient presents with  . Rash    Patient complains of itchy rash noted along her chin, arms and left ear, right leg x3 weeks, used Cortisone 10 and Benadryl cream, suspected fleas as she has a new dog, house insecticide used with no relief    HPI Erika [name] presents for evaluation of some urticaria on her upper and lower extremities.  Present for about 3 weeks.  They do have a new puppy but got the puppy 2 months ago.  They initially thought this may be due to something such as fleas but they have not actually seen any fleas and had health treated couple months ago and have not seen any improvement since then.  She is taken some Benadryl but this makes her very sleepy at night.  Denies any change of medications.  No new soaps or detergents.  No history of food allergy.  Past Medical History:  Diagnosis Date  . Anxiety   . Arthritis   . History of cardiac murmur as a child   . Menopausal syndrome   . Mild asthma    no inhalers since stopped smoking  . Neuroma of foot   . OSA on CPAP   . Wears glasses     Past Surgical History:  Procedure Laterality Date  . ELBOW SURGERY Right 2002   repair tendon and nerve injury  . EXCISION MORTON'S NEUROMA Left 2010  . ROBOTIC ASSISTED TOTAL HYSTERECTOMY WITH BILATERAL SALPINGO OOPHERECTOMY N/A 12/31/2017   Procedure: XI ROBOTIC ASSISTED TOTAL HYSTERECTOMY WITH BILATERAL SALPINGO OOPHORECTOMY;  Surgeon: Everitt Amber, MD;  Location: [facility];  Service: Gynecology;  Laterality: N/A;  . SHOULDER ARTHROSCOPY W/ ROTATOR CUFF REPAIR Left 09/2016   and burectomy    Family History  Problem Relation Age of Onset  . Diabetes Mother   . Heart disease Mother   . Diabetes Maternal Grandmother   . Diabetes Maternal Grandfather   . Asthma Sister   . Cancer Paternal Grandmother   . Cancer Maternal  Uncle   . Uterine cancer Maternal Uncle   . Cancer Maternal Aunt   . Cancer Brother     Social History   Socioeconomic History  . Marital status: Married    Spouse name: Not on file  . Number of children: Not on file  . Years of education: Not on file  . Highest education level: Not on file  Occupational History  . Occupation: Solicitor: [employer]  Tobacco Use  . Smoking status: Former Smoker    Packs/day: 1.00    Years: 30.00    Pack years: 30.00    Types: Cigarettes    Quit date: 06/01/2000    Years since quitting: 20.3  . Smokeless tobacco: Never Used  Vaping Use  . Vaping Use: Never used  Substance and Sexual Activity  . Alcohol use: No  . Drug use: Never  . Sexual activity: Not Currently    Birth control/protection: Post-menopausal  Other Topics Concern  . Not on file  Social History Narrative  . Not on file   Social Determinants of Health   Financial Resource Strain: Not on file  Food Insecurity: Not on file  Transportation Needs: Not on file  Physical Activity: Not on file  Stress: Not on file  Social Connections:  Not on file  Intimate Partner Violence: Not on file    Outpatient Medications Prior to Visit  Medication Sig Dispense Refill  . acetaminophen (TYLENOL) 650 MG CR tablet Take 650-1,300 mg by mouth every 8 (eight) hours as needed for pain.    Marland Kitchen atorvastatin (LIPITOR) 40 MG tablet Take 1 tablet (40 mg total) by mouth daily. 90 tablet 1  . buPROPion (WELLBUTRIN XL) 150 MG 24 hr tablet TAKE 1 TABLET BY MOUTH EVERY DAY 90 tablet 1  . cetirizine (ZYRTEC) 10 MG tablet Take 10 mg by mouth daily as needed for allergies. (Patient not taking: Reported on 05/05/2020)    . Cholecalciferol (VITAMIN D3) 50 MCG (2000 UT) CHEW Chew 2,000 Units by mouth daily.     . citalopram (CELEXA) 40 MG tablet Take 2 tablets (80 mg total) by mouth at bedtime. (Patient taking differently: Take 80 mg by mouth at bedtime. Insurance may not cover 90 days)  60 tablet 1  . cyanocobalamin (,VITAMIN B-12,) 1000 MCG/ML injection Inject 1 mL (1,000 mcg total) into the muscle every 30 (thirty) days. 18 mL 2  . hydrochlorothiazide (HYDRODIURIL) 25 MG tablet TAKE 1 TABLET BY MOUTH EVERY DAY 90 tablet 1  . ibuprofen (ADVIL,MOTRIN) 200 MG tablet Take 600 mg by mouth every 6 (six) hours as needed for moderate pain.    Marland Kitchen levothyroxine (SYNTHROID) 75 MCG tablet TAKE 1 TABLET BY MOUTH DAILY BEFORE BREAKFAST. 90 tablet 1  . SYRINGE-NEEDLE, DISP, 3 ML (BD SAFETYGLIDE SYRINGE/NEEDLE) 25G X 1" 3 ML MISC Use for B12 injections 100 each 11   No facility-administered medications prior to visit.    Allergies  Allergen Reactions  . Tetracycline Hcl Rash    ROS Review of Systems  Constitutional: Negative for chills and fever.  HENT: Negative for sore throat.   Respiratory: Negative for cough and shortness of breath.   Cardiovascular: Negative for chest pain.  Hematological: Negative for adenopathy.      Objective:    Physical Exam Vitals reviewed.  Constitutional:      Appearance: Normal appearance.  Cardiovascular:     Rate and Rhythm: Normal rate and regular rhythm.  Pulmonary:     Effort: Pulmonary effort is normal.     Breath sounds: Normal breath sounds.  Musculoskeletal:     Cervical back: Neck supple.  Lymphadenopathy:     Cervical: No cervical adenopathy.  Skin:    Comments: She has some scattered raised urticarial lesions on her upper extremities including forearms and arms and a couple on her posterior neck and lower legs  Neurological:     Mental Status: She is alert.     BP 128/80 (BP Location: Left Arm, Patient Position: Sitting, Cuff Size: Large)   Pulse 74   Temp 98.2 F (36.8 C) (Oral)   Ht 5\' 6"  (1.676 m)   Wt 237 lb 9.6 oz (107.8 kg)   BMI 38.35 kg/m  Wt Readings from Last 3 Encounters:  09/19/20 237 lb 9.6 oz (107.8 kg)  05/05/20 233 lb 11.2 oz (106 kg)  03/17/20 236 lb 9.6 oz (107.3 kg)     Health Maintenance  Due  Topic Date Due  . Pneumococcal Vaccine 4-58 Years old (1 of 4 - PCV13) Never done  . Zoster Vaccines- Shingrix (1 of 2) Never done  . DEXA SCAN  Never done    There are no preventive care reminders to display for this patient.  Lab Results  Component Value Date   TSH 2.80 05/05/2020  Lab Results  Component Value Date   WBC 8.0 05/05/2020   HGB 13.2 05/05/2020   HCT 40.1 05/05/2020   MCV 79.9 05/05/2020   PLT 366.0 05/05/2020   Lab Results  Component Value Date   NA 137 05/05/2020   K 3.8 05/05/2020   CO2 27 05/05/2020   GLUCOSE 94 05/05/2020   BUN 20 05/05/2020   CREATININE 0.85 05/05/2020   BILITOT 0.3 05/05/2020   ALKPHOS 75 05/05/2020   AST 19 05/05/2020   ALT 18 05/05/2020   PROT 8.0 05/05/2020   ALBUMIN 4.5 05/05/2020   CALCIUM 10.1 05/05/2020   ANIONGAP 12 02/17/2020   GFR 71.93 05/05/2020   Lab Results  Component Value Date   CHOL 254 (H) 05/05/2020   Lab Results  Component Value Date   HDL 60.30 05/05/2020   Lab Results  Component Value Date   LDLCALC 162 (H) 05/05/2020   Lab Results  Component Value Date   TRIG 159.0 (H) 05/05/2020   Lab Results  Component Value Date   CHOLHDL 4 05/05/2020   Lab Results  Component Value Date   HGBA1C 5.7 05/05/2020      Assessment & Plan:   Problem List Items Addressed This Visit   None   Visit Diagnoses    Urticaria    -  Primary    Patient has classic urticarial type lesions.  Etiology unclear. -Avoid excessive heat -Continue Benadryl at night -Recommend less sedating antihistamine for daytime use such as Allegra or Xyzal -Consider H2 blocker such as Pepcid 20 mg twice daily -If none of the above are helping consider prednisone short-term  No orders of the defined types were placed in this encounter.   Follow-up: No follow-ups on file.    Carolann Littler, MD

## 2020-09-22 ENCOUNTER — Other Ambulatory Visit (INDEPENDENT_AMBULATORY_CARE_PROVIDER_SITE_OTHER): Payer: Medicare Other

## 2020-09-22 ENCOUNTER — Other Ambulatory Visit: Payer: Self-pay

## 2020-09-22 DIAGNOSIS — E785 Hyperlipidemia, unspecified: Secondary | ICD-10-CM | POA: Diagnosis not present

## 2020-09-22 DIAGNOSIS — E559 Vitamin D deficiency, unspecified: Secondary | ICD-10-CM

## 2020-09-22 LAB — LIPID PANEL
Cholesterol: 155 mg/dL (ref 0–200)
HDL: 61.7 mg/dL (ref >39.00)
LDL Cholesterol: 71 mg/dL (ref 0–99)
NonHDL: 92.81
Total CHOL/HDL Ratio: 3
Triglycerides: 110 mg/dL (ref 0.0–149.0)
VLDL: 22 mg/dL (ref 0.0–40.0)

## 2020-09-22 LAB — VITAMIN D 25 HYDROXY (VIT D DEFICIENCY, FRACTURES): VITD: 42.48 ng/mL (ref 30.00–100.00)

## 2020-09-23 ENCOUNTER — Ambulatory Visit (INDEPENDENT_AMBULATORY_CARE_PROVIDER_SITE_OTHER): Payer: Medicare Other | Admitting: Internal Medicine

## 2020-09-23 ENCOUNTER — Encounter: Payer: Self-pay | Admitting: Internal Medicine

## 2020-09-23 VITALS — BP 110/80 | HR 80 | Temp 98.0°F | Wt 236.8 lb

## 2020-09-23 DIAGNOSIS — L509 Urticaria, unspecified: Secondary | ICD-10-CM | POA: Diagnosis not present

## 2020-09-23 MED ORDER — PREDNISONE 10 MG (21) PO TBPK: ORAL_TABLET | ORAL | 0 refills | Status: DC

## 2020-09-23 NOTE — Progress Notes (Signed)
Acute office Visit     This visit occurred during the SARS-CoV-2 public health emergency.  Safety protocols were in place, including screening questions prior to the visit, additional usage of staff PPE, and extensive cleaning of exam room while observing appropriate contact time as indicated for disinfecting solutions.    CC/Reason for Visit: Urticaria  HPI: Erika [name] is a 65 y.o. female who is coming in today for the above mentioned reasons.  She has been dealing with this urticarial type rash now for about 6 weeks.  She saw another provider in this office on June 6 and was told to take Benadryl and a nondrowsy antihistamine as well as cortisone lotion and Pepcid which she has been with some relief but not complete relief.  It was mentioned at that visit that if she had no resolution we could possibly attempt a prednisone taper and this is why she is here today.  These lesions are mainly on her arms and upper chest but she has had them on her face, neck as well as her legs.  No new medications, soaps, lotions, shampoos, laundry detergents.  Past Medical/Surgical History: Past Medical History:  Diagnosis Date   Anxiety    Arthritis    History of cardiac murmur as a child    Menopausal syndrome    Mild asthma    no inhalers since stopped smoking   Neuroma of foot    OSA on CPAP    Wears glasses     Past Surgical History:  Procedure Laterality Date   ELBOW SURGERY Right 2002   repair tendon and nerve injury   EXCISION MORTON'S NEUROMA Left 2010   ROBOTIC ASSISTED TOTAL HYSTERECTOMY WITH BILATERAL SALPINGO OOPHERECTOMY N/A 12/31/2017   Procedure: XI ROBOTIC ASSISTED TOTAL HYSTERECTOMY WITH BILATERAL SALPINGO OOPHORECTOMY;  Surgeon: Everitt Amber, MD;  Location: [facility];  Service: Gynecology;  Laterality: N/A;   SHOULDER ARTHROSCOPY W/ ROTATOR CUFF REPAIR Left 09/2016   and burectomy    Social History:  reports that she quit smoking about 20 years ago. Her smoking use  included cigarettes. She has a 30.00 pack-year smoking history. She has never used smokeless tobacco. She reports that she does not drink alcohol and does not use drugs.  Allergies: Allergies  Allergen Reactions   Tetracycline Hcl Rash    Family History:  Family History  Problem Relation Age of Onset   Diabetes Mother    Heart disease Mother    Diabetes Maternal Grandmother    Diabetes Maternal Grandfather    Asthma Sister    Cancer Paternal Grandmother    Cancer Maternal Uncle    Uterine cancer Maternal Uncle    Cancer Maternal Aunt    Cancer Brother      Current Outpatient Medications:    acetaminophen (TYLENOL) 650 MG CR tablet, Take 650-1,300 mg by mouth every 8 (eight) hours as needed for pain., Disp: , Rfl:    atorvastatin (LIPITOR) 40 MG tablet, Take 1 tablet (40 mg total) by mouth daily., Disp: 90 tablet, Rfl: 1   buPROPion (WELLBUTRIN XL) 150 MG 24 hr tablet, TAKE 1 TABLET BY MOUTH EVERY DAY, Disp: 90 tablet, Rfl: 1   cetirizine (ZYRTEC) 10 MG tablet, Take 10 mg by mouth daily as needed for allergies., Disp: , Rfl:    Cholecalciferol (VITAMIN D3) 50 MCG (2000 UT) CHEW, Chew 2,000 Units by mouth daily. , Disp: , Rfl:    citalopram (CELEXA) 40 MG tablet, Take 2 tablets (80  mg total) by mouth at bedtime. (Patient taking differently: Take 80 mg by mouth at bedtime. Insurance may not cover 90 days), Disp: 60 tablet, Rfl: 1   cyanocobalamin (,VITAMIN B-12,) 1000 MCG/ML injection, Inject 1 mL (1,000 mcg total) into the muscle every 30 (thirty) days., Disp: 18 mL, Rfl: 2   hydrochlorothiazide (HYDRODIURIL) 25 MG tablet, TAKE 1 TABLET BY MOUTH EVERY DAY, Disp: 90 tablet, Rfl: 1   ibuprofen (ADVIL,MOTRIN) 200 MG tablet, Take 600 mg by mouth every 6 (six) hours as needed for moderate pain., Disp: , Rfl:    levothyroxine (SYNTHROID) 75 MCG tablet, TAKE 1 TABLET BY MOUTH DAILY BEFORE BREAKFAST., Disp: 90 tablet, Rfl: 1   predniSONE (STERAPRED UNI-PAK 21 TAB) 10 MG (21) TBPK tablet,  Take as directed, Disp: 21 tablet, Rfl: 0   SYRINGE-NEEDLE, DISP, 3 ML (BD SAFETYGLIDE SYRINGE/NEEDLE) 25G X 1" 3 ML MISC, Use for B12 injections, Disp: 100 each, Rfl: 11  Review of Systems:  Constitutional: Denies fever, chills, diaphoresis, appetite change and fatigue.  HEENT: Denies photophobia, eye pain, redness, hearing loss, ear pain, congestion, sore throat, rhinorrhea, sneezing, mouth sores, trouble swallowing, neck pain, neck stiffness and tinnitus.   Respiratory: Denies SOB, DOE, cough, chest tightness,  and wheezing.   Cardiovascular: Denies chest pain, palpitations and leg swelling.  Gastrointestinal: Denies nausea, vomiting, abdominal pain, diarrhea, constipation, blood in stool and abdominal distention.  Genitourinary: Denies dysuria, urgency, frequency, hematuria, flank pain and difficulty urinating.  Endocrine: Denies: hot or cold intolerance, sweats, changes in hair or nails, polyuria, polydipsia. Musculoskeletal: Denies myalgias, back pain, joint swelling, arthralgias and gait problem.  Skin: Denies pallor wound.  Neurological: Denies dizziness, seizures, syncope, weakness, light-headedness, numbness and headaches.  Hematological: Denies adenopathy. Easy bruising, personal or family bleeding history  Psychiatric/Behavioral: Denies suicidal ideation, mood changes, confusion, nervousness, sleep disturbance and agitation    Physical Exam: Vitals:   09/23/20 0755  BP: 110/80  Pulse: 80  Temp: 98 F (36.7 C)  TempSrc: Oral  SpO2: 99%  Weight: 236 lb 12.8 oz (107.4 kg)    Body mass index is 38.22 kg/m.   Constitutional: NAD, calm, comfortable Eyes: PERRL, lids and conjunctivae normal, wears corrective lenses ENMT: Mucous membranes are moist.  Skin: Urticarial type lesions on arms and upper chest Neurologic: Grossly intact and nonfocal Psychiatric: Normal judgment and insight. Alert and oriented x 3. Normal mood.    Impression and Plan:  Urticaria  -I will  send her for prednisone taper, if no improvement can consider referral to allergy for skin patch testing.     Lelon Frohlich, MD [facility]

## 2020-10-11 ENCOUNTER — Other Ambulatory Visit: Payer: Self-pay | Admitting: Internal Medicine

## 2020-10-11 DIAGNOSIS — F3342 Major depressive disorder, recurrent, in full remission: Secondary | ICD-10-CM

## 2020-10-19 ENCOUNTER — Ambulatory Visit: Payer: Medicare Other

## 2020-10-28 ENCOUNTER — Other Ambulatory Visit: Payer: Self-pay | Admitting: Internal Medicine

## 2020-11-03 ENCOUNTER — Encounter: Payer: Medicare Other | Admitting: Internal Medicine

## 2020-11-07 ENCOUNTER — Other Ambulatory Visit: Payer: Self-pay

## 2020-11-08 ENCOUNTER — Encounter: Payer: Self-pay | Admitting: Internal Medicine

## 2020-11-08 ENCOUNTER — Ambulatory Visit (INDEPENDENT_AMBULATORY_CARE_PROVIDER_SITE_OTHER): Payer: Medicare Other | Admitting: Internal Medicine

## 2020-11-08 VITALS — BP 110/70 | HR 77 | Temp 98.3°F | Wt 236.6 lb

## 2020-11-08 DIAGNOSIS — I10 Essential (primary) hypertension: Secondary | ICD-10-CM | POA: Diagnosis not present

## 2020-11-08 DIAGNOSIS — R0609 Other forms of dyspnea: Secondary | ICD-10-CM

## 2020-11-08 DIAGNOSIS — R0600 Dyspnea, unspecified: Secondary | ICD-10-CM

## 2020-11-08 DIAGNOSIS — G4733 Obstructive sleep apnea (adult) (pediatric): Secondary | ICD-10-CM

## 2020-11-08 DIAGNOSIS — E039 Hypothyroidism, unspecified: Secondary | ICD-10-CM | POA: Diagnosis not present

## 2020-11-08 DIAGNOSIS — J4520 Mild intermittent asthma, uncomplicated: Secondary | ICD-10-CM

## 2020-11-08 DIAGNOSIS — E785 Hyperlipidemia, unspecified: Secondary | ICD-10-CM | POA: Diagnosis not present

## 2020-11-08 DIAGNOSIS — I700 Atherosclerosis of aorta: Secondary | ICD-10-CM

## 2020-11-08 NOTE — Progress Notes (Signed)
Established Patient Office Visit     This visit occurred during the SARS-CoV-2 public health emergency.  Safety protocols were in place, including screening questions prior to the visit, additional usage of staff PPE, and extensive cleaning of exam room while observing appropriate contact time as indicated for disinfecting solutions.    CC/Reason for Visit: Follow-up chronic conditions, discuss dyspnea   HPI: Erika [name] is a 65 y.o. female who is coming in today for the above mentioned reasons. Past Medical History is significant for: hypertension, hyperlipidemia, vitamin B12 deficiency, asthma, obstructive sleep apnea on nightly CPAP, hypothyroidism and depression.  Since I last saw her in January, she was restarted on Lipitor and has had significant improvement in her lipid profile with a total cholesterol of 155, triglycerides 110 and LDL 71.  She tells me that for the past 4 to 5 weeks she has been experiencing dyspnea on exertion and chest tightness especially when she is walking or doing housework.  She had been attributing this to the heat.   Past Medical/Surgical History: Past Medical History:  Diagnosis Date   Anxiety    Arthritis    History of cardiac murmur as a child    Menopausal syndrome    Mild asthma    no inhalers since stopped smoking   Neuroma of foot    OSA on CPAP    Wears glasses     Past Surgical History:  Procedure Laterality Date   ELBOW SURGERY Right 2002   repair tendon and nerve injury   EXCISION MORTON'S NEUROMA Left 2010   ROBOTIC ASSISTED TOTAL HYSTERECTOMY WITH BILATERAL SALPINGO OOPHERECTOMY N/A 12/31/2017   Procedure: XI ROBOTIC ASSISTED TOTAL HYSTERECTOMY WITH BILATERAL SALPINGO OOPHORECTOMY;  Surgeon: Everitt Amber, MD;  Location: [facility];  Service: Gynecology;  Laterality: N/A;   SHOULDER ARTHROSCOPY W/ ROTATOR CUFF REPAIR Left 09/2016   and burectomy    Social History:  reports that she quit smoking about 20 years ago. Her smoking  use included cigarettes. She has a 30.00 pack-year smoking history. She has never used smokeless tobacco. She reports that she does not drink alcohol and does not use drugs.  Allergies: Allergies  Allergen Reactions   Tetracycline    Tetracycline Hcl Rash    Family History:  Family History  Problem Relation Age of Onset   Diabetes Mother    Heart disease Mother    Diabetes Maternal Grandmother    Diabetes Maternal Grandfather    Asthma Sister    Cancer Paternal Grandmother    Cancer Maternal Uncle    Uterine cancer Maternal Uncle    Cancer Maternal Aunt    Cancer Brother      Current Outpatient Medications:    acetaminophen (TYLENOL) 650 MG CR tablet, Take 650-1,300 mg by mouth every 8 (eight) hours as needed for pain., Disp: , Rfl:    atorvastatin (LIPITOR) 40 MG tablet, TAKE 1 TABLET BY MOUTH EVERY DAY, Disp: 90 tablet, Rfl: 1   buPROPion (WELLBUTRIN XL) 150 MG 24 hr tablet, TAKE 1 TABLET BY MOUTH EVERY DAY, Disp: 90 tablet, Rfl: 1   cetirizine (ZYRTEC) 10 MG tablet, Take 10 mg by mouth daily as needed for allergies., Disp: , Rfl:    Cholecalciferol (VITAMIN D3) 50 MCG (2000 UT) CHEW, Chew 2,000 Units by mouth daily. , Disp: , Rfl:    citalopram (CELEXA) 40 MG tablet, TAKE 2 TABLETS (80 MG TOTAL) BY MOUTH AT BEDTIME., Disp: 180 tablet, Rfl: 1   cyanocobalamin (,  VITAMIN B-12,) 1000 MCG/ML injection, Inject 1 mL (1,000 mcg total) into the muscle every 30 (thirty) days., Disp: 18 mL, Rfl: 2   hydrochlorothiazide (HYDRODIURIL) 25 MG tablet, TAKE 1 TABLET BY MOUTH EVERY DAY, Disp: 90 tablet, Rfl: 1   ibuprofen (ADVIL,MOTRIN) 200 MG tablet, Take 600 mg by mouth every 6 (six) hours as needed for moderate pain., Disp: , Rfl:    levothyroxine (SYNTHROID) 75 MCG tablet, TAKE 1 TABLET BY MOUTH DAILY BEFORE BREAKFAST., Disp: 90 tablet, Rfl: 1   SYRINGE-NEEDLE, DISP, 3 ML (BD SAFETYGLIDE SYRINGE/NEEDLE) 25G X 1" 3 ML MISC, Use for B12 injections, Disp: 100 each, Rfl: 11  Review of Systems:   Constitutional: Denies fever, chills, diaphoresis, appetite change and fatigue.  HEENT: Denies photophobia, eye pain, redness, hearing loss, ear pain, congestion, sore throat, rhinorrhea, sneezing, mouth sores, trouble swallowing, neck pain, neck stiffness and tinnitus.   Respiratory: Denies cough, chest tightness,  and wheezing.   Cardiovascular: Denies palpitations and leg swelling.  Gastrointestinal: Denies nausea, vomiting, abdominal pain, diarrhea, constipation, blood in stool and abdominal distention.  Genitourinary: Denies dysuria, urgency, frequency, hematuria, flank pain and difficulty urinating.  Endocrine: Denies: hot or cold intolerance, sweats, changes in hair or nails, polyuria, polydipsia. Musculoskeletal: Denies myalgias, back pain, joint swelling, arthralgias and gait problem.  Skin: Denies pallor, rash and wound.  Neurological: Denies dizziness, seizures, syncope, weakness, light-headedness, numbness and headaches.  Hematological: Denies adenopathy. Easy bruising, personal or family bleeding history  Psychiatric/Behavioral: Denies suicidal ideation, mood changes, confusion, nervousness, sleep disturbance and agitation    Physical Exam: Vitals:   11/08/20 0756  BP: 110/70  Pulse: 77  Temp: 98.3 F (36.8 C)  TempSrc: Oral  SpO2: 98%  Weight: 236 lb 9.6 oz (107.3 kg)    Body mass index is 38.19 kg/m.   Constitutional: NAD, calm, comfortable Eyes: PERRL, lids and conjunctivae normal, wears corrective lenses ENMT: Mucous membranes are moist.  Respiratory: clear to auscultation bilaterally, no wheezing, no crackles. Normal respiratory effort. No accessory muscle use.  Cardiovascular: Regular rate and rhythm, no murmurs / rubs / gallops. No extremity edema.  Neurologic: Grossly intact and nonfocal Psychiatric: Normal judgment and insight. Alert and oriented x 3. Normal mood.    Impression and Plan:  DOE (dyspnea on exertion)  - Plan: ECHOCARDIOGRAM COMPLETE,  Ambulatory referral to Cardiology, EKG 12-Lead -EKG performed in office today and interpreted by myself: Normal sinus rhythm at a rate of 70, normal axis, no acute ST or T wave changes. -Am concerned that her dyspnea on exertion could be an anginal equivalent. -She does have multiple CAD risk factors including obesity, hypertension, hyperlipidemia, she was also diagnosed with atherosclerosis of the aorta per prior imaging test. -Refer to cardiology for further work-up.  Primary hypertension - -Blood pressures currently well controlled on hydrochlorothiazide only.  Hyperlipidemia, unspecified hyperlipidemia type -Continue atorvastatin 40 mg daily.  Hypothyroidism, unspecified type -On levothyroxine, follow-up 15.8 in January.  Mild intermittent asthma without complication -Lungs are clear on auscultation today, do not believe her asthma is the reason for her shortness of breath.  Obstructive sleep apnea -Noted, on nightly CPAP.  Time spent: 35 minutes reviewing chart, interviewing and examining patient and formulating plan of care.    Lelon Frohlich, MD [facility]

## 2021-01-02 ENCOUNTER — Emergency Department (HOSPITAL_COMMUNITY): Payer: Medicare Other

## 2021-01-02 ENCOUNTER — Other Ambulatory Visit: Payer: Self-pay

## 2021-01-02 ENCOUNTER — Observation Stay (HOSPITAL_COMMUNITY)
Admission: EM | Admit: 2021-01-02 | Discharge: 2021-01-04 | Disposition: A | Discharge status: Home / Self Care | Payer: Medicare Other | Attending: Internal Medicine | Admitting: Internal Medicine

## 2021-01-02 ENCOUNTER — Encounter (HOSPITAL_COMMUNITY): Payer: Self-pay | Admitting: Emergency Medicine

## 2021-01-02 DIAGNOSIS — R2990 Unspecified symptoms and signs involving the nervous system: Secondary | ICD-10-CM

## 2021-01-02 DIAGNOSIS — J45909 Unspecified asthma, uncomplicated: Secondary | ICD-10-CM | POA: Diagnosis present

## 2021-01-02 DIAGNOSIS — R531 Weakness: Secondary | ICD-10-CM | POA: Diagnosis present

## 2021-01-02 DIAGNOSIS — Z20822 Contact with and (suspected) exposure to covid-19: Secondary | ICD-10-CM | POA: Diagnosis not present

## 2021-01-02 DIAGNOSIS — R059 Cough, unspecified: Secondary | ICD-10-CM

## 2021-01-02 DIAGNOSIS — Z79899 Other long term (current) drug therapy: Secondary | ICD-10-CM | POA: Insufficient documentation

## 2021-01-02 DIAGNOSIS — Z87891 Personal history of nicotine dependence: Secondary | ICD-10-CM | POA: Insufficient documentation

## 2021-01-02 DIAGNOSIS — G4733 Obstructive sleep apnea (adult) (pediatric): Secondary | ICD-10-CM | POA: Diagnosis present

## 2021-01-02 DIAGNOSIS — F3342 Major depressive disorder, recurrent, in full remission: Secondary | ICD-10-CM | POA: Diagnosis present

## 2021-01-02 DIAGNOSIS — F411 Generalized anxiety disorder: Secondary | ICD-10-CM | POA: Diagnosis present

## 2021-01-02 DIAGNOSIS — E785 Hyperlipidemia, unspecified: Secondary | ICD-10-CM | POA: Diagnosis present

## 2021-01-02 DIAGNOSIS — I110 Hypertensive heart disease with heart failure: Secondary | ICD-10-CM | POA: Diagnosis not present

## 2021-01-02 DIAGNOSIS — E039 Hypothyroidism, unspecified: Secondary | ICD-10-CM | POA: Diagnosis present

## 2021-01-02 DIAGNOSIS — I509 Heart failure, unspecified: Secondary | ICD-10-CM | POA: Diagnosis not present

## 2021-01-02 DIAGNOSIS — R29810 Facial weakness: Secondary | ICD-10-CM | POA: Diagnosis present

## 2021-01-02 DIAGNOSIS — I10 Essential (primary) hypertension: Secondary | ICD-10-CM | POA: Diagnosis present

## 2021-01-02 DIAGNOSIS — R1310 Dysphagia, unspecified: Secondary | ICD-10-CM

## 2021-01-02 DIAGNOSIS — G510 Bell's palsy: Principal | ICD-10-CM | POA: Insufficient documentation

## 2021-01-02 LAB — COMPREHENSIVE METABOLIC PANEL
ALT: 21 U/L (ref 0–44)
AST: 22 U/L (ref 15–41)
Albumin: 3.8 g/dL (ref 3.5–5.0)
Alkaline Phosphatase: 78 U/L (ref 38–126)
Anion gap: 12 (ref 5–15)
BUN: 14 mg/dL (ref 8–23)
CO2: 27 mmol/L (ref 22–32)
Calcium: 9.6 mg/dL (ref 8.9–10.3)
Chloride: 100 mmol/L (ref 98–111)
Creatinine, Ser: 0.81 mg/dL (ref 0.44–1.00)
GFR, Estimated: >60 mL/min (ref >60)
Glucose, Bld: 138 mg/dL — ABNORMAL HIGH (ref 70–99)
Potassium: 3.6 mmol/L (ref 3.5–5.1)
Sodium: 139 mmol/L (ref 135–145)
Total Bilirubin: 0.5 mg/dL (ref 0.3–1.2)
Total Protein: 7 g/dL (ref 6.5–8.1)

## 2021-01-02 LAB — CBC
HCT: 41 % (ref 36.0–46.0)
Hemoglobin: 12.9 g/dL (ref 12.0–15.0)
MCH: 26.8 pg (ref 26.0–34.0)
MCHC: 31.5 g/dL (ref 30.0–36.0)
MCV: 85.2 fL (ref 80.0–100.0)
Platelets: 326 10*3/uL (ref 150–400)
RBC: 4.81 MIL/uL (ref 3.87–5.11)
RDW: 14.7 % (ref 11.5–15.5)
WBC: 7.6 10*3/uL (ref 4.0–10.5)
nRBC: 0 % (ref 0.0–0.2)

## 2021-01-02 MED ORDER — STROKE: EARLY STAGES OF RECOVERY BOOK
Freq: Once | Status: DC
  Filled 2021-01-02: qty 1

## 2021-01-02 MED ORDER — SENNOSIDES-DOCUSATE SODIUM 8.6-50 MG PO TABS: 1.0000 | ORAL_TABLET | Freq: Every evening | ORAL | Status: DC | PRN

## 2021-01-02 MED ORDER — ENOXAPARIN SODIUM 40 MG/0.4ML IJ SOSY
40.0000 mg | PREFILLED_SYRINGE | INTRAMUSCULAR | Status: DC
  Administered 2021-01-02 – 2021-01-03 (×2): 40 mg via SUBCUTANEOUS
  Filled 2021-01-02 (×2): qty 0.4

## 2021-01-02 MED ORDER — ACETAMINOPHEN 650 MG RE SUPP: 650.0000 mg | RECTAL | Status: DC | PRN

## 2021-01-02 MED ORDER — ACETAMINOPHEN 325 MG PO TABS
650.0000 mg | ORAL_TABLET | ORAL | Status: DC | PRN
  Administered 2021-01-02 – 2021-01-03 (×4): 650 mg via ORAL
  Filled 2021-01-02 (×5): qty 2

## 2021-01-02 MED ORDER — POLYVINYL ALCOHOL 1.4 % OP SOLN
1.0000 [drp] | Freq: Every day | OPHTHALMIC | Status: DC
  Administered 2021-01-02 – 2021-01-04 (×6): 1 [drp] via OPHTHALMIC
  Filled 2021-01-02: qty 15

## 2021-01-02 MED ORDER — LORAZEPAM 2 MG/ML IJ SOLN
1.0000 mg | Freq: Four times a day (QID) | INTRAMUSCULAR | Status: AC | PRN
Start: 2021-01-02 — End: 2021-01-02
  Administered 2021-01-02: 1 mg via INTRAVENOUS
  Filled 2021-01-02: qty 1

## 2021-01-02 MED ORDER — ACETAMINOPHEN 160 MG/5ML PO SOLN: 650.0000 mg | ORAL | Status: DC | PRN

## 2021-01-02 MED ORDER — IOHEXOL 350 MG/ML SOLN
50.0000 mL | Freq: Once | INTRAVENOUS | Status: AC | PRN
  Administered 2021-01-02: 50 mL via INTRAVENOUS

## 2021-01-02 NOTE — H&P (Addendum)
History and Physical    Erika [name] YQI:[id number] DOB: 02-07-1956 DOA: 01/02/2021  PCP: Isaac Bliss, Rayford Halsted, MD Consultants:  none Patient coming from:  Home - lives with husband   Chief Complaint: right facial droop/dysphagia  HPI: Erika [name] is a 65 y.o. female with medical history significant of HTN,HLD, depression/anxiety    She states on Friday she thought she had covid,but had negative tests.  She had lost her taste, headache, hard time swallowing, ear pain. She states the right side of her mouth would smack if she ate or drank and had drooling from the right side of her mouth. She also had drooping in her eye. Symtpoms have not improved. She denies any right sided upper or extremity weakness. When she walks she is going to the left. She has no slurred speech. She also has dysphagia. When she tries to swallow it gets stuck in her esophagus and it's hard to get it down, with both food and liquid. Acts like she gets choked at times.   Denies fever/chills, chest pain, palpitations, shortness of breath, cough, abdominal pain, N/V/D, leg swelling. Denies double or blurry vision, but feels like she has to strain more with her right eye.   Risk factors for stroke include hyperlipidemia, HTN, past hx of smoking. CVA in maternal grandfather.   LKW: Thursday evening   ED Course: vitals: Afebrile, blood pressure 158/90, heart rate 78, respiratory rate 18, oxygen 100% on room air. Pertinent labs: MRI brain with no acute infarction hemorrhage or mass.  CTA head with no acute intracranial abnormality.  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  Neurology consulted.  SLP saw patient for bedside swallow eval.  TRH was asked to admit.  Review of Systems: As per HPI; otherwise review of systems reviewed and negative.   Ambulatory Status:  Ambulates without assistance   Past Medical History:  Diagnosis Date   Anxiety    Arthritis    History of  cardiac murmur as a child    Menopausal syndrome    Mild asthma    no inhalers since stopped smoking   Neuroma of foot    OSA on CPAP    Wears glasses     Past Surgical History:  Procedure Laterality Date   ELBOW SURGERY Right 2002   repair tendon and nerve injury   EXCISION MORTON'S NEUROMA Left 2010   ROBOTIC ASSISTED TOTAL HYSTERECTOMY WITH BILATERAL SALPINGO OOPHERECTOMY N/A 12/31/2017   Procedure: XI ROBOTIC ASSISTED TOTAL HYSTERECTOMY WITH BILATERAL SALPINGO OOPHORECTOMY;  Surgeon: Everitt Amber, MD;  Location: [facility];  Service: Gynecology;  Laterality: N/A;   SHOULDER ARTHROSCOPY W/ ROTATOR CUFF REPAIR Left 09/2016   and burectomy    Social History   Socioeconomic History   Marital status: Married    Spouse name: Not on file   Number of children: Not on file   Years of education: Not on file   Highest education level: Not on file  Occupational History   Occupation: Solicitor: [employer]  Tobacco Use   Smoking status: Former    Packs/day: 1.00    Years: 30.00    Pack years: 30.00    Types: Cigarettes    Quit date: 06/01/2000    Years since quitting: 20.6   Smokeless tobacco: Never  Vaping Use   Vaping Use: Never used  Substance and Sexual Activity   Alcohol use: No  Drug use: Never   Sexual activity: Not Currently    Birth control/protection: Post-menopausal  Other Topics Concern   Not on file  Social History Narrative   Not on file   Social Determinants of Health   Financial Resource Strain: Not on file  Food Insecurity: Not on file  Transportation Needs: Not on file  Physical Activity: Not on file  Stress: Not on file  Social Connections: Not on file  Intimate Partner Violence: Not on file    Allergies  Allergen Reactions   Tape Other (See Comments)    PATIENT PREFERS CLOTH TAPE- THE OTHERS DON'T AGREE WITH HER SKIN   Tetracycline Rash   Tetracycline Hcl Rash    Family History  Problem Relation Age of Onset    Diabetes Mother    Heart disease Mother    Diabetes Maternal Grandmother    Diabetes Maternal Grandfather    Asthma Sister    Cancer Paternal Grandmother    Cancer Maternal Uncle    Uterine cancer Maternal Uncle    Cancer Maternal Aunt    Cancer Brother     Prior to Admission medications   Medication Sig Start Date End Date Taking? Authorizing Provider  acetaminophen (TYLENOL) 650 MG CR tablet Take 650-1,300 mg by mouth every 8 (eight) hours as needed for pain.   Yes [provider]  atorvastatin (LIPITOR) 40 MG tablet TAKE 1 TABLET BY MOUTH EVERY DAY Patient taking differently: Take 40 mg by mouth at bedtime. 10/28/20  Yes Isaac Bliss, Rayford Halsted, MD  buPROPion (WELLBUTRIN XL) 150 MG 24 hr tablet TAKE 1 TABLET BY MOUTH EVERY DAY Patient taking differently: Take 150 mg by mouth at bedtime. 08/01/20  Yes Isaac Bliss, Rayford Halsted, MD  cetirizine (ZYRTEC) 10 MG tablet Take 10 mg by mouth daily as needed for allergies.   Yes [provider]  Cholecalciferol (VITAMIN D3 SUPER STRENGTH) 50 MCG (2000 UT) CAPS Take 2,000 Units by mouth.   Yes [provider]  citalopram (CELEXA) 40 MG tablet TAKE 2 TABLETS (80 MG TOTAL) BY MOUTH AT BEDTIME. 10/11/20  Yes Isaac Bliss, Rayford Halsted, MD  cyanocobalamin (,VITAMIN B-12,) 1000 MCG/ML injection Inject 1 mL (1,000 mcg total) into the muscle every 30 (thirty) days. 05/06/20  Yes Isaac Bliss, Rayford Halsted, MD  hydrochlorothiazide (HYDRODIURIL) 25 MG tablet TAKE 1 TABLET BY MOUTH EVERY DAY Patient taking differently: Take 25 mg by mouth at bedtime. 08/01/20  Yes Isaac Bliss, Rayford Halsted, MD  ibuprofen (ADVIL,MOTRIN) 200 MG tablet Take 600-800 mg by mouth every 6 (six) hours as needed (for headaches).   Yes [provider]  levothyroxine (SYNTHROID) 75 MCG tablet TAKE 1 TABLET BY MOUTH DAILY BEFORE BREAKFAST. Patient taking differently: Take 75 mcg by mouth daily before breakfast. 08/01/20  Yes Isaac Bliss, Rayford Halsted, MD  SYRINGE-NEEDLE, DISP, 3 ML (BD SAFETYGLIDE SYRINGE/NEEDLE) 25G X 1" 3 ML MISC Use for B12 injections 05/06/20   Isaac Bliss, Rayford Halsted, MD    Physical Exam: Vitals:   01/02/21 1006 01/02/21 1119 01/02/21 1844 01/02/21 1945  BP: (!) 158/90 114/71 129/63 (!) 113/59  Pulse: 78 68 70 65  Resp: 18 15 14    Temp: 98.1 F (36.7 C)     TempSrc: Oral     SpO2: 100% 99% 97% 94%     General:  Appears calm and comfortable and is in NAD Eyes:  PERRL, EOMI, normal lids, iris ENT:  grossly normal hearing, lips & tongue, mmm; appropriate dentition Neck:  no LAD, masses or thyromegaly; no carotid bruits Cardiovascular:  RRR, no m/r/g. No LE edema.  Respiratory:   CTA bilaterally with no wheezes/rales/rhonchi.  Normal respiratory effort. Abdomen:  soft, NT, ND, NABS Back:   normal alignment, no CVAT Skin:  no rash or induration seen on limited exam Musculoskeletal:  grossly normal tone BUE/BLE, good ROM, no bony abnormality Lower extremity:  No LE edema.  Limited foot exam with no ulcerations.  2+ distal pulses. Psychiatric:  grossly normal mood and affect, speech fluent and appropriate, AOx3 Neurologic:  CN 2-12 grossly intact except she has right sided facial weakness. moves all extremities in coordinated fashion, sensation intact. HKS intact bilaterally. DTR 2+. Gait deferred.     Radiological Exams on Admission: Independently reviewed - see discussion in A/P where applicable  CT Angio Head W or Wo Contrast  Result Date: 01/02/2021 CLINICAL DATA:  Right facial droop, vision changes EXAM: CT ANGIOGRAPHY HEAD AND NECK TECHNIQUE: Multidetector CT imaging of the head and neck was performed using the standard protocol during bolus administration of intravenous contrast. Multiplanar CT image reconstructions and MIPs were obtained to evaluate the vascular anatomy. Carotid stenosis measurements (when applicable) are obtained utilizing NASCET criteria, using the distal internal carotid  diameter as the denominator. CONTRAST:  54mL OMNIPAQUE IOHEXOL 350 MG/ML SOLN COMPARISON:  None. FINDINGS: CT HEAD Brain: There is no acute intracranial hemorrhage, mass effect, or edema. Gray-white differentiation is preserved. There is no extra-axial fluid collection. Ventricles and sulci are within normal limits in size and configuration. Vascular: Better evaluated on CTA portion. Skull: Calvarium is unremarkable. Sinuses/Orbits: No acute finding. Other: None. Review of the MIP images confirms the above findings CTA NECK Aortic arch: Calcified plaque along the arch and patent great vessel origins. No significant proximal subclavian stenosis. Right carotid system: Patent. Mixed plaque along the proximal internal carotid with less than 50% stenosis. Left carotid system: Patent. Mixed plaque along the proximal internal carotid with less than 50% stenosis. Vertebral arteries: Patent.  Codominant.  No stenosis. Skeleton: Mild cervical spine degenerative changes. Other neck: Unremarkable. Upper chest: Unremarkable. Review of the MIP images confirms the above findings CTA HEAD Anterior circulation: Intracranial internal carotid arteries are patent with mild calcified plaque. Anterior and middle cerebral arteries are patent. There is an inferiorly directed outpouching from the anterior temporal branch of the left M1 MCA measuring approximately 3 mm base to apex with a 2 mm neck. Posterior circulation: Intracranial vertebral arteries are patent. Basilar artery is patent. Major cerebellar artery origins are patent. Bilateral posterior communicating arteries are present. Posterior cerebral arteries are patent. Fetal origin right PCA. Venous sinuses: Patent as allowed by contrast bolus timing. Review of the MIP images confirms the above findings IMPRESSION: No acute intracranial abnormality. No large vessel occlusion or hemodynamically significant stenosis. No evidence of dissection. 3 mm aneurysm at the origin of the  anterior temporal branch from the left M1 MCA. Electronically Signed   By: Macy Mis M.D.   On: 01/02/2021 15:40   CT Angio Neck W and/or Wo Contrast  Result Date: 01/02/2021 CLINICAL DATA:  Right facial droop, vision changes EXAM: CT ANGIOGRAPHY HEAD AND NECK TECHNIQUE: Multidetector CT imaging of the head and neck was performed using the standard protocol during bolus administration of intravenous contrast. Multiplanar CT image reconstructions and MIPs were obtained to evaluate the vascular anatomy. Carotid stenosis measurements (when applicable) are obtained utilizing NASCET criteria, using the distal internal carotid diameter as the denominator. CONTRAST:  59mL OMNIPAQUE IOHEXOL  350 MG/ML SOLN COMPARISON:  None. FINDINGS: CT HEAD Brain: There is no acute intracranial hemorrhage, mass effect, or edema. Gray-white differentiation is preserved. There is no extra-axial fluid collection. Ventricles and sulci are within normal limits in size and configuration. Vascular: Better evaluated on CTA portion. Skull: Calvarium is unremarkable. Sinuses/Orbits: No acute finding. Other: None. Review of the MIP images confirms the above findings CTA NECK Aortic arch: Calcified plaque along the arch and patent great vessel origins. No significant proximal subclavian stenosis. Right carotid system: Patent. Mixed plaque along the proximal internal carotid with less than 50% stenosis. Left carotid system: Patent. Mixed plaque along the proximal internal carotid with less than 50% stenosis. Vertebral arteries: Patent.  Codominant.  No stenosis. Skeleton: Mild cervical spine degenerative changes. Other neck: Unremarkable. Upper chest: Unremarkable. Review of the MIP images confirms the above findings CTA HEAD Anterior circulation: Intracranial internal carotid arteries are patent with mild calcified plaque. Anterior and middle cerebral arteries are patent. There is an inferiorly directed outpouching from the anterior temporal  branch of the left M1 MCA measuring approximately 3 mm base to apex with a 2 mm neck. Posterior circulation: Intracranial vertebral arteries are patent. Basilar artery is patent. Major cerebellar artery origins are patent. Bilateral posterior communicating arteries are present. Posterior cerebral arteries are patent. Fetal origin right PCA. Venous sinuses: Patent as allowed by contrast bolus timing. Review of the MIP images confirms the above findings IMPRESSION: No acute intracranial abnormality. No large vessel occlusion or hemodynamically significant stenosis. No evidence of dissection. 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 Electronically Signed   By: Macy Mis M.D.   On: 01/02/2021 15:40   MR BRAIN WO CONTRAST  Result Date: 01/02/2021 CLINICAL DATA:  Neuro deficit, acute, stroke suspected facial droop EXAM: MRI HEAD WITHOUT CONTRAST TECHNIQUE: Multiplanar, multiecho pulse sequences of the brain and surrounding structures were obtained without intravenous contrast. COMPARISON:  None. FINDINGS: Brain: There is no acute infarction or intracranial hemorrhage. There is no intracranial mass, mass effect, or edema. There is no hydrocephalus or extra-axial fluid collection. Ventricles and sulci are within normal limits in size and configuration. Minimal foci of T2 hyperintensity in the supratentorial white matter are nonspecific but may reflect minor chronic microvascular ischemic changes. Vascular: Major vessel flow voids at the skull base are preserved. Skull and upper cervical spine: Normal marrow signal is preserved. Sinuses/Orbits: Paranasal sinuses are aerated. Orbits are unremarkable. Other: Sella is unremarkable.  Mastoid air cells are clear. IMPRESSION: No acute infarction, hemorrhage, or mass. Electronically Signed   By: Macy Mis M.D.   On: 01/02/2021 15:22    EKG: Independently reviewed.  NSR with rate 64; nonspecific ST changes with no evidence of  acute ischemia   Labs on Admission: I have personally reviewed the available labs and imaging studies at the time of the admission.  Pertinent labs:  No pertinent labs     Assessment/Plan Principal Problem:   Facial droop -65 year old female with multiple risk factors for stroke including hypertension hyperlipidemia, age resenting with right-sided facial weakness and dysphagia complaints. -Neurology consulted by EDP who after further history believed this was to be more a Bell's palsy.  -eye drops and patch to eye overnight  -MRI with no acute findings -with history of unsteady gait and ataxia, will leave on obs and have PT consult before going home.  -place in observation on telemetry  -Neurochecks per protocol -PT consult    Active  Problems: 4mm aneurysm -discussed with neurosurgery.  They will consult on patient but feel she can be followed outpatient with Dr. Kathyrn Sheriff to discuss possible coiling.    Dysphagia -Speech-language pathologist is already done a bedside swallow eval.  Tolerated trials of ice chips, thin liquid, pure and solid textures.  Reported globus sensation and needed upper raising concerns for esophageal issues.   regular diet recommended for patient. -SLP will follow  Ataxic gait PT consult with recommendation     Hyperlipidemia -Continue Lipitor. -recent lipid panel in 6/22 showed LDL of 71    Hypertension -Well-controlled continue HCTZ continue    Hypothyroid -Continue home dose of Synthroid. -TSH within normal limits January 2022    DEPRESSIon and anxiety Continue home Cymbalta and Celexa.    Obstructive sleep apnea  -Continue home CPAP    There is no height or weight on file to calculate BMI.    Level of care: Telemetry Medical DVT prophylaxis:  Lovenox Code Status:  Full - confirmed with patient Family Communication: husband at bedside: Berdie Ogren Disposition Plan:  The patient is from: home Requires inpatient  hospitalization and is at significant risk of neurological worsening, requires constant monitoring, assessment and MDM with specialists.  Consults called: neurology by EDP   Admission status:  observation   Dragon dictation used in completing this note.    Orma Flaming MD [facility]   How to contact the [facility] Attending or Consulting provider [place] or covering provider during after hours [place], for this patient?  Check the care team in [facility] and look for a) attending/consulting [facility] provider listed and b) the [facility] team listed Log into [email or web address] and use [place]'s universal password to access. If you do not have the password, please contact the hospital operator. Locate the [facility] provider you are looking for under [facility] and page to a number that you can be directly reached. If you still have difficulty reaching the provider, please page the [facility] (Director on Call) for the Hospitalists listed on amion for assistance.   01/02/2021, 7:53 PM

## 2021-01-02 NOTE — ED Provider Notes (Signed)
Emergency Medicine Provider Triage Evaluation Note  Erika [name] , a 65 y.o. female  was evaluated in triage.  Pt complains of facial droop.  Symptoms since Friday or Saturday.  Has noticed change in taste as well as slight difficulty swallowing.  Also has had headache.  Had slight neck discomfort yesterday but no neck pain at present..  Review of Systems  Positive: Facial droop Negative: Arm or leg numbness or weakness, speech or vision changes  Physical Exam  BP (!) 158/90 (BP Location: Left Arm)   Pulse 78   Temp 98.1 F (36.7 C) (Oral)   Resp 18   SpO2 100%  Gen:   Awake, no distress   Resp:  Normal effort  MSK:   Moves extremities without difficulty  Other:  Awake alert and oriented x3, cranial nerves II through XII intact except for obvious right facial droop, visual fields intact, 5/5 strength in upper and lower extremities, sensation to light touch intact in upper and lower extremities  Medical Decision Making  Medically screening exam initiated at 10:21 AM.  Appropriate orders placed.  Erika [name] was informed that the remainder of the evaluation will be completed by another provider, this initial triage assessment does not replace that evaluation, and the importance of remaining in the ED until their evaluation is complete.  65 year old lady presented to ER with concern for right facial droop.  Based on physical exam, suspect most likely Bell's palsy.  However given associated headache, patient's age, medical comorbidities, we will be prudent to rule out acute stroke.  Will check MRI brain without contrast and basic blood work.   Erika Starch, MD 01/02/21 1024

## 2021-01-02 NOTE — ED Provider Notes (Signed)
[facility] EMERGENCY DEPARTMENT Provider Note   CSN: [id number] Arrival date & time: 01/02/21  1002     History No chief complaint on file.   Erika [name] is a 65 y.o. female.  Presents to the emergency room with concern for weakness in her face.  Symptoms started Friday or Saturday.  Initially noted some changes in her taste and then noted the weakness in her right face.  Feels like her eyelid is very heavy.  No change in her speech.  No change in vision, no numbness or weakness in her extremities.  Also noted some difficulty swallowing.  No prior history of stroke, not on blood thinners.  He does have history of hypertension, hyperlipidemia.  Former smoker.  HPI     Past Medical History:  Diagnosis Date   Anxiety    Arthritis    History of cardiac murmur as a child    Menopausal syndrome    Mild asthma    no inhalers since stopped smoking   Neuroma of foot    OSA on CPAP    Wears glasses     Patient Active Problem List   Diagnosis Date Noted   Facial droop 01/02/2021   Dysphagia 01/02/2021   Hypertension 02/18/2019   Hyperlipidemia 11/18/2018   Congestive heart failure of unknown etiology ([place]) 10/09/2018   Vitamin D deficiency 08/19/2018   Hypothyroid 08/01/2018   B12 deficiency 08/01/2018   Bilateral ovarian cysts 12/31/2017   Routine general medical examination at a health care facility 06/14/2014   Cough 05/04/2013   Fatigue 01/14/2012   Skin tag 08/23/2011   Obstructive sleep apnea 11/30/2008   Anxiety state 01/15/2008   DEPRESSIVE DSORDER, RCR, FULL REMISSION 11/28/2006   BENIGN POSITIONAL VERTIGO 11/21/2006   ALLERGIC RHINITIS 11/21/2006    Past Surgical History:  Procedure Laterality Date   ELBOW SURGERY Right 2002   repair tendon and nerve injury   EXCISION MORTON'S NEUROMA Left 2010   ROBOTIC ASSISTED TOTAL HYSTERECTOMY WITH BILATERAL SALPINGO OOPHERECTOMY N/A 12/31/2017   Procedure: XI ROBOTIC ASSISTED TOTAL HYSTERECTOMY  WITH BILATERAL SALPINGO OOPHORECTOMY;  Surgeon: Everitt Amber, MD;  Location: [facility];  Service: Gynecology;  Laterality: N/A;   SHOULDER ARTHROSCOPY W/ ROTATOR CUFF REPAIR Left 09/2016   and burectomy     OB History   No obstetric history on file.     Family History  Problem Relation Age of Onset   Diabetes Mother    Heart disease Mother    Diabetes Maternal Grandmother    Diabetes Maternal Grandfather    Asthma Sister    Cancer Paternal Grandmother    Cancer Maternal Uncle    Uterine cancer Maternal Uncle    Cancer Maternal Aunt    Cancer Brother     Social History   Tobacco Use   Smoking status: Former    Packs/day: 1.00    Years: 30.00    Pack years: 30.00    Types: Cigarettes    Quit date: 06/01/2000    Years since quitting: 20.6   Smokeless tobacco: Never  Vaping Use   Vaping Use: Never used  Substance Use Topics   Alcohol use: No   Drug use: Never    Home Medications Prior to Admission medications   Medication Sig Start Date End Date Taking? Authorizing Provider  acetaminophen (TYLENOL) 650 MG CR tablet Take 650-1,300 mg by mouth every 8 (eight) hours as needed for pain.   Yes [provider]  atorvastatin (LIPITOR) 40  MG tablet TAKE 1 TABLET BY MOUTH EVERY DAY Patient taking differently: Take 40 mg by mouth at bedtime. 10/28/20  Yes Isaac Bliss, Rayford Halsted, MD  buPROPion (WELLBUTRIN XL) 150 MG 24 hr tablet TAKE 1 TABLET BY MOUTH EVERY DAY Patient taking differently: Take 150 mg by mouth at bedtime. 08/01/20  Yes Isaac Bliss, Rayford Halsted, MD  cetirizine (ZYRTEC) 10 MG tablet Take 10 mg by mouth daily as needed for allergies.   Yes [provider]  Cholecalciferol (VITAMIN D3 SUPER STRENGTH) 50 MCG (2000 UT) CAPS Take 2,000 Units by mouth.   Yes [provider]  citalopram (CELEXA) 40 MG tablet TAKE 2 TABLETS (80 MG TOTAL) BY MOUTH AT BEDTIME. 10/11/20  Yes Isaac Bliss, Rayford Halsted, MD  cyanocobalamin (,VITAMIN B-12,) 1000 MCG/ML  injection Inject 1 mL (1,000 mcg total) into the muscle every 30 (thirty) days. 05/06/20  Yes Isaac Bliss, Rayford Halsted, MD  hydrochlorothiazide (HYDRODIURIL) 25 MG tablet TAKE 1 TABLET BY MOUTH EVERY DAY Patient taking differently: Take 25 mg by mouth at bedtime. 08/01/20  Yes Isaac Bliss, Rayford Halsted, MD  ibuprofen (ADVIL,MOTRIN) 200 MG tablet Take 600-800 mg by mouth every 6 (six) hours as needed (for headaches).   Yes [provider]  levothyroxine (SYNTHROID) 75 MCG tablet TAKE 1 TABLET BY MOUTH DAILY BEFORE BREAKFAST. Patient taking differently: Take 75 mcg by mouth daily before breakfast. 08/01/20  Yes Isaac Bliss, Rayford Halsted, MD  SYRINGE-NEEDLE, DISP, 3 ML (BD SAFETYGLIDE SYRINGE/NEEDLE) 25G X 1" 3 ML MISC Use for B12 injections 05/06/20   Isaac Bliss, Rayford Halsted, MD    Allergies    Tape, Tetracycline, and Tetracycline hcl  Review of Systems   Review of Systems  Constitutional:  Negative for chills and fever.  HENT:  Negative for ear pain and sore throat.   Eyes:  Negative for pain and visual disturbance.  Respiratory:  Negative for cough and shortness of breath.   Cardiovascular:  Negative for chest pain and palpitations.  Gastrointestinal:  Negative for abdominal pain and vomiting.  Genitourinary:  Negative for dysuria and hematuria.  Musculoskeletal:  Negative for arthralgias and back pain.  Skin:  Negative for color change and rash.  Neurological:  Positive for weakness. Negative for seizures and syncope.  All other systems reviewed and are negative.  Physical Exam Updated Vital Signs BP 114/71   Pulse 68   Temp 98.1 F (36.7 C) (Oral)   Resp 15   SpO2 99%   Physical Exam Vitals and nursing note reviewed.  Constitutional:      General: She is not in acute distress.    Appearance: She is well-developed.  HENT:     Head: Normocephalic and atraumatic.  Eyes:     Conjunctiva/sclera: Conjunctivae normal.  Cardiovascular:     Rate and Rhythm: Normal  rate and regular rhythm.     Heart sounds: No murmur heard. Pulmonary:     Effort: Pulmonary effort is normal. No respiratory distress.     Breath sounds: Normal breath sounds.  Abdominal:     Palpations: Abdomen is soft.     Tenderness: There is no abdominal tenderness.  Musculoskeletal:     Cervical back: Neck supple.  Skin:    General: Skin is warm and dry.  Neurological:     Mental Status: She is alert.     Comments: AAOx3 CN 2-12 intact, except for right facial droop 5/5 strength in b/l UE and LE Sensation to light touch intact in b/l UE  and LE Normal FNF Normal gait    ED Results / Procedures / Treatments   Labs (all labs ordered are listed, but only abnormal results are displayed) Labs Reviewed  COMPREHENSIVE METABOLIC PANEL - Abnormal; Notable for the following components:      Result Value   Glucose, Bld 138 (*)    All other components within normal limits  CBC    EKG EKG Interpretation  Date/Time:  Monday January 02 2021 11:31:32 EDT Ventricular Rate:  64 PR Interval:  180 QRS Duration: 98 QT Interval:  426 QTC Calculation: 439 R Axis:   35 Text Interpretation: Normal sinus rhythm Normal ECG Confirmed by Madalyn Rob [phone]) on 01/02/2021 11:40:18 AM  Radiology CT Angio Head W or Wo Contrast  Result Date: 01/02/2021 CLINICAL DATA:  Right facial droop, vision changes EXAM: CT ANGIOGRAPHY HEAD AND NECK TECHNIQUE: Multidetector CT imaging of the head and neck was performed using the standard protocol during bolus administration of intravenous contrast. Multiplanar CT image reconstructions and MIPs were obtained to evaluate the vascular anatomy. Carotid stenosis measurements (when applicable) are obtained utilizing NASCET criteria, using the distal internal carotid diameter as the denominator. CONTRAST:  86mL OMNIPAQUE IOHEXOL 350 MG/ML SOLN COMPARISON:  None. FINDINGS: CT HEAD Brain: There is no acute intracranial hemorrhage, mass effect, or edema.  Gray-white differentiation is preserved. There is no extra-axial fluid collection. Ventricles and sulci are within normal limits in size and configuration. Vascular: Better evaluated on CTA portion. Skull: Calvarium is unremarkable. Sinuses/Orbits: No acute finding. Other: None. Review of the MIP images confirms the above findings CTA NECK Aortic arch: Calcified plaque along the arch and patent great vessel origins. No significant proximal subclavian stenosis. Right carotid system: Patent. Mixed plaque along the proximal internal carotid with less than 50% stenosis. Left carotid system: Patent. Mixed plaque along the proximal internal carotid with less than 50% stenosis. Vertebral arteries: Patent.  Codominant.  No stenosis. Skeleton: Mild cervical spine degenerative changes. Other neck: Unremarkable. Upper chest: Unremarkable. Review of the MIP images confirms the above findings CTA HEAD Anterior circulation: Intracranial internal carotid arteries are patent with mild calcified plaque. Anterior and middle cerebral arteries are patent. There is an inferiorly directed outpouching from the anterior temporal branch of the left M1 MCA measuring approximately 3 mm base to apex with a 2 mm neck. Posterior circulation: Intracranial vertebral arteries are patent. Basilar artery is patent. Major cerebellar artery origins are patent. Bilateral posterior communicating arteries are present. Posterior cerebral arteries are patent. Fetal origin right PCA. Venous sinuses: Patent as allowed by contrast bolus timing. Review of the MIP images confirms the above findings IMPRESSION: No acute intracranial abnormality. No large vessel occlusion or hemodynamically significant stenosis. No evidence of dissection. 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 Electronically Signed   By: Macy Mis M.D.   On: 01/02/2021 15:40   CT Angio Neck W and/or Wo Contrast  Result Date: 01/02/2021 CLINICAL  DATA:  Right facial droop, vision changes EXAM: CT ANGIOGRAPHY HEAD AND NECK TECHNIQUE: Multidetector CT imaging of the head and neck was performed using the standard protocol during bolus administration of intravenous contrast. Multiplanar CT image reconstructions and MIPs were obtained to evaluate the vascular anatomy. Carotid stenosis measurements (when applicable) are obtained utilizing NASCET criteria, using the distal internal carotid diameter as the denominator. CONTRAST:  15mL OMNIPAQUE IOHEXOL 350 MG/ML SOLN COMPARISON:  None. FINDINGS: CT HEAD Brain: There is no acute intracranial hemorrhage,  mass effect, or edema. Gray-white differentiation is preserved. There is no extra-axial fluid collection. Ventricles and sulci are within normal limits in size and configuration. Vascular: Better evaluated on CTA portion. Skull: Calvarium is unremarkable. Sinuses/Orbits: No acute finding. Other: None. Review of the MIP images confirms the above findings CTA NECK Aortic arch: Calcified plaque along the arch and patent great vessel origins. No significant proximal subclavian stenosis. Right carotid system: Patent. Mixed plaque along the proximal internal carotid with less than 50% stenosis. Left carotid system: Patent. Mixed plaque along the proximal internal carotid with less than 50% stenosis. Vertebral arteries: Patent.  Codominant.  No stenosis. Skeleton: Mild cervical spine degenerative changes. Other neck: Unremarkable. Upper chest: Unremarkable. Review of the MIP images confirms the above findings CTA HEAD Anterior circulation: Intracranial internal carotid arteries are patent with mild calcified plaque. Anterior and middle cerebral arteries are patent. There is an inferiorly directed outpouching from the anterior temporal branch of the left M1 MCA measuring approximately 3 mm base to apex with a 2 mm neck. Posterior circulation: Intracranial vertebral arteries are patent. Basilar artery is patent. Major  cerebellar artery origins are patent. Bilateral posterior communicating arteries are present. Posterior cerebral arteries are patent. Fetal origin right PCA. Venous sinuses: Patent as allowed by contrast bolus timing. Review of the MIP images confirms the above findings IMPRESSION: No acute intracranial abnormality. No large vessel occlusion or hemodynamically significant stenosis. No evidence of dissection. 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 Electronically Signed   By: Macy Mis M.D.   On: 01/02/2021 15:40   MR BRAIN WO CONTRAST  Result Date: 01/02/2021 CLINICAL DATA:  Neuro deficit, acute, stroke suspected facial droop EXAM: MRI HEAD WITHOUT CONTRAST TECHNIQUE: Multiplanar, multiecho pulse sequences of the brain and surrounding structures were obtained without intravenous contrast. COMPARISON:  None. FINDINGS: Brain: There is no acute infarction or intracranial hemorrhage. There is no intracranial mass, mass effect, or edema. There is no hydrocephalus or extra-axial fluid collection. Ventricles and sulci are within normal limits in size and configuration. Minimal foci of T2 hyperintensity in the supratentorial white matter are nonspecific but may reflect minor chronic microvascular ischemic changes. Vascular: Major vessel flow voids at the skull base are preserved. Skull and upper cervical spine: Normal marrow signal is preserved. Sinuses/Orbits: Paranasal sinuses are aerated. Orbits are unremarkable. Other: Sella is unremarkable.  Mastoid air cells are clear. IMPRESSION: No acute infarction, hemorrhage, or mass. Electronically Signed   By: Macy Mis M.D.   On: 01/02/2021 15:22    Procedures Procedures   Medications Ordered in ED Medications  LORazepam (ATIVAN) injection 1 mg (1 mg Intravenous Given 01/02/21 1437)  iohexol (OMNIPAQUE) 350 MG/ML injection 50 mL (50 mLs Intravenous Contrast Given 01/02/21 1525)    ED Course  I have reviewed the  triage vital signs and the nursing notes.  Pertinent labs & imaging results that were available during my care of the patient were reviewed by me and considered in my medical decision making (see chart for details).    MDM Rules/Calculators/A&P                           65 year old lady presenting to ER with concern for right facial droop.  On exam only focal neurologic deficit appreciated was right facial droop.  Vital stable.  Given her reported change in taste, difficulty swallowing and headache, discussed case with neurology.  Dr.  Collins came and evaluated patient at bedside.  Recommended checking MRI brain and ruling out dissection.  CT angio ordered.  MRI brain negative for stroke however Dr. Theda Sers states due to high concern for possibility of stroke would still recommend admission for stroke work-up.  CT angio with small 3 mm aneurysm at origin of anterior temporal branch from the left M1 MCA.  Will ask neurology to comment.  Discussed with hospitalist, she will also reach out to neurosurgery to discuss. Dr. Rogers Blocker accepting.  Final Clinical Impression(s) / ED Diagnoses Final diagnoses:  Stroke-like symptoms    Rx / DC Orders ED Discharge Orders     None        Lucrezia Starch, MD 01/02/21 1636

## 2021-01-02 NOTE — ED Notes (Signed)
Patient transported to MRI 

## 2021-01-02 NOTE — Consult Note (Signed)
Neurology Consult H&P  Erika [name] MR# [id number] 01/02/2021  CC: right face droop and difficulty swallowing.  History is obtained from: patient and husband  HPI: Erika [name] is a 65 y.o. female developed right sided face droop since Saturday which is associated with loss of taste and difficulty swallowing. She also complains of right neck pain at the skull base radiating down into the base of her neck.  Denies head/neck trauma Denies changes in speech, vision, hearing.  LKW: unclear tNK given: No osw IR Thrombectomy No Modified Rankin Scale: 0-Completely asymptomatic and back to baseline post- stroke NIHSS: 0  ROS: A complete ROS was performed and is negative except as noted in the HPI.  Past Medical History:  Diagnosis Date   Anxiety    Arthritis    History of cardiac murmur as a child    Menopausal syndrome    Mild asthma    no inhalers since stopped smoking   Neuroma of foot    OSA on CPAP    Wears glasses      Family History  Problem Relation Age of Onset   Diabetes Mother    Heart disease Mother    Diabetes Maternal Grandmother    Diabetes Maternal Grandfather    Asthma Sister    Cancer Paternal Grandmother    Cancer Maternal Uncle    Uterine cancer Maternal Uncle    Cancer Maternal Aunt    Cancer Brother     Social History:  reports that she quit smoking about 20 years ago. Her smoking use included cigarettes. She has a 30.00 pack-year smoking history. She has never used smokeless tobacco. She reports that she does not drink alcohol and does not use drugs.   Prior to Admission medications   Medication Sig Start Date End Date Taking? Authorizing Provider  acetaminophen (TYLENOL) 650 MG CR tablet Take 650-1,300 mg by mouth every 8 (eight) hours as needed for pain.   Yes [provider]  atorvastatin (LIPITOR) 40 MG tablet TAKE 1 TABLET BY MOUTH EVERY DAY Patient taking differently: Take 40 mg by mouth at bedtime. 10/28/20  Yes Isaac Bliss, Rayford Halsted, MD  buPROPion (WELLBUTRIN XL) 150 MG 24 hr tablet TAKE 1 TABLET BY MOUTH EVERY DAY Patient taking differently: Take 150 mg by mouth at bedtime. 08/01/20  Yes Isaac Bliss, Rayford Halsted, MD  cetirizine (ZYRTEC) 10 MG tablet Take 10 mg by mouth daily as needed for allergies.   Yes [provider]  Cholecalciferol (VITAMIN D3 SUPER STRENGTH) 50 MCG (2000 UT) CAPS Take 2,000 Units by mouth.   Yes [provider]  citalopram (CELEXA) 40 MG tablet TAKE 2 TABLETS (80 MG TOTAL) BY MOUTH AT BEDTIME. 10/11/20  Yes Isaac Bliss, Rayford Halsted, MD  cyanocobalamin (,VITAMIN B-12,) 1000 MCG/ML injection Inject 1 mL (1,000 mcg total) into the muscle every 30 (thirty) days. 05/06/20  Yes Isaac Bliss, Rayford Halsted, MD  hydrochlorothiazide (HYDRODIURIL) 25 MG tablet TAKE 1 TABLET BY MOUTH EVERY DAY Patient taking differently: Take 25 mg by mouth at bedtime. 08/01/20  Yes Isaac Bliss, Rayford Halsted, MD  ibuprofen (ADVIL,MOTRIN) 200 MG tablet Take 600-800 mg by mouth every 6 (six) hours as needed (for headaches).   Yes [provider]  levothyroxine (SYNTHROID) 75 MCG tablet TAKE 1 TABLET BY MOUTH DAILY BEFORE BREAKFAST. Patient taking differently: Take 75 mcg by mouth daily before breakfast. 08/01/20  Yes Isaac Bliss, Rayford Halsted, MD  SYRINGE-NEEDLE, DISP, 3 ML (BD SAFETYGLIDE SYRINGE/NEEDLE) 25G X  1" 3 ML MISC Use for B12 injections 05/06/20   Isaac Bliss, Rayford Halsted, MD    Exam: Current vital signs: BP 114/71   Pulse 68   Temp 98.1 F (36.7 C) (Oral)   Resp 15   SpO2 99%   Physical Exam  Constitutional: Appears well-developed and well-nourished.  Psych: Affect appropriate to situation Eyes: No scleral injection HENT: No OP obstruction. Head: Normocephalic.  Cardiovascular: Normal rate and regular rhythm.  Respiratory: Effort normal, symmetric excursions bilaterally, no audible wheezing. GI: Soft.  No distension. There is no tenderness.  Skin:  WDI  Neuro: Mental Status: Patient is awake, alert, oriented to person, place, month, year, and situation. Patient is able to give a clear and coherent history. Speech  fluent, intact comprehension and repetition. No signs of aphasia or neglect. Visual Fields are full. Pupils are equal, round, and reactive to light. EOMI without ptosis or diploplia.  Facial sensation is symmetric to temperature Facial right lower face weakness .  Hearing is intact to voice. Uvula deviates to right and palate elevates. Shoulder shrug is symmetric. Tongue is midline without atrophy or fasciculations.  Tone is normal. Bulk is normal. 5/5 strength was present in all four extremities. Sensation is symmetric to light touch and temperature in the arms and legs. Deep Tendon Reflexes: 2+ and symmetric in the biceps and patellae. Toes are downgoing bilaterally. FNF and HKS are intact bilaterally. Station and Gait - unsteady with tendency to fall to left  I have reviewed labs in epic and the pertinent results are: Glu 138  I have reviewed the images obtained: MRI brain showed no acute infarction, hemorrhage, or mass. CTA head and neck showed no large vessel occlusion or hemodynamically significant stenosis or evidence of dissection. There is an inferiorly directed outpouching at the origin of the anterior temporal branch of left M1 ~66mm B-->A with ~43mm neck.  Assessment: Erika [name] is a 65 y.o. female PMHx as above with right face droop and difficulty swallowing. Speech evaluated and thought difficulty swallowing was esophageal. Late addendum:  The patient added that the unsteadiness has been ongoing for years and that she developed itchiness in her right ear last week which progressed to pain around her right ear radiating down the right side of her neck Friday and Saturday she developed the face droop. 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 She is fairly unsteady and we are concerned for falls  and would like her to be evaluated by PT prior to discharge.  Plan: Eye patch and eye drops. PT evaluation and if stable may be discharge. Follow up neurosurgery for evaluation of aneurysm.   Electronically signed by:  Lynnae Sandhoff, MD Page: [phone] 01/02/2021, 5:05 PM

## 2021-01-02 NOTE — Consult Note (Signed)
Providing Compassionate, Quality Care - Together   Reason for Consult: Aneurysm Referring Physician: Dr. Bernell Erika [name] is an 65 y.o. female.  HPI: Erika [name] is a 65 year old female with a history of hypertension and hyperlipidemia. She presented to the [facility] ED with a 3 day history of right-sided facial droop. She noted some change in taste and slight difficulty with swallowing. She has had a headache and right ear pain. She denies any changes in vision, photophobia, weakness in upper or lower extremities, or change in speech. 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 Neurosurgery was consulted for further recommendations.  Past Medical History:  Diagnosis Date   Anxiety    Arthritis    History of cardiac murmur as a child    Menopausal syndrome    Mild asthma    no inhalers since stopped smoking   Neuroma of foot    OSA on CPAP    Wears glasses     Past Surgical History:  Procedure Laterality Date   ELBOW SURGERY Right 2002   repair tendon and nerve injury   EXCISION MORTON'S NEUROMA Left 2010   ROBOTIC ASSISTED TOTAL HYSTERECTOMY WITH BILATERAL SALPINGO OOPHERECTOMY N/A 12/31/2017   Procedure: XI ROBOTIC ASSISTED TOTAL HYSTERECTOMY WITH BILATERAL SALPINGO OOPHORECTOMY;  Surgeon: Everitt Amber, MD;  Location: [facility];  Service: Gynecology;  Laterality: N/A;   SHOULDER ARTHROSCOPY W/ ROTATOR CUFF REPAIR Left 09/2016   and burectomy    Family History  Problem Relation Age of Onset   Diabetes Mother    Heart disease Mother    Diabetes Maternal Grandmother    Diabetes Maternal Grandfather    Asthma Sister    Cancer Paternal Grandmother    Cancer Maternal Uncle    Uterine cancer Maternal Uncle    Cancer Maternal Aunt    Cancer Brother     Social History:  reports that she quit smoking about 20 years ago. Her smoking use included cigarettes. She has a 30.00 pack-year  smoking history. She has never used smokeless tobacco. She reports that she does not drink alcohol and does not use drugs.  Allergies:  Allergies  Allergen Reactions   Tape Other (See Comments)    PATIENT PREFERS CLOTH TAPE- THE OTHERS DON'T AGREE WITH HER SKIN   Tetracycline Rash   Tetracycline Hcl Rash    Medications: I have reviewed the patient's current medications.  Results for orders placed or performed during the hospital encounter of 01/02/21 (from the past 48 hour(s))  CBC     Status: None   Collection Time: 01/02/21 10:22 AM  Result Value Ref Range   WBC 7.6 4.0 - 10.5 K/uL   RBC 4.81 3.87 - 5.11 MIL/uL   Hemoglobin 12.9 12.0 - 15.0 g/dL   HCT 41.0 36.0 - 46.0 %   MCV 85.2 80.0 - 100.0 fL   MCH 26.8 26.0 - 34.0 pg   MCHC 31.5 30.0 - 36.0 g/dL   RDW 14.7 11.5 - 15.5 %   Platelets 326 150 - 400 K/uL   nRBC 0.0 0.0 - 0.2 %    Comment: Performed at [facility], [place] [address] [place], [place] [address]  Comprehensive metabolic panel     Status: Abnormal   Collection Time: 01/02/21 10:22 AM  Result Value Ref Range   Sodium 139 135 - 145 mmol/L   Potassium 3.6  3.5 - 5.1 mmol/L   Chloride 100 98 - 111 mmol/L   CO2 27 22 - 32 mmol/L   Glucose, Bld 138 (H) 70 - 99 mg/dL    Comment: Glucose reference range applies only to samples taken after fasting for at least 8 hours.   BUN 14 8 - 23 mg/dL   Creatinine, Ser 0.81 0.44 - 1.00 mg/dL   Calcium 9.6 8.9 - 10.3 mg/dL   Total Protein 7.0 6.5 - 8.1 g/dL   Albumin 3.8 3.5 - 5.0 g/dL   AST 22 15 - 41 U/L   ALT 21 0 - 44 U/L   Alkaline Phosphatase 78 38 - 126 U/L   Total Bilirubin 0.5 0.3 - 1.2 mg/dL   GFR, Estimated >60 >60 mL/min    Comment: (NOTE) Calculated using the CKD-EPI Creatinine Equation (2021)    Anion gap 12 5 - 15    Comment: Performed at [facility] [address], [place], [place] [address]    CT Angio Head W or Wo Contrast  Result Date: 01/02/2021 CLINICAL DATA:  Right facial droop,  vision changes EXAM: CT ANGIOGRAPHY HEAD AND NECK TECHNIQUE: Multidetector CT imaging of the head and neck was performed using the standard protocol during bolus administration of intravenous contrast. Multiplanar CT image reconstructions and MIPs were obtained to evaluate the vascular anatomy. Carotid stenosis measurements (when applicable) are obtained utilizing NASCET criteria, using the distal internal carotid diameter as the denominator. CONTRAST:  32m OMNIPAQUE IOHEXOL 350 MG/ML SOLN COMPARISON:  None. FINDINGS: CT HEAD Brain: There is no acute intracranial hemorrhage, mass effect, or edema. Gray-white differentiation is preserved. There is no extra-axial fluid collection. Ventricles and sulci are within normal limits in size and configuration. Vascular: Better evaluated on CTA portion. Skull: Calvarium is unremarkable. Sinuses/Orbits: No acute finding. Other: None. Review of the MIP images confirms the above findings CTA NECK Aortic arch: Calcified plaque along the arch and patent great vessel origins. No significant proximal subclavian stenosis. Right carotid system: Patent. Mixed plaque along the proximal internal carotid with less than 50% stenosis. Left carotid system: Patent. Mixed plaque along the proximal internal carotid with less than 50% stenosis. Vertebral arteries: Patent.  Codominant.  No stenosis. Skeleton: Mild cervical spine degenerative changes. Other neck: Unremarkable. Upper chest: Unremarkable. Review of the MIP images confirms the above findings CTA HEAD Anterior circulation: Intracranial internal carotid arteries are patent with mild calcified plaque. Anterior and middle cerebral arteries are patent. There is an inferiorly directed outpouching from the anterior temporal branch of the left M1 MCA measuring approximately 3 mm base to apex with a 2 mm neck. Posterior circulation: Intracranial vertebral arteries are patent. Basilar artery is patent. Major cerebellar artery origins are  patent. Bilateral posterior communicating arteries are present. Posterior cerebral arteries are patent. Fetal origin right PCA. Venous sinuses: Patent as allowed by contrast bolus timing. Review of the MIP images confirms the above findings IMPRESSION: No acute intracranial abnormality. No large vessel occlusion or hemodynamically significant stenosis. No evidence of dissection. 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 Electronically Signed   By: PMacy MisM.D.   On: 01/02/2021 15:40   CT Angio Neck W and/or Wo Contrast  Result Date: 01/02/2021 CLINICAL DATA:  Right facial droop, vision changes EXAM: CT ANGIOGRAPHY HEAD AND NECK TECHNIQUE: Multidetector CT imaging of the head and neck was performed using the standard protocol during bolus administration of intravenous contrast. Multiplanar CT  image reconstructions and MIPs were obtained to evaluate the vascular anatomy. Carotid stenosis measurements (when applicable) are obtained utilizing NASCET criteria, using the distal internal carotid diameter as the denominator. CONTRAST:  11m OMNIPAQUE IOHEXOL 350 MG/ML SOLN COMPARISON:  None. FINDINGS: CT HEAD Brain: There is no acute intracranial hemorrhage, mass effect, or edema. Gray-white differentiation is preserved. There is no extra-axial fluid collection. Ventricles and sulci are within normal limits in size and configuration. Vascular: Better evaluated on CTA portion. Skull: Calvarium is unremarkable. Sinuses/Orbits: No acute finding. Other: None. Review of the MIP images confirms the above findings CTA NECK Aortic arch: Calcified plaque along the arch and patent great vessel origins. No significant proximal subclavian stenosis. Right carotid system: Patent. Mixed plaque along the proximal internal carotid with less than 50% stenosis. Left carotid system: Patent. Mixed plaque along the proximal internal carotid with less than 50% stenosis. Vertebral arteries:  Patent.  Codominant.  No stenosis. Skeleton: Mild cervical spine degenerative changes. Other neck: Unremarkable. Upper chest: Unremarkable. Review of the MIP images confirms the above findings CTA HEAD Anterior circulation: Intracranial internal carotid arteries are patent with mild calcified plaque. Anterior and middle cerebral arteries are patent. There is an inferiorly directed outpouching from the anterior temporal branch of the left M1 MCA measuring approximately 3 mm base to apex with a 2 mm neck. Posterior circulation: Intracranial vertebral arteries are patent. Basilar artery is patent. Major cerebellar artery origins are patent. Bilateral posterior communicating arteries are present. Posterior cerebral arteries are patent. Fetal origin right PCA. Venous sinuses: Patent as allowed by contrast bolus timing. Review of the MIP images confirms the above findings IMPRESSION: No acute intracranial abnormality. No large vessel occlusion or hemodynamically significant stenosis. No evidence of dissection. 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 Electronically Signed   By: PMacy MisM.D.   On: 01/02/2021 15:40   MR BRAIN WO CONTRAST  Result Date: 01/02/2021 CLINICAL DATA:  Neuro deficit, acute, stroke suspected facial droop EXAM: MRI HEAD WITHOUT CONTRAST TECHNIQUE: Multiplanar, multiecho pulse sequences of the brain and surrounding structures were obtained without intravenous contrast. COMPARISON:  None. FINDINGS: Brain: There is no acute infarction or intracranial hemorrhage. There is no intracranial mass, mass effect, or edema. There is no hydrocephalus or extra-axial fluid collection. Ventricles and sulci are within normal limits in size and configuration. Minimal foci of T2 hyperintensity in the supratentorial white matter are nonspecific but may reflect minor chronic microvascular ischemic changes. Vascular: Major vessel flow voids at the skull base are  preserved. Skull and upper cervical spine: Normal marrow signal is preserved. Sinuses/Orbits: Paranasal sinuses are aerated. Orbits are unremarkable. Other: Sella is unremarkable.  Mastoid air cells are clear. IMPRESSION: No acute infarction, hemorrhage, or mass. Electronically Signed   By: PMacy MisM.D.   On: 01/02/2021 15:22    Review of Systems  Constitutional: Negative.   HENT: Negative.    Eyes: Negative.   Respiratory: Negative.    Cardiovascular: Negative.   Gastrointestinal: Negative.   Genitourinary: Negative.   Musculoskeletal: Negative.   Skin: Negative.   Neurological:  Positive for headaches. Negative for dizziness, tingling, tremors, sensory change, speech change, focal weakness, seizures, loss of consciousness and weakness.  Endo/Heme/Allergies: Negative.   Psychiatric/Behavioral: Negative.    Blood pressure 114/71, pulse 68, temperature 98.1 F (36.7 C), temperature source Oral, resp. rate 15, SpO2 99 %. Estimated body mass index is 38.19 kg/m as calculated from the following:  Height as of 09/19/20: '5\' 6"'$  (1.676 m).   Weight as of 11/08/20: 107.3 kg.  Physical Exam Constitutional:      Appearance: Normal appearance.  HENT:     Head: Normocephalic and atraumatic.     Nose: Nose normal.     Mouth/Throat:     Mouth: Mucous membranes are moist.     Pharynx: Oropharynx is clear.  Eyes:     Extraocular Movements: Extraocular movements intact.     Conjunctiva/sclera: Conjunctivae normal.     Pupils: Pupils are equal, round, and reactive to light.  Cardiovascular:     Rate and Rhythm: Normal rate and regular rhythm.     Pulses: Normal pulses.  Pulmonary:     Effort: Pulmonary effort is normal.     Breath sounds: Normal breath sounds.  Abdominal:     General: Abdomen is flat.     Palpations: Abdomen is soft.  Musculoskeletal:        General: Normal range of motion.     Cervical back: Normal range of motion and neck supple. No rigidity.  Skin:    General:  Skin is warm and dry.  Neurological:     Mental Status: She is alert and oriented to person, place, and time.     GCS: GCS eye subscore is 4. GCS verbal subscore is 5. GCS motor subscore is 6.     Cranial Nerves: Dysarthria and facial asymmetry present.     Motor: Motor function is intact.     Coordination: Coordination is intact.    Assessment/Plan: Patient presented to the [facility] ED on 01/02/2021 with right-sided facial weakness. 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 Patient is being admitted for further evaluation. The aneurysm is an incidental finding and Erika [name] can follow up for this with Dr. Kathyrn Sheriff as an outpatient. Dr. Cleotilde Neer information has been added in the AVS.  Viona Gilmore, DNP, AGNP-C Nurse Practitioner  [facility] [place]. [address], [place], [place] [address] P: [phone]    F: [phone]  01/02/2021, 6:37 PM

## 2021-01-02 NOTE — Evaluation (Signed)
Clinical/Bedside Swallow Evaluation Patient Details  Name: Erika [name] MRN: [id number] Date of Birth: October 09, 1955  Today's Date: 01/02/2021 Time: SLP Start Time (ACUTE ONLY): 1400 SLP Stop Time (ACUTE ONLY): 1425 SLP Time Calculation (min) (ACUTE ONLY): 25 min  Past Medical History:  Past Medical History:  Diagnosis Date   Anxiety    Arthritis    History of cardiac murmur as a child    Menopausal syndrome    Mild asthma    no inhalers since stopped smoking   Neuroma of foot    OSA on CPAP    Wears glasses    Past Surgical History:  Past Surgical History:  Procedure Laterality Date   ELBOW SURGERY Right 2002   repair tendon and nerve injury   EXCISION MORTON'S NEUROMA Left 2010   ROBOTIC ASSISTED TOTAL HYSTERECTOMY WITH BILATERAL SALPINGO OOPHERECTOMY N/A 12/31/2017   Procedure: XI ROBOTIC ASSISTED TOTAL HYSTERECTOMY WITH BILATERAL SALPINGO OOPHORECTOMY;  Surgeon: Everitt Amber, MD;  Location: [facility];  Service: Gynecology;  Laterality: N/A;   SHOULDER ARTHROSCOPY W/ ROTATOR CUFF REPAIR Left 09/2016   and burectomy   HPI:  65yo female admitted 01/02/21 with right facial weakness. PMH: anxiety, arthritis, asthma, OSA    Assessment / Plan / Recommendation  Clinical Impression  Pt seen at bedside for assessment of swallow function and safety. Pt was awake and alert, able to follow all commands and provide information about her history. She presents with mild right sided facial weakness, but does not report numbness or tingling. Pt has adequate natural dentition, and exhibits a strong volitional cough. Pt tolerated trials of ice chips, thin liquid (via cup and straw), puree, and solid textures. Timely oral prep and clearing noted. No overt s/s aspiration observed on any texture, however, pt reported globus sensation and said she needed to burp a few times to get the trial to go down. This raises concern for esophageal issues. Recommend regular diet with thin liquids, meds whole. Also  recommend consideration of a regular barium swallow to evaluate esophageal stage of the swallow. SLP will follow up with pt once MRI results are available and proceed accordingly. RN and MD informed of results and recommendations.  SLP Visit Diagnosis: Dysphagia, unspecified (R13.10)    Aspiration Risk  Mild aspiration risk    Diet Recommendation     Liquid Administration via: Cup;Straw Medication Administration: Whole meds with liquid Supervision: Patient able to self feed Compensations: Minimize environmental distractions;Slow rate;Small sips/bites Postural Changes: Seated upright at 90 degrees;Remain upright for at least 30 minutes after po intake    Other  Recommendations Oral Care Recommendations: Oral care BID    Recommendations for follow up therapy are one component of a multi-disciplinary discharge planning process, led by the attending physician.  Recommendations may be updated based on patient status, additional functional criteria and insurance authorization.  Follow up Recommendations Other (comment) (TBD)      Frequency and Duration min 1 x/week  2 weeks;1 week       Prognosis Prognosis for Safe Diet Advancement: Good      Swallow Study   General Date of Onset: 01/02/21 HPI: 65yo female admitted 01/02/21 with right facial weakness. PMH: anxiety, arthritis, asthma, OSA Type of Study: Bedside Swallow Evaluation Previous Swallow Assessment: none Diet Prior to this Study: NPO Temperature Spikes Noted: No Respiratory Status: Room air History of Recent Intubation: No Behavior/Cognition: Alert;Cooperative;Pleasant mood Oral Cavity Assessment: Within Functional Limits Oral Care Completed by SLP: No Oral Cavity - Dentition: Adequate natural  dentition Vision: Functional for self-feeding Self-Feeding Abilities: Able to feed self Patient Positioning: Upright in bed Baseline Vocal Quality: Hoarse Volitional Cough: Strong Volitional Swallow: Able to elicit     Oral/Motor/Sensory Function Overall Oral Motor/Sensory Function: Mild impairment Facial ROM: Reduced right Facial Symmetry: Abnormal symmetry right Facial Strength: Reduced right Facial Sensation: Within Functional Limits Lingual ROM: Within Functional Limits Lingual Symmetry: Within Functional Limits Lingual Strength: Within Functional Limits Lingual Sensation: Within Functional Limits Velum: Within Functional Limits Mandible: Within Functional Limits   Ice Chips Ice chips: Within functional limits Presentation: Spoon   Thin Liquid Thin Liquid: Within functional limits Presentation: Cup;Self Fed;Straw    Nectar Thick Nectar Thick Liquid: Not tested   Honey Thick Honey Thick Liquid: Not tested   Puree Puree: Within functional limits Presentation: Self Fed;Spoon   Solid     Solid: Within functional limits Presentation: [place] B. Quentin Ore, [facility], [place] Speech Language Pathologist Office: [phone]  Shonna Chock 01/02/2021,2:50 PM

## 2021-01-02 NOTE — ED Triage Notes (Signed)
Pt here from home with c/o right sided facial drop since Saturday , pt arm or leg weakness or slurred speech

## 2021-01-03 ENCOUNTER — Observation Stay (HOSPITAL_COMMUNITY): Payer: Medicare Other

## 2021-01-03 DIAGNOSIS — I10 Essential (primary) hypertension: Secondary | ICD-10-CM

## 2021-01-03 DIAGNOSIS — E039 Hypothyroidism, unspecified: Secondary | ICD-10-CM | POA: Diagnosis not present

## 2021-01-03 DIAGNOSIS — R29810 Facial weakness: Secondary | ICD-10-CM | POA: Diagnosis not present

## 2021-01-03 DIAGNOSIS — R2990 Unspecified symptoms and signs involving the nervous system: Secondary | ICD-10-CM

## 2021-01-03 DIAGNOSIS — G510 Bell's palsy: Secondary | ICD-10-CM | POA: Diagnosis not present

## 2021-01-03 LAB — HIV ANTIBODY (ROUTINE TESTING W REFLEX): HIV Screen 4th Generation wRfx: NONREACTIVE

## 2021-01-03 LAB — SARS CORONAVIRUS 2 (TAT 6-24 HRS): SARS Coronavirus 2: NEGATIVE

## 2021-01-03 MED ORDER — VITAMIN D 25 MCG (1000 UNIT) PO TABS
2000.0000 [IU] | ORAL_TABLET | Freq: Every day | ORAL | Status: DC
  Administered 2021-01-03 – 2021-01-04 (×2): 2000 [IU] via ORAL
  Filled 2021-01-03 (×2): qty 2

## 2021-01-03 MED ORDER — LEVOTHYROXINE SODIUM 75 MCG PO TABS
75.0000 ug | ORAL_TABLET | Freq: Every day | ORAL | Status: DC
  Administered 2021-01-03: 75 ug via ORAL
  Filled 2021-01-03 (×2): qty 1

## 2021-01-03 MED ORDER — CITALOPRAM HYDROBROMIDE 40 MG PO TABS
80.0000 mg | ORAL_TABLET | Freq: Every day | ORAL | Status: DC
  Administered 2021-01-03: 80 mg via ORAL
  Filled 2021-01-03: qty 2

## 2021-01-03 MED ORDER — HYDROCHLOROTHIAZIDE 25 MG PO TABS
25.0000 mg | ORAL_TABLET | Freq: Every day | ORAL | Status: DC
  Administered 2021-01-03: 25 mg via ORAL
  Filled 2021-01-03: qty 1

## 2021-01-03 MED ORDER — ATORVASTATIN CALCIUM 40 MG PO TABS
40.0000 mg | ORAL_TABLET | Freq: Every day | ORAL | Status: DC
  Administered 2021-01-03: 40 mg via ORAL
  Filled 2021-01-03: qty 1

## 2021-01-03 MED ORDER — BUPROPION HCL ER (XL) 150 MG PO TB24
150.0000 mg | ORAL_TABLET | Freq: Every day | ORAL | Status: DC
  Administered 2021-01-03: 150 mg via ORAL
  Filled 2021-01-03: qty 1

## 2021-01-03 NOTE — Evaluation (Signed)
Physical Therapy Evaluation Patient Details Name: Erika [name] MRN: [id number] DOB: Jul 14, 1955 Today's Date: 01/03/2021  History of Present Illness  Pt is a 65 y/o female who presents with HA, R side facial droop, and mild R sided weakness. 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 NS consulted and pt will follow up outpatient. PMH significant for pt reported COPD (not in chart), L shoulder arthroscopy with rotator cuff repair in 2018.   Clinical Impression  Pt admitted with above diagnosis. Pt currently with functional limitations due to the deficits listed below (see PT Problem List). At the time of PT eval pt was able to perform transfers and ambulation with modified independence and no AD. Pt demonstrating mild weakness in RLE (hip flexors, quads, ankle DF) which she attributes as new since onset of symptoms. Pt reports that overall she feels she is at baseline of function. She has had several falls at home and reports that she feels she has been declining for several months at home from a balance standpoint. Feel she would benefit from outpatient PT at d/c for higher level balance training to decrease risk for future falls. Of note, pt scored 19/24 on the DGI. Acutely, pt will benefit from skilled PT to increase their independence and safety with mobility to allow discharge to the venue listed below. Will follow from [place] while admitted to monitor for functional decline.      Recommendations for follow up therapy are one component of a multi-disciplinary discharge planning process, led by the attending physician.  Recommendations may be updated based on patient status, additional functional criteria and insurance authorization.  Follow Up Recommendations Outpatient PT (Higher level balance training)    Equipment Recommendations  None recommended by PT    Recommendations for Other Services       Precautions / Restrictions Precautions Precautions:  Fall Precaution Comments: Pt reports falls at home but none in the past month Restrictions Weight Bearing Restrictions: No      Mobility  Bed Mobility Overal bed mobility: Modified Independent Bed Mobility: Supine to Sit           General bed mobility comments: HOB elevated. No use of rails.    Transfers Overall transfer level: Needs assistance Equipment used: None Transfers: Sit to/from Stand Sit to Stand: Modified independent (Device/Increase time)         General transfer comment: Pt appears guarded however no unsteadiness or LOB noted. No assist required.  Ambulation/Gait Ambulation/Gait assistance: Modified independent (Device/Increase time) Gait Distance (Feet): 500 Feet Assistive device: None Gait Pattern/deviations: Step-through pattern;Decreased stride length;Trunk flexed;Decreased dorsiflexion - right Gait velocity: Decreased Gait velocity interpretation: 1.31 - 2.62 ft/sec, indicative of limited community ambulator General Gait Details: Increased lateral sway noted but no overt LOB throughout gait training. Pt completed DGI during gait training as well. See below. Pt reports feeling she is ambulating at baseline of function.  Stairs Stairs: Yes Stairs assistance: Supervision Stair Management: Two rails;Alternating pattern;Forwards Number of Stairs: 5 General stair comments: Practice stairs in rehab gym. No assist required. Increased time - pt appears to be concentrating hard on each step.  Wheelchair Mobility    Modified Rankin (Stroke Patients Only)       Balance Overall balance assessment: Needs assistance Sitting-balance support: Feet supported;No upper extremity supported Sitting balance-Leahy Scale: Good     Standing balance support: No upper extremity supported;During functional activity Standing balance-Leahy Scale: Fair  Standardized Balance Assessment Standardized Balance Assessment : Dynamic Gait Index    Dynamic Gait Index Level Surface: Mild Impairment Change in Gait Speed: Mild Impairment Gait with Horizontal Head Turns: Normal Gait with Vertical Head Turns: Normal Gait and Pivot Turn: Normal Step Over Obstacle: Mild Impairment Step Around Obstacles: Mild Impairment Steps: Mild Impairment Total Score: 19       Pertinent Vitals/Pain Pain Assessment: 0-10 Pain Score: 5  Pain Location: headache; right side of head down through R ride of neck Pain Descriptors / Indicators: Headache Pain Intervention(s): Monitored during session    Home Living Family/patient expects to be discharged to:: Private residence Living Arrangements: Spouse/significant other Available Help at Discharge: Family;Available PRN/intermittently Type of Home: House Home Access: Stairs to enter Entrance Stairs-Rails: None Entrance Stairs-Number of Steps: 1 to the back porch, then another step into the house Home Layout: One level Home Equipment: Environmental consultant - 2 wheels;Bedside commode;Shower seat      Prior Function Level of Independence: Independent         Comments: Drives, Estate agent Dominance   Dominant Hand: Right    Extremity/Trunk Assessment   Upper Extremity Assessment Upper Extremity Assessment: Overall WFL for tasks assessed    Lower Extremity Assessment Lower Extremity Assessment: RLE deficits/detail RLE Deficits / Details: Decreased strength in R hip flexor, quad, and ankle DF. Grossly 4/5. Hamstrings 5/5. 5/5 strength grossly on LLE. RLE Sensation: WNL RLE Coordination: decreased gross motor    Cervical / Trunk Assessment Cervical / Trunk Assessment: Normal  Communication   Communication: Other (comment) (Slurred speech the last few days. Pt reports her words dont sound right when they come out.)  Cognition Arousal/Alertness: Awake/alert Behavior During Therapy: [facility] for tasks assessed/performed Overall Cognitive Status: Within Functional Limits for tasks  assessed                                 General Comments: A/O x4      General Comments      Exercises     Assessment/Plan    PT Assessment Patient needs continued PT services  PT Problem List Decreased strength;Decreased activity tolerance;Decreased balance;Decreased mobility;Decreased cognition;Decreased knowledge of use of DME;Decreased safety awareness;Decreased knowledge of precautions;Pain       PT Treatment Interventions DME instruction;Gait training;Stair training;Functional mobility training;Therapeutic activities;Therapeutic exercise;Neuromuscular re-education;Patient/family education    PT Goals (Current goals can be found in the Care Plan section)  Acute Rehab PT Goals Patient Stated Goal: Have facial droop and headache go away. PT Goal Formulation: With patient/family Time For Goal Achievement: 01/10/21 Potential to Achieve Goals: Good    Frequency Min 2X/week   Barriers to discharge        Co-evaluation               AM-PAC PT "6 Clicks" Mobility  Outcome Measure Help needed turning from your back to your side while in a flat bed without using bedrails?: None Help needed moving from lying on your back to sitting on the side of a flat bed without using bedrails?: None Help needed moving to and from a bed to a chair (including a wheelchair)?: None Help needed standing up from a chair using your arms (e.g., wheelchair or bedside chair)?: None Help needed to walk in hospital room?: None Help needed climbing 3-5 steps with a railing? : A Little 6 Click Score: 23    End of Session Equipment  Utilized During Treatment: Gait belt Activity Tolerance: Patient tolerated treatment well Patient left: in chair;with call bell/phone within reach;with family/visitor present Nurse Communication: Mobility status PT Visit Diagnosis: Unsteadiness on feet (R26.81);Pain;Other symptoms and signs involving the nervous system (R29.898) Pain - part of body:   (Headache)    Time: 0981-1914 PT Time Calculation (min) (ACUTE ONLY): 36 min   Charges:   PT Evaluation $PT Eval Low Complexity: 1 Low PT Treatments $Physical Performance Test: 8-22 mins        Rolinda Roan, PT, DPT Acute Rehabilitation Services Pager: [phone] Office: [phone]   Thelma Comp 01/03/2021, 11:43 AM

## 2021-01-03 NOTE — TOC Initial Note (Signed)
Transition of Care [facility] - Initial/Assessment Note    Patient Details  Name: Erika [name] MRN: [id number] Date of Birth: Feb 13, 1956  [facility] of [facility]) CM/SW Contact:    Pollie Friar, RN Phone Number: 01/03/2021, 3:24 PM  Clinical Narrative:                 Pt lives with her spouse at home. Spouse works part time, but could take time off if needed. She has walker and shower seat at home but doesn't currently use either. She does have CPAP at home. Pt denies issues with home medications or transportation. Pt has transport home when medically ready.  Expected Discharge Plan: OP Rehab Barriers to Discharge: Continued Medical Work up   Patient Goals and CMS Choice     Choice offered to / list presented to : Patient  Expected Discharge Plan and Services Expected Discharge Plan: OP Rehab   Discharge Planning Services: CM Consult   Living arrangements for the past 2 months: Single Family Home                                      Prior Living Arrangements/Services Living arrangements for the past 2 months: Single Family Home Lives with:: Spouse Patient language and need for interpreter reviewed:: Yes Do you feel safe going back to the place where you live?: Yes            Criminal Activity/Legal Involvement Pertinent to Current Situation/Hospitalization: No - Comment as needed  Activities of Daily Living      Permission Sought/Granted                  Emotional Assessment Appearance:: Appears stated age Attitude/Demeanor/Rapport: Engaged Affect (typically observed): Accepting Orientation: : Oriented to Self, Oriented to Place, Oriented to  Time, Oriented to Situation   Psych Involvement: No (comment)  Admission diagnosis:  Facial droop [R29.810] Stroke-like symptoms [R29.90] Patient Active Problem List   Diagnosis Date Noted   Facial droop 01/02/2021   Dysphagia 01/02/2021   Hypertension 02/18/2019   Hyperlipidemia 11/18/2018    Congestive heart failure of unknown etiology ([place]) 10/09/2018   Vitamin D deficiency 08/19/2018   Hypothyroid 08/01/2018   B12 deficiency 08/01/2018   Bilateral ovarian cysts 12/31/2017   Routine general medical examination at a health care facility 06/14/2014   Cough 05/04/2013   Fatigue 01/14/2012   Skin tag 08/23/2011   Obstructive sleep apnea 11/30/2008   Anxiety state 01/15/2008   DEPRESSIVE DSORDER, RCR, 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

## 2021-01-03 NOTE — Progress Notes (Signed)
[facility]  PROGRESS NOTE  Erika [name] WCH:[id number] DOB: 1955/04/19 DOA: 01/02/2021 PCP: Isaac Bliss, Rayford Halsted, MD   Brief HPI:   65 year old female with medical history of hypertension, hyperlipidemia, depression, anxiety came to ED with complaints of right facial droop, dysphagia.  She was seen by neurology, ruled out stroke.  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  Swallow evaluation obtained and recommended barium swallow to rule out esophageal cause of dysphagia.    Subjective   Patient seen and examined, still has mild facial droop.   Assessment/Plan:     Bell's palsy -Seen by neurology, stroke ruled out -MRI brain showed no acute findings -Patient to go home with outpatient PT/OT   3 mm aneurysm, anterior temporal branch CTA head and neck showed 3 mm aneurysm at the origin of anterior temporal branch from the left M1 MCA -Neurosurgery was consulted -Patient will follow up with Dr. Kathyrn Sheriff as outpatient  Dysphagia -Speech therapy recommended barium swallow to check esophageal stage of swallowing -Will obtain esophagram /barium swallow in a.m.  Ataxic gait -Outpatient PT  Hyperlipidemia -Continue Lipitor  Hypertension -Continue HCTZ  Hypothyroidism -Continue Synthroid  Obstructive sleep apnea -Continue home CPAP     Data Reviewed:   CBG:  No results for input(s): GLUCAP in the last 168 hours.  SpO2: 96 %    Vitals:   01/03/21 0807 01/03/21 1138 01/03/21 1504 01/03/21 1616  BP: 119/62 123/65 117/67 117/69  Pulse: 64 65 65 76  Resp: 16 18  18   Temp: 97.7 F (36.5 C) 97.8 F (36.6 C)  97.8 F (36.6 C)  TempSrc: Oral Oral  Oral  SpO2: 95% 95% 95% 96%    No intake or output data in the 24 hours ending 01/03/21 1816  No intake/output data recorded.  There were no vitals filed for this visit.  Data Reviewed: Basic Metabolic Panel: Recent Labs  Lab 01/02/21 1022  NA 139  K 3.6  CL 100  CO2 27  GLUCOSE 138*  BUN 14   CREATININE 0.81  CALCIUM 9.6   Liver Function Tests: Recent Labs  Lab 01/02/21 1022  AST 22  ALT 21  ALKPHOS 78  BILITOT 0.5  PROT 7.0  ALBUMIN 3.8   No results for input(s): LIPASE, AMYLASE in the last 168 hours. No results for input(s): AMMONIA in the last 168 hours. CBC: Recent Labs  Lab 01/02/21 1022  WBC 7.6  HGB 12.9  HCT 41.0  MCV 85.2  PLT 326   Cardiac Enzymes: No results for input(s): CKTOTAL, CKMB, CKMBINDEX, TROPONINI in the last 168 hours. BNP (last 3 results) No results for input(s): BNP in the last 8760 hours.  ProBNP (last 3 results) No results for input(s): PROBNP in the last 8760 hours.  CBG: No results for input(s): GLUCAP in the last 168 hours.  Recent Results (from the past 240 hour(s))  SARS CORONAVIRUS 2 (TAT 6-24 HRS) Nasopharyngeal Nasopharyngeal Swab     Status: None   Collection Time: 01/02/21  6:36 PM   Specimen: Nasopharyngeal Swab  Result Value Ref Range Status   SARS Coronavirus 2 NEGATIVE NEGATIVE Final    Comment: (NOTE) SARS-CoV-2 target nucleic acids are NOT DETECTED.  The SARS-CoV-2 RNA is generally detectable in upper and lower respiratory specimens during the acute phase of infection. Negative results do not preclude SARS-CoV-2 infection, do not rule out co-infections with other pathogens, and should not be used as the sole basis for treatment or other patient management decisions. Negative results  must be combined with clinical observations, patient history, and epidemiological information. The expected result is Negative.  Fact Sheet for Patients: SugarRoll.be  Fact Sheet for Healthcare Providers: https://www.woods-mathews.com/  This test is not yet approved or cleared by the [place] FDA and  has been authorized for detection and/or diagnosis of SARS-CoV-2 by FDA under an Emergency Use Authorization (EUA). This EUA will remain  in effect (meaning this test can be  used) for the duration of the COVID-19 declaration under Se ction 564(b)(1) of the Act, 21 U.S.C. section 360bbb-3(b)(1), unless the authorization is terminated or revoked sooner.  Performed at [facility], [place] [address], [place], [place] [address]      Radiology Reports  CT Angio Head W or Wo Contrast  Result Date: 01/02/2021 CLINICAL DATA:  Right facial droop, vision changes EXAM: CT ANGIOGRAPHY HEAD AND NECK TECHNIQUE: Multidetector CT imaging of the head and neck was performed using the standard protocol during bolus administration of intravenous contrast. Multiplanar CT image reconstructions and MIPs were obtained to evaluate the vascular anatomy. Carotid stenosis measurements (when applicable) are obtained utilizing NASCET criteria, using the distal internal carotid diameter as the denominator. CONTRAST:  41mL OMNIPAQUE IOHEXOL 350 MG/ML SOLN COMPARISON:  None. FINDINGS: CT HEAD Brain: There is no acute intracranial hemorrhage, mass effect, or edema. Gray-white differentiation is preserved. There is no extra-axial fluid collection. Ventricles and sulci are within normal limits in size and configuration. Vascular: Better evaluated on CTA portion. Skull: Calvarium is unremarkable. Sinuses/Orbits: No acute finding. Other: None. Review of the MIP images confirms the above findings CTA NECK Aortic arch: Calcified plaque along the arch and patent great vessel origins. No significant proximal subclavian stenosis. Right carotid system: Patent. Mixed plaque along the proximal internal carotid with less than 50% stenosis. Left carotid system: Patent. Mixed plaque along the proximal internal carotid with less than 50% stenosis. Vertebral arteries: Patent.  Codominant.  No stenosis. Skeleton: Mild cervical spine degenerative changes. Other neck: Unremarkable. Upper chest: Unremarkable. Review of the MIP images confirms the above findings CTA HEAD Anterior circulation: Intracranial internal carotid  arteries are patent with mild calcified plaque. Anterior and middle cerebral arteries are patent. There is an inferiorly directed outpouching from the anterior temporal branch of the left M1 MCA measuring approximately 3 mm base to apex with a 2 mm neck. Posterior circulation: Intracranial vertebral arteries are patent. Basilar artery is patent. Major cerebellar artery origins are patent. Bilateral posterior communicating arteries are present. Posterior cerebral arteries are patent. Fetal origin right PCA. Venous sinuses: Patent as allowed by contrast bolus timing. Review of the MIP images confirms the above findings IMPRESSION: No acute intracranial abnormality. No large vessel occlusion or hemodynamically significant stenosis. No evidence of dissection. 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 Electronically Signed   By: Macy Mis M.D.   On: 01/02/2021 15:40   CT Angio Neck W and/or Wo Contrast  Result Date: 01/02/2021 CLINICAL DATA:  Right facial droop, vision changes EXAM: CT ANGIOGRAPHY HEAD AND NECK TECHNIQUE: Multidetector CT imaging of the head and neck was performed using the standard protocol during bolus administration of intravenous contrast. Multiplanar CT image reconstructions and MIPs were obtained to evaluate the vascular anatomy. Carotid stenosis measurements (when applicable) are obtained utilizing NASCET criteria, using the distal internal carotid diameter as the denominator. CONTRAST:  20mL OMNIPAQUE IOHEXOL 350 MG/ML SOLN COMPARISON:  None. FINDINGS: CT HEAD Brain: There is no acute intracranial  hemorrhage, mass effect, or edema. Gray-white differentiation is preserved. There is no extra-axial fluid collection. Ventricles and sulci are within normal limits in size and configuration. Vascular: Better evaluated on CTA portion. Skull: Calvarium is unremarkable. Sinuses/Orbits: No acute finding. Other: None. Review of the MIP images confirms the  above findings CTA NECK Aortic arch: Calcified plaque along the arch and patent great vessel origins. No significant proximal subclavian stenosis. Right carotid system: Patent. Mixed plaque along the proximal internal carotid with less than 50% stenosis. Left carotid system: Patent. Mixed plaque along the proximal internal carotid with less than 50% stenosis. Vertebral arteries: Patent.  Codominant.  No stenosis. Skeleton: Mild cervical spine degenerative changes. Other neck: Unremarkable. Upper chest: Unremarkable. Review of the MIP images confirms the above findings CTA HEAD Anterior circulation: Intracranial internal carotid arteries are patent with mild calcified plaque. Anterior and middle cerebral arteries are patent. There is an inferiorly directed outpouching from the anterior temporal branch of the left M1 MCA measuring approximately 3 mm base to apex with a 2 mm neck. Posterior circulation: Intracranial vertebral arteries are patent. Basilar artery is patent. Major cerebellar artery origins are patent. Bilateral posterior communicating arteries are present. Posterior cerebral arteries are patent. Fetal origin right PCA. Venous sinuses: Patent as allowed by contrast bolus timing. Review of the MIP images confirms the above findings IMPRESSION: No acute intracranial abnormality. No large vessel occlusion or hemodynamically significant stenosis. No evidence of dissection. 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 Electronically Signed   By: Macy Mis M.D.   On: 01/02/2021 15:40   MR BRAIN WO CONTRAST  Result Date: 01/02/2021 CLINICAL DATA:  Neuro deficit, acute, stroke suspected facial droop EXAM: MRI HEAD WITHOUT CONTRAST TECHNIQUE: Multiplanar, multiecho pulse sequences of the brain and surrounding structures were obtained without intravenous contrast. COMPARISON:  None. FINDINGS: Brain: There is no acute infarction or intracranial hemorrhage. There is no  intracranial mass, mass effect, or edema. There is no hydrocephalus or extra-axial fluid collection. Ventricles and sulci are within normal limits in size and configuration. Minimal foci of T2 hyperintensity in the supratentorial white matter are nonspecific but may reflect minor chronic microvascular ischemic changes. Vascular: Major vessel flow voids at the skull base are preserved. Skull and upper cervical spine: Normal marrow signal is preserved. Sinuses/Orbits: Paranasal sinuses are aerated. Orbits are unremarkable. Other: Sella is unremarkable.  Mastoid air cells are clear. IMPRESSION: No acute infarction, hemorrhage, or mass. Electronically Signed   By: Macy Mis M.D.   On: 01/02/2021 15:22     Scheduled medications:     stroke: mapping our early stages of recovery book   Does not apply Once   atorvastatin  40 mg Oral QHS   buPROPion  150 mg Oral QHS   cholecalciferol  2,000 Units Oral Daily   citalopram  80 mg Oral QHS   enoxaparin (LOVENOX) injection  40 mg Subcutaneous Q24H   hydrochlorothiazide  25 mg Oral QHS   levothyroxine  75 mcg Oral QAC breakfast   polyvinyl alcohol  1 drop Both Eyes 5 X Daily    Antibiotics: Anti-infectives (From admission, onward)    None         DVT prophylaxis: Lovenox  Code Status: Full code  Family Communication: No family at bedside   Consultants: Neurology  Procedures:     Objective    Physical Examination:   General-appears in no acute distress Heart-S1-S2, regular, no murmur auscultated Lungs-clear  to auscultation bilaterally, no wheezing or crackles auscultated Abdomen-soft, nontender, no organomegaly Extremities-no edema in the lower extremities Neuro-alert, oriented x3, right facial droop  Status is: Inpatient  Dispo: The patient is from: Home              Anticipated d/c is to: Home              Anticipated d/c date is: 01/04/2021              Patient currently not stable for discharge  Barrier to  discharge-ongoing evaluation for dysphagia  COVID-19 Labs  No results for input(s): DDIMER, FERRITIN, LDH, CRP in the last 72 hours.  Lab Results  Component Value Date   [place] NEGATIVE 01/02/2021              Oswald Hillock   [facility] If 7PM-7AM, please contact night-coverage at [email or web address], Office  [phone]   01/03/2021, 6:16 PM  LOS: 0 days

## 2021-01-03 NOTE — Care Management Obs Status (Signed)
[place] NOTIFICATION   Patient Details  Name: Erika [name] MRN: [id number] Date of Birth: [birth year]   Medicare Observation Status Notification Given:  Yes    Pollie Friar, RN 01/03/2021, 3:21 PM

## 2021-01-03 NOTE — Progress Notes (Addendum)
Speech Language Pathology Treatment: Dysphagia  Patient Details Name: Erika [name] MRN: [id number] DOB: 12-20-1955 Today's Date: 01/03/2021 Time: 1210-1225 SLP Time Calculation (min) (ACUTE ONLY): 15 min  Assessment / Plan / Recommendation Clinical Impression  Pt seen at bedside for follow up after BSE completed yesterday. Pt was awake and alert, pleasant and cooperative. Pt reports no difficulty with swallowing, but indicates she has been taking smaller bites and sips. Pt continues to present with mild right side facial weakness. Speech continues to be fully intelligible.   Pt is now reporting increased numbness in her face, and lingual tingling, which she did not report yesterday on BSE. She indicates her headache is less severe (RN gave pain meds earlier), but continues to be present. RN was informed of this information.  ST will sign off at this time. If needs arise, please do not hesitate to reconsult. If pt's hoarseness does not clear up, recommend referral to ENT.   HPI HPI: 65yo female admitted 01/02/21 with right facial weakness. PMH: anxiety, arthritis, asthma, OSA      SLP Plan  Discharge SLP treatment due to goals met      Recommendations for follow up therapy are one component of a multi-disciplinary discharge planning process, led by the attending physician.  Recommendations may be updated based on patient status, additional functional criteria and insurance authorization.    Recommendations  Diet recommendations: Regular;Thin liquid Liquids provided via: Cup;Straw Medication Administration: Whole meds with liquid Supervision: Patient able to self feed Compensations: Minimize environmental distractions;Slow rate;Small sips/bites Postural Changes and/or Swallow Maneuvers: Seated upright 90 degrees;Upright 30-60 min after meal                Oral Care Recommendations: Oral care BID Follow up Recommendations: None SLP Visit Diagnosis: Dysphagia, unspecified  (R13.10) Plan: Discharge SLP treatment due to (comment)       GO               Kairav Russomanno B. Quentin Ore, [facility] [place] Speech Language Pathologist Office: [phone]  Shonna Chock  01/03/2021, 1:09 PM

## 2021-01-04 ENCOUNTER — Observation Stay (HOSPITAL_COMMUNITY): Payer: Medicare Other

## 2021-01-04 DIAGNOSIS — R29810 Facial weakness: Secondary | ICD-10-CM | POA: Diagnosis not present

## 2021-01-04 DIAGNOSIS — G510 Bell's palsy: Secondary | ICD-10-CM | POA: Diagnosis not present

## 2021-01-04 LAB — GLUCOSE, CAPILLARY: Glucose-Capillary: 96 mg/dL (ref 70–99)

## 2021-01-04 MED ORDER — PREDNISONE 20 MG PO TABS: 40.0000 mg | ORAL_TABLET | Freq: Every day | ORAL | Status: DC

## 2021-01-04 MED ORDER — ACETAMINOPHEN 10 MG/ML IV SOLN
1000.0000 mg | Freq: Once | INTRAVENOUS | Status: AC
  Administered 2021-01-04: 1000 mg via INTRAVENOUS
  Filled 2021-01-04: qty 100

## 2021-01-04 MED ORDER — PANTOPRAZOLE SODIUM 40 MG PO TBEC: 40.0000 mg | DELAYED_RELEASE_TABLET | Freq: Every day | ORAL | 0 refills | Status: DC

## 2021-01-04 MED ORDER — POLYVINYL ALCOHOL 1.4 % OP SOLN: 1.0000 [drp] | Freq: Every day | OPHTHALMIC | 0 refills | Status: DC

## 2021-01-04 MED ORDER — PREDNISONE 20 MG PO TABS: ORAL_TABLET | ORAL | 0 refills | Status: DC

## 2021-01-04 MED ORDER — PANTOPRAZOLE SODIUM 40 MG PO TBEC
40.0000 mg | DELAYED_RELEASE_TABLET | Freq: Every day | ORAL | Status: DC
  Administered 2021-01-04: 40 mg via ORAL
  Filled 2021-01-04: qty 1

## 2021-01-04 NOTE — TOC Transition Note (Signed)
Transition of Care [facility]) - CM/SW Discharge Note   Patient Details  Name: Erika [name] MRN: [id number] Date of Birth: 06-May-1955  [facility] CM/SW Contact:  Pollie Friar, RN Phone Number: 01/04/2021, 1:07 PM   Clinical Narrative:    Patient discharging home with outpatient therapy. Information on the AVS.  Pt has transportation home.   Final next level of care: OP Rehab Barriers to Discharge: No Barriers Identified   Patient Goals and CMS Choice     Choice offered to / list presented to : Patient  Discharge Placement                       Discharge Plan and Services   Discharge Planning Services: CM Consult                                 Social Determinants of Health (SDOH) Interventions     Readmission Risk Interventions No flowsheet data found.

## 2021-01-04 NOTE — Discharge Summary (Signed)
Physician Discharge Summary  Erika [name] ACZ:[id number] DOB: 1956/01/20 DOA: 01/02/2021  PCP: Isaac Bliss, Rayford Halsted, MD  Admit date: 01/02/2021 Discharge date: 01/04/2021  Admitted From: Home  Disposition:  Home   Recommendations for Outpatient Follow-up:  Follow up with PCP in 1-2 weeks Please obtain BMP/CBC in one week Needs to follow up with GI for esophageal dysmotility, and might need endoscopy.     Discharge Condition: Stable.  CODE STATUS: Full Code Diet recommendation: Regular / small frequent meals.   Brief/Interim Summary: 65 year old female with medical history of hypertension, hyperlipidemia, depression, anxiety came to ED with complaints of right facial droop, dysphagia.  She was seen by neurology, ruled out stroke.  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  Swallow evaluation obtained and recommended barium swallow to rule out esophageal cause of dysphagia.  1-Bell's Palsy:  MRI negative for stroke.  Discussed with neurologist today, we can do short prednisone taper.  Plan to discharge on prednisone 40 mg for 3 days, then 20 mg for 3 days then 10 mg for one day.   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 Neurosurgery recommend follow up out patient.   3-Dysphagia;  Report dysphagia for long time.  Esophagogram showed hiatal hernia and mild non specific esophageal dysmotility.  Needs follow up with GI>   4-Ataxic Gait;  Awaiting PT eval prior to discharge.   HLD:  Continue with Lipitor.  HTN; continue with HCTZ  Hypothyroidism; continue with synthroid.   OSA; CPAP/  Cough;  Chest x ray negative    Discharge Diagnoses:  Principal Problem:   Facial droop Active Problems:   DEPRESSIVE DSORDER, RCR, FULL REMISSION   Anxiety state   Obstructive sleep apnea   Hypothyroid   Hyperlipidemia   Hypertension    Dysphagia    Discharge Instructions  Discharge Instructions     Ambulatory referral to Physical Therapy   Complete by: As directed    Diet - low sodium heart healthy   Complete by: As directed    Increase activity slowly   Complete by: As directed       Allergies as of 01/04/2021       Reactions   Tape Other (See Comments)   PATIENT PREFERS CLOTH TAPE- THE OTHERS DON'T AGREE WITH HER SKIN   Tetracycline Rash   Tetracycline Hcl Rash        Medication List     STOP taking these medications    ibuprofen 200 MG tablet Commonly known as: ADVIL       TAKE these medications    acetaminophen 650 MG CR tablet Commonly known as: TYLENOL Take 650-1,300 mg by mouth every 8 (eight) hours as needed for pain.   atorvastatin 40 MG tablet Commonly known as: LIPITOR TAKE 1 TABLET BY MOUTH EVERY DAY What changed: when to take this   BD SafetyGlide Syringe/Needle 25G X 1" 3 ML Misc Generic drug: SYRINGE-NEEDLE (DISP) 3 ML Use for B12 injections   buPROPion 150 MG 24 hr tablet Commonly known as: WELLBUTRIN XL TAKE 1 TABLET BY MOUTH EVERY DAY What changed: when to take this   cetirizine 10 MG tablet Commonly known as: ZYRTEC Take 10 mg by mouth daily as needed for allergies.   citalopram 40 MG tablet Commonly known as: CELEXA TAKE 2 TABLETS (80 MG TOTAL) BY MOUTH AT BEDTIME.   cyanocobalamin 1000  MCG/ML injection Commonly known as: (VITAMIN B-12) Inject 1 mL (1,000 mcg total) into the muscle every 30 (thirty) days.   hydrochlorothiazide 25 MG tablet Commonly known as: HYDRODIURIL TAKE 1 TABLET BY MOUTH EVERY DAY What changed: when to take this   levothyroxine 75 MCG tablet Commonly known as: SYNTHROID TAKE 1 TABLET BY MOUTH DAILY BEFORE BREAKFAST.   pantoprazole 40 MG tablet Commonly known as: PROTONIX Take 1 tablet (40 mg total) by mouth daily.   polyvinyl alcohol 1.4 % ophthalmic solution Commonly known as: LIQUIFILM TEARS Place 1 drop into both eyes 5  (five) times daily.   predniSONE 20 MG tablet Commonly known as: DELTASONE Take 2 tablets for 3 days then 1 tablet for 3 days then half tablet for one day.   Vitamin D3 Super Strength 50 MCG (2000 UT) Caps Generic drug: Cholecalciferol Take 2,000 Units by mouth.        Follow-up Information     Consuella Lose, MD. Schedule an appointment as soon as possible for a visit.   Specialty: Neurosurgery Contact information: [address] [name] [address] [phone]         [facility] Follow up.   Specialty: Rehabilitation Why: The outpatient rehab will contact you for an appointment. If you have not received a call in 24 hours after discharge please call the rehab to schedule an appointment Contact information: [address] [id number] [place] [phone]               Allergies  Allergen Reactions   Tape Other (See Comments)    PATIENT PREFERS CLOTH TAPE- THE OTHERS DON'T AGREE WITH HER SKIN   Tetracycline Rash   Tetracycline Hcl Rash    Consultations: Neurology    Procedures/Studies: CT Angio Head W or Wo Contrast  Result Date: 01/02/2021 CLINICAL DATA:  Right facial droop, vision changes EXAM: CT ANGIOGRAPHY HEAD AND NECK TECHNIQUE: Multidetector CT imaging of the head and neck was performed using the standard protocol during bolus administration of intravenous contrast. Multiplanar CT image reconstructions and MIPs were obtained to evaluate the vascular anatomy. Carotid stenosis measurements (when applicable) are obtained utilizing NASCET criteria, using the distal internal carotid diameter as the denominator. CONTRAST:  37mL OMNIPAQUE IOHEXOL 350 MG/ML SOLN COMPARISON:  None. FINDINGS: CT HEAD Brain: There is no acute intracranial hemorrhage, mass effect, or edema. Gray-white differentiation is preserved. There is no extra-axial fluid collection. Ventricles and  sulci are within normal limits in size and configuration. Vascular: Better evaluated on CTA portion. Skull: Calvarium is unremarkable. Sinuses/Orbits: No acute finding. Other: None. Review of the MIP images confirms the above findings CTA NECK Aortic arch: Calcified plaque along the arch and patent great vessel origins. No significant proximal subclavian stenosis. Right carotid system: Patent. Mixed plaque along the proximal internal carotid with less than 50% stenosis. Left carotid system: Patent. Mixed plaque along the proximal internal carotid with less than 50% stenosis. Vertebral arteries: Patent.  Codominant.  No stenosis. Skeleton: Mild cervical spine degenerative changes. Other neck: Unremarkable. Upper chest: Unremarkable. Review of the MIP images confirms the above findings CTA HEAD Anterior circulation: Intracranial internal carotid arteries are patent with mild calcified plaque. Anterior and middle cerebral arteries are patent. There is an inferiorly directed outpouching from the anterior temporal branch of the left M1 MCA measuring approximately 3 mm base to apex with a 2 mm neck. Posterior circulation: Intracranial vertebral arteries are patent. Basilar artery is patent. Major  cerebellar artery origins are patent. Bilateral posterior communicating arteries are present. Posterior cerebral arteries are patent. Fetal origin right PCA. Venous sinuses: Patent as allowed by contrast bolus timing. Review of the MIP images confirms the above findings IMPRESSION: No acute intracranial abnormality. No large vessel occlusion or hemodynamically significant stenosis. No evidence of dissection. 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 Electronically Signed   By: Macy Mis M.D.   On: 01/02/2021 15:40   CT Angio Neck W and/or Wo Contrast  Result Date: 01/02/2021 CLINICAL DATA:  Right facial droop, vision changes EXAM: CT ANGIOGRAPHY HEAD AND NECK TECHNIQUE:  Multidetector CT imaging of the head and neck was performed using the standard protocol during bolus administration of intravenous contrast. Multiplanar CT image reconstructions and MIPs were obtained to evaluate the vascular anatomy. Carotid stenosis measurements (when applicable) are obtained utilizing NASCET criteria, using the distal internal carotid diameter as the denominator. CONTRAST:  47mL OMNIPAQUE IOHEXOL 350 MG/ML SOLN COMPARISON:  None. FINDINGS: CT HEAD Brain: There is no acute intracranial hemorrhage, mass effect, or edema. Gray-white differentiation is preserved. There is no extra-axial fluid collection. Ventricles and sulci are within normal limits in size and configuration. Vascular: Better evaluated on CTA portion. Skull: Calvarium is unremarkable. Sinuses/Orbits: No acute finding. Other: None. Review of the MIP images confirms the above findings CTA NECK Aortic arch: Calcified plaque along the arch and patent great vessel origins. No significant proximal subclavian stenosis. Right carotid system: Patent. Mixed plaque along the proximal internal carotid with less than 50% stenosis. Left carotid system: Patent. Mixed plaque along the proximal internal carotid with less than 50% stenosis. Vertebral arteries: Patent.  Codominant.  No stenosis. Skeleton: Mild cervical spine degenerative changes. Other neck: Unremarkable. Upper chest: Unremarkable. Review of the MIP images confirms the above findings CTA HEAD Anterior circulation: Intracranial internal carotid arteries are patent with mild calcified plaque. Anterior and middle cerebral arteries are patent. There is an inferiorly directed outpouching from the anterior temporal branch of the left M1 MCA measuring approximately 3 mm base to apex with a 2 mm neck. Posterior circulation: Intracranial vertebral arteries are patent. Basilar artery is patent. Major cerebellar artery origins are patent. Bilateral posterior communicating arteries are present.  Posterior cerebral arteries are patent. Fetal origin right PCA. Venous sinuses: Patent as allowed by contrast bolus timing. Review of the MIP images confirms the above findings IMPRESSION: No acute intracranial abnormality. No large vessel occlusion or hemodynamically significant stenosis. No evidence of dissection. 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 Electronically Signed   By: Macy Mis M.D.   On: 01/02/2021 15:40   MR BRAIN WO CONTRAST  Result Date: 01/02/2021 CLINICAL DATA:  Neuro deficit, acute, stroke suspected facial droop EXAM: MRI HEAD WITHOUT CONTRAST TECHNIQUE: Multiplanar, multiecho pulse sequences of the brain and surrounding structures were obtained without intravenous contrast. COMPARISON:  None. FINDINGS: Brain: There is no acute infarction or intracranial hemorrhage. There is no intracranial mass, mass effect, or edema. There is no hydrocephalus or extra-axial fluid collection. Ventricles and sulci are within normal limits in size and configuration. Minimal foci of T2 hyperintensity in the supratentorial white matter are nonspecific but may reflect minor chronic microvascular ischemic changes. Vascular: Major vessel flow voids at the skull base are preserved. Skull and upper cervical spine: Normal marrow signal is preserved. Sinuses/Orbits: Paranasal sinuses are aerated. Orbits are unremarkable. Other: Sella is unremarkable.  Mastoid air cells are clear. IMPRESSION: No acute infarction, hemorrhage, or mass. Electronically Signed   By: Macy Mis M.D.   On: 01/02/2021 15:22   DG CHEST PORT 1 VIEW  Result Date: 01/04/2021 CLINICAL DATA:  Cough EXAM: PORTABLE CHEST 1 VIEW COMPARISON:  02/17/2020 FINDINGS: Midline trachea. Mild cardiomegaly. Atherosclerosis in the transverse aorta. No pleural effusion or pneumothorax. Vague increased density projecting over the left lung base is favored to be due to overlying breast tissue. Clear lungs.  IMPRESSION: No acute cardiopulmonary disease. Aortic Atherosclerosis (ICD10-I70.0). Electronically Signed   By: Abigail Miyamoto M.D.   On: 01/04/2021 09:26   DG ESOPHAGUS W DOUBLE CM (HD)  Result Date: 01/04/2021 CLINICAL DATA:  Food sticking in the throat, globus sensation, dysphagia. EXAM: ESOPHOGRAM / BARIUM SWALLOW / BARIUM TABLET STUDY TECHNIQUE: Combined double contrast and single contrast examination performed using effervescent crystals, thick barium liquid, and thin barium liquid. The patient was observed with fluoroscopy swallowing a 13 mm barium sulphate tablet. FLUOROSCOPY TIME:  Fluoroscopy Time:  2 minutes, 48 seconds Radiation Exposure Index (if provided by the fluoroscopic device): 45.1 mGy Number of Acquired Spot Images: 1 COMPARISON:  Chest CT 02/17/2020 FINDINGS: The pharyngeal phase of swallowing appears normal. Primary peristaltic waves are disrupted on 2/4 swallows with secondary and tertiary contractions observed. There is a small type 1 hiatal hernia. Just above this there is a somewhat serpentine course of the distal most esophagus for example on image 76 series 15, with an exaggerated anterior fold in the distal esophagus making it difficult to exclude smooth narrowing. Esophageal caliber in this vicinity is about 1.8 cm, and a 13 mm barium tablet passed briskly into the stomach. Incidental note made of atherosclerotic calcification of the aortic arch. IMPRESSION: 1. Mild nonspecific esophageal dysmotility disorder, with occasional disruption of primary peristaltic waves in the esophagus and with secondary and distal tertiary contractions. 2. Small type 1 hiatal hernia. 3. Equivocal smooth narrowing at the gastroesophageal junction with slightly serpentine course of the distal most esophagus possibly caused by the hiatal hernia. There no substantial degree of mucosal irregularity in this region and the 13 mm barium tablet passed without difficulty into the stomach. 4.  Aortic  Atherosclerosis (ICD10-I70.0). Electronically Signed   By: Van Clines M.D.   On: 01/04/2021 10:45     Subjective: She was awaiting esophagogram. Report mild cough. Report mild headaches.    Discharge Exam: Vitals:   01/04/21 0836 01/04/21 1154  BP: (!) 107/57 118/70  Pulse: 70 69  Resp: 16 18  Temp: 98 F (36.7 C) 98.2 F (36.8 C)  SpO2: 94% 94%     General: Pt is alert, awake, not in acute distress Cardiovascular: RRR, S1/S2 +, no rubs, no gallops Respiratory: CTA bilaterally, no wheezing, no rhonchi Abdominal: Soft, NT, ND, bowel sounds + Extremities: no edema, no cyanosis    The results of significant diagnostics from this hospitalization (including imaging, microbiology, ancillary and laboratory) are listed below for reference.     Microbiology: Recent Results (from the past 240 hour(s))  SARS CORONAVIRUS 2 (TAT 6-24 HRS) Nasopharyngeal Nasopharyngeal Swab     Status: None   Collection Time: 01/02/21  6:36 PM   Specimen: Nasopharyngeal Swab  Result Value Ref Range Status   SARS Coronavirus 2 NEGATIVE NEGATIVE Final    Comment: (NOTE) SARS-CoV-2 target nucleic acids are NOT DETECTED.  The SARS-CoV-2 RNA is generally detectable in upper and lower respiratory specimens during the acute phase of infection. Negative results do  not preclude SARS-CoV-2 infection, do not rule out co-infections with other pathogens, and should not be used as the sole basis for treatment or other patient management decisions. Negative results must be combined with clinical observations, patient history, and epidemiological information. The expected result is Negative.  Fact Sheet for Patients: SugarRoll.be  Fact Sheet for Healthcare Providers: https://www.woods-mathews.com/  This test is not yet approved or cleared by the [place] FDA and  has been authorized for detection and/or diagnosis of SARS-CoV-2 by FDA under an Emergency  Use Authorization (EUA). This EUA will remain  in effect (meaning this test can be used) for the duration of the COVID-19 declaration under Se ction 564(b)(1) of the Act, 21 U.S.C. section 360bbb-3(b)(1), unless the authorization is terminated or revoked sooner.  Performed at [facility], [place] [address], [place], [place] [address]      Labs: BNP (last 3 results) No results for input(s): BNP in the last 8760 hours. Basic Metabolic Panel: Recent Labs  Lab 01/02/21 1022  NA 139  K 3.6  CL 100  CO2 27  GLUCOSE 138*  BUN 14  CREATININE 0.81  CALCIUM 9.6   Liver Function Tests: Recent Labs  Lab 01/02/21 1022  AST 22  ALT 21  ALKPHOS 78  BILITOT 0.5  PROT 7.0  ALBUMIN 3.8   No results for input(s): LIPASE, AMYLASE in the last 168 hours. No results for input(s): AMMONIA in the last 168 hours. CBC: Recent Labs  Lab 01/02/21 1022  WBC 7.6  HGB 12.9  HCT 41.0  MCV 85.2  PLT 326   Cardiac Enzymes: No results for input(s): CKTOTAL, CKMB, CKMBINDEX, TROPONINI in the last 168 hours. BNP: Invalid input(s): POCBNP CBG: Recent Labs  Lab 01/04/21 0910  GLUCAP 96   D-Dimer No results for input(s): DDIMER in the last 72 hours. Hgb A1c No results for input(s): HGBA1C in the last 72 hours. Lipid Profile No results for input(s): CHOL, HDL, LDLCALC, TRIG, CHOLHDL, LDLDIRECT in the last 72 hours. Thyroid function studies No results for input(s): TSH, T4TOTAL, T3FREE, THYROIDAB in the last 72 hours.  Invalid input(s): FREET3 Anemia work up No results for input(s): VITAMINB12, FOLATE, FERRITIN, TIBC, IRON, RETICCTPCT in the last 72 hours. Urinalysis    Component Value Date/Time   COLORURINE STRAW (A) 12/26/2017 1038   APPEARANCEUR CLEAR 12/26/2017 1038   LABSPEC 1.005 12/26/2017 1038   PHURINE 6.0 12/26/2017 1038   GLUCOSEU NEGATIVE 12/26/2017 1038   HGBUR SMALL (A) 12/26/2017 1038   BILIRUBINUR NEGATIVE 12/26/2017 1038   BILIRUBINUR negative 08/01/2017  1617   KETONESUR NEGATIVE 12/26/2017 1038   PROTEINUR NEGATIVE 12/26/2017 1038   UROBILINOGEN 0.2 08/01/2017 1617   NITRITE NEGATIVE 12/26/2017 1038   LEUKOCYTESUR NEGATIVE 12/26/2017 1038   Sepsis Labs Invalid input(s): PROCALCITONIN,  WBC,  LACTICIDVEN Microbiology Recent Results (from the past 240 hour(s))  SARS CORONAVIRUS 2 (TAT 6-24 HRS) Nasopharyngeal Nasopharyngeal Swab     Status: None   Collection Time: 01/02/21  6:36 PM   Specimen: Nasopharyngeal Swab  Result Value Ref Range Status   SARS Coronavirus 2 NEGATIVE NEGATIVE Final    Comment: (NOTE) SARS-CoV-2 target nucleic acids are NOT DETECTED.  The SARS-CoV-2 RNA is generally detectable in upper and lower respiratory specimens during the acute phase of infection. Negative results do not preclude SARS-CoV-2 infection, do not rule out co-infections with other pathogens, and should not be used as the sole basis for treatment or other patient management decisions. Negative results must be  combined with clinical observations, patient history, and epidemiological information. The expected result is Negative.  Fact Sheet for Patients: SugarRoll.be  Fact Sheet for Healthcare Providers: https://www.woods-mathews.com/  This test is not yet approved or cleared by the [place] FDA and  has been authorized for detection and/or diagnosis of SARS-CoV-2 by FDA under an Emergency Use Authorization (EUA). This EUA will remain  in effect (meaning this test can be used) for the duration of the COVID-19 declaration under Se ction 564(b)(1) of the Act, 21 U.S.C. section 360bbb-3(b)(1), unless the authorization is terminated or revoked sooner.  Performed at [facility], [place] [address] [place],  [address]      Time coordinating discharge: 40 minutes  SIGNED:   Elmarie Shiley, MD  [facility]

## 2021-01-04 NOTE — Plan of Care (Signed)
  Problem: Education: Goal: Knowledge of disease or condition will improve Outcome: Adequate for Discharge Goal: Knowledge of secondary prevention will improve Outcome: Adequate for Discharge Goal: Knowledge of patient specific risk factors addressed and post discharge goals established will improve Outcome: Adequate for Discharge Goal: Individualized Educational Video(s) Outcome: Adequate for Discharge   Problem: Coping: Goal: Will verbalize positive feelings about self Outcome: Adequate for Discharge Goal: Will identify appropriate support needs Outcome: Adequate for Discharge   Problem: Health Behavior/Discharge Planning: Goal: Ability to manage health-related needs will improve Outcome: Adequate for Discharge   Problem: Self-Care: Goal: Ability to participate in self-care as condition permits will improve Outcome: Adequate for Discharge Goal: Verbalization of feelings and concerns over difficulty with self-care will improve Outcome: Adequate for Discharge Goal: Ability to communicate needs accurately will improve Outcome: Adequate for Discharge   Problem: Nutrition: Goal: Risk of aspiration will decrease Outcome: Adequate for Discharge Goal: Dietary intake will improve Outcome: Adequate for Discharge   Problem: Intracerebral Hemorrhage Tissue Perfusion: Goal: Complications of Intracerebral Hemorrhage will be minimized Outcome: Adequate for Discharge   Problem: Spontaneous Subarachnoid Hemorrhage Tissue Perfusion: Goal: Complications of Spontaneous Subarachnoid Hemorrhage will be minimized Outcome: Adequate for Discharge   Problem: Education: Goal: Knowledge of General Education information will improve Description: Including pain rating scale, medication(s)/side effects and non-pharmacologic comfort measures Outcome: Adequate for Discharge   Problem: Health Behavior/Discharge Planning: Goal: Ability to manage health-related needs will improve Outcome: Adequate for  Discharge   Problem: Clinical Measurements: Goal: Ability to maintain clinical measurements within normal limits will improve Outcome: Adequate for Discharge Goal: Will remain free from infection Outcome: Adequate for Discharge Goal: Diagnostic test results will improve Outcome: Adequate for Discharge Goal: Respiratory complications will improve Outcome: Adequate for Discharge Goal: Cardiovascular complication will be avoided Outcome: Adequate for Discharge   Problem: Activity: Goal: Risk for activity intolerance will decrease Outcome: Adequate for Discharge   Problem: Nutrition: Goal: Adequate nutrition will be maintained Outcome: Adequate for Discharge   Problem: Coping: Goal: Level of anxiety will decrease Outcome: Adequate for Discharge   Problem: Elimination: Goal: Will not experience complications related to bowel motility Outcome: Adequate for Discharge Goal: Will not experience complications related to urinary retention Outcome: Adequate for Discharge   Problem: Pain Managment: Goal: General experience of comfort will improve Outcome: Adequate for Discharge   Problem: Safety: Goal: Ability to remain free from injury will improve Outcome: Adequate for Discharge   Problem: Skin Integrity: Goal: Risk for impaired skin integrity will decrease Outcome: Adequate for Discharge   Problem: Acute Rehab PT Goals(only PT should resolve) Goal: Pt Will Go Supine/Side To Sit Outcome: Adequate for Discharge Goal: Patient Will Transfer Sit To/From Stand Outcome: Adequate for Discharge Goal: Pt Will Ambulate Outcome: Adequate for Discharge Goal: Pt Will Go Up/Down Stairs Outcome: Adequate for Discharge

## 2021-01-10 ENCOUNTER — Telehealth: Payer: Self-pay

## 2021-01-10 ENCOUNTER — Other Ambulatory Visit: Payer: Self-pay

## 2021-01-10 ENCOUNTER — Ambulatory Visit (AMBULATORY_SURGERY_CENTER): Payer: Self-pay

## 2021-01-10 VITALS — Ht 66.0 in | Wt 235.0 lb

## 2021-01-10 DIAGNOSIS — Z86010 Personal history of colonic polyps: Secondary | ICD-10-CM

## 2021-01-10 MED ORDER — SUTAB 1479-225-188 MG PO TABS: 12.0000 | ORAL_TABLET | ORAL | 0 refills | Status: DC

## 2021-01-10 NOTE — Telephone Encounter (Signed)
Called pt to inform that per Dr. Henrene Pastor she needed to get both Cardiac and Neuro clearance before we will be able to do her procedure.  Pt instructed to have these providers to put in their notes after evaluation and clear statement as to she being cleared, or to have a letter she can present to [facility].  She understands that the procedure was cancelled and that she is to call to get back on the schedule once she has clearances.

## 2021-01-10 NOTE — Progress Notes (Signed)
No allergies to soy or egg Pt is not on blood thinners or diet pills Denies issues with sedation/intubation Denies atrial flutter/fib Denies constipation  Pt is aware of Covid safety and care partner requirements.   Recent hospitalization (9/19-21)-went to hospital with suspicion of stroke but turned out to be Bell's Palsy.  While there found a hernia at top of stomach and brain aneurysm.    She was advised to f/up with a neurologist for this.  Also had MI in 2020, but was told no damage--however PCP advised pt to be seen for cardiologist for fatigue.

## 2021-01-10 NOTE — Telephone Encounter (Signed)
I recommend both cardiology and neurology clearance before scheduling her routine surveillance colonoscopy.  Please notify the patient.  Please make sure that there is documented clearance for me especially before proceeding with routine previsit and scheduling.  Thanks

## 2021-01-10 NOTE — Telephone Encounter (Signed)
Dr. Henrene Pastor:  During PV pt noted the following:   Recent hospitalization (9/19-21)-went to hospital with suspicion of stroke but turned out to be Bell's Palsy.  While there found a hernia at top of stomach and brain aneurysm.    She was advised to f/up with a neurologist for this.  Also had MI in 2020, but was told no damage--however PCP advised pt to be seen for cardiologist for fatigue she continues to have.    Please advise if she is good to go or does she need to be seen by [place] or be done after the neurologist or cardiologist?   Thank you.

## 2021-01-13 ENCOUNTER — Ambulatory Visit: Payer: PRIVATE HEALTH INSURANCE | Admitting: Cardiovascular Disease

## 2021-01-23 ENCOUNTER — Telehealth: Payer: Self-pay

## 2021-01-23 NOTE — Telephone Encounter (Signed)
-----   Message from Irene Shipper, MD sent at 01/23/2021  9:56 AM EDT ----- Regarding: RE: Rake pt Vaughan Basta, This patient was scheduled for surveillance colonoscopy.  We have not seen her since her last colonoscopy 2014.  Anesthesia has reviewed the chart and noticed that patient's PCP requested cardiology evaluation for shortness of breath (questioned anginal equivalent).  He reasonably requests cardiac clearance before scheduling her routine surveillance colonoscopy.  She is scheduled for this Thursday, October 13.  Please contact patient and let her know that we will need to cancel this procedure but will be happy to reschedule after she obtains cardiac clearance.  All for her safety, of course.  Thank you. Dr. Henrene Pastor   ----- Message ----- From: Osvaldo Angst, CRNA Sent: 01/23/2021   7:27 AM EDT To: Irene Shipper, MD Subject: [facility] pt                                         Dr. Henrene Pastor,  This pt is scheduled with you for a colonoscopy.  She was seen in 7/22 c/o chest tightness and dypnea with exertion.  She was referred to cardiology and f/u testing was ordered at that time; neither of which has been completed.  She will need to be cleared by cardiology prior to having her procedure at [facility]  Thanks,  Osvaldo Angst

## 2021-01-23 NOTE — Telephone Encounter (Signed)
Left message for pt to call back.  Spoke with Pt and she is aware, procedure cancelled.Pt knows to contact the office once she has been seen by cardiology and has clearance to reschedule the procedure.

## 2021-01-26 ENCOUNTER — Encounter: Payer: Medicare Other | Admitting: Internal Medicine

## 2021-01-28 ENCOUNTER — Other Ambulatory Visit: Payer: Self-pay | Admitting: Internal Medicine

## 2021-01-28 DIAGNOSIS — E039 Hypothyroidism, unspecified: Secondary | ICD-10-CM

## 2021-01-28 DIAGNOSIS — I10 Essential (primary) hypertension: Secondary | ICD-10-CM

## 2021-01-28 DIAGNOSIS — F411 Generalized anxiety disorder: Secondary | ICD-10-CM

## 2021-02-08 ENCOUNTER — Encounter: Payer: Self-pay | Admitting: Cardiology

## 2021-02-08 ENCOUNTER — Other Ambulatory Visit: Payer: Self-pay

## 2021-02-08 ENCOUNTER — Ambulatory Visit (INDEPENDENT_AMBULATORY_CARE_PROVIDER_SITE_OTHER): Payer: Medicare Other

## 2021-02-08 ENCOUNTER — Ambulatory Visit (INDEPENDENT_AMBULATORY_CARE_PROVIDER_SITE_OTHER): Payer: Medicare Other | Admitting: Cardiology

## 2021-02-08 VITALS — BP 128/80 | HR 69 | Ht 66.0 in | Wt 236.2 lb

## 2021-02-08 DIAGNOSIS — R0602 Shortness of breath: Secondary | ICD-10-CM

## 2021-02-08 DIAGNOSIS — I872 Venous insufficiency (chronic) (peripheral): Secondary | ICD-10-CM | POA: Diagnosis not present

## 2021-02-08 DIAGNOSIS — R002 Palpitations: Secondary | ICD-10-CM

## 2021-02-08 DIAGNOSIS — I10 Essential (primary) hypertension: Secondary | ICD-10-CM | POA: Diagnosis not present

## 2021-02-08 NOTE — Progress Notes (Unsigned)
Patient enrolled for [employer] to mail a 14 day ZIO XT monitor to her home. 

## 2021-02-08 NOTE — Progress Notes (Signed)
Cardiology Office Note:    Date:  02/08/2021   ID:  CHANY WOOLWORTH, DOB 1956/01/20, MRN [id number]  PCP:  Isaac Bliss, Rayford Halsted, MD  Cardiologist:  Berniece Salines, DO  Electrophysiologist:  None   Referring MD: Isaac Bliss, Estel*   " I am having shortness of breath"   History of Present Illness:    Erika [name] is a 65 y.o. female with a hx of hypertension, hyperlipidemia, family history of congestive heart failure which was diagnosed in her mother at a younger age, presents today to be evaluated for shortness of breath as well as palpitation.  Patient reports that over the last several weeks to months she has had worsening shortness of breath on exertion.  She notes that she is unable to work outside of her house like she usually does without getting significantly short of breath.  She tells me during this time she gets palpitations and once the heart rate goes really fast she feels like she is getting lightheaded and has not passed out.  This is when she gets the shortness of breath.  Nothing makes it better or worse.   Past Medical History:  Diagnosis Date   Anxiety    Arthritis    Bell's palsy    COPD (chronic obstructive pulmonary disease) ([place])    History of cardiac murmur as a child    Hyperlipidemia    Hypertension    Menopausal syndrome    Mild asthma    no inhalers since stopped smoking   Myocardial infarction ([place])    2020--pt states no damage   Neuroma of foot    OSA on CPAP    Wears glasses     Past Surgical History:  Procedure Laterality Date   ELBOW SURGERY Right 2002   repair tendon and nerve injury   EXCISION MORTON'S NEUROMA Left 2010   ROBOTIC ASSISTED TOTAL HYSTERECTOMY WITH BILATERAL SALPINGO OOPHERECTOMY N/A 12/31/2017   Procedure: XI ROBOTIC ASSISTED TOTAL HYSTERECTOMY WITH BILATERAL SALPINGO OOPHORECTOMY;  Surgeon: Everitt Amber, MD;  Location: [facility];  Service: Gynecology;  Laterality: N/A;   SHOULDER ARTHROSCOPY W/ ROTATOR CUFF REPAIR Left  09/2016   and burectomy    Current Medications: Current Meds  Medication Sig   acetaminophen (TYLENOL) 650 MG CR tablet Take 650-1,300 mg by mouth every 8 (eight) hours as needed for pain.   atorvastatin (LIPITOR) 40 MG tablet TAKE 1 TABLET BY MOUTH EVERY DAY (Patient taking differently: Take 40 mg by mouth at bedtime.)   buPROPion (WELLBUTRIN XL) 150 MG 24 hr tablet TAKE 1 TABLET BY MOUTH EVERY DAY   cetirizine (ZYRTEC) 10 MG tablet Take 10 mg by mouth daily as needed for allergies.   Cholecalciferol (VITAMIN D3 SUPER STRENGTH) 50 MCG (2000 UT) CAPS Take 2,000 Units by mouth.   citalopram (CELEXA) 40 MG tablet TAKE 2 TABLETS (80 MG TOTAL) BY MOUTH AT BEDTIME.   cyanocobalamin (,VITAMIN B-12,) 1000 MCG/ML injection Inject 1 mL (1,000 mcg total) into the muscle every 30 (thirty) days.   hydrochlorothiazide (HYDRODIURIL) 25 MG tablet TAKE 1 TABLET BY MOUTH EVERY DAY   levothyroxine (SYNTHROID) 75 MCG tablet TAKE 1 TABLET BY MOUTH EVERY DAY BEFORE BREAKFAST   polyvinyl alcohol (LIQUIFILM TEARS) 1.4 % ophthalmic solution Place 1 drop into both eyes 5 (five) times daily.   SYRINGE-NEEDLE, DISP, 3 ML (BD SAFETYGLIDE SYRINGE/NEEDLE) 25G X 1" 3 ML MISC Use for B12 injections     Allergies:   Tape, Tetracycline, and Tetracycline hcl  Social History   Socioeconomic History   Marital status: Married    Spouse name: Not on file   Number of children: Not on file   Years of education: Not on file   Highest education level: Not on file  Occupational History   Occupation: Solicitor: [employer]  Tobacco Use   Smoking status: Former    Packs/day: 1.00    Years: 30.00    Pack years: 30.00    Types: Cigarettes    Quit date: 06/01/2000    Years since quitting: 20.7   Smokeless tobacco: Never  Vaping Use   Vaping Use: Never used  Substance and Sexual Activity   Alcohol use: No   Drug use: Never   Sexual activity: Not Currently    Birth control/protection:  Post-menopausal  Other Topics Concern   Not on file  Social History Narrative   Not on file   Social Determinants of Health   Financial Resource Strain: Not on file  Food Insecurity: Not on file  Transportation Needs: Not on file  Physical Activity: Not on file  Stress: Not on file  Social Connections: Not on file     Family History: The patient's family history includes Asthma in her sister; Cancer in her brother, maternal aunt, maternal uncle, and paternal grandmother; Diabetes in her maternal grandfather, maternal grandmother, and mother; Heart disease in her mother; Uterine cancer in her maternal uncle. There is no history of Colon cancer, Colon polyps, Esophageal cancer, Rectal cancer, or Stomach cancer.  ROS:   Review of Systems  Constitution: Negative for decreased appetite, fever and weight gain.  HENT: Negative for congestion, ear discharge, hoarse voice and sore throat.   Eyes: Negative for discharge, redness, vision loss in right eye and visual halos.  Cardiovascular: Negative for chest pain, dyspnea on exertion, leg swelling, orthopnea and palpitations.  Respiratory: Negative for cough, hemoptysis, shortness of breath and snoring.   Endocrine: Negative for heat intolerance and polyphagia.  Hematologic/Lymphatic: Negative for bleeding problem. Does not bruise/bleed easily.  Skin: Negative for flushing, nail changes, rash and suspicious lesions.  Musculoskeletal: Negative for arthritis, joint pain, muscle cramps, myalgias, neck pain and stiffness.  Gastrointestinal: Negative for abdominal pain, bowel incontinence, diarrhea and excessive appetite.  Genitourinary: Negative for decreased libido, genital sores and incomplete emptying.  Neurological: Negative for brief paralysis, focal weakness, headaches and loss of balance.  Psychiatric/Behavioral: Negative for altered mental status, depression and suicidal ideas.  Allergic/Immunologic: Negative for HIV exposure and  persistent infections.    EKGs/Labs/Other Studies Reviewed:    The following studies were reviewed today:   EKG:  The ekg ordered today demonstrates sinus rhythm, heart rate 69 bpm.  Recent Labs: 05/05/2020: TSH 2.80 01/02/2021: ALT 21; BUN 14; Creatinine, Ser 0.81; Hemoglobin 12.9; Platelets 326; Potassium 3.6; Sodium 139  Recent Lipid Panel    Component Value Date/Time   CHOL 155 09/22/2020 0759   TRIG 110.0 09/22/2020 0759   HDL 61.70 09/22/2020 0759   CHOLHDL 3 09/22/2020 0759   VLDL 22.0 09/22/2020 0759   LDLCALC 71 09/22/2020 0759   LDLDIRECT 151.7 03/18/2013 0824    Physical Exam:    VS:  BP 128/80   Pulse 69   Ht 5\' 6"  (1.676 m)   Wt 236 lb 3.2 oz (107.1 kg)   SpO2 99%   BMI 38.12 kg/m     Wt Readings from Last 3 Encounters:  02/08/21 236 lb 3.2 oz (107.1 kg)  01/10/21  235 lb (106.6 kg)  11/08/20 236 lb 9.6 oz (107.3 kg)     GEN: Well nourished, well developed in no acute distress HEENT: Normal NECK: No JVD; No carotid bruits LYMPHATICS: No lymphadenopathy CARDIAC: S1S2 noted,RRR, no murmurs, rubs, gallops RESPIRATORY:  Clear to auscultation without rales, wheezing or rhonchi  ABDOMEN: Soft, non-tender, non-distended, +bowel sounds, no guarding. EXTREMITIES: No edema, No cyanosis, no clubbing MUSCULOSKELETAL:  No deformity  SKIN: Warm and dry NEUROLOGIC:  Alert and oriented x 3, non-focal PSYCHIATRIC:  Normal affect, good insight  ASSESSMENT:    1. SOB (shortness of breath)   2. Hypertension, unspecified type   3. Palpitations   4. Chronic venous insufficiency    PLAN:     I would like to rule out a cardiovascular etiology of this palpitation, therefore at this time I would like to placed a zio patch for  14  days. In additon with the worsening shortness of breath a transthoracic echocardiogram will be ordered to assess LV/RV function and any structural abnormalities. Once these testing have been performed amd reviewed further reccomendations will  be made. For now, I do reccomend that the patient goes to the nearest ED if  symptoms recur.  There is evidence of venous insufficiency no significant leg edema we will continue to monitor the patient.  Blood pressure is acceptable, continue with current antihypertensive regimen.  The patient is in agreement with the above plan. The patient left the office in stable condition.  The patient will follow up in 4 months.   Medication Adjustments/Labs and Tests Ordered: Current medicines are reviewed at length with the patient today.  Concerns regarding medicines are outlined above.  Orders Placed This Encounter  Procedures   LONG TERM MONITOR (3-14 DAYS)   EKG 12-Lead   ECHOCARDIOGRAM COMPLETE   No orders of the defined types were placed in this encounter.   Patient Instructions  Medication Instructions:  Your physician recommends that you continue on your current medications as directed. Please refer to the Current Medication list given to you today.  *If you need a refill on your cardiac medications before your next appointment, please call your pharmacy*   Lab Work: None If you have labs (blood work) drawn today and your tests are completely normal, you will receive your results only by: [place] (if you have MyChart) OR A paper copy in the mail If you have any lab test that is abnormal or we need to change your treatment, we will call you to review the results.   Testing/Procedures: Your physician has requested that you have an echocardiogram. Echocardiography is a painless test that uses sound waves to create images of your heart. It provides your doctor with information about the size and shape of your heart and how well your heart's chambers and valves are working. This procedure takes approximately one hour. There are no restrictions for this procedure.  ZIO XT- Long Term Monitor Instructions  Your physician has requested you wear a ZIO patch monitor for 14 days.   This is a single patch monitor. [employer] supplies one patch monitor per enrollment. Additional stickers are not available. Please do not apply patch if you will be having a Nuclear Stress Test,  Echocardiogram, Cardiac CT, MRI, or Chest Xray during the period you would be wearing the  monitor. The patch cannot be worn during these tests. You cannot remove and re-apply the  ZIO XT patch monitor.  Your ZIO patch monitor will be mailed 3 day  USPS to your address on file. It may take 3-5 days  to receive your monitor after you have been enrolled.  Once you have received your monitor, please review the enclosed instructions. Your monitor  has already been registered assigning a specific monitor serial # to you.  Billing and Patient Assistance Program Information  We have supplied [employer] with any of your insurance information on file for billing purposes. [employer] offers a sliding scale Patient Assistance Program for patients that do not have  insurance, or whose insurance does not completely cover the cost of the ZIO monitor.  You must apply for the Patient Assistance Program to qualify for this discounted rate.  To apply, please call [employer] at [phone], select option 4, select option 2, ask to apply for  Patient Assistance Program. Theodore Demark will ask your household income, and how many people  are in your household. They will quote your out-of-pocket cost based on that information.  [employer] will also be able to set up a 28-month, interest-free payment plan if needed.  Applying the monitor   Shave hair from upper left chest.  Hold abrader disc by orange tab. Rub abrader in 40 strokes over the upper left chest as  indicated in your monitor instructions.  Clean area with 4 enclosed alcohol pads. Let dry.  Apply patch as indicated in monitor instructions. Patch will be placed under collarbone on left  side of chest with arrow pointing upward.  Rub patch adhesive wings for 2 minutes. Remove  white label marked "1". Remove the white  label marked "2". Rub patch adhesive wings for 2 additional minutes.  While looking in a mirror, press and release button in center of patch. A small green light will  flash 3-4 times. This will be your only indicator that the monitor has been turned on.  Do not shower for the first 24 hours. You may shower after the first 24 hours.  Press the button if you feel a symptom. You will hear a small click. Record Date, Time and  Symptom in the Patient Logbook.  When you are ready to remove the patch, follow instructions on the last 2 pages of Patient  Logbook. Stick patch monitor onto the last page of Patient Logbook.  Place Patient Logbook in the blue and white box. Use locking tab on box and tape box closed  securely. The blue and white box has prepaid postage on it. Please place it in the mailbox as  soon as possible. Your physician should have your test results approximately 7 days after the  monitor has been mailed back to [facility]  Call [place] at [phone] if you have questions regarding  your ZIO XT patch monitor. Call them immediately if you see an orange light blinking on your  monitor.  If your monitor falls off in less than 4 days, contact our Monitor department at [phone].  If your monitor becomes loose or falls off after 4 days call [employer] at [phone] for  suggestions on securing your monitor    Follow-Up: At [facility], you and your health needs are our priority.  As part of our continuing mission to provide you with exceptional heart care, we have created designated Provider Care Teams.  These Care Teams include your primary Cardiologist (physician) and Advanced Practice Providers (APPs -  Physician Assistants and Nurse Practitioners) who all work together to provide you with the care you need, when you need it.  We recommend signing up for the  patient portal called "MyChart".  Sign up  information is provided on this After Visit Summary.  MyChart is used to connect with patients for Virtual Visits (Telemedicine).  Patients are able to view lab/test results, encounter notes, upcoming appointments, etc.  Non-urgent messages can be sent to your provider as well.   To learn more about what you can do with MyChart, go to [email or web address].    Your next appointment:   4 month(s)  The format for your next appointment:   In Person  Provider:   Dr. Ellyn Hack    Other Instructions     Adopting a Healthy Lifestyle.  Know what a healthy weight is for you (roughly BMI <25) and aim to maintain this   Aim for 7+ servings of fruits and vegetables daily   65-80+ fluid ounces of water or unsweet tea for healthy kidneys   Limit to max 1 drink of alcohol per day; avoid smoking/tobacco   Limit animal fats in diet for cholesterol and heart health - choose grass fed whenever available   Avoid highly processed foods, and foods high in saturated/trans fats   Aim for low stress - take time to unwind and care for your mental health   Aim for 150 min of moderate intensity exercise weekly for heart health, and weights twice weekly for bone health   Aim for 7-9 hours of sleep daily   When it comes to diets, agreement about the perfect plan isnt easy to find, even among the experts. Experts at the [employer] developed an idea known as the Healthy Eating Plate. Just imagine a plate divided into logical, healthy portions.   The emphasis is on diet quality:   Load up on vegetables and fruits - one-half of your plate: Aim for color and variety, and remember that potatoes dont count.   Go for whole grains - one-quarter of your plate: Whole wheat, barley, wheat berries, quinoa, oats, brown rice, and foods made with them. If you want pasta, go with whole wheat pasta.   Protein power - one-quarter of your plate: Fish, chicken, beans, and nuts are all healthy,  versatile protein sources. Limit red meat.   The diet, however, does go beyond the plate, offering a few other suggestions.   Use healthy plant oils, such as olive, canola, soy, corn, sunflower and peanut. Check the labels, and avoid partially hydrogenated oil, which have unhealthy trans fats.   If youre thirsty, drink water. Coffee and tea are good in moderation, but skip sugary drinks and limit milk and dairy products to one or two daily servings.   The type of carbohydrate in the diet is more important than the amount. Some sources of carbohydrates, such as vegetables, fruits, whole grains, and beans-are healthier than others.   Finally, stay active  Signed, Berniece Salines, DO  02/08/2021 2:07 PM    [place]

## 2021-02-08 NOTE — Patient Instructions (Addendum)
Medication Instructions:  Your physician recommends that you continue on your current medications as directed. Please refer to the Current Medication list given to you today.  *If you need a refill on your cardiac medications before your next appointment, please call your pharmacy*   Lab Work: None If you have labs (blood work) drawn today and your tests are completely normal, you will receive your results only by: [name] (if you have MyChart) OR A paper copy in the mail If you have any lab test that is abnormal or we need to change your treatment, we will call you to review the results.   Testing/Procedures: Your physician has requested that you have an echocardiogram. Echocardiography is a painless test that uses sound waves to create images of your heart. It provides your doctor with information about the size and shape of your heart and how well your heart's chambers and valves are working. This procedure takes approximately one hour. There are no restrictions for this procedure.  ZIO XT- Long Term Monitor Instructions  Your physician has requested you wear a ZIO patch monitor for 14 days.  This is a single patch monitor. [employer] supplies one patch monitor per enrollment. Additional stickers are not available. Please do not apply patch if you will be having a Nuclear Stress Test,  Echocardiogram, Cardiac CT, MRI, or Chest Xray during the period you would be wearing the  monitor. The patch cannot be worn during these tests. You cannot remove and re-apply the  ZIO XT patch monitor.  Your ZIO patch monitor will be mailed 3 day USPS to your address on file. It may take 3-5 days  to receive your monitor after you have been enrolled.  Once you have received your monitor, please review the enclosed instructions. Your monitor  has already been registered assigning a specific monitor serial # to you.  Billing and Patient Assistance Program Information  We have supplied [employer] with  any of your insurance information on file for billing purposes. [employer] offers a sliding scale Patient Assistance Program for patients that do not have  insurance, or whose insurance does not completely cover the cost of the ZIO monitor.  You must apply for the Patient Assistance Program to qualify for this discounted rate.  To apply, please call [employer] at [phone], select option 4, select option 2, ask to apply for  Patient Assistance Program. Erika [name] will ask your household income, and how many people  are in your household. They will quote your out-of-pocket cost based on that information.  [employer] will also be able to set up a 89-month interest-free payment plan if needed.  Applying the monitor   Shave hair from upper left chest.  Hold abrader disc by orange tab. Rub abrader in 40 strokes over the upper left chest as  indicated in your monitor instructions.  Clean area with 4 enclosed alcohol pads. Let dry.  Apply patch as indicated in monitor instructions. Patch will be placed under collarbone on left  side of chest with arrow pointing upward.  Rub patch adhesive wings for 2 minutes. Remove white label marked "1". Remove the white  label marked "2". Rub patch adhesive wings for 2 additional minutes.  While looking in a mirror, press and release button in center of patch. A small green light will  flash 3-4 times. This will be your only indicator that the monitor has been turned on.  Do not shower for the first 24 hours. You may shower after the first  24 hours.  Press the button if you feel a symptom. You will hear a small click. Record Date, Time and  Symptom in the Patient Logbook.  When you are ready to remove the patch, follow instructions on the last 2 pages of Patient  Logbook. Stick patch monitor onto the last page of Patient Logbook.  Place Patient Logbook in the blue and white box. Use locking tab on box and tape box closed  securely. The blue and white box has prepaid  postage on it. Please place it in the mailbox as  soon as possible. Your physician should have your test results approximately 7 days after the  monitor has been mailed back to [facility].  Call [place] at [phone] if you have questions regarding  your ZIO XT patch monitor. Call them immediately if you see an orange light blinking on your  monitor.  If your monitor falls off in less than 4 days, contact our Monitor department at [phone].  If your monitor becomes loose or falls off after 4 days call [employer] at [phone] for  suggestions on securing your monitor    Follow-Up: At [facility], you and your health needs are our priority.  As part of our continuing mission to provide you with exceptional heart care, we have created designated Provider Care Teams.  These Care Teams include your primary Cardiologist (physician) and Advanced Practice Providers (APPs -  Physician Assistants and Nurse Practitioners) who all work together to provide you with the care you need, when you need it.  We recommend signing up for the patient portal called "MyChart".  Sign up information is provided on this After Visit Summary.  MyChart is used to connect with patients for Virtual Visits (Telemedicine).  Patients are able to view lab/test results, encounter notes, upcoming appointments, etc.  Non-urgent messages can be sent to your provider as well.   To learn more about what you can do with MyChart, go to [email or web address].    Your next appointment:   4 month(s)  The format for your next appointment:   In Person  Provider:   Berniece Salines, DO [address], [place], [place] [address]     Other Instructions

## 2021-02-10 DIAGNOSIS — R002 Palpitations: Secondary | ICD-10-CM | POA: Diagnosis not present

## 2021-02-17 ENCOUNTER — Other Ambulatory Visit: Payer: Self-pay | Admitting: Internal Medicine

## 2021-03-02 ENCOUNTER — Other Ambulatory Visit: Payer: Self-pay

## 2021-03-02 ENCOUNTER — Ambulatory Visit (HOSPITAL_COMMUNITY): Payer: Medicare Other | Attending: Cardiology

## 2021-03-02 DIAGNOSIS — R0602 Shortness of breath: Secondary | ICD-10-CM | POA: Diagnosis present

## 2021-03-02 LAB — ECHOCARDIOGRAM COMPLETE
Area-P 1/2: 3.99 cm2
S' Lateral: 3.3 cm

## 2021-03-15 ENCOUNTER — Telehealth: Payer: Self-pay | Admitting: Cardiology

## 2021-03-15 ENCOUNTER — Other Ambulatory Visit: Payer: Self-pay

## 2021-03-15 MED ORDER — METOPROLOL SUCCINATE ER 25 MG PO TB24: 12.5000 mg | ORAL_TABLET | Freq: Every day | ORAL | 1 refills | Status: DC

## 2021-03-15 NOTE — Telephone Encounter (Signed)
Called patient, gave results. Patient aware to start medication, RX sent to verified pharmacy.  Patient given ECHO results as well.   Thanks!

## 2021-03-15 NOTE — Telephone Encounter (Signed)
  Pt is returning call to get results 

## 2021-03-30 ENCOUNTER — Other Ambulatory Visit: Payer: Self-pay | Admitting: Internal Medicine

## 2021-03-30 DIAGNOSIS — F3342 Major depressive disorder, recurrent, in full remission: Secondary | ICD-10-CM

## 2021-04-11 ENCOUNTER — Ambulatory Visit (INDEPENDENT_AMBULATORY_CARE_PROVIDER_SITE_OTHER): Payer: Medicare Other | Admitting: Family Medicine

## 2021-04-11 ENCOUNTER — Encounter: Payer: Self-pay | Admitting: Family Medicine

## 2021-04-11 VITALS — BP 118/70 | HR 69 | Temp 98.3°F | Wt 235.0 lb

## 2021-04-11 DIAGNOSIS — J40 Bronchitis, not specified as acute or chronic: Secondary | ICD-10-CM | POA: Diagnosis not present

## 2021-04-11 MED ORDER — AMOXICILLIN-POT CLAVULANATE 875-125 MG PO TABS: 1.0000 | ORAL_TABLET | Freq: Two times a day (BID) | ORAL | 0 refills | Status: DC

## 2021-04-11 MED ORDER — ALBUTEROL SULFATE HFA 108 (90 BASE) MCG/ACT IN AERS: 2.0000 | INHALATION_SPRAY | RESPIRATORY_TRACT | 0 refills | Status: DC | PRN

## 2021-04-11 MED ORDER — HYDROCODONE BIT-HOMATROP MBR 5-1.5 MG/5ML PO SOLN: 5.0000 mL | ORAL | 0 refills | Status: DC | PRN

## 2021-04-11 MED ORDER — METHYLPREDNISOLONE 4 MG PO TBPK: ORAL_TABLET | ORAL | 0 refills | Status: DC

## 2021-04-11 NOTE — Progress Notes (Signed)
° °  Subjective:    Patient ID: Erika [name], female    DOB: [birth year], 65 y.o.   MRN: [id number]  HPI Here for 2 weeks of chest congestion and coughing. This has been worse the past few days. She feels tight and has mild SOB. No chest pain or fever. Taking Mucinex DM. She has tested negative twice for the Covid-19 virus.    Review of Systems  Constitutional: Negative.   HENT: Negative.    Eyes: Negative.   Respiratory:  Positive for cough, chest tightness and wheezing. Negative for shortness of breath.   Cardiovascular: Negative.       Objective:   Physical Exam Constitutional:      Appearance: Normal appearance. She is not ill-appearing.  HENT:     Nose: Nose normal.     Mouth/Throat:     Pharynx: Oropharynx is clear.  Eyes:     Conjunctiva/sclera: Conjunctivae normal.  Pulmonary:     Effort: Pulmonary effort is normal. No respiratory distress.     Breath sounds: No rales.     Comments: Mild scattered rhonchi and wheezes  Lymphadenopathy:     Cervical: No cervical adenopathy.  Neurological:     Mental Status: She is alert.          Assessment & Plan:  Bronchitis, treat with Augmentin and a Medrol dose pack. Use albuterol as needed.  Alysia Penna, MD

## 2021-04-26 ENCOUNTER — Encounter: Payer: Self-pay | Admitting: Family Medicine

## 2021-04-26 ENCOUNTER — Telehealth (INDEPENDENT_AMBULATORY_CARE_PROVIDER_SITE_OTHER): Payer: Medicare Other | Admitting: Family Medicine

## 2021-04-26 ENCOUNTER — Telehealth: Payer: Self-pay | Admitting: Internal Medicine

## 2021-04-26 DIAGNOSIS — E039 Hypothyroidism, unspecified: Secondary | ICD-10-CM

## 2021-04-26 DIAGNOSIS — I10 Essential (primary) hypertension: Secondary | ICD-10-CM

## 2021-04-26 DIAGNOSIS — J40 Bronchitis, not specified as acute or chronic: Secondary | ICD-10-CM

## 2021-04-26 DIAGNOSIS — F411 Generalized anxiety disorder: Secondary | ICD-10-CM

## 2021-04-26 DIAGNOSIS — F3342 Major depressive disorder, recurrent, in full remission: Secondary | ICD-10-CM

## 2021-04-26 MED ORDER — BUPROPION HCL ER (XL) 150 MG PO TB24: 150.0000 mg | ORAL_TABLET | Freq: Every day | ORAL | 1 refills | Status: DC

## 2021-04-26 MED ORDER — HYDROCHLOROTHIAZIDE 25 MG PO TABS: 25.0000 mg | ORAL_TABLET | Freq: Every day | ORAL | 1 refills | Status: DC

## 2021-04-26 MED ORDER — LEVOFLOXACIN 500 MG PO TABS: 500.0000 mg | ORAL_TABLET | Freq: Every day | ORAL | 0 refills | Status: AC

## 2021-04-26 MED ORDER — LEVOTHYROXINE SODIUM 75 MCG PO TABS: 75.0000 ug | ORAL_TABLET | Freq: Every day | ORAL | 1 refills | Status: DC

## 2021-04-26 MED ORDER — CITALOPRAM HYDROBROMIDE 40 MG PO TABS: 80.0000 mg | ORAL_TABLET | Freq: Every day | ORAL | 1 refills | Status: DC

## 2021-04-26 MED ORDER — ATORVASTATIN CALCIUM 40 MG PO TABS: 40.0000 mg | ORAL_TABLET | Freq: Every day | ORAL | 1 refills | Status: DC

## 2021-04-26 NOTE — Progress Notes (Signed)
Subjective:    Patient ID: Erika [name], female    DOB: [birth year], 66 y.o.   MRN: [id number]  HPI Virtual Visit via Video Note  I connected with the patient on 04/26/21 at  4:00 PM EST by a video enabled telemedicine application and verified that I am speaking with the correct person using two identifiers.  Location patient: home Location provider:work or home office Persons participating in the virtual visit: patient, provider  I discussed the limitations of evaluation and management by telemedicine and the availability of in person appointments. The patient expressed understanding and agreed to proceed.   HPI: Here for continuing URI symptoms. She has had chest congestion and a dry cough for the past 3 weeks. No fever or SOB. She was seen on 04-11-21 and she was given  10 days of Augmentin and Hycodan. She says this did not help at all. She still has the cough and it gives her a headache. She has tested negative for the Covid virus. Drinking fluids. She asks what she can use OTC for the coughing .   ROS: See pertinent positives and negatives per HPI.  Past Medical History:  Diagnosis Date   Anxiety    Arthritis    Bell's palsy    COPD (chronic obstructive pulmonary disease) ([place])    History of cardiac murmur as a child    Hyperlipidemia    Hypertension    Menopausal syndrome    Mild asthma    no inhalers since stopped smoking   Myocardial infarction ([place])    2020--pt states no damage   Neuroma of foot    OSA on CPAP    Wears glasses     Past Surgical History:  Procedure Laterality Date   ELBOW SURGERY Right 2002   repair tendon and nerve injury   EXCISION MORTON'S NEUROMA Left 2010   ROBOTIC ASSISTED TOTAL HYSTERECTOMY WITH BILATERAL SALPINGO OOPHERECTOMY N/A 12/31/2017   Procedure: XI ROBOTIC ASSISTED TOTAL HYSTERECTOMY WITH BILATERAL SALPINGO OOPHORECTOMY;  Surgeon: Everitt Amber, MD;  Location: [facility];  Service: Gynecology;  Laterality: N/A;   SHOULDER  ARTHROSCOPY W/ ROTATOR CUFF REPAIR Left 09/2016   and burectomy    Family History  Problem Relation Age of Onset   Diabetes Mother    Heart disease Mother    Asthma Sister    Cancer Brother    Cancer Maternal Aunt    Cancer Maternal Uncle    Uterine cancer Maternal Uncle    Diabetes Maternal Grandmother    Diabetes Maternal Grandfather    Cancer Paternal Grandmother    Colon cancer Neg Hx    Colon polyps Neg Hx    Esophageal cancer Neg Hx    Rectal cancer Neg Hx    Stomach cancer Neg Hx      Current Outpatient Medications:    acetaminophen (TYLENOL) 650 MG CR tablet, Take 650-1,300 mg by mouth every 8 (eight) hours as needed for pain., Disp: , Rfl:    albuterol (VENTOLIN HFA) 108 (90 Base) MCG/ACT inhaler, Inhale 2 puffs into the lungs every 4 (four) hours as needed for wheezing or shortness of breath., Disp: 18 g, Rfl: 0   atorvastatin (LIPITOR) 40 MG tablet, Take 1 tablet (40 mg total) by mouth daily., Disp: 90 tablet, Rfl: 1   buPROPion (WELLBUTRIN XL) 150 MG 24 hr tablet, Take 1 tablet (150 mg total) by mouth daily., Disp: 90 tablet, Rfl: 1   cetirizine (ZYRTEC) 10 MG tablet, Take 10 mg by mouth  daily as needed for allergies., Disp: , Rfl:    Cholecalciferol (VITAMIN D3 SUPER STRENGTH) 50 MCG (2000 UT) CAPS, Take 2,000 Units by mouth., Disp: , Rfl:    citalopram (CELEXA) 40 MG tablet, Take 2 tablets (80 mg total) by mouth at bedtime., Disp: 180 tablet, Rfl: 1   cyanocobalamin (,VITAMIN B-12,) 1000 MCG/ML injection, Inject 1 mL (1,000 mcg total) into the muscle every 30 (thirty) days., Disp: 18 mL, Rfl: 2   hydrochlorothiazide (HYDRODIURIL) 25 MG tablet, Take 1 tablet (25 mg total) by mouth daily., Disp: 90 tablet, Rfl: 1   HYDROcodone bit-homatropine (HYCODAN) 5-1.5 MG/5ML syrup, Take 5 mLs by mouth every 4 (four) hours as needed for cough., Disp: 240 mL, Rfl: 0   levofloxacin (LEVAQUIN) 500 MG tablet, Take 1 tablet (500 mg total) by mouth daily for 10 days., Disp: 10 tablet,  Rfl: 0   levothyroxine (SYNTHROID) 75 MCG tablet, Take 1 tablet (75 mcg total) by mouth daily before breakfast., Disp: 90 tablet, Rfl: 1   methylPREDNISolone (MEDROL DOSEPAK) 4 MG TBPK tablet, As directed, Disp: 21 tablet, Rfl: 0   metoprolol succinate (TOPROL XL) 25 MG 24 hr tablet, Take 0.5 tablets (12.5 mg total) by mouth daily., Disp: 60 tablet, Rfl: 1   polyvinyl alcohol (LIQUIFILM TEARS) 1.4 % ophthalmic solution, Place 1 drop into both eyes 5 (five) times daily., Disp: 15 mL, Rfl: 0   SYRINGE-NEEDLE, DISP, 3 ML (BD SAFETYGLIDE SYRINGE/NEEDLE) 25G X 1" 3 ML MISC, Use for B12 injections, Disp: 100 each, Rfl: 11  EXAM:  VITALS per patient if applicable:  GENERAL: alert, oriented, appears well and in no acute distress  HEENT: atraumatic, conjunttiva clear, no obvious abnormalities on inspection of external nose and ears  NECK: normal movements of the head and neck  LUNGS: on inspection no signs of respiratory distress, breathing rate appears normal, no obvious gross SOB, gasping or wheezing  CV: no obvious cyanosis  MS: moves all visible extremities without noticeable abnormality  PSYCH/NEURO: pleasant and cooperative, no obvious depression or anxiety, speech and thought processing grossly intact  ASSESSMENT AND PLAN: Bronchitis, treat with 10 days of Levaquin. Use Delsym as needed for the cough. Recheck as needed.  Alysia Penna, MD  Discussed the following assessment and plan:  No diagnosis found.     I discussed the assessment and treatment plan with the patient. The patient was provided an opportunity to ask questions and all were answered. The patient agreed with the plan and demonstrated an understanding of the instructions.   The patient was advised to call back or seek an in-person evaluation if the symptoms worsen or if the condition fails to improve as anticipated.      Review of Systems     Objective:   Physical Exam        Assessment & Plan:

## 2021-04-26 NOTE — Telephone Encounter (Signed)
Patient is requesting a medication refill for citalopram (CELEXA) 40 MG tablet [[id number]] and buPROPion (WELLBUTRIN XL) 150 MG 24 hr tablet [[id number]] to be sent to her new pharmacy.   Patient stated that all her medication should be transferred over to her new pharmacy because her insurance switched it from the [facility] to [facility].   The pharmacy is the [facility] on  [address] [place], [place] [address].  Patient could be contacted at [phone].  Please advise.

## 2021-04-26 NOTE — Telephone Encounter (Signed)
Rx's sent in. °

## 2021-05-10 ENCOUNTER — Other Ambulatory Visit: Payer: Self-pay | Admitting: Internal Medicine

## 2021-05-10 DIAGNOSIS — I10 Essential (primary) hypertension: Secondary | ICD-10-CM

## 2021-06-09 ENCOUNTER — Other Ambulatory Visit: Payer: Self-pay | Admitting: Internal Medicine

## 2021-06-12 ENCOUNTER — Ambulatory Visit: Payer: Medicare Other | Admitting: Cardiology

## 2021-06-14 ENCOUNTER — Encounter: Payer: Self-pay | Admitting: Cardiology

## 2021-06-14 ENCOUNTER — Other Ambulatory Visit: Payer: Self-pay

## 2021-06-14 ENCOUNTER — Ambulatory Visit (INDEPENDENT_AMBULATORY_CARE_PROVIDER_SITE_OTHER): Payer: Medicare Other | Admitting: Cardiology

## 2021-06-14 VITALS — BP 130/80 | HR 66 | Ht 66.0 in | Wt 241.6 lb

## 2021-06-14 DIAGNOSIS — R0609 Other forms of dyspnea: Secondary | ICD-10-CM | POA: Diagnosis not present

## 2021-06-14 DIAGNOSIS — Z79899 Other long term (current) drug therapy: Secondary | ICD-10-CM | POA: Diagnosis not present

## 2021-06-14 DIAGNOSIS — I471 Supraventricular tachycardia, unspecified: Secondary | ICD-10-CM

## 2021-06-14 DIAGNOSIS — Z8249 Family history of ischemic heart disease and other diseases of the circulatory system: Secondary | ICD-10-CM | POA: Diagnosis not present

## 2021-06-14 DIAGNOSIS — I119 Hypertensive heart disease without heart failure: Secondary | ICD-10-CM

## 2021-06-14 DIAGNOSIS — R079 Chest pain, unspecified: Secondary | ICD-10-CM

## 2021-06-14 MED ORDER — NITROGLYCERIN 0.4 MG SL SUBL: 0.4000 mg | SUBLINGUAL_TABLET | SUBLINGUAL | 3 refills | Status: DC | PRN

## 2021-06-14 MED ORDER — METOPROLOL TARTRATE 100 MG PO TABS: ORAL_TABLET | ORAL | 0 refills | Status: DC

## 2021-06-14 MED ORDER — METOPROLOL SUCCINATE ER 25 MG PO TB24: 12.5000 mg | ORAL_TABLET | Freq: Every day | ORAL | 3 refills | Status: DC

## 2021-06-14 NOTE — Progress Notes (Signed)
Cardiology Office Note:    Date:  06/19/2021   ID:  Erika [name], DOB June 23, 1955, MRN [id number]  PCP:  Isaac Bliss, Rayford Halsted, MD  Cardiologist:  Berniece Salines, DO  Electrophysiologist:  None   Referring MD: Isaac Bliss, [place]*   " I have been experiencing chest pain"  History of Present Illness:    Erika [name] is a 66 y.o. female with a hx of hypertension, hyperlipidemia, presents for follow-up visit.  At her last visit she was experiencing significant palpitations so we placed a monitor on the patient which did show evidence of paroxysmal SVT.  Since she has been started on a beta-blocker she has had improvement on her palpitations.  But she noticed that she is having significant intermittent chest discomfort and shortness of breath.  She describes the chest pain as a midsternal sensation which sometimes radiates to the left side.  Is always associated with shortness of breath.  It is mostly on exertion.  She has recently noticed some Ampres.  She is concerned about this given her family history of coronary artery disease.    Past Medical History:  Diagnosis Date   Anxiety    Arthritis    Bell's palsy    COPD (chronic obstructive pulmonary disease) ([place])    History of cardiac murmur as a child    Hyperlipidemia    Hypertension    Menopausal syndrome    Mild asthma    no inhalers since stopped smoking   Myocardial infarction ([place])    2020--pt states no damage   Neuroma of foot    OSA on CPAP    Wears glasses     Past Surgical History:  Procedure Laterality Date   ELBOW SURGERY Right 2002   repair tendon and nerve injury   EXCISION MORTON'S NEUROMA Left 2010   ROBOTIC ASSISTED TOTAL HYSTERECTOMY WITH BILATERAL SALPINGO OOPHERECTOMY N/A 12/31/2017   Procedure: XI ROBOTIC ASSISTED TOTAL HYSTERECTOMY WITH BILATERAL SALPINGO OOPHORECTOMY;  Surgeon: Everitt Amber, MD;  Location: [facility];  Service: Gynecology;  Laterality: N/A;   SHOULDER ARTHROSCOPY W/ ROTATOR CUFF  REPAIR Left 09/2016   and burectomy    Current Medications: Current Meds  Medication Sig   acetaminophen (TYLENOL) 650 MG CR tablet Take 650-1,300 mg by mouth every 8 (eight) hours as needed for pain.   atorvastatin (LIPITOR) 40 MG tablet Take 1 tablet (40 mg total) by mouth daily.   buPROPion (WELLBUTRIN XL) 150 MG 24 hr tablet Take 1 tablet (150 mg total) by mouth daily.   cetirizine (ZYRTEC) 10 MG tablet Take 10 mg by mouth daily as needed for allergies.   Cholecalciferol (VITAMIN D3 SUPER STRENGTH) 50 MCG (2000 UT) CAPS Take 2,000 Units by mouth.   citalopram (CELEXA) 40 MG tablet Take 2 tablets (80 mg total) by mouth at bedtime.   cyanocobalamin (,VITAMIN B-12,) 1000 MCG/ML injection INJECT 1 ML (1,000 MCG TOTAL) INTO THE MUSCLE EVERY 30 DAYS.   hydrochlorothiazide (HYDRODIURIL) 25 MG tablet Take 1 tablet (25 mg total) by mouth daily.   HYDROcodone bit-homatropine (HYCODAN) 5-1.5 MG/5ML syrup Take 5 mLs by mouth every 4 (four) hours as needed for cough.   levothyroxine (SYNTHROID) 75 MCG tablet Take 1 tablet (75 mcg total) by mouth daily before breakfast.   metoprolol tartrate (LOPRESSOR) 100 MG tablet Take 2 hours prior to CT   nitroGLYCERIN (NITROSTAT) 0.4 MG SL tablet Place 1 tablet (0.4 mg total) under the tongue every 5 (five) minutes as needed for chest pain.  Up to 3 times.   SYRINGE-NEEDLE, DISP, 3 ML (BD SAFETYGLIDE SYRINGE/NEEDLE) 25G X 1" 3 ML MISC Use for B12 injections   [DISCONTINUED] metoprolol succinate (TOPROL XL) 25 MG 24 hr tablet Take 0.5 tablets (12.5 mg total) by mouth daily.     Allergies:   Tape, Tetracycline, and Tetracycline hcl   Social History   Socioeconomic History   Marital status: Married    Spouse name: Not on file   Number of children: Not on file   Years of education: Not on file   Highest education level: Not on file  Occupational History   Occupation: Solicitor: [employer]  Tobacco Use   Smoking status: Former     Packs/day: 1.00    Years: 30.00    Pack years: 30.00    Types: Cigarettes    Quit date: 06/01/2000    Years since quitting: 21.0   Smokeless tobacco: Never  Vaping Use   Vaping Use: Never used  Substance and Sexual Activity   Alcohol use: No   Drug use: Never   Sexual activity: Not Currently    Birth control/protection: Post-menopausal  Other Topics Concern   Not on file  Social History Narrative   Not on file   Social Determinants of Health   Financial Resource Strain: Not on file  Food Insecurity: Not on file  Transportation Needs: Not on file  Physical Activity: Not on file  Stress: Not on file  Social Connections: Not on file     Family History: The patient's family history includes Asthma in her sister; Cancer in her brother, maternal aunt, maternal uncle, and paternal grandmother; Diabetes in her maternal grandfather, maternal grandmother, and mother; Heart disease in her mother; Uterine cancer in her maternal uncle. There is no history of Colon cancer, Colon polyps, Esophageal cancer, Rectal cancer, or Stomach cancer.  ROS:   Review of Systems  Constitution: Negative for decreased appetite, fever and weight gain.  HENT: Negative for congestion, ear discharge, hoarse voice and sore throat.   Eyes: Negative for discharge, redness, vision loss in right eye and visual halos.  Cardiovascular: Reports r chest pain, dyspnea on exertion.  Negative for leg swelling, orthopnea and palpitations.  Respiratory: Negative for cough, hemoptysis, shortness of breath and snoring.   Endocrine: Negative for heat intolerance and polyphagia.  Hematologic/Lymphatic: Negative for bleeding problem. Does not bruise/bleed easily.  Skin: Negative for flushing, nail changes, rash and suspicious lesions.  Musculoskeletal: Negative for arthritis, joint pain, muscle cramps, myalgias, neck pain and stiffness.  Gastrointestinal: Negative for abdominal pain, bowel incontinence, diarrhea and excessive  appetite.  Genitourinary: Negative for decreased libido, genital sores and incomplete emptying.  Neurological: Negative for brief paralysis, focal weakness, headaches and loss of balance.  Psychiatric/Behavioral: Negative for altered mental status, depression and suicidal ideas.  Allergic/Immunologic: Negative for HIV exposure and persistent infections.    EKGs/Labs/Other Studies Reviewed:    The following studies were reviewed today:   EKG: None today  Patch Wear Time:  14 days and 0 hours starting February 10, 2021. Indication:   Patient had a min HR of 53 bpm, max HR of 162 bpm, and avg HR of 77 bpm. Predominant underlying rhythm was Sinus Rhythm.    9 Supraventricular Tachycardia runs occurred, the run with the fastest interval lasting 5 beats with a max rate of 162 bpm, the longest lasting 8 beats with an avg rate of 126 bpm. Isolated SVEs were rare (<1.0%), SVE  Couplets were rare (<1.0%), and SVE Triplets were rare (<1.0%). Isolated VEs were rare (<1.0%), and no VE Couplets or VE Triplets were present.    Symptoms associated with sinus rhythm and sinus tachycardia.   Conclusion: This study is remarkable for rare paroxysmal supraventricular tachycardia.   Transthoracic echocardiogram March 02, 2021 IMPRESSIONS     1. Left ventricular ejection fraction, by estimation, is 60 to 65%. The  left ventricle has normal function. The left ventricle has no regional  wall motion abnormalities. Left ventricular diastolic parameters were  normal. The average left ventricular  global longitudinal strain is -26.6 %. The global longitudinal strain is  normal.   2. Right ventricular systolic function is normal. The right ventricular  size is normal. Tricuspid regurgitation signal is inadequate for assessing  PA pressure.   3. The mitral valve is normal in structure. Trivial mitral valve  regurgitation. No evidence of mitral stenosis.   4. The aortic valve is tricuspid. Aortic valve  regurgitation is not  visualized. No aortic stenosis is present.   5. The inferior vena cava is normal in size with greater than 50%  respiratory variability, suggesting right atrial pressure of 3 mmHg.   FINDINGS   Left Ventricle: Left ventricular ejection fraction, by estimation, is 60  to 65%. The left ventricle has normal function. The left ventricle has no  regional wall motion abnormalities. The average left ventricular global  longitudinal strain is -26.6 %.  The global longitudinal strain is normal. The left ventricular internal  cavity size was normal in size. There is no left ventricular hypertrophy.  Left ventricular diastolic parameters were normal.   Right Ventricle: The right ventricular size is normal. No increase in  right ventricular wall thickness. Right ventricular systolic function is  normal. Tricuspid regurgitation signal is inadequate for assessing PA  pressure.   Left Atrium: Left atrial size was normal in size.   Right Atrium: Right atrial size was normal in size.   Pericardium: Trivial pericardial effusion is present.   Mitral Valve: The mitral valve is normal in structure. Trivial mitral  valve regurgitation. No evidence of mitral valve stenosis.   Tricuspid Valve: The tricuspid valve is normal in structure. Tricuspid  valve regurgitation is not demonstrated.   Aortic Valve: The aortic valve is tricuspid. Aortic valve regurgitation is  not visualized. No aortic stenosis is present.   Pulmonic Valve: The pulmonic valve was normal in structure. Pulmonic valve  regurgitation is trivial.   Aorta: The aortic root is normal in size and structure.   Venous: The inferior vena cava is normal in size with greater than 50%  respiratory variability, suggesting right atrial pressure of 3 mmHg.   IAS/Shunts: No atrial level shunt detected by color flow Doppler.   Recent Labs: 01/02/2021: ALT 21; BUN 14; Creatinine, Ser 0.81; Hemoglobin 12.9; Platelets 326;  Potassium 3.6; Sodium 139  Recent Lipid Panel    Component Value Date/Time   CHOL 155 09/22/2020 0759   TRIG 110.0 09/22/2020 0759   HDL 61.70 09/22/2020 0759   CHOLHDL 3 09/22/2020 0759   VLDL 22.0 09/22/2020 0759   LDLCALC 71 09/22/2020 0759   LDLDIRECT 151.7 03/18/2013 0824    Physical Exam:    VS:  BP 130/80 (BP Location: Left Arm)    Pulse 66    Ht 5\' 6"  (1.676 m)    Wt 241 lb 9.6 oz (109.6 kg)    SpO2 97%    BMI 39.00 kg/m     Wt  Readings from Last 3 Encounters:  06/14/21 241 lb 9.6 oz (109.6 kg)  04/11/21 235 lb (106.6 kg)  02/08/21 236 lb 3.2 oz (107.1 kg)     GEN: Well nourished, well developed in no acute distress HEENT: Normal NECK: No JVD; No carotid bruits LYMPHATICS: No lymphadenopathy CARDIAC: S1S2 noted,RRR, no murmurs, rubs, gallops RESPIRATORY:  Clear to auscultation without rales, wheezing or rhonchi  ABDOMEN: Soft, non-tender, non-distended, +bowel sounds, no guarding. EXTREMITIES: No edema, No cyanosis, no clubbing MUSCULOSKELETAL:  No deformity  SKIN: Warm and dry NEUROLOGIC:  Alert and oriented x 3, non-focal PSYCHIATRIC:  Normal affect, good insight  ASSESSMENT:    1. Chest pain, unspecified type   2. Medication management   3. DOE (dyspnea on exertion)   4. PSVT (paroxysmal supraventricular tachycardia) ([place])   5. Family history of premature CAD   6. Hypertensive heart disease without heart failure   7. Morbid obesity ([place])    PLAN:      The symptoms chest pain is concerning, this patient does have intermediate risk for coronary artery disease and at this time I would like to pursue an ischemic evaluation in this patient.  Shared decision a coronary CTA at this time is appropriate.  I have discussed with the patient about the testing.  The patient has no IV contrast allergy and is agreeable to proceed with this test.  Sublingual nitroglycerin prescription was sent, its protocol and 911 protocol explained and the patient vocalized  understanding questions were answered to the patient's satisfaction  Paroxysmal SVT-we will continue her low-dose of Toprol XL as these have improved her palpitations.  Hypertension-blood pressure is acceptable, continue with current antihypertensive regimen.  Insert weight loss  The patient is in agreement with the above plan. The patient left the office in stable condition.  The patient will follow up in 6 months or sooner if needed.   Medication Adjustments/Labs and Tests Ordered: Current medicines are reviewed at length with the patient today.  Concerns regarding medicines are outlined above.  Orders Placed This Encounter  Procedures   CT CORONARY MORPH W/CTA COR W/SCORE W/CA W/CM &/OR WO/CM   Basic Metabolic Panel (BMET)   Magnesium   Meds ordered this encounter  Medications   nitroGLYCERIN (NITROSTAT) 0.4 MG SL tablet    Sig: Place 1 tablet (0.4 mg total) under the tongue every 5 (five) minutes as needed for chest pain. Up to 3 times.    Dispense:  90 tablet    Refill:  3   metoprolol succinate (TOPROL XL) 25 MG 24 hr tablet    Sig: Take 0.5 tablets (12.5 mg total) by mouth daily.    Dispense:  45 tablet    Refill:  3   metoprolol tartrate (LOPRESSOR) 100 MG tablet    Sig: Take 2 hours prior to CT    Dispense:  1 tablet    Refill:  0    Patient Instructions  Medication Instructions:  Your physician has recommended you make the following change in your medication:  START: Nitroglycerin 0.4 mg take one tablet by mouth every 5 minutes up to three times as needed for chest pain.  *If you need a refill on your cardiac medications before your next appointment, please call your pharmacy*   Lab Work: Your physician recommends that you return for lab work in:  3-7 days before CT scan: BMET, Mag If you have labs (blood work) drawn today and your tests are completely normal, you will receive your results  only by: [employer] (if you have MyChart) OR A paper copy in the  mail If you have any lab test that is abnormal or we need to change your treatment, we will call you to review the results.   Testing/Procedures:   Your cardiac CT will be scheduled at one of the below locations:   [facility] [address] [place], [place] [address] [phone]   If scheduled at [facility], please arrive at the [facility] and Children's Entrance (Entrance C2) of [facility] 30 minutes prior to test start time. You can use the FREE valet parking offered at entrance C (encouraged to control the heart rate for the test)  Proceed to the [facility] Radiology Department (first floor) to check-in and test prep.  All radiology patients and guests should use entrance C2 at [facility], accessed from [facility], even though the hospital's physical address listed is [address]     Please follow these instructions carefully (unless otherwise directed):   On the Night Before the Test: Be sure to Drink plenty of water. Do not consume any caffeinated/decaffeinated beverages or chocolate 12 hours prior to your test. Do not take any antihistamines 12 hours prior to your test.   On the Day of the Test: Drink plenty of water until 1 hour prior to the test. Do not eat any food 4 hours prior to the test. You may take your regular medications prior to the test.  Take metoprolol (Lopressor) two hours prior to test. HOLD Hydrochlorothiazide morning of the test. HOLD Metoprolol succinate (Toprol-XL) the day of the test.  FEMALES- please wear underwire-free bra if available, avoid dresses & tight clothing      After the Test: Drink plenty of water. After receiving IV contrast, you may experience a mild flushed feeling. This is normal. On occasion, you may experience a mild rash up to 24 hours after the test. This is not dangerous. If this occurs, you can take Benadryl 25 mg and increase your fluid intake. If you  experience trouble breathing, this can be serious. If it is severe call 911 IMMEDIATELY. If it is mild, please call our office. If you take any of these medications: Glipizide/Metformin, Avandament, Glucavance, please do not take 48 hours after completing test unless otherwise instructed.  We will call to schedule your test 2-4 weeks out understanding that some insurance companies will need an authorization prior to the service being performed.   For non-scheduling related questions, please contact the cardiac imaging nurse navigator should you have any questions/concerns: Marchia Bond, Cardiac Imaging Nurse Navigator Gordy Clement, Cardiac Imaging Nurse Navigator [facility] Direct Office Dial: [phone]   For scheduling needs, including cancellations and rescheduling, please call [place], [phone].    Follow-Up: At [facility], you and your health needs are our priority.  As part of our continuing mission to provide you with exceptional heart care, we have created designated Provider Care Teams.  These Care Teams include your primary Cardiologist (physician) and Advanced Practice Providers (APPs -  Physician Assistants and Nurse Practitioners) who all work together to provide you with the care you need, when you need it.  We recommend signing up for the patient portal called "MyChart".  Sign up information is provided on this After Visit Summary.  MyChart is used to connect with patients for Virtual Visits (Telemedicine).  Patients are able to view lab/test results, encounter notes, upcoming appointments, etc.  Non-urgent messages can be sent to your provider as well.   To learn more about what you can do with MyChart, go to [email or web address].    Your next appointment:   6 month(s)  The format for your next appointment:   In Person  Provider:   Berniece Salines, DO     Adopting a Healthy Lifestyle.  Know what a healthy weight is for you  (roughly BMI <25) and aim to maintain this   Aim for 7+ servings of fruits and vegetables daily   65-80+ fluid ounces of water or unsweet tea for healthy kidneys   Limit to max 1 drink of alcohol per day; avoid smoking/tobacco   Limit animal fats in diet for cholesterol and heart health - choose grass fed whenever available   Avoid highly processed foods, and foods high in saturated/trans fats   Aim for low stress - take time to unwind and care for your mental health   Aim for 150 min of moderate intensity exercise weekly for heart health, and weights twice weekly for bone health   Aim for 7-9 hours of sleep daily   When it comes to diets, agreement about the perfect plan isnt easy to find, even among the experts. Experts at the [employer] developed an idea known as the Healthy Eating Plate. Just imagine a plate divided into logical, healthy portions.   The emphasis is on diet quality:   Load up on vegetables and fruits - one-half of your plate: Aim for color and variety, and remember that potatoes dont count.   Go for whole grains - one-quarter of your plate: Whole wheat, barley, wheat berries, quinoa, oats, brown rice, and foods made with them. If you want pasta, go with whole wheat pasta.   Protein power - one-quarter of your plate: Fish, chicken, beans, and nuts are all healthy, versatile protein sources. Limit red meat.   The diet, however, does go beyond the plate, offering a few other suggestions.   Use healthy plant oils, such as olive, canola, soy, corn, sunflower and peanut. Check the labels, and avoid partially hydrogenated oil, which have unhealthy trans fats.   If youre thirsty, drink water. Coffee and tea are good in moderation, but skip sugary drinks and limit milk and dairy products to one or two daily servings.   The type of carbohydrate in the diet is more important than the amount. Some sources of carbohydrates, such as vegetables, fruits,  whole grains, and beans-are healthier than others.   Finally, stay active  Signed, Berniece Salines, DO  06/19/2021 11:43 AM    [place]

## 2021-06-14 NOTE — Patient Instructions (Addendum)
Medication Instructions:  ?Your physician has recommended you make the following change in your medication:  ?START: Nitroglycerin 0.4 mg take one tablet by mouth every 5 minutes up to three times as needed for chest pain.  ?*If you need a refill on your cardiac medications before your next appointment, please call your pharmacy* ? ? ?Lab Work: ?Your physician recommends that you return for lab work in:  ?3-7 days before CT scan: BMET, [place] ?If you have labs (blood work) drawn today and your tests are completely normal, you will receive your results only by: ?MyChart Message (if you have MyChart) OR ?A paper copy in the mail ?If you have any lab test that is abnormal or we need to change your treatment, we will call you to review the results. ? ? ?Testing/Procedures: ? ? ?Your cardiac CT will be scheduled at one of the below locations:  ? ?[facility] ?[address] ?[place], [place] [address] ?[phone] ? ? ?If scheduled at [facility], please arrive at the [facility] and Children's Entrance (Entrance C2) of [facility] 30 minutes prior to test start time. ?You can use the FREE valet parking offered at entrance C (encouraged to control the heart rate for the test)  ?Proceed to the [facility] Radiology Department (first floor) to check-in and test prep. ? ?All radiology patients and guests should use entrance C2 at [facility], accessed from [facility], even though the hospital's physical address listed is [address] ? ? ? ? ?Please follow these instructions carefully (unless otherwise directed): ? ? ?On the Night Before the Test: ?Be sure to Drink plenty of water. ?Do not consume any caffeinated/decaffeinated beverages or chocolate 12 hours prior to your test. ?Do not take any antihistamines 12 hours prior to your test. ? ? ?On the Day of the Test: ?Drink plenty of water until 1 hour prior to the test. ?Do not eat any food 4 hours prior to the  test. ?You may take your regular medications prior to the test.  ?Take metoprolol (Lopressor) two hours prior to test. ?HOLD Hydrochlorothiazide morning of the test. ?HOLD Metoprolol succinate (Toprol-XL) the day of the test.  ?FEMALES- please wear underwire-free bra if available, avoid dresses & tight clothing ?     ?After the Test: ?Drink plenty of water. ?After receiving IV contrast, you may experience a mild flushed feeling. This is normal. ?On occasion, you may experience a mild rash up to 24 hours after the test. This is not dangerous. If this occurs, you can take Benadryl 25 mg and increase your fluid intake. ?If you experience trouble breathing, this can be serious. If it is severe call 911 IMMEDIATELY. If it is mild, please call our office. ?If you take any of these medications: Glipizide/Metformin, Avandament, Glucavance, please do not take 48 hours after completing test unless otherwise instructed. ? ?We will call to schedule your test 2-4 weeks out understanding that some insurance companies will need an authorization prior to the service being performed.  ? ?For non-scheduling related questions, please contact the cardiac imaging nurse navigator should you have any questions/concerns: ?Marchia Bond, Cardiac Imaging Nurse Navigator ?Gordy Clement, Cardiac Imaging Nurse Navigator ?[facility] ?Direct Office Dial: [phone]  ? ?For scheduling needs, including cancellations and rescheduling, please call [place], [phone]. ? ? ? ?Follow-Up: ?At [facility], you and your health needs are our priority.  As part of our continuing mission to provide you  with exceptional heart care, we have created designated Provider Care Teams.  These Care Teams include your primary Cardiologist (physician) and Advanced Practice Providers (APPs -  Physician Assistants and Nurse Practitioners) who all work together to provide you with the care you need, when you need it. ? ?We recommend  signing up for the patient portal called "MyChart".  Sign up information is provided on this After Visit Summary.  MyChart is used to connect with patients for Virtual Visits (Telemedicine).  Patients are able to view lab/test results, encounter notes, upcoming appointments, etc.  Non-urgent messages can be sent to your provider as well.   ?To learn more about what you can do with MyChart, go to [email or web address].   ? ?Your next appointment:   ?6 month(s) ? ?The format for your next appointment:   ?In Person ? ?Provider:   ?Berniece Salines, DO   ?

## 2021-06-15 ENCOUNTER — Other Ambulatory Visit: Payer: Self-pay | Admitting: Cardiology

## 2021-06-26 ENCOUNTER — Telehealth: Payer: Self-pay | Admitting: Internal Medicine

## 2021-06-26 ENCOUNTER — Telehealth (HOSPITAL_COMMUNITY): Payer: Self-pay | Admitting: *Deleted

## 2021-06-26 NOTE — Telephone Encounter (Signed)
---  Pt states she has been sick since last Wednesday, ?then she had a positive RAT for Covid today. Pt c/o ?HA, body aches, fatigue, cough, and had a fever but it ?is gone now. ? ? ?06/26/2021 12:48:55 PM Call EMS 911 Now Adrian Blackwater, RN, [place] ? ?06/26/21 1545: Pt says she had EMS come to her home for her to be evaluated. States symptoms began 3/8-3/9. Pt endorses weakness, productive cough, body aches. Pt states she no longer has SOB & thinks she got herself worked up b/c she's never had Covid before. She has an inhaler for use if needed. Pt has had 3 covid vaccines with last one 03/04/20. Offered pt virtual visit, pt declines. Discussed current isolation guidelines & advised pt to schedule virtual visit today or tomorrow if needed, in person visit after that if symptoms worsen or persist. Pt verb understanding. ?

## 2021-06-26 NOTE — Telephone Encounter (Signed)
Reaching out to patient to offer assistance regarding upcoming cardiac imaging study; pt verbalizes understanding of appt date/time, but states she is not feeling well and would like to reschedule.  New appointment made for March 22 at 3:15pm.  She is aware she will need to arrive at 2:45pm and to obtain blood work prior to that appointment. ? ?Gordy Clement RN Navigator Cardiac Imaging ?[facility] ?[phone] office ?[phone] cell ? ?

## 2021-06-26 NOTE — Telephone Encounter (Signed)
Tested positive for COVID this morning,  ?Feeling really weak and SOB. Transferred patient to Triage nurse for advice. ?

## 2021-06-28 ENCOUNTER — Ambulatory Visit (HOSPITAL_COMMUNITY): Admission: RE | Admit: 2021-06-28 | Payer: Medicare Other | Source: Ambulatory Visit

## 2021-07-04 ENCOUNTER — Telehealth (HOSPITAL_COMMUNITY): Payer: Self-pay | Admitting: Emergency Medicine

## 2021-07-04 DIAGNOSIS — Z79899 Other long term (current) drug therapy: Secondary | ICD-10-CM | POA: Diagnosis not present

## 2021-07-04 LAB — BASIC METABOLIC PANEL
BUN/Creatinine Ratio: 20 (ref 12–28)
BUN: 16 mg/dL (ref 8–27)
CO2: 28 mmol/L (ref 20–29)
Calcium: 9.5 mg/dL (ref 8.7–10.3)
Chloride: 101 mmol/L (ref 96–106)
Creatinine, Ser: 0.82 mg/dL (ref 0.57–1.00)
Glucose: 89 mg/dL (ref 70–99)
Potassium: 3.9 mmol/L (ref 3.5–5.2)
Sodium: 142 mmol/L (ref 134–144)
eGFR: 79 mL/min/{1.73_m2} (ref >59)

## 2021-07-04 LAB — MAGNESIUM: Magnesium: 2.3 mg/dL (ref 1.6–2.3)

## 2021-07-04 NOTE — Telephone Encounter (Signed)
Reaching out to patient to offer assistance regarding upcoming cardiac imaging study; pt verbalizes understanding of appt date/time, parking situation and where to check in, pre-test NPO status and medications ordered, and verified current allergies; name and call back number provided for further questions should they arise ?Marchia Bond RN Navigator Cardiac Imaging ?[facility] ?[phone] office ?[phone] cell ? ?'100mg'$  metoprolol  ?Arrival 245 ?

## 2021-07-05 ENCOUNTER — Other Ambulatory Visit: Payer: Self-pay

## 2021-07-05 ENCOUNTER — Ambulatory Visit (HOSPITAL_COMMUNITY)
Admission: RE | Admit: 2021-07-05 | Discharge: 2021-07-05 | Disposition: A | Discharge status: Home / Self Care | Payer: Medicare Other | Source: Ambulatory Visit | Attending: Internal Medicine | Admitting: Internal Medicine

## 2021-07-05 ENCOUNTER — Ambulatory Visit (HOSPITAL_COMMUNITY)
Admission: RE | Admit: 2021-07-05 | Discharge: 2021-07-05 | Disposition: A | Discharge status: Home / Self Care | Payer: Medicare Other | Source: Ambulatory Visit | Attending: Cardiology | Admitting: Cardiology

## 2021-07-05 ENCOUNTER — Ambulatory Visit (HOSPITAL_BASED_OUTPATIENT_CLINIC_OR_DEPARTMENT_OTHER)
Admission: RE | Admit: 2021-07-05 | Discharge: 2021-07-05 | Disposition: A | Discharge status: Home / Self Care | Payer: Medicare Other | Source: Ambulatory Visit | Attending: Internal Medicine | Admitting: Internal Medicine

## 2021-07-05 DIAGNOSIS — R079 Chest pain, unspecified: Secondary | ICD-10-CM | POA: Diagnosis not present

## 2021-07-05 DIAGNOSIS — R931 Abnormal findings on diagnostic imaging of heart and coronary circulation: Secondary | ICD-10-CM | POA: Diagnosis not present

## 2021-07-05 DIAGNOSIS — I2510 Atherosclerotic heart disease of native coronary artery without angina pectoris: Secondary | ICD-10-CM | POA: Diagnosis not present

## 2021-07-05 DIAGNOSIS — R0609 Other forms of dyspnea: Secondary | ICD-10-CM | POA: Insufficient documentation

## 2021-07-05 DIAGNOSIS — I119 Hypertensive heart disease without heart failure: Secondary | ICD-10-CM | POA: Diagnosis not present

## 2021-07-05 MED ORDER — NITROGLYCERIN 0.4 MG SL SUBL
SUBLINGUAL_TABLET | SUBLINGUAL | Status: AC
  Filled 2021-07-05: qty 2

## 2021-07-05 MED ORDER — IOHEXOL 350 MG/ML SOLN
95.0000 mL | Freq: Once | INTRAVENOUS | Status: AC | PRN
  Administered 2021-07-05: 95 mL via INTRAVENOUS

## 2021-07-05 MED ORDER — NITROGLYCERIN 0.4 MG SL SUBL
0.8000 mg | SUBLINGUAL_TABLET | Freq: Once | SUBLINGUAL | Status: AC
  Administered 2021-07-05: 0.8 mg via SUBLINGUAL

## 2021-07-06 ENCOUNTER — Telehealth (INDEPENDENT_AMBULATORY_CARE_PROVIDER_SITE_OTHER): Payer: Medicare Other | Admitting: Cardiology

## 2021-07-06 ENCOUNTER — Encounter: Payer: Self-pay | Admitting: Cardiology

## 2021-07-06 ENCOUNTER — Telehealth: Payer: Self-pay

## 2021-07-06 VITALS — Ht 67.0 in | Wt 242.0 lb

## 2021-07-06 DIAGNOSIS — Z79899 Other long term (current) drug therapy: Secondary | ICD-10-CM

## 2021-07-06 DIAGNOSIS — R9439 Abnormal result of other cardiovascular function study: Secondary | ICD-10-CM | POA: Diagnosis not present

## 2021-07-06 DIAGNOSIS — I2510 Atherosclerotic heart disease of native coronary artery without angina pectoris: Secondary | ICD-10-CM | POA: Diagnosis not present

## 2021-07-06 DIAGNOSIS — E669 Obesity, unspecified: Secondary | ICD-10-CM | POA: Diagnosis not present

## 2021-07-06 DIAGNOSIS — I10 Essential (primary) hypertension: Secondary | ICD-10-CM

## 2021-07-06 NOTE — Telephone Encounter (Signed)
Called pt to try to set up cath. No answer at this time. Left message for her to return the call.  ?

## 2021-07-06 NOTE — Addendum Note (Signed)
Addended by: Orvan July on: 07/06/2021 01:37 PM ? ? Modules accepted: Orders ? ?

## 2021-07-06 NOTE — Progress Notes (Signed)
? ?Virtual Visit via Video Note  ? ?This visit type was conducted due to national recommendations for restrictions regarding the COVID-19 Pandemic (e.g. social distancing) in an effort to limit this patient's exposure and mitigate transmission in our community.  Due to her co-morbid illnesses, this patient is at least at moderate risk for complications without adequate follow up.  This format is felt to be most appropriate for this patient at this time.  All issues noted in this document were discussed and addressed.  A limited physical exam was performed with this format.  Please refer to the patient's chart for her consent to telehealth for [facility]. ? ?   ? ?Date:  07/06/2021  ? ?ID:  Erika [name], DOB 10/03/1955, MRN [id number] ? ?Patient Location: Home ?Provider Location: Office/Clinic ? ?PCP:  Isaac Bliss, Rayford Halsted, MD  ?Cardiologist:  Berniece Salines, DO  ?Electrophysiologist:  None  ? ?Evaluation Performed:  Follow-Up Visit ? ?Chief Complaint:  " I am nervous" ? ?History of Present Illness:   ? ?Erika [name] is a 66 y.o. female with coronary artery disease recently seen on coronary CTA, hypertension, hyperlipidemia paroxysmal SVT here today for follow-up visit.  At her last visit she did tell me she had been experiencing new onset chest pain which she describes and sounded anginal therefore I sent the patient for coronary CTA.  Coronary CTA comes back with severe LAD lesion and FFR being abnormal. ? ?She tells me she still is experiencing intermittent chest discomfort that she reported in the beginning of the month. ? ?The patient does not have symptoms concerning for COVID-19 infection (fever, chills, cough, or new shortness of breath).  ? ? ?Past Medical History:  ?Diagnosis Date  ? Anxiety   ? Arthritis   ? Bell's palsy   ? COPD (chronic obstructive pulmonary disease) ([place])   ? History of cardiac murmur as a child   ? Hyperlipidemia   ? Hypertension   ? Menopausal syndrome   ? Mild asthma   ? no  inhalers since stopped smoking  ? Myocardial infarction [facility])   ? 2020--pt states no damage  ? Neuroma of foot   ? OSA on CPAP   ? Wears glasses   ? ?Past Surgical History:  ?Procedure Laterality Date  ? ELBOW SURGERY Right 2002  ? repair tendon and nerve injury  ? EXCISION MORTON'S NEUROMA Left 2010  ? ROBOTIC ASSISTED TOTAL HYSTERECTOMY WITH BILATERAL SALPINGO OOPHERECTOMY N/A 12/31/2017  ? Procedure: XI ROBOTIC ASSISTED TOTAL HYSTERECTOMY WITH BILATERAL SALPINGO OOPHORECTOMY;  Surgeon: Everitt Amber, MD;  Location: [facility];  Service: Gynecology;  Laterality: N/A;  ? SHOULDER ARTHROSCOPY W/ ROTATOR CUFF REPAIR Left 09/2016  ? and burectomy  ?  ? ?Current Meds  ?Medication Sig  ? acetaminophen (TYLENOL) 650 MG CR tablet Take 650-1,300 mg by mouth every 8 (eight) hours as needed for pain.  ? atorvastatin (LIPITOR) 40 MG tablet Take 1 tablet (40 mg total) by mouth daily.  ? buPROPion (WELLBUTRIN XL) 150 MG 24 hr tablet Take 1 tablet (150 mg total) by mouth daily.  ? cetirizine (ZYRTEC) 10 MG tablet Take 10 mg by mouth daily as needed for allergies.  ? Cholecalciferol (VITAMIN D3 SUPER STRENGTH) 50 MCG (2000 UT) CAPS Take 2,000 Units by mouth.  ? citalopram (CELEXA) 40 MG tablet Take 2 tablets (80 mg total) by mouth at bedtime.  ? cyanocobalamin (,VITAMIN B-12,) 1000 MCG/ML injection INJECT 1 ML (1,000 MCG TOTAL) INTO THE MUSCLE EVERY 30  DAYS.  ? hydrochlorothiazide (HYDRODIURIL) 25 MG tablet Take 1 tablet (25 mg total) by mouth daily.  ? levothyroxine (SYNTHROID) 75 MCG tablet Take 1 tablet (75 mcg total) by mouth daily before breakfast.  ? metoprolol succinate (TOPROL XL) 25 MG 24 hr tablet Take 0.5 tablets (12.5 mg total) by mouth daily.  ? metoprolol tartrate (LOPRESSOR) 100 MG tablet Take 2 hours prior to CT  ? nitroGLYCERIN (NITROSTAT) 0.4 MG SL tablet Place 1 tablet (0.4 mg total) under the tongue every 5 (five) minutes as needed for chest pain. Up to 3 times.  ? SYRINGE-NEEDLE, DISP, 3 ML (BD SAFETYGLIDE  SYRINGE/NEEDLE) 25G X 1" 3 ML MISC Use for B12 injections  ?  ? ?Allergies:   Tape, Tetracycline, and Tetracycline hcl  ? ?Social History  ? ?Tobacco Use  ? Smoking status: Former  ?  Packs/day: 1.00  ?  Years: 30.00  ?  Pack years: 30.00  ?  Types: Cigarettes  ?  Quit date: 06/01/2000  ?  Years since quitting: 21.1  ? Smokeless tobacco: Never  ?Vaping Use  ? Vaping Use: Never used  ?Substance Use Topics  ? Alcohol use: No  ? Drug use: Never  ?  ? ?Family Hx: ?The patient's family history includes Asthma in her sister; Cancer in her brother, maternal aunt, maternal uncle, and paternal grandmother; Diabetes in her maternal grandfather, maternal grandmother, and mother; Heart disease in her mother; Uterine cancer in her maternal uncle. There is no history of Colon cancer, Colon polyps, Esophageal cancer, Rectal cancer, or Stomach cancer. ? ?ROS:   ?Please see the history of present illness.    ? ?All other systems reviewed and are negative. ? ? ?Prior CV studies:   ?The following studies were reviewed today: ? ?FFRCT 07/05/2021 ? 1. Left Main: No significant functional stenosis, CT-FFR 0.99. ?  ?2. LAD: Significant functional stenosis, CT-FFR 0.85-> 0.80 mid LAD, ?0.97 -> 0.71 D2. D1 is too small to be modeled. ?3. LCX: No significant functional stenosis, CT-FFR 0.90. ?4. RCA: No significant functional stenosis, CT-FFR 0.86. ?  ?IMPRESSION: ?1. CT FFR analysis shows evidence of significant functional stenosis ?mid LAD and D2. ?  ?[name] MD ?  ?  ?Electronically Signed ?  By: [name] M.D. ?  On: 07/05/2021 21:31 ?  ? ?CCTA 07/05/2021 ?Aorta:  Normal size.  No calcifications. ?  ?Main Pulmonary Artery: Normal size of the pulmonary artery. ?  ?Aortic Valve:  Tri-leaflet.  No calcifications. ?  ?Coronary Arteries:  Normal coronary origin.  Right dominance. ?  ?Coronary Calcium Score: ?  ?Left main: 13 ?  ?Left anterior descending artery: 258 ?  ?Left circumflex artery: 3 ?  ?Right coronary  artery: 321 ?  ?Total: 595 ?  ?Percentile: 96th for age, sex, and race matched control. ?  ?RCA is a large dominant artery that gives rise to PDA and PLA. ?Minimal calcified ostial plaque. Mild calcified plaque mid and ?distal vessel. ?  ?Left main is a large artery that gives rise to LAD and LCX arteries. ?Minimal calcified plaque distal vessel. ?  ?LAD is a large vessel that gives rise to one large D1 Branch. ?Moderate mixed calcified stenosis mid LAD. Severe calcified stenosis ?proximal D2. Severe stenosis proximal D1 (small vessel). ?  ?LCX is a non-dominant artery that gives rise to one large OM1 ?branch. Minimal calcified mid vessel plaque. ?  ?Other findings: ?  ?Normal pulmonary vein drainage into the left atrium. ?  ?Normal  left atrial appendage without a thrombus. ?  ?Extra-cardiac findings: See attached radiology report for ?non-cardiac structures. ?  ?IMPRESSION: ?1. Coronary calcium score of 595. This was 96th percentile for age, ?sex, and race matched control. ?  ?2. Normal coronary origin with right dominance. ?  ?3. CAD-RADS 4 Severe stenosis. (70-99% or > 50% left main). Cardiac ?catheterization or CT FFR is recommended. Consider symptom-guided ?anti-ischemic pharmacotherapy as well as risk factor modification ?per guideline directed care. ? ?Patch Wear Time:  14 days and 0 hours starting February 10, 2021. ?Indication: ?  ?Patient had a min HR of 53 bpm, max HR of 162 bpm, and avg HR of 77 bpm. Predominant underlying rhythm was Sinus Rhythm.  ?  ?9 Supraventricular Tachycardia runs occurred, the run with the fastest interval lasting 5 beats with a max rate of 162 bpm, the longest lasting 8 beats with an avg rate of 126 bpm. Isolated SVEs were rare (<1.0%), SVE Couplets were rare (<1.0%), and SVE Triplets were rare (<1.0%). Isolated VEs were rare (<1.0%), and no VE Couplets or VE Triplets were present.  ?  ?Symptoms associated with sinus rhythm and sinus tachycardia. ?  ?Conclusion: This study is  remarkable for rare paroxysmal supraventricular tachycardia. ?  ?  ?Transthoracic echocardiogram March 02, 2021 ?IMPRESSIONS  ? ? ? 1. Left ventricular ejection fraction, by estimation, is 60 to 65%. The  ?left ven

## 2021-07-06 NOTE — Telephone Encounter (Signed)
Called the pt back to determine what dates would work for her. After speaking with her called the cath lab. Set her cath for March 31st with Dr. Ali Lowe. ?Called pt, no answer, left message for her to return call. She needs to come in and have CBC drawn before cath can be done. Information sent in a MyChart message was well.  ? ?

## 2021-07-06 NOTE — H&P (View-Only) (Signed)
? ?Virtual Visit via Video Note  ? ?This visit type was conducted due to national recommendations for restrictions regarding the COVID-19 Pandemic (e.g. social distancing) in an effort to limit this patient's exposure and mitigate transmission in our community.  Due to her co-morbid illnesses, this patient is at least at moderate risk for complications without adequate follow up.  This format is felt to be most appropriate for this patient at this time.  All issues noted in this document were discussed and addressed.  A limited physical exam was performed with this format.  Please refer to the patient's chart for her consent to telehealth for [facility]. ? ?   ? ?Date:  07/06/2021  ? ?ID:  Erika [name], DOB November 19, 1955, MRN [id number] ? ?Patient Location: Home ?Provider Location: Office/Clinic ? ?PCP:  Isaac Bliss, Rayford Halsted, MD  ?Cardiologist:  Berniece Salines, DO  ?Electrophysiologist:  None  ? ?Evaluation Performed:  Follow-Up Visit ? ?Chief Complaint:  " I am nervous" ? ?History of Present Illness:   ? ?Erika [name] is a 66 y.o. female with coronary artery disease recently seen on coronary CTA, hypertension, hyperlipidemia paroxysmal SVT here today for follow-up visit.  At her last visit she did tell me she had been experiencing new onset chest pain which she describes and sounded anginal therefore I sent the patient for coronary CTA.  Coronary CTA comes back with severe LAD lesion and FFR being abnormal. ? ?She tells me she still is experiencing intermittent chest discomfort that she reported in the beginning of the month. ? ?The patient does not have symptoms concerning for COVID-19 infection (fever, chills, cough, or new shortness of breath).  ? ? ?Past Medical History:  ?Diagnosis Date  ? Anxiety   ? Arthritis   ? Bell's palsy   ? COPD (chronic obstructive pulmonary disease) ([place])   ? History of cardiac murmur as a child   ? Hyperlipidemia   ? Hypertension   ? Menopausal syndrome   ? Mild asthma   ? no  inhalers since stopped smoking  ? Myocardial infarction [facility])   ? 2020--pt states no damage  ? Neuroma of foot   ? OSA on CPAP   ? Wears glasses   ? ?Past Surgical History:  ?Procedure Laterality Date  ? ELBOW SURGERY Right 2002  ? repair tendon and nerve injury  ? EXCISION MORTON'S NEUROMA Left 2010  ? ROBOTIC ASSISTED TOTAL HYSTERECTOMY WITH BILATERAL SALPINGO OOPHERECTOMY N/A 12/31/2017  ? Procedure: XI ROBOTIC ASSISTED TOTAL HYSTERECTOMY WITH BILATERAL SALPINGO OOPHORECTOMY;  Surgeon: Everitt Amber, MD;  Location: [facility];  Service: Gynecology;  Laterality: N/A;  ? SHOULDER ARTHROSCOPY W/ ROTATOR CUFF REPAIR Left 09/2016  ? and burectomy  ?  ? ?Current Meds  ?Medication Sig  ? acetaminophen (TYLENOL) 650 MG CR tablet Take 650-1,300 mg by mouth every 8 (eight) hours as needed for pain.  ? atorvastatin (LIPITOR) 40 MG tablet Take 1 tablet (40 mg total) by mouth daily.  ? buPROPion (WELLBUTRIN XL) 150 MG 24 hr tablet Take 1 tablet (150 mg total) by mouth daily.  ? cetirizine (ZYRTEC) 10 MG tablet Take 10 mg by mouth daily as needed for allergies.  ? Cholecalciferol (VITAMIN D3 SUPER STRENGTH) 50 MCG (2000 UT) CAPS Take 2,000 Units by mouth.  ? citalopram (CELEXA) 40 MG tablet Take 2 tablets (80 mg total) by mouth at bedtime.  ? cyanocobalamin (,VITAMIN B-12,) 1000 MCG/ML injection INJECT 1 ML (1,000 MCG TOTAL) INTO THE MUSCLE EVERY 30  DAYS.  ? hydrochlorothiazide (HYDRODIURIL) 25 MG tablet Take 1 tablet (25 mg total) by mouth daily.  ? levothyroxine (SYNTHROID) 75 MCG tablet Take 1 tablet (75 mcg total) by mouth daily before breakfast.  ? metoprolol succinate (TOPROL XL) 25 MG 24 hr tablet Take 0.5 tablets (12.5 mg total) by mouth daily.  ? metoprolol tartrate (LOPRESSOR) 100 MG tablet Take 2 hours prior to CT  ? nitroGLYCERIN (NITROSTAT) 0.4 MG SL tablet Place 1 tablet (0.4 mg total) under the tongue every 5 (five) minutes as needed for chest pain. Up to 3 times.  ? SYRINGE-NEEDLE, DISP, 3 ML (BD SAFETYGLIDE  SYRINGE/NEEDLE) 25G X 1" 3 ML MISC Use for B12 injections  ?  ? ?Allergies:   Tape, Tetracycline, and Tetracycline hcl  ? ?Social History  ? ?Tobacco Use  ? Smoking status: Former  ?  Packs/day: 1.00  ?  Years: 30.00  ?  Pack years: 30.00  ?  Types: Cigarettes  ?  Quit date: 06/01/2000  ?  Years since quitting: 21.1  ? Smokeless tobacco: Never  ?Vaping Use  ? Vaping Use: Never used  ?Substance Use Topics  ? Alcohol use: No  ? Drug use: Never  ?  ? ?Family Hx: ?The patient's family history includes Asthma in her sister; Cancer in her brother, maternal aunt, maternal uncle, and paternal grandmother; Diabetes in her maternal grandfather, maternal grandmother, and mother; Heart disease in her mother; Uterine cancer in her maternal uncle. There is no history of Colon cancer, Colon polyps, Esophageal cancer, Rectal cancer, or Stomach cancer. ? ?ROS:   ?Please see the history of present illness.    ? ?All other systems reviewed and are negative. ? ? ?Prior CV studies:   ?The following studies were reviewed today: ? ?FFRCT 07/05/2021 ? 1. Left Main: No significant functional stenosis, CT-FFR 0.99. ?  ?2. LAD: Significant functional stenosis, CT-FFR 0.85-> 0.80 mid LAD, ?0.97 -> 0.71 D2. D1 is too small to be modeled. ?3. LCX: No significant functional stenosis, CT-FFR 0.90. ?4. RCA: No significant functional stenosis, CT-FFR 0.86. ?  ?IMPRESSION: ?1. CT FFR analysis shows evidence of significant functional stenosis ?mid LAD and D2. ?  ?[name] MD ?  ?  ?Electronically Signed ?  By: [name] M.D. ?  On: 07/05/2021 21:31 ?  ? ?CCTA 07/05/2021 ?Aorta:  Normal size.  No calcifications. ?  ?Main Pulmonary Artery: Normal size of the pulmonary artery. ?  ?Aortic Valve:  Tri-leaflet.  No calcifications. ?  ?Coronary Arteries:  Normal coronary origin.  Right dominance. ?  ?Coronary Calcium Score: ?  ?Left main: 13 ?  ?Left anterior descending artery: 258 ?  ?Left circumflex artery: 3 ?  ?Right coronary  artery: 321 ?  ?Total: 595 ?  ?Percentile: 96th for age, sex, and race matched control. ?  ?RCA is a large dominant artery that gives rise to PDA and PLA. ?Minimal calcified ostial plaque. Mild calcified plaque mid and ?distal vessel. ?  ?Left main is a large artery that gives rise to LAD and LCX arteries. ?Minimal calcified plaque distal vessel. ?  ?LAD is a large vessel that gives rise to one large D1 Branch. ?Moderate mixed calcified stenosis mid LAD. Severe calcified stenosis ?proximal D2. Severe stenosis proximal D1 (small vessel). ?  ?LCX is a non-dominant artery that gives rise to one large OM1 ?branch. Minimal calcified mid vessel plaque. ?  ?Other findings: ?  ?Normal pulmonary vein drainage into the left atrium. ?  ?Normal  left atrial appendage without a thrombus. ?  ?Extra-cardiac findings: See attached radiology report for ?non-cardiac structures. ?  ?IMPRESSION: ?1. Coronary calcium score of 595. This was 96th percentile for age, ?sex, and race matched control. ?  ?2. Normal coronary origin with right dominance. ?  ?3. CAD-RADS 4 Severe stenosis. (70-99% or > 50% left main). Cardiac ?catheterization or CT FFR is recommended. Consider symptom-guided ?anti-ischemic pharmacotherapy as well as risk factor modification ?per guideline directed care. ? ?Patch Wear Time:  14 days and 0 hours starting February 10, 2021. ?Indication: ?  ?Patient had a min HR of 53 bpm, max HR of 162 bpm, and avg HR of 77 bpm. Predominant underlying rhythm was Sinus Rhythm.  ?  ?9 Supraventricular Tachycardia runs occurred, the run with the fastest interval lasting 5 beats with a max rate of 162 bpm, the longest lasting 8 beats with an avg rate of 126 bpm. Isolated SVEs were rare (<1.0%), SVE Couplets were rare (<1.0%), and SVE Triplets were rare (<1.0%). Isolated VEs were rare (<1.0%), and no VE Couplets or VE Triplets were present.  ?  ?Symptoms associated with sinus rhythm and sinus tachycardia. ?  ?Conclusion: This study is  remarkable for rare paroxysmal supraventricular tachycardia. ?  ?  ?Transthoracic echocardiogram March 02, 2021 ?IMPRESSIONS  ? ? ? 1. Left ventricular ejection fraction, by estimation, is 60 to 65%. The  ?left ven

## 2021-07-11 ENCOUNTER — Telehealth: Payer: Self-pay | Admitting: *Deleted

## 2021-07-11 DIAGNOSIS — Z79899 Other long term (current) drug therapy: Secondary | ICD-10-CM | POA: Diagnosis not present

## 2021-07-11 LAB — CBC WITH DIFFERENTIAL/PLATELET
Basophils Absolute: 0.1 10*3/uL (ref 0.0–0.2)
Basos: 1 %
EOS (ABSOLUTE): 0.2 10*3/uL (ref 0.0–0.4)
Eos: 3 %
Hematocrit: 39.2 % (ref 34.0–46.6)
Hemoglobin: 12.8 g/dL (ref 11.1–15.9)
Immature Grans (Abs): 0 10*3/uL (ref 0.0–0.1)
Immature Granulocytes: 0 %
Lymphocytes Absolute: 2.1 10*3/uL (ref 0.7–3.1)
Lymphs: 27 %
MCH: 26.7 pg (ref 26.6–33.0)
MCHC: 32.7 g/dL (ref 31.5–35.7)
MCV: 82 fL (ref 79–97)
Monocytes Absolute: 0.5 10*3/uL (ref 0.1–0.9)
Monocytes: 7 %
Neutrophils Absolute: 4.8 10*3/uL (ref 1.4–7.0)
Neutrophils: 62 %
Platelets: 360 10*3/uL (ref 150–450)
RBC: 4.8 x10E6/uL (ref 3.77–5.28)
RDW: 14 % (ref 11.7–15.4)
WBC: 7.7 10*3/uL (ref 3.4–10.8)

## 2021-07-11 NOTE — Telephone Encounter (Signed)
[facility]: [facility], 2021 9 AM ?Arrival time and place: [place] Entrance A at: 7 AM ? ? ?No solid food after midnight prior to cath, clear liquids until 5 AM day of procedure. ? ?Medication instructions for procedure: ?-Hold: ? HCTZ-AM of procedure ?-Except hold medications usual morning medications can be taken with sips of water including aspirin 81 mg. ? ?Confirmed patient has responsible adult to drive home post procedure and be with patient first 24 hours after arriving home. ? ?Patient reports no new symptoms concerning for COVID-19/no exposure to COVID-19 in the past 10 days. ? ?Reviewed procedure instructions with patient.  ? ?

## 2021-07-14 ENCOUNTER — Other Ambulatory Visit: Payer: Self-pay

## 2021-07-14 ENCOUNTER — Other Ambulatory Visit (HOSPITAL_COMMUNITY): Payer: Self-pay

## 2021-07-14 ENCOUNTER — Other Ambulatory Visit: Payer: Self-pay | Admitting: Physician Assistant

## 2021-07-14 ENCOUNTER — Encounter (HOSPITAL_COMMUNITY)
Admission: RE | Disposition: A | Discharge status: Home / Self Care | Payer: Self-pay | Source: Home / Self Care | Attending: Internal Medicine

## 2021-07-14 ENCOUNTER — Ambulatory Visit (HOSPITAL_COMMUNITY)
Admission: RE | Admit: 2021-07-14 | Discharge: 2021-07-14 | Disposition: A | Discharge status: Home / Self Care | Payer: Medicare Other | Attending: Internal Medicine | Admitting: Internal Medicine

## 2021-07-14 ENCOUNTER — Telehealth: Payer: Self-pay | Admitting: Physician Assistant

## 2021-07-14 DIAGNOSIS — I25119 Atherosclerotic heart disease of native coronary artery with unspecified angina pectoris: Secondary | ICD-10-CM | POA: Diagnosis not present

## 2021-07-14 DIAGNOSIS — E785 Hyperlipidemia, unspecified: Secondary | ICD-10-CM | POA: Diagnosis not present

## 2021-07-14 DIAGNOSIS — I10 Essential (primary) hypertension: Secondary | ICD-10-CM | POA: Diagnosis not present

## 2021-07-14 DIAGNOSIS — Z955 Presence of coronary angioplasty implant and graft: Secondary | ICD-10-CM | POA: Insufficient documentation

## 2021-07-14 DIAGNOSIS — G510 Bell's palsy: Secondary | ICD-10-CM | POA: Diagnosis not present

## 2021-07-14 DIAGNOSIS — Z79890 Hormone replacement therapy (postmenopausal): Secondary | ICD-10-CM | POA: Insufficient documentation

## 2021-07-14 DIAGNOSIS — I471 Supraventricular tachycardia: Secondary | ICD-10-CM | POA: Insufficient documentation

## 2021-07-14 DIAGNOSIS — I209 Angina pectoris, unspecified: Secondary | ICD-10-CM

## 2021-07-14 DIAGNOSIS — I2510 Atherosclerotic heart disease of native coronary artery without angina pectoris: Secondary | ICD-10-CM

## 2021-07-14 DIAGNOSIS — F419 Anxiety disorder, unspecified: Secondary | ICD-10-CM | POA: Insufficient documentation

## 2021-07-14 DIAGNOSIS — G4733 Obstructive sleep apnea (adult) (pediatric): Secondary | ICD-10-CM | POA: Insufficient documentation

## 2021-07-14 DIAGNOSIS — Z79899 Other long term (current) drug therapy: Secondary | ICD-10-CM | POA: Diagnosis not present

## 2021-07-14 DIAGNOSIS — I252 Old myocardial infarction: Secondary | ICD-10-CM | POA: Insufficient documentation

## 2021-07-14 DIAGNOSIS — E8770 Fluid overload, unspecified: Secondary | ICD-10-CM

## 2021-07-14 DIAGNOSIS — Z87891 Personal history of nicotine dependence: Secondary | ICD-10-CM | POA: Insufficient documentation

## 2021-07-14 DIAGNOSIS — J449 Chronic obstructive pulmonary disease, unspecified: Secondary | ICD-10-CM | POA: Insufficient documentation

## 2021-07-14 DIAGNOSIS — F3342 Major depressive disorder, recurrent, in full remission: Secondary | ICD-10-CM

## 2021-07-14 HISTORY — PX: LEFT HEART CATH AND CORONARY ANGIOGRAPHY: CATH118249

## 2021-07-14 HISTORY — PX: INTRAVASCULAR IMAGING/OCT: CATH118326

## 2021-07-14 HISTORY — PX: INTRAVASCULAR PRESSURE WIRE/FFR STUDY: CATH118243

## 2021-07-14 HISTORY — PX: CORONARY STENT INTERVENTION: CATH118234

## 2021-07-14 LAB — POCT ACTIVATED CLOTTING TIME
Activated Clotting Time: 257 seconds
Activated Clotting Time: 432 seconds

## 2021-07-14 SURGERY — LEFT HEART CATH AND CORONARY ANGIOGRAPHY
Anesthesia: LOCAL

## 2021-07-14 MED ORDER — SODIUM CHLORIDE 0.9 % WEIGHT BASED INFUSION: 1.0000 mL/kg/h | INTRAVENOUS | Status: DC

## 2021-07-14 MED ORDER — LIDOCAINE HCL (PF) 1 % IJ SOLN
INTRAMUSCULAR | Status: DC | PRN
  Administered 2021-07-14: 2 mL

## 2021-07-14 MED ORDER — CLOPIDOGREL BISULFATE 75 MG PO TABS
75.0000 mg | ORAL_TABLET | Freq: Every day | ORAL | 5 refills | Status: DC
  Filled 2021-07-14: qty 30, 30d supply, fill #0

## 2021-07-14 MED ORDER — FENTANYL CITRATE (PF) 100 MCG/2ML IJ SOLN
INTRAMUSCULAR | Status: DC | PRN
  Administered 2021-07-14 (×3): 25 ug via INTRAVENOUS

## 2021-07-14 MED ORDER — ASPIRIN 81 MG PO CHEW: 81.0000 mg | CHEWABLE_TABLET | ORAL | Status: DC

## 2021-07-14 MED ORDER — SODIUM CHLORIDE 0.9% FLUSH: 3.0000 mL | Freq: Two times a day (BID) | INTRAVENOUS | Status: DC

## 2021-07-14 MED ORDER — LABETALOL HCL 5 MG/ML IV SOLN: 10.0000 mg | INTRAVENOUS | Status: AC | PRN

## 2021-07-14 MED ORDER — SODIUM CHLORIDE 0.9 % IV SOLN: 250.0000 mL | INTRAVENOUS | Status: DC | PRN

## 2021-07-14 MED ORDER — IOHEXOL 350 MG/ML SOLN
INTRAVENOUS | Status: DC | PRN
  Administered 2021-07-14: 100 mL

## 2021-07-14 MED ORDER — ONDANSETRON HCL 4 MG/2ML IJ SOLN: 4.0000 mg | Freq: Four times a day (QID) | INTRAMUSCULAR | Status: DC | PRN

## 2021-07-14 MED ORDER — HEPARIN (PORCINE) IN NACL 1000-0.9 UT/500ML-% IV SOLN
INTRAVENOUS | Status: AC
  Filled 2021-07-14: qty 1000

## 2021-07-14 MED ORDER — HEPARIN SODIUM (PORCINE) 1000 UNIT/ML IJ SOLN
INTRAMUSCULAR | Status: AC
  Filled 2021-07-14: qty 10

## 2021-07-14 MED ORDER — MIDAZOLAM HCL 2 MG/2ML IJ SOLN
INTRAMUSCULAR | Status: AC
Start: 2021-07-14 — End: ?
  Filled 2021-07-14: qty 2

## 2021-07-14 MED ORDER — LIDOCAINE HCL (PF) 1 % IJ SOLN
INTRAMUSCULAR | Status: AC
  Filled 2021-07-14: qty 30

## 2021-07-14 MED ORDER — CITALOPRAM HYDROBROMIDE 20 MG PO TABS
20.0000 mg | ORAL_TABLET | Freq: Every day | ORAL | 1 refills | Status: DC
  Filled 2021-07-14: qty 30, 30d supply, fill #0

## 2021-07-14 MED ORDER — FUROSEMIDE 20 MG PO TABS
20.0000 mg | ORAL_TABLET | Freq: Every day | ORAL | 1 refills | Status: DC
  Filled 2021-07-14: qty 30, 30d supply, fill #0

## 2021-07-14 MED ORDER — ASPIRIN EC 81 MG PO TBEC: 81.0000 mg | DELAYED_RELEASE_TABLET | Freq: Every day | ORAL | Status: DC

## 2021-07-14 MED ORDER — FENTANYL CITRATE (PF) 100 MCG/2ML IJ SOLN
INTRAMUSCULAR | Status: AC
  Filled 2021-07-14: qty 2

## 2021-07-14 MED ORDER — HEPARIN SODIUM (PORCINE) 1000 UNIT/ML IJ SOLN
INTRAMUSCULAR | Status: DC | PRN
  Administered 2021-07-14: 6000 [IU] via INTRAVENOUS
  Administered 2021-07-14: 3000 [IU] via INTRAVENOUS
  Administered 2021-07-14: 5000 [IU] via INTRAVENOUS

## 2021-07-14 MED ORDER — SODIUM CHLORIDE 0.9% FLUSH: 3.0000 mL | INTRAVENOUS | Status: DC | PRN

## 2021-07-14 MED ORDER — HEPARIN (PORCINE) IN NACL 1000-0.9 UT/500ML-% IV SOLN
INTRAVENOUS | Status: DC | PRN
  Administered 2021-07-14 (×2): 500 mL

## 2021-07-14 MED ORDER — ACETAMINOPHEN 325 MG PO TABS: 650.0000 mg | ORAL_TABLET | ORAL | Status: DC | PRN

## 2021-07-14 MED ORDER — CLOPIDOGREL BISULFATE 75 MG PO TABS: 75.0000 mg | ORAL_TABLET | Freq: Every day | ORAL | Status: DC

## 2021-07-14 MED ORDER — CLOPIDOGREL BISULFATE 300 MG PO TABS
ORAL_TABLET | ORAL | Status: DC | PRN
Start: 2021-07-14 — End: 2021-07-14
  Administered 2021-07-14: 600 mg via ORAL

## 2021-07-14 MED ORDER — CLOPIDOGREL BISULFATE 300 MG PO TABS
ORAL_TABLET | ORAL | Status: AC
  Filled 2021-07-14: qty 2

## 2021-07-14 MED ORDER — SODIUM CHLORIDE 0.9 % WEIGHT BASED INFUSION
3.0000 mL/kg/h | INTRAVENOUS | Status: AC
  Administered 2021-07-14: 3 mL/kg/h via INTRAVENOUS

## 2021-07-14 MED ORDER — VERAPAMIL HCL 2.5 MG/ML IV SOLN
INTRAVENOUS | Status: AC
  Filled 2021-07-14: qty 2

## 2021-07-14 MED ORDER — MIDAZOLAM HCL 2 MG/2ML IJ SOLN
INTRAMUSCULAR | Status: DC | PRN
  Administered 2021-07-14 (×3): 1 mg via INTRAVENOUS

## 2021-07-14 MED ORDER — VERAPAMIL HCL 2.5 MG/ML IV SOLN
INTRAVENOUS | Status: DC | PRN
  Administered 2021-07-14: 5 mL via INTRA_ARTERIAL

## 2021-07-14 MED ORDER — HYDRALAZINE HCL 20 MG/ML IJ SOLN: 10.0000 mg | INTRAMUSCULAR | Status: AC | PRN

## 2021-07-14 SURGICAL SUPPLY — 20 items
BALLN SAPPHIRE ~~LOC~~ 2.5X15 (BALLOONS) ×1 IMPLANT
CATH DIAG 6FR JR4 (CATHETERS) ×1 IMPLANT
CATH DIAG 6FR PIGTAIL ANGLED (CATHETERS) ×1 IMPLANT
CATH DRAGONFLY OPTIS 2.7FR (CATHETERS) ×1 IMPLANT
CATH INFINITI 6F FL3.5 (CATHETERS) ×1 IMPLANT
CATH LAUNCHER 6FR AL1 (CATHETERS) IMPLANT
CATHETER LAUNCHER 6FR AL1 (CATHETERS) ×2
DEVICE RAD COMP TR BAND LRG (VASCULAR PRODUCTS) ×1 IMPLANT
GLIDESHEATH SLEND SS 6F .021 (SHEATH) ×1 IMPLANT
GUIDEWIRE INQWIRE 1.5J.035X260 (WIRE) IMPLANT
GUIDEWIRE PRESSURE X 175 (WIRE) ×2 IMPLANT
INQWIRE 1.5J .035X260CM (WIRE) ×2
KIT ENCORE 26 ADVANTAGE (KITS) ×1 IMPLANT
KIT ESSENTIALS PG (KITS) ×1 IMPLANT
KIT HEART LEFT (KITS) ×2 IMPLANT
PACK CARDIAC CATHETERIZATION (CUSTOM PROCEDURE TRAY) ×2 IMPLANT
STENT ONYX FRONTIER 2.25X18 (Permanent Stent) ×1 IMPLANT
TRANSDUCER W/STOPCOCK (MISCELLANEOUS) ×2 IMPLANT
TUBING CIL FLEX 10 FLL-RA (TUBING) ×2 IMPLANT
WIRE EMERALD 3MM-J .035X150CM (WIRE) ×1 IMPLANT

## 2021-07-14 NOTE — Progress Notes (Signed)
CARDIAC REHAB PHASE I  ? ?Stent education completed with pt. Pt educated on importance of ASA and Plavix. Pt given stent card along with heart healthy diet. Reviewed site care, restrictions, and exercise guidelines. Will refer to CRP II [place]. ? ?[id number] ?Rufina Falco, RN BSN ?07/14/2021 ?11:57 AM ? ?

## 2021-07-14 NOTE — Interval H&P Note (Signed)
History and Physical Interval Note: ? ?07/14/2021 ?7:36 AM ? ?Erika [name]  has presented today for surgery, with the diagnosis of CAD, abnormal FFR.  The various methods of treatment have been discussed with the patient and family. After consideration of risks, benefits and other options for treatment, the patient has consented to  Procedure(s): ?LEFT HEART CATH AND CORONARY ANGIOGRAPHY (N/A) as a surgical intervention.  The patient's history has been reviewed, patient examined, no change in status, stable for surgery.  I have reviewed the patient's chart and labs.  Questions were answered to the patient's satisfaction.   ? ?Cath Lab Visit (complete for each Cath Lab visit) ? ?Clinical Evaluation Leading to the Procedure:  ? ?ACS: No. ? ?Non-ACS:   ? ?Anginal Classification: CCS II ? ?Anti-ischemic medical therapy: Maximal Therapy (2 or more classes of medications) ? ?Non-Invasive Test Results: Intermediate-risk stress test findings: cardiac mortality 1-3%/year ? ?Prior CABG: No previous CABG ? ? ? ? ? ? ? ?Erika [name] ? ? ?

## 2021-07-14 NOTE — Discharge Instructions (Addendum)
Keep wrist elevated at heart level for 24 hours ? ?Radial Site Care ? ? ?This sheet gives you information about how to care for yourself after your procedure. Your health care provider may also give you more specific instructions. If you have problems or questions, contact your health care provider. ?What can I expect after the procedure? ?After the procedure, it is common to have: ?Bruising and tenderness at the catheter insertion area. ?Follow these instructions at home: ?Medicines ?Take over-the-counter and prescription medicines only as told by your health care provider. ?Insertion site care ?Follow instructions from your health care provider about how to take care of your insertion site. Make sure you: ?Wash your hands with soap and water before you change your bandage (dressing). If soap and water are not available, use hand sanitizer. ?remove your dressing as told by your health care provider. In 24 hours ?Check your insertion site every day for signs of infection. Check for: ?Redness, swelling, or pain. ?Fluid or blood. ?Pus or a bad smell. ?Warmth. ?See After Visit Summary for instruction on pools/hot tubs ?You may shower 24-48 hours after the procedure, or as directed by your health care provider. ?Remove the dressing and gently wash the site with plain soap and water. ?Pat the area dry with a clean towel. ?Do not rub the site. That could cause bleeding. ?Do not apply powder or lotion to the site. ?Activity ? ? ?For 24 hours after the procedure, or as directed by your health care provider: ?Do not flex or bend the affected arm. ?Do not push or pull heavy objects with the affected arm. ?Do not drive yourself home from the hospital or clinic. You may not drive for 3 days after your heart catheterization. ?Do not operate machinery or power tools. ?Do not lift anything that is heavier than 5lbs for 1 week. ?Ask your health care provider when it is okay to: ?Return to work or school. ?Resume usual physical  activities or sports. ?Resume sexual activity. ?General instructions ?If the catheter site starts to bleed, raise your arm and put firm pressure on the site. If the bleeding does not stop, get help right away. This is a medical emergency. ?If you went home on the same day as your procedure, a responsible adult should be with you for the first 24 hours after you arrive home. ?Keep all follow-up visits as told by your health care provider. This is important. ?Contact a health care provider if: ?You have a fever. ?You have redness, swelling, or yellow drainage around your insertion site. ?Get help right away if: ?You have unusual pain at the radial site. ?The catheter insertion area swells very fast. ?The insertion area is bleeding, and the bleeding does not stop when you hold steady pressure on the area. ?Your arm or hand becomes pale, cool, tingly, or numb. ?These symptoms may represent a serious problem that is an emergency. Do not wait to see if the symptoms will go away. Get medical help right away. Call your local emergency services (911 in the [place] Do not drive yourself to the hospital. ?Summary ?After the procedure, it is common to have bruising and tenderness at the site. ?Follow instructions from your health care provider about how to take care of your radial site wound. Check the wound every day for signs of infection. ?Do not lift anything that is heavier than 5lb for 1 week. ?This information is not intended to replace advice given to you by your health care provider. Make sure  you discuss any questions you have with your health care provider. ?Document Revised: 05/08/2017 Document Reviewed: 05/08/2017 ?Elsevier Patient Education ? [place] [employer].  ?

## 2021-07-14 NOTE — Progress Notes (Signed)
Patient was given discharge instructions. She verbalized understanding. 

## 2021-07-14 NOTE — Telephone Encounter (Signed)
Patient to be discharged today- attempt TOC call on 04/03 ?

## 2021-07-14 NOTE — Telephone Encounter (Signed)
? ? ?  Attention TOC pool, ? ?This patient will need a TOC phone call after discharge. They are being discharged today as a same day PCI dc. ?Follow-up appointment has already been arranged with: Wednesday Aug 02, 2021 with Dr. Harriet Masson. This appt falls outside the 2 week window for [facility] charge but since she is discharged as a same day PCI, would benefit from call to check on her post DC. ? ?They are a patient of Kardie Tobb, DO. ? ?Thank you! ?Charlie Pitter, PA-C ? ?

## 2021-07-14 NOTE — Discharge Summary (Addendum)
?Discharge Summary for Same Day PCI  ? ?Patient ID: Erika [name] ?MRN: [id number]; DOB: February 07, 1956 ? ?Admit date: 07/14/2021 ?Discharge date: 07/14/2021 ? ?Primary Care Provider: Isaac Bliss, Rayford Halsted, MD  ?Primary Cardiologist: Berniece Salines, DO  ?Primary Electrophysiologist:  None  ? ?Discharge Diagnoses  ?  ?Principal Problem: ?  Angina pectoris (Dent) ?Active Problems: ?  Hyperlipidemia ?  Hypertension ?  CAD (coronary artery disease) ?  Hypervolemia ? ? ?Diagnostic Studies/Procedures  ?  ?Cardiac Catheterization 07/14/2021: ? ?  Prox RCA lesion is 20% stenosed. ?  2nd Diag lesion is 40% stenosed. ?  Mid LAD lesion is 50% stenosed. ?  A stent was successfully placed. ?  Post intervention, there is a 0% residual stenosis. ?  LV end diastolic pressure is moderately elevated. ?  The left ventricular ejection fraction is 55-65% by visual estimate. ?  ?1.  RFR positive mid LAD lesion treated with OCT guided and optimized PCI with 1 drug-eluting stent. ?2.  RFR negative second diagonal lesion; intervention was deferred. ?3.  Mild disease elsewhere. ?4.  Preserved ejection fraction with LVEDP of 22 mmHg; the patient will be discharged with Lasix. ?  ?Recommendation: Dual antiplatelet therapy for at least 6 months and consider conversion to Plavix monotherapy thereafter. ?  ?_____________ ?  ?History of Present Illness   ?  ?Erika [name] is a 66 y.o. female with anxiety, arthritis, Bell's palsy, COPD, HTN, HLD, asthma, paroxysmal SVT, OSA who presented to the hospital for planned cardiac catheterization.  She was recently seen in clinic by Dr. Harriet Masson for chest discomfort and had coronary CTA performed which was abnormal, suggestive of occlusive CAD. Cardiac catheterization was arranged for further evaluation. Of note, prior echo 02/2021 showed normal LVEF. ? ?Hospital Course  ?   ?The patient underwent cardiac cath as noted above with resultant DES to the mid LAD. Residual disease will be treated medically. Her LVEDP  was 47mHg. Plan for DAPT with ASA/Plavix for at least 6 months and consider conversion to Plavix monotherapy thereafter. The patient was seen by cardiac rehab while in short stay. There were no observed complications post cath. Radial cath site was re-evaluated prior to discharge and found to be stable without any complications. Instructions/precautions regarding cath site care were given prior to discharge. ? ?Erika JABLONwas seen by Dr. TAli Loweand determined stable for discharge home. Follow up with our office has been arranged. Medications are listed below. Dr. TAli Lowerecommended to decrease Celexa dose to '20mg'$  daily (the max recommended dose while on Plavix), add Lasix '20mg'$  daily (confirmed with MD this is in addition to her HCTZ). He recommends BMET next week which we will arrange. We recommended to change her aspirin to daily dosing rather than night so that she does not go an extended time between now and her next dose. Per discussion with Dr. 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 Activity restrictions were outlined on AVS. I did ask her to discuss her Celexa dose change with her usual prescriber so they are aware to follow with this adjustment. ? ?Hycodan, medrol dosepak, and pre-coronary CT scan Lopressor were removed from med list as she is no longer taking these. ? ?____________ ? ?Cath/PCI Registry Performance & Quality Measures: ?Aspirin prescribed? - Yes ?ADP Receptor Inhibitor (Plavix/Clopidogrel, Brilinta/Ticagrelor or Effient/Prasugrel) prescribed (includes medically managed patients)? - Yes ?High Intensity Statin (Lipitor 40-'80mg'$  or Crestor 20-'40mg'$ ) prescribed? - Yes ?  For EF <40%, was ACEI/ARB prescribed? - Not Applicable (EF >/= 07%) ?For EF <40%, Aldosterone Antagonist (Spironolactone or Eplerenone) prescribed? - Not Applicable (EF >/= 37%) ?Cardiac Rehab Phase II ordered (Included Medically managed Patients)? -  Yes ? ?_____________ ? ? ?Discharge Vitals ?Blood pressure (!) 105/48, pulse 66, temperature 98.2 ?F (36.8 ?C), temperature source Oral, resp. rate 15, height '5\' 7"'$  (1.702 m), weight 108.9 kg, SpO2 95 %.  Danley Danker Weights  ? 07/14/21 0709  ?Weight: 108.9 kg  ? ? ?Last Labs & Radiologic Studies  ?  ?N/A ?_____________  ?CARDIAC CATHETERIZATION ? ?Result Date: 07/14/2021 ?  Prox RCA lesion is 20% stenosed.   2nd Diag lesion is 40% stenosed.   Mid LAD lesion is 50% stenosed.   A stent was successfully placed.   Post intervention, there is a 0% residual stenosis.   LV end diastolic pressure is moderately elevated.   The left ventricular ejection fraction is 55-65% by visual estimate. 1.  RFR positive mid LAD lesion treated with OCT guided and optimized PCI with 1 drug-eluting stent. 2.  RFR negative second diagonal lesion; intervention was deferred. 3.  Mild disease elsewhere. 4.  Preserved ejection fraction with LVEDP of 22 mmHg; the patient will be discharged with Lasix. Recommendation: Dual antiplatelet therapy for at least 6 months and consider conversion to Plavix monotherapy thereafter.  ? ?CT CORONARY MORPH W/CTA COR W/SCORE W/CA W/CM &/OR WO/CM ? ?Addendum Date: 07/05/2021   ?ADDENDUM REPORT: 07/05/2021 21:27 CLINICAL DATA:  66 Year old White Female EXAM: Cardiac/Coronary  CTA TECHNIQUE: The patient was scanned on a [employer]. FINDINGS: Scan was triggered in the descending thoracic aorta. Axial non-contrast 3 mm slices were carried out through the heart. The data set was analyzed on a dedicated work station and scored using the [place]. Gantry rotation speed was 250 msecs and collimation was .6 mm. 0.8 mg of sl NTG was given. The 3D data set was reconstructed in 5% intervals of the 67-82 % of the R-R cycle. Diastolic phases were analyzed on a dedicated work station using MPR, MIP and VRT modes. The patient received 95 cc of contrast. Aorta:  Normal size.  No calcifications. Main Pulmonary Artery:  Normal size of the pulmonary artery. Aortic Valve:  Tri-leaflet.  No calcifications. Coronary Arteries:  Normal coronary origin.  Right dominance. Coronary Calcium Score: Left main: 13 Left anterior descending artery: 258 Left circumflex artery: 3 Right coronary artery: 321 Total: 595 Percentile: 96th for age, sex, and race matched control. RCA is a large dominant artery that gives rise to PDA and PLA. Minimal calcified ostial plaque. Mild calcified plaque mid and distal vessel. Left main is a large artery that gives rise to LAD and LCX arteries. Minimal calcified plaque distal vessel. LAD is a large vessel that gives rise to one large D1 Branch. Moderate mixed calcified stenosis mid LAD. Severe calcified stenosis proximal D2. Severe stenosis proximal D1 (small vessel). LCX is a non-dominant artery that gives rise to one large OM1 branch. Minimal calcified mid vessel plaque. Other findings: Normal pulmonary vein drainage into the left atrium. Normal left atrial appendage without a thrombus. Extra-cardiac findings: See attached radiology report for non-cardiac structures. IMPRESSION: 1. Coronary calcium score of 595. This was 96th percentile for age, sex, and race matched control. 2. Normal coronary origin with right dominance. 3. CAD-RADS 4 Severe stenosis. (70-99% or > 50% left main). Cardiac catheterization or CT FFR is recommended. Consider symptom-guided anti-ischemic pharmacotherapy as well as risk  factor modification per guideline directed care. RECOMMENDATIONS: Coronary artery calcium (CAC) score is a strong predictor of incident coronary heart disease (CHD) and provides predictive information beyond traditional risk factors. CAC scoring is reasonable to use in the decision to withhold, postpone, or initiate statin therapy in intermediate-risk or selected borderline-risk asymptomatic adults (age 64-75 years and LDL-C >=70 to <190 mg/dL) who do not have diabetes or established atherosclerotic cardiovascular  disease (ASCVD).* In intermediate-risk (10-year ASCVD risk >=7.5% to <20%) adults or selected borderline-risk (10-year ASCVD risk >=5% to <7.5%) adults in whom a CAC score is measured for the purpose of mak

## 2021-07-17 ENCOUNTER — Encounter (HOSPITAL_COMMUNITY): Payer: Self-pay | Admitting: Internal Medicine

## 2021-07-19 ENCOUNTER — Telehealth: Payer: Self-pay | Admitting: Internal Medicine

## 2021-07-19 ENCOUNTER — Other Ambulatory Visit: Payer: Self-pay

## 2021-07-19 ENCOUNTER — Telehealth (HOSPITAL_COMMUNITY): Payer: Self-pay

## 2021-07-19 DIAGNOSIS — F3342 Major depressive disorder, recurrent, in full remission: Secondary | ICD-10-CM

## 2021-07-19 DIAGNOSIS — E8770 Fluid overload, unspecified: Secondary | ICD-10-CM

## 2021-07-19 LAB — BASIC METABOLIC PANEL
BUN/Creatinine Ratio: 17 (ref 12–28)
BUN: 15 mg/dL (ref 8–27)
CO2: 29 mmol/L (ref 20–29)
Calcium: 10.2 mg/dL (ref 8.7–10.3)
Chloride: 98 mmol/L (ref 96–106)
Creatinine, Ser: 0.9 mg/dL (ref 0.57–1.00)
Glucose: 100 mg/dL — ABNORMAL HIGH (ref 70–99)
Potassium: 4.1 mmol/L (ref 3.5–5.2)
Sodium: 139 mmol/L (ref 134–144)
eGFR: 71 mL/min/{1.73_m2} (ref >59)

## 2021-07-19 MED ORDER — CITALOPRAM HYDROBROMIDE 20 MG PO TABS: 20.0000 mg | ORAL_TABLET | Freq: Every day | ORAL | 1 refills | Status: DC

## 2021-07-19 NOTE — Telephone Encounter (Signed)
Called patient to see if she is interested in the Cardiac Rehab Program. Patient expressed interest. Explained scheduling process and went over insurance, patient verbalized understanding. Will contact patient for scheduling once f/u has been completed. 

## 2021-07-19 NOTE — Telephone Encounter (Signed)
Pt insurance is active and benefits verified through Medicare a/b Co-pay 0, DED $226/$209.71 met, out of pocket 0/0 met, co-insurance 20%. no pre-authorization required. Passport, 07/19/2021_0 :34am, REF# [id number] ? ?2ndary insurance is active and benefits verified through [employer]. Co-pay 0, DED 0/0 met, out of pocket 0/0 met, co-insurance 0%. No pre-authorization required. ? ?Will contact patient to see if she is interested in the Cardiac Rehab Program. If interested, patient will need to complete follow up appt. Once completed, patient will be contacted for scheduling upon review by the RN Navigator. ?

## 2021-07-19 NOTE — Telephone Encounter (Signed)
Rx sent to the pharmacy.

## 2021-07-19 NOTE — Telephone Encounter (Signed)
Pt call and stated she need a refill on Rx #: [id number]  ?citalopram (CELEXA) 20 MG tablet sent to  ?[id number] - [place], [place] Phone:  [phone]  ?Fax:  [phone]  ?  ? ?

## 2021-07-20 NOTE — Telephone Encounter (Signed)
Patient contacted regarding discharge from [facility] on 07/14/21. ? ?Patient understands to follow up with provider dr Harriet Masson on 08/02/21 at 10 am at [facility]. ?Patient understands discharge instructions? yes ?Patient understands medications and regiment? yes ?Patient understands to bring all medications to this visit? yes ? ?Patient does report cramping in her legs last night. Aware labs from yesterday show normal potassium and kidney function.  ? ? ? ? ? ? ?

## 2021-07-21 ENCOUNTER — Telehealth (HOSPITAL_COMMUNITY): Payer: Self-pay

## 2021-07-21 ENCOUNTER — Other Ambulatory Visit (HOSPITAL_COMMUNITY): Payer: Self-pay

## 2021-07-21 NOTE — Telephone Encounter (Signed)
Pharmacy Transitions of Care Follow-up Telephone Call ? ?Date of discharge: 07/14/21  ?Discharge Diagnosis: stent placement ? ?How have you been since you were released from the hospital? Patient doing well since discharge, no questions about meds at this time.  ? ?Medication changes made at discharge: ?START taking: ?clopidogrel (Plavix)  ?furosemide (Lasix)  ?Icon medications to change how you take   CHANGE how you take: ?aspirin EC  ?Icon medications to stop taking   STOP taking: ?citalopram 40 MG tablet (CELEXA)  ?HYDROcodone bit-homatropine 5-1.5 MG/5ML syrup (HYCODAN)  ?methylPREDNISolone 4 MG Tbpk tablet (MEDROL DOSEPAK)  ?metoprolol tartrate 100 MG tablet (LOPRESSOR)  ? ?Medication changes verified by the patient? Yes ?  ? ?Medication Accessibility: ? ?Home Pharmacy: [facility] ? ?Was the patient provided with refills on discharged medications? Yes  ? ?Have all prescriptions been transferred from [facility] to home pharmacy? Yes  ? ?Is the patient able to afford medications? Has insurance ?  ? ?Medication Review: ? ?Patient confirmed received counseling on Plavix before discharge ? ?CLOPIDOGREL (PLAVIX) ?Clopidogrel 75 mg once daily.  ?- Advised patient of medications to avoid (NSAIDs, ASA)  ?- Educated that Tylenol (acetaminophen) will be the preferred analgesic to prevent risk of bleeding  ?- Emphasized importance of monitoring for signs and symptoms of bleeding (abnormal bruising, prolonged bleeding, nose bleeds, bleeding from gums, discolored urine, black tarry stools)  ?- Advised patient to alert all providers of anticoagulation therapy prior to starting a new medication or having a procedure  ? ? ?Follow-up Appointments: ? ?[facility] f/u appt confirmed? Scheduled to see Dr. Harriet Masson on 08/02/21 @ 10AM.  ? ?If their condition worsens, is the pt aware to call PCP or go to the Emergency Dept.? Yes ? ?Final Patient Assessment: ?Patient has refills at home pharmacy and has f/u scheduled ? ?

## 2021-08-02 ENCOUNTER — Encounter: Payer: Self-pay | Admitting: Cardiology

## 2021-08-02 ENCOUNTER — Ambulatory Visit (INDEPENDENT_AMBULATORY_CARE_PROVIDER_SITE_OTHER): Payer: Medicare Other | Admitting: Cardiology

## 2021-08-02 VITALS — BP 134/72 | HR 66 | Ht 67.0 in | Wt 241.4 lb

## 2021-08-02 DIAGNOSIS — E782 Mixed hyperlipidemia: Secondary | ICD-10-CM

## 2021-08-02 DIAGNOSIS — G4733 Obstructive sleep apnea (adult) (pediatric): Secondary | ICD-10-CM | POA: Diagnosis not present

## 2021-08-02 DIAGNOSIS — I119 Hypertensive heart disease without heart failure: Secondary | ICD-10-CM | POA: Insufficient documentation

## 2021-08-02 DIAGNOSIS — I2510 Atherosclerotic heart disease of native coronary artery without angina pectoris: Secondary | ICD-10-CM | POA: Insufficient documentation

## 2021-08-02 DIAGNOSIS — Z9989 Dependence on other enabling machines and devices: Secondary | ICD-10-CM

## 2021-08-02 NOTE — Patient Instructions (Signed)
Medication Instructions:  ?Your physician recommends that you continue on your current medications as directed. Please refer to the Current Medication list given to you today.  ?*If you need a refill on your cardiac medications before your next appointment, please call your pharmacy* ? ? ?Lab Work: ?NONE ?If you have labs (blood work) drawn today and your tests are completely normal, you will receive your results only by: ?MyChart Message (if you have MyChart) OR ?A paper copy in the mail ?If you have any lab test that is abnormal or we need to change your treatment, we will call you to review the results. ? ? ?Testing/Procedures: ?NONE ? ? ?Follow-Up: ?At [facility], you and your health needs are our priority.  As part of our continuing mission to provide you with exceptional heart care, we have created designated Provider Care Teams.  These Care Teams include your primary Cardiologist (physician) and Advanced Practice Providers (APPs -  Physician Assistants and Nurse Practitioners) who all work together to provide you with the care you need, when you need it. ? ?We recommend signing up for the patient portal called "MyChart".  Sign up information is provided on this After Visit Summary.  MyChart is used to connect with patients for Virtual Visits (Telemedicine).  Patients are able to view lab/test results, encounter notes, upcoming appointments, etc.  Non-urgent messages can be sent to your provider as well.   ?To learn more about what you can do with MyChart, go to [email or web address].   ? ?Your next appointment:   ?6 month(s) ? ?The format for your next appointment:   ?In Person ? ?Provider:   ?Berniece Salines, DO   ? ?Important Information About Sugar ? ? ? ? ?  ?

## 2021-08-02 NOTE — Progress Notes (Signed)
?Cardiology Office Note:   ? ?Date:  08/02/2021  ? ?ID:  Erika [name], DOB 16-Nov-1955, MRN [id number] ? ?PCP:  Isaac Bliss, Rayford Halsted, MD  ?Cardiologist:  Berniece Salines, DO  ?Electrophysiologist:  None  ? ?Referring MD: Isaac Bliss, [place]*  ? ?" I am doing much better" ? ?History of Present Illness:   ? ?Erika [name] is a 66 y.o. female with a hx of coronary artery disease status post stent to the LAD, hypertension, hyperlipidemia, paroxysmal SVT, OSA on CPAP here today for follow-up visit. ? ?I saw the patient via virtual visit on July 06, 2021 at that time she had CT results that was concerning for significant stenosis.  She referred to heart catheterization now with drug-eluting stent to her LAD. ? ?Since her procedure she tells me that her shortness of breath has improved significantly.  She has not had any complete resolution as she thinks that her sleep apnea is also playing a role here.  She is concerned that she has had her CPAP for 15 years and this has not been optimized despite her weight gain. ? ?Past Medical History:  ?Diagnosis Date  ? Anxiety   ? Arthritis   ? Bell's palsy   ? COPD (chronic obstructive pulmonary disease) ([place])   ? History of cardiac murmur as a child   ? Hyperlipidemia   ? Hypertension   ? Menopausal syndrome   ? Mild asthma   ? no inhalers since stopped smoking  ? Myocardial infarction [facility])   ? 2020--pt states no damage  ? Neuroma of foot   ? OSA on CPAP   ? Wears glasses   ? ? ?Past Surgical History:  ?Procedure Laterality Date  ? CORONARY STENT INTERVENTION N/A 07/14/2021  ? Procedure: CORONARY STENT INTERVENTION;  Surgeon: [name], MD;  Location: [place] [facility];  Service: Cardiovascular;  Laterality: N/A;  ? ELBOW SURGERY Right 2002  ? repair tendon and nerve injury  ? EXCISION MORTON'S NEUROMA Left 2010  ? INTRAVASCULAR IMAGING/OCT N/A 07/14/2021  ? Procedure: INTRAVASCULAR IMAGING/OCT;  Surgeon: [name], MD;  Location: [place] [facility];  Service:  Cardiovascular;  Laterality: N/A;  ? INTRAVASCULAR PRESSURE WIRE/FFR STUDY N/A 07/14/2021  ? Procedure: INTRAVASCULAR PRESSURE WIRE/FFR STUDY;  Surgeon: [name], MD;  Location: [place] [facility];  Service: Cardiovascular;  Laterality: N/A;  ? LEFT HEART CATH AND CORONARY ANGIOGRAPHY N/A 07/14/2021  ? Procedure: LEFT HEART CATH AND CORONARY ANGIOGRAPHY;  Surgeon: [name], MD;  Location: [place] [facility];  Service: Cardiovascular;  Laterality: N/A;  ? ROBOTIC ASSISTED TOTAL HYSTERECTOMY WITH BILATERAL SALPINGO OOPHERECTOMY N/A 12/31/2017  ? Procedure: XI ROBOTIC ASSISTED TOTAL HYSTERECTOMY WITH BILATERAL SALPINGO OOPHORECTOMY;  Surgeon: Everitt Amber, MD;  Location: [facility];  Service: Gynecology;  Laterality: N/A;  ? SHOULDER ARTHROSCOPY W/ ROTATOR CUFF REPAIR Left 09/2016  ? and burectomy  ? ? ?Current Medications: ?Current Meds  ?Medication Sig  ? acetaminophen (TYLENOL) 650 MG CR tablet Take 650-1,300 mg by mouth every 8 (eight) hours as needed for pain.  ? albuterol (VENTOLIN HFA) 108 (90 Base) MCG/ACT inhaler Inhale 2 puffs into the lungs every 4 (four) hours as needed for wheezing or shortness of breath.  ? aspirin EC 81 MG tablet Take 1 tablet (81 mg total) by mouth daily. Swallow whole.  ? atorvastatin (LIPITOR) 40 MG tablet Take 1 tablet (40 mg total) by mouth daily. (Patient taking differently: Take 40 mg by mouth every  evening.)  ? buPROPion (WELLBUTRIN XL) 150 MG 24 hr tablet Take 1 tablet (150 mg total) by mouth daily. (Patient taking differently: Take 150 mg by mouth every evening.)  ? cetirizine (ZYRTEC) 10 MG tablet Take 10 mg by mouth daily as needed for allergies.  ? Cholecalciferol (VITAMIN D3 SUPER STRENGTH) 50 MCG (2000 UT) CAPS Take 2,000 Units by mouth every evening.  ? citalopram (CELEXA) 20 MG tablet Take 1 tablet (20 mg total) by mouth at bedtime.  ? clopidogrel (PLAVIX) 75 MG tablet Take 1 tablet (75 mg total) by mouth daily.  ? cyanocobalamin (,VITAMIN B-12,) 1000 MCG/ML  injection INJECT 1 ML (1,000 MCG TOTAL) INTO THE MUSCLE EVERY 30 DAYS.  ? diphenhydramine-acetaminophen (TYLENOL PM) 25-500 MG TABS tablet Take 1 tablet by mouth at bedtime as needed (sleep).  ? furosemide (LASIX) 20 MG tablet Take 1 tablet (20 mg total) by mouth daily.  ? hydrochlorothiazide (HYDRODIURIL) 25 MG tablet Take 1 tablet (25 mg total) by mouth daily. (Patient taking differently: Take 25 mg by mouth every evening.)  ? levothyroxine (SYNTHROID) 75 MCG tablet Take 1 tablet (75 mcg total) by mouth daily before breakfast.  ? metoprolol succinate (TOPROL XL) 25 MG 24 hr tablet Take 0.5 tablets (12.5 mg total) by mouth daily. (Patient taking differently: Take 12.5 mg by mouth every evening.)  ? nitroGLYCERIN (NITROSTAT) 0.4 MG SL tablet Place 1 tablet (0.4 mg total) under the tongue every 5 (five) minutes as needed for chest pain. Up to 3 times.  ? polyvinyl alcohol (LIQUIFILM TEARS) 1.4 % ophthalmic solution Place 1 drop into both eyes 5 (five) times daily. (Patient taking differently: Place 1 drop into both eyes 5 (five) times daily as needed for dry eyes.)  ? SYRINGE-NEEDLE, DISP, 3 ML (BD SAFETYGLIDE SYRINGE/NEEDLE) 25G X 1" 3 ML MISC Use for B12 injections  ?  ? ?Allergies:   Tape and Tetracycline hcl  ? ?Social History  ? ?Socioeconomic History  ? Marital status: Married  ?  Spouse name: Not on file  ? Number of children: Not on file  ? Years of education: Not on file  ? Highest education level: Not on file  ?Occupational History  ? Occupation: Armed forces technical officer  ?  Employer: [employer]  ?Tobacco Use  ? Smoking status: Former  ?  Packs/day: 1.00  ?  Years: 30.00  ?  Pack years: 30.00  ?  Types: Cigarettes  ?  Quit date: 06/01/2000  ?  Years since quitting: 21.1  ? Smokeless tobacco: Never  ?Vaping Use  ? Vaping Use: Never used  ?Substance and Sexual Activity  ? Alcohol use: No  ? Drug use: Never  ? Sexual activity: Not Currently  ?  Birth control/protection: Post-menopausal  ?Other Topics Concern  ?  Not on file  ?Social History Narrative  ? Not on file  ? ?Social Determinants of Health  ? ?Financial Resource Strain: Not on file  ?Food Insecurity: Not on file  ?Transportation Needs: Not on file  ?Physical Activity: Not on file  ?Stress: Not on file  ?Social Connections: Not on file  ?  ? ?Family History: ?The patient's family history includes Asthma in her sister; Cancer in her brother, maternal aunt, maternal uncle, and paternal grandmother; Diabetes in her maternal grandfather, maternal grandmother, and mother; Heart disease in her mother; Uterine cancer in her maternal uncle. There is no history of Colon cancer, Colon polyps, Esophageal cancer, Rectal cancer, or Stomach cancer. ? ?ROS:   ?Review of  Systems  ?Constitution: Negative for decreased appetite, fever and weight gain.  ?HENT: Negative for congestion, ear discharge, hoarse voice and sore throat.   ?Eyes: Negative for discharge, redness, vision loss in right eye and visual halos.  ?Cardiovascular: Negative for chest pain, dyspnea on exertion, leg swelling, orthopnea and palpitations.  ?Respiratory: Negative for cough, hemoptysis, shortness of breath and snoring.   ?Endocrine: Negative for heat intolerance and polyphagia.  ?Hematologic/Lymphatic: Negative for bleeding problem. Does not bruise/bleed easily.  ?Skin: Negative for flushing, nail changes, rash and suspicious lesions.  ?Musculoskeletal: Negative for arthritis, joint pain, muscle cramps, myalgias, neck pain and stiffness.  ?Gastrointestinal: Negative for abdominal pain, bowel incontinence, diarrhea and excessive appetite.  ?Genitourinary: Negative for decreased libido, genital sores and incomplete emptying.  ?Neurological: Negative for brief paralysis, focal weakness, headaches and loss of balance.  ?Psychiatric/Behavioral: Negative for altered mental status, depression and suicidal ideas.  ?Allergic/Immunologic: Negative for HIV exposure and persistent infections.  ? ? ?EKGs/Labs/Other  Studies Reviewed:   ? ?The following studies were reviewed today: ? ? ?EKG: None today  ? ?LHC 07/14/2021 ?  Prox RCA lesion is 20% stenosed. ?  2nd Diag lesion is 40% stenosed. ?  Mid LAD lesion is 50% stenosed

## 2021-08-14 ENCOUNTER — Other Ambulatory Visit: Payer: Self-pay | Admitting: Internal Medicine

## 2021-08-14 MED ORDER — CLOPIDOGREL BISULFATE 75 MG PO TABS: 75.0000 mg | ORAL_TABLET | Freq: Every day | ORAL | 1 refills | Status: DC

## 2021-08-14 MED ORDER — FUROSEMIDE 20 MG PO TABS: 20.0000 mg | ORAL_TABLET | Freq: Every day | ORAL | 1 refills | Status: DC

## 2021-08-14 NOTE — Telephone Encounter (Signed)
Okay to fill? 

## 2021-08-14 NOTE — Telephone Encounter (Signed)
Pt is calling and these medications came from hospital. Pt would like a refill on clopidogrel (PLAVIX) 75 MG tablet and furosemide (LASIX) 20 MG tablet  ?[id number] - [place], [place] Phone:  [phone]  ?Fax:  [phone]  ?  ? ?

## 2021-08-22 ENCOUNTER — Ambulatory Visit (INDEPENDENT_AMBULATORY_CARE_PROVIDER_SITE_OTHER): Payer: Medicare Other | Admitting: Internal Medicine

## 2021-08-22 ENCOUNTER — Encounter: Payer: Self-pay | Admitting: Internal Medicine

## 2021-08-22 ENCOUNTER — Ambulatory Visit (HOSPITAL_COMMUNITY)
Admission: EM | Admit: 2021-08-22 | Discharge: 2021-08-22 | Disposition: A | Discharge status: Home / Self Care | Payer: Medicare Other | Attending: Maternal & Fetal Medicine | Admitting: Maternal & Fetal Medicine

## 2021-08-22 VITALS — BP 120/80 | HR 87 | Temp 97.7°F | Wt 245.4 lb

## 2021-08-22 DIAGNOSIS — I2510 Atherosclerotic heart disease of native coronary artery without angina pectoris: Secondary | ICD-10-CM

## 2021-08-22 DIAGNOSIS — E538 Deficiency of other specified B group vitamins: Secondary | ICD-10-CM | POA: Diagnosis not present

## 2021-08-22 DIAGNOSIS — F339 Major depressive disorder, recurrent, unspecified: Secondary | ICD-10-CM | POA: Diagnosis not present

## 2021-08-22 DIAGNOSIS — F332 Major depressive disorder, recurrent severe without psychotic features: Secondary | ICD-10-CM | POA: Diagnosis not present

## 2021-08-22 MED ORDER — CYANOCOBALAMIN 1000 MCG/ML IJ SOLN: INTRAMUSCULAR | 2 refills | Status: DC

## 2021-08-22 NOTE — ED Provider Notes (Signed)
Behavioral Health Urgent Care Medical Screening Exam ? ?Patient Name: Erika [name] ?MRN: [id number] ?Date of Evaluation: 08/22/21 ?Chief Complaint:   ?Diagnosis:  ?Final diagnoses:  ?Severe episode of recurrent major depressive disorder, without psychotic features ([place])  ? ?History of Present illness: Erika [name] is a 66 y.o. female. Pt presents voluntarily to [facility] behavioral health for walk-in assessment.  Pt is accompanied by Adonis Brook, her daughter-in-law.  ?Pt is assessed face-to-face by nurse practitioner. Pt gives verbal consent for nurse practitioner to speak w/ daughter-in-law and for daughter-in-law to be present for assessment.  ? ?Pt w/ hx of depression. ? ?Pt reports chronically depressed mood, with accompanying feelings of worthlessness, and "feeling like a burden". Depression has been worsening for the past 3 years, following the death of her mother. Reports chronic passive SI. Reports this past weekend experienced active SI for the first time while driving with her husband. She states she was in the car w/ her husband, when her husband spoke w/ her about purchasing a new vehicle. Pt became upset because she has been "struggling with bills". States she thought "I could just roll out of this door right now". Denies attempting to do this or intent to do this. Reports current passive SI ("want to go to sleep and not wake up"). Denies current active SI. Denies plan or intent. Verbally contracts to safety.  ? ?Reports poor social support from her husband, one of her sons, her daughter, her sister, and her brother. Pt does feel supported by her son Elta Guadeloupe, and her daughter-in-law, Adonis Brook. Adonis Brook states she and Elta Guadeloupe, have an in-law suite in their home and plan to move pt to their home, which pt states she is looking forward to.  ? ?Pt denies VI/HI. ? ?Pt denies AVH, paranoia, delusions. ? ?Pt denies hx of inpatient psychiatric hospitalization. Denies hx of SA. Denies hx of NSSI.  ? ?Pt is  receiving medication management from her PCP. Pt is taking bupropion and celexa. Denies current counseling.  ? ?Denies alcohol, MJA, nicotine, other SU. ? ?When asked about family psychiatric hx, pt reports her mother had a "nervous breakdown", "crying" and "went to the hospital". ? ?Pt reports she is living with her husband. Reports there is a firearm in the home. Has access to firearm when she "remembers the code", which she states is "not often". States she is not interested in this firearm. Adonis Brook denies concerns about pt access to firearm. Pt and Adonis Brook agree that they will talk to pt's husband so that the codes to the firearm lock are changed and pt does not have access.  ? ?Pt agrees to reach out to her family, call 911 or call mobile crisis, or go to nearest emergency room if condition worsens or if suicidal thoughts become active.  ? ?Discussed plan for discharge w/ follow up for medication management and counseling. Pt and Adonis Brook agree w/ plan. ? ?Pt is a&ox3. Appears casually dressed, appropriate for environment. Eye contact is good. Speech is clear and coherent w/ nml rate and volume. Reported mood is depressed. Affect is congruent, tearful. TP is coherent, goal directed, linear. Description of associations intact. TC is logical. There is no evidence of internal preoccupation, agitation, aggression, or distractibility. No paranoia or delusions elicited.  ? ?Psychiatric Specialty Exam ? ?Presentation  ?General Appearance:Appropriate for Environment; Casual ? ?Eye Contact:Good ? ?Speech:Clear and Coherent; Normal Rate ? ?Speech Volume:Normal ? ?Handedness:No data recorded ? ?Mood and Affect  ?Mood:Depressed ?Affect:Congruent; Tearful ? ?Thought Process  ?  Thought Processes:Coherent; Goal Directed; Linear ?Descriptions of Associations:Intact ? ?Orientation:Full (Time, Place and Person) ? ?Thought Content:Logical ?   Hallucinations:None ? ?Ideas of Reference:None ? ?Suicidal Thoughts:Yes,  Passive ?Without Plan; Without Intent ? ?Homicidal Thoughts:No data recorded ? ?Sensorium  ?Memory:Immediate Good ?Judgment:Intact ?Insight:Present ? ?Executive Functions  ?Concentration:Good ?Attention Span:Good ?Recall:Good ?Fund of [place] ?Language:Good ? ?Psychomotor Activity  ?Psychomotor Activity:Normal ? ?Assets  ?Assets:Communication Skills; Desire for Improvement; Financial Resources/Insurance; Housing; Social Support ? ?Sleep  ?Sleep:Poor ?Number of hours: 4 ? ?No data recorded ? ?Physical Exam: ?Physical Exam ?Cardiovascular:  ?   Rate and Rhythm: Normal rate.  ?Pulmonary:  ?   Effort: Pulmonary effort is normal.  ?Neurological:  ?   Mental Status: She is alert and oriented to person, place, and time.  ?Psychiatric:     ?   Attention and Perception: Attention and perception normal.     ?   Mood and Affect: Mood is depressed. Affect is tearful.     ?   Speech: Speech normal.     ?   Behavior: Behavior normal. Behavior is cooperative.     ?   Thought Content: Thought content includes suicidal ideation.     ?   Cognition and Memory: Cognition and memory normal.     ?   Judgment: Judgment normal.  ? ?Review of Systems  ?Constitutional:  Negative for chills and fever.  ?Respiratory:  Negative for shortness of breath.   ?Cardiovascular:  Negative for chest pain and palpitations.  ?Psychiatric/Behavioral:  Positive for depression and suicidal ideas.   ?Blood pressure (!) 167/89, pulse 89, temperature 97.7 ?F (36.5 ?C), temperature source Oral, resp. rate 18, SpO2 100 %. There is no height or weight on file to calculate BMI. ? ?Musculoskeletal: ?Strength & Muscle Tone: within normal limits ?Gait & Station: normal ?Patient leans: N/A ? ?[facility] M[facility] Discharge Disposition for Follow up and Recommendations: ?Based on my evaluation the patient does not appear to have an emergency medical condition and can be discharged with resources and follow up care in outpatient services for Medication Management and  Individual Therapy ? ?Tharon Aquas, NP ?08/22/2021, 4:31 PM ?

## 2021-08-22 NOTE — ED Triage Notes (Signed)
Pt was recommended for a mental health evaluation by her PCP. Pt states the other day she was having SI while riding in the car with her husband and thought " I could just roll out of this door right now". Pt denies any active plan. Pt states she lost her mother 3 years ago, and her depression symptoms increased after. Pt states I just want to stop hurting. Pt states she is diagnosed with depression and is compliant with medication. Pt states her medication was prescribed by her PCP.  Pt denies SI/HI and AVH at this time. ?

## 2021-08-22 NOTE — Progress Notes (Signed)
? ? ? ?Established Patient Office Visit ? ? ? ? ?CC/Reason for Visit: Discuss depression ? ?HPI: Erika [name] is a 66 y.o. female who is coming in today for the above mentioned reasons. Past Medical History is significant for: Hypertension, hyperlipidemia, vitamin B12 deficiency, depression, OSA on CPAP, hypothyroidism, asthma.  Since I last saw her she was diagnosed with coronary artery disease and had an LAD stent in March.  She is here today to discuss her depression.  She is visibly distraught and tearful.  She states "my depression is bad".  "I cannot cope with my problems".  "Everything is extremely overwhelming ".  She has no desire to leave the house or see anybody.  She terribly misses her mother who is deceased.  "She was my confidant, no I have nobody to speak to".  She is having lots of stressors mainly marital issues.  "Everything and everyone around me would be better if I was no longer living".  She has thought about jumping out of a moving car before.  No active suicidal plan, but she tells me that she entertains suicide thoughts frequently. ? ? ?Past Medical/Surgical History: ?Past Medical History:  ?Diagnosis Date  ? Anxiety   ? Arthritis   ? Bell's palsy   ? COPD (chronic obstructive pulmonary disease) ([place])   ? History of cardiac murmur as a child   ? Hyperlipidemia   ? Hypertension   ? Menopausal syndrome   ? Mild asthma   ? no inhalers since stopped smoking  ? Myocardial infarction [facility])   ? 2020--pt states no damage  ? Neuroma of foot   ? OSA on CPAP   ? Wears glasses   ? ? ?Past Surgical History:  ?Procedure Laterality Date  ? CORONARY STENT INTERVENTION N/A 07/14/2021  ? Procedure: CORONARY STENT INTERVENTION;  Surgeon: [name], MD;  Location: [place] [facility];  Service: Cardiovascular;  Laterality: N/A;  ? ELBOW SURGERY Right 2002  ? repair tendon and nerve injury  ? EXCISION MORTON'S NEUROMA Left 2010  ? INTRAVASCULAR IMAGING/OCT N/A 07/14/2021  ? Procedure: INTRAVASCULAR  IMAGING/OCT;  Surgeon: [name], MD;  Location: [place] [facility];  Service: Cardiovascular;  Laterality: N/A;  ? INTRAVASCULAR PRESSURE WIRE/FFR STUDY N/A 07/14/2021  ? Procedure: INTRAVASCULAR PRESSURE WIRE/FFR STUDY;  Surgeon: [name], MD;  Location: [place] [facility];  Service: Cardiovascular;  Laterality: N/A;  ? LEFT HEART CATH AND CORONARY ANGIOGRAPHY N/A 07/14/2021  ? Procedure: LEFT HEART CATH AND CORONARY ANGIOGRAPHY;  Surgeon: [name], MD;  Location: [place] [facility];  Service: Cardiovascular;  Laterality: N/A;  ? ROBOTIC ASSISTED TOTAL HYSTERECTOMY WITH BILATERAL SALPINGO OOPHERECTOMY N/A 12/31/2017  ? Procedure: XI ROBOTIC ASSISTED TOTAL HYSTERECTOMY WITH BILATERAL SALPINGO OOPHORECTOMY;  Surgeon: Everitt Amber, MD;  [facility];  Service: Gynecology;  Laterality: N/A;  ? SHOULDER ARTHROSCOPY W/ ROTATOR CUFF REPAIR Left 09/2016  ? and burectomy  ? ? ?Social History: ? reports that she quit smoking about 21 years ago. Her smoking use included cigarettes. She has a 30.00 pack-year smoking history. She has never used smokeless tobacco. She reports that she does not drink alcohol and does not use drugs. ? ?Allergies: ?Allergies  ?Allergen Reactions  ? Tape Other (See Comments)  ?  PATIENT PREFERS [place] DON'T AGREE WITH HER SKIN  ? Tetracycline Hcl Rash  ? ? ?Family History:  ?Family History  ?Problem Relation Age of Onset  ? Diabetes Mother   ?  Heart disease Mother   ? Asthma Sister   ? Cancer Brother   ? Cancer Maternal Aunt   ? Cancer Maternal Uncle   ? Uterine cancer Maternal Uncle   ? Diabetes Maternal Grandmother   ? Diabetes Maternal Grandfather   ? Cancer Paternal Grandmother   ? Colon cancer Neg Hx   ? Colon polyps Neg Hx   ? Esophageal cancer Neg Hx   ? Rectal cancer Neg Hx   ? Stomach cancer Neg Hx   ? ? ? ?Current Outpatient Medications:  ?  acetaminophen (TYLENOL) 650 MG CR tablet, Take 650-1,300 mg by mouth every 8 (eight) hours as needed for  pain., Disp: , Rfl:  ?  albuterol (VENTOLIN HFA) 108 (90 Base) MCG/ACT inhaler, Inhale 2 puffs into the lungs every 4 (four) hours as needed for wheezing or shortness of breath., Disp: 18 g, Rfl: 0 ?  aspirin EC 81 MG tablet, Take 1 tablet (81 mg total) by mouth daily. Swallow whole., Disp: , Rfl:  ?  atorvastatin (LIPITOR) 40 MG tablet, Take 1 tablet (40 mg total) by mouth daily. (Patient taking differently: Take 40 mg by mouth every evening.), Disp: 90 tablet, Rfl: 1 ?  buPROPion (WELLBUTRIN XL) 150 MG 24 hr tablet, Take 1 tablet (150 mg total) by mouth daily. (Patient taking differently: Take 150 mg by mouth every evening.), Disp: 90 tablet, Rfl: 1 ?  cetirizine (ZYRTEC) 10 MG tablet, Take 10 mg by mouth daily as needed for allergies., Disp: , Rfl:  ?  Cholecalciferol (VITAMIN D3 SUPER STRENGTH) 50 MCG (2000 UT) CAPS, Take 2,000 Units by mouth every evening., Disp: , Rfl:  ?  citalopram (CELEXA) 20 MG tablet, Take 1 tablet (20 mg total) by mouth at bedtime., Disp: 30 tablet, Rfl: 1 ?  clopidogrel (PLAVIX) 75 MG tablet, Take 1 tablet (75 mg total) by mouth daily., Disp: 90 tablet, Rfl: 1 ?  diphenhydramine-acetaminophen (TYLENOL PM) 25-500 MG TABS tablet, Take 1 tablet by mouth at bedtime as needed (sleep)., Disp: , Rfl:  ?  furosemide (LASIX) 20 MG tablet, Take 1 tablet (20 mg total) by mouth daily., Disp: 90 tablet, Rfl: 1 ?  hydrochlorothiazide (HYDRODIURIL) 25 MG tablet, Take 1 tablet (25 mg total) by mouth daily. (Patient taking differently: Take 25 mg by mouth every evening.), Disp: 90 tablet, Rfl: 1 ?  levothyroxine (SYNTHROID) 75 MCG tablet, Take 1 tablet (75 mcg total) by mouth daily before breakfast., Disp: 90 tablet, Rfl: 1 ?  metoprolol succinate (TOPROL XL) 25 MG 24 hr tablet, Take 0.5 tablets (12.5 mg total) by mouth daily. (Patient taking differently: Take 12.5 mg by mouth every evening.), Disp: 45 tablet, Rfl: 3 ?  nitroGLYCERIN (NITROSTAT) 0.4 MG SL tablet, Place 1 tablet (0.4 mg total) under the  tongue every 5 (five) minutes as needed for chest pain. Up to 3 times., Disp: 90 tablet, Rfl: 3 ?  polyvinyl alcohol (LIQUIFILM TEARS) 1.4 % ophthalmic solution, Place 1 drop into both eyes 5 (five) times daily. (Patient taking differently: Place 1 drop into both eyes 5 (five) times daily as needed for dry eyes.), Disp: 15 mL, Rfl: 0 ?  SYRINGE-NEEDLE, DISP, 3 ML (BD SAFETYGLIDE SYRINGE/NEEDLE) 25G X 1" 3 ML MISC, Use for B12 injections, Disp: 100 each, Rfl: 11 ?  cyanocobalamin (,VITAMIN B-12,) 1000 MCG/ML injection, INJECT 1 ML (1,000 MCG TOTAL) INTO THE MUSCLE EVERY 30 DAYS., Disp: 6 mL, Rfl: 2 ? ?Review of Systems:  ?Constitutional: Denies fever, chills, diaphoresis, appetite change  and fatigue.  ?HEENT: Denies photophobia, eye pain, redness, hearing loss, ear pain, congestion, sore throat, rhinorrhea, sneezing, mouth sores, trouble swallowing, neck pain, neck stiffness and tinnitus.   ?Respiratory: Denies SOB, DOE, cough, chest tightness,  and wheezing.   ?Cardiovascular: Denies chest pain, palpitations and leg swelling.  ?Gastrointestinal: Denies nausea, vomiting, abdominal pain, diarrhea, constipation, blood in stool and abdominal distention.  ?Genitourinary: Denies dysuria, urgency, frequency, hematuria, flank pain and difficulty urinating.  ?Endocrine: Denies: hot or cold intolerance, sweats, changes in hair or nails, polyuria, polydipsia. ?Musculoskeletal: Denies myalgias, back pain, joint swelling, arthralgias and gait problem.  ?Skin: Denies pallor, rash and wound.  ?Neurological: Denies dizziness, seizures, syncope, weakness, light-headedness, numbness and headaches.  ?Hematological: Denies adenopathy. Easy bruising, personal or family bleeding history  ?Psychiatric/Behavioral: Positive for suicidal ideation, mood changes, confusion, nervousness, sleep disturbance and agitation ? ? ? ?Physical Exam: ?Vitals:  ? 08/22/21 1411 08/22/21 1412  ?BP: (!) 148/88 120/80  ?Pulse: 87   ?Temp: 97.7 ?F (36.5 ?C)    ?TempSrc: Oral   ?SpO2: 93%   ?Weight: 245 lb 6.4 oz (111.3 kg)   ? ? ?Body mass index is 38.44 kg/m?. ? ? ? ?Constitutional: NAD, calm, comfortable ?Eyes: PERRL, lids and conjunctivae normal, wears corrective

## 2021-08-22 NOTE — Discharge Instructions (Addendum)
Patient is instructed prior to discharge to: Take all medications as prescribed by his/her mental healthcare provider. ?Report any adverse effects and or reactions from the medicines to his/her outpatient provider promptly. ?Patient has been instructed & cautioned: To not engage in alcohol and or illegal drug use while on prescription medicines. ?In the event of worsening symptoms, patient is instructed to call the crisis hotline, 911 and or go to the nearest ED for appropriate evaluation and treatment of symptoms. ?To follow-up with his/her primary care provider for your other medical issues, concerns and or health care needs. ?   ? ?Please contact one of the following facilities to start medication management and therapy services:  ? ?[place] [facility] ?[address]  ?[place], [place] [address] ?[phone]  ? ?[place]  ?[place], [place] [address] ?[phone] ? ?[place]  ?[place], [place], [place] [address] ?[phone] ? ?[place]  ?[address]  ?[place], [place] [address] ?[phone] ? ?[facility]  ?[address] ?[place], [place] [address] ?[phone] ? ?[place]  ?[address],  ?[place], [place] [address] ?([phone]  ?Safety Plan ?ALYSABETH SCALIA will reach out to her family, call 911 or call mobile crisis, or go to nearest emergency room if condition worsens or if suicidal thoughts become active ?Patients' will follow up with resources given for outpatient psychiatric services (therapy/medication management).  ?The suicide prevention education provided includes the following: ?Suicide risk factors ?Suicide prevention and interventions ?National Suicide Hotline telephone number ?[facility] assessment telephone number ?[facility] Emergency Assistance 911 ?[place] and/or  Residential Mobile Crisis Unit telephone number ?Request made of family/significant other to:  presented to patient and daugther in law ?Remove weapons (e.g., guns, rifles, knives), all items previously/currently identified as safety concern.   ?Remove drugs/medications (over the counter, prescriptions, illicit drugs), all items previously/currently identified as a safety concern.   ?

## 2021-08-22 NOTE — ED Notes (Signed)
Pt discharged with  AVS.  AVS reviewed prior to discharge.  Pt alert, oriented, and ambulatory.  Safety maintained.  °

## 2021-08-24 ENCOUNTER — Telehealth (HOSPITAL_COMMUNITY): Payer: Self-pay | Admitting: Licensed Clinical Social Worker

## 2021-08-24 ENCOUNTER — Encounter (HOSPITAL_COMMUNITY): Payer: Self-pay

## 2021-08-24 ENCOUNTER — Other Ambulatory Visit (HOSPITAL_COMMUNITY): Payer: Medicare Other | Attending: Psychiatry | Admitting: Licensed Clinical Social Worker

## 2021-08-24 DIAGNOSIS — F418 Other specified anxiety disorders: Secondary | ICD-10-CM | POA: Insufficient documentation

## 2021-08-24 DIAGNOSIS — R4589 Other symptoms and signs involving emotional state: Secondary | ICD-10-CM | POA: Insufficient documentation

## 2021-08-24 DIAGNOSIS — F332 Major depressive disorder, recurrent severe without psychotic features: Secondary | ICD-10-CM | POA: Insufficient documentation
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
## 2021-08-24 NOTE — Plan of Care (Signed)
?  Problem: Depression CCP Problem  1 Learn and Apply Coping Skills to Decrease Depression Symptoms   ?Goal: LTG: Erika [name] WILL SCORE LESS THAN 10 ON THE PATIENT HEALTH QUESTIONNAIRE (PHQ-9) ?Outcome: Not Applicable ?Goal: STG: Erika [name] WILL ATTEND AT LEAST 80% OF SCHEDULED PHP SESSIONS ?Outcome: Not Applicable ?Goal: STG: Erika [name] WILL ATTEND AT LEAST 80% OF SCHEDULED GROUP PSYCHOTHERAPY SESSIONS ?Outcome: Not Applicable ?Goal: STG: Erika [name] WILL COMPLETE AT LEAST 80% OF ASSIGNED HOMEWORK ?Outcome: Not Applicable ?Goal: STG: Reduce overall depression score by a minimum of 25% on the Patient Health Questionnaire (PHQ-9) or the Montgomery-Asberg Depression Rating Scale (MADRS) ?Outcome: Not Applicable ?Goal: STG: Erika [name] WILL IDENTIFY AT LEAST 3 COGNITIVE PATTERNS AND BELIEFS THAT SUPPORT DEPRESSION ?Outcome: Not Applicable ?  ?

## 2021-08-28 ENCOUNTER — Other Ambulatory Visit (HOSPITAL_COMMUNITY): Payer: Medicare Other

## 2021-08-28 ENCOUNTER — Ambulatory Visit (HOSPITAL_COMMUNITY): Payer: Medicare Other

## 2021-08-29 ENCOUNTER — Ambulatory Visit (HOSPITAL_COMMUNITY): Payer: Medicare Other

## 2021-08-29 ENCOUNTER — Telehealth (HOSPITAL_COMMUNITY): Payer: Self-pay | Admitting: Licensed Clinical Social Worker

## 2021-08-29 ENCOUNTER — Other Ambulatory Visit (HOSPITAL_COMMUNITY): Payer: Medicare Other

## 2021-08-29 ENCOUNTER — Other Ambulatory Visit (HOSPITAL_COMMUNITY): Payer: Medicare Other | Admitting: Licensed Clinical Social Worker

## 2021-08-29 DIAGNOSIS — F418 Other specified anxiety disorders: Secondary | ICD-10-CM | POA: Diagnosis not present

## 2021-08-29 DIAGNOSIS — F332 Major depressive disorder, recurrent severe without psychotic features: Secondary | ICD-10-CM

## 2021-08-29 DIAGNOSIS — R4589 Other symptoms and signs involving emotional state: Secondary | ICD-10-CM | POA: Diagnosis not present

## 2021-08-30 ENCOUNTER — Other Ambulatory Visit (HOSPITAL_COMMUNITY): Payer: Medicare Other | Admitting: Licensed Clinical Social Worker

## 2021-08-30 ENCOUNTER — Encounter (HOSPITAL_COMMUNITY): Payer: Self-pay

## 2021-08-30 ENCOUNTER — Other Ambulatory Visit (HOSPITAL_COMMUNITY): Payer: Medicare Other

## 2021-08-30 DIAGNOSIS — R4589 Other symptoms and signs involving emotional state: Secondary | ICD-10-CM

## 2021-08-30 DIAGNOSIS — F418 Other specified anxiety disorders: Secondary | ICD-10-CM | POA: Diagnosis not present

## 2021-08-30 DIAGNOSIS — F332 Major depressive disorder, recurrent severe without psychotic features: Secondary | ICD-10-CM
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
## 2021-08-30 NOTE — Therapy (Signed)
[facility] ?[place], [place], [address] ?Phone: [phone]   Fax:  [phone] ? ?Occupational Therapy Evaluation ?Virtual Visit via Video Note ? ?I connected with Erika [name] on 08/30/21 at  8:00 AM EDT by a video enabled telemedicine application and verified that I am speaking with the correct person using two identifiers. ? ?Location: ?Patient: home ?Provider: office ?  ?I discussed the limitations of evaluation and management by telemedicine and the availability of in person appointments. The patient expressed understanding and agreed to proceed. ? ?  ?The patient was advised to call back or seek an in-person evaluation if the symptoms worsen or if the condition fails to improve as anticipated. ? ?I provided 50 minutes of non-face-to-face time during this encounter. ? ? ?Patient Details  ?Name: Erika [name] ?MRN: [id number] ?Date of Birth: 02/25/1956 ?No data recorded ? ?Encounter Date: 08/30/2021 ? ? OT End of Session - 08/30/21 1859   ? ? Visit Number 1   ? Number of Visits 20   ? Date for OT Re-Evaluation 09/29/21   ? Authorization Type working on OT bebefits at this time / will update as able   ? OT Start Time 0930   0930 - 1000 eval  ? OT Stop Time 1250   OT PHP Tx: 1200-1250: 50 minutes  ? OT Time Calculation (min) 200 min   ? Equipment Utilized During Treatment webex / power point   ? Activity Tolerance Patient tolerated treatment well   ? Behavior During Therapy [facility] for tasks assessed/performed   ? ?  ?  ? ?  ? ? ?Past Medical History:  ?Diagnosis Date  ? Anxiety   ? Arthritis   ? Bell's palsy   ? COPD (chronic obstructive pulmonary disease) ([place])   ? History of cardiac murmur as a child   ? Hyperlipidemia   ? Hypertension   ? Menopausal syndrome   ? Mild asthma   ? no inhalers since stopped smoking  ? Myocardial infarction [facility])   ? 2020--pt states no damage  ? Neuroma of foot   ? OSA on CPAP   ? Wears glasses   ? ? ?Past  Surgical History:  ?Procedure Laterality Date  ? CORONARY STENT INTERVENTION N/A 07/14/2021  ? Procedure: CORONARY STENT INTERVENTION;  Surgeon: [name], MD;  Location: [place] [facility];  Service: Cardiovascular;  Laterality: N/A;  ? ELBOW SURGERY Right 2002  ? repair tendon and nerve injury  ? EXCISION MORTON'S NEUROMA Left 2010  ? INTRAVASCULAR IMAGING/OCT N/A 07/14/2021  ? Procedure: INTRAVASCULAR IMAGING/OCT;  Surgeon: [name], MD;  Location: [place] [facility];  Service: Cardiovascular;  Laterality: N/A;  ? INTRAVASCULAR PRESSURE WIRE/FFR STUDY N/A 07/14/2021  ? Procedure: INTRAVASCULAR PRESSURE WIRE/FFR STUDY;  Surgeon: [name], MD;  Location: [place] [facility];  Service: Cardiovascular;  Laterality: N/A;  ? LEFT HEART CATH AND CORONARY ANGIOGRAPHY N/A 07/14/2021  ? Procedure: LEFT HEART CATH AND CORONARY ANGIOGRAPHY;  Surgeon: [name], MD;  Location: [place] [place] [facility];  Service: Cardiovascular;  Laterality: N/A;  ? ROBOTIC ASSISTED TOTAL HYSTERECTOMY WITH BILATERAL SALPINGO OOPHERECTOMY N/A 12/31/2017  ? Procedure: XI ROBOTIC ASSISTED TOTAL HYSTERECTOMY WITH BILATERAL SALPINGO OOPHORECTOMY;  Surgeon: Everitt Amber, MD;  Location: [facility];  Service: Gynecology;  Laterality: N/A;  ? SHOULDER ARTHROSCOPY W/ ROTATOR CUFF REPAIR Left 09/2016  ? and burectomy  ? ? ?There were no vitals filed for this  visit. ? ? Subjective Assessment - 08/30/21 1854   ? ? Pertinent History MDD, RCR, Anxiety, OSA, vertigo   ? Patient Stated Goals Improve coping skills, routines, goal setting and stress mgmt   ? Currently in Pain? No/denies   ? Pain Score 0-No pain   ? Multiple Pain Sites No   ? ?  ?  ? ?  ? ? ?OT Assessment ? ?Diagnosis: MDD / Anxiety /  ?Past medical history/referral information: OSA / vertigo ?Living situation: w/ spouse / [facility] / drives  ?ADLs: physically capable w/o any deficits in body systems that might inhibit her ability  ?Work: 4 years retired  ?Leisure: minimal / enjoys  gardening  ?Social support: minimal  ?Struggles: coping / stress / communications  ?OT goal: improve communications / cpoing skills / time mgmt / routine and goal building skills  ? ?[place] Summary of Client Scores: ? Facilitates participation in occupation [place] participation in occupation Inhibits participation in occupation Restricts participation in occupation Comments:  ?Roles    X   ?Habits   X    ?Personal Causation    X   ?Values    X   ?Interests    X   ?Skills   X    ?Short-Term Goals    X   ?Long-term Goals    X   ?Interpretation of Past Experiences   X    ?Physical Environment    X   ?Social Environment    X   ?Readiness for Change  X     ? ? Need for Occupational Therapy: ?    ?    ?    ? 1 Need for extensive OT intervention indicated to improve participation.  Referral for follow up services also recommended.   ?Assessment:  Patient demonstrates behavior that RESTRICTS participation in occupation.  Patient will benefit from occupational therapy intervention in order to improve time management, financial management, stress management, job readiness skills, social skills, and health management skills in preparation to return to full time community living and to be a productive community member.   ? ?Plan:  Patient will participate in skilled occupational therapy sessions individually or in a group setting to improve coping skills, psychosocial skills, and emotional skills required to return to prior level of function. ?Treatment will be 4-5 times per week for 4 weeks.   ? ?Group Session: ? ?S: "I feel like I don't have anyone and I am really lonely and don't know how ot use the time I do have. I can't bring myself to do any of the things I used to enjoy doing" ? ?O: During the group session, the participants discussed the importance of having a growth mindset and shared personal stories of how they have bounced back from challenging situations. The cognitive domain was addressed  through the presentation of a PowerPoint on resiliency, which included information on the cognitive-behavioral strategies for building resiliency, such as positive self-talk and reframing negative thoughts. The affective domain was addressed as participants were encouraged to reflect on their emotional responses to challenging situations and to identify effective coping strategies. The patient, who was an active participant, shared personal experiences and coping strategies that worked for them, demonstrating an understanding of the affective domain. The psychomotor domain was also addressed as participants were encouraged to reflect on their physical reactions to stress and to identify effective stress-reducing techniques, such as deep breathing exercises and progressive muscle relaxation. The patient demonstrated an understanding of  the psychomotor domain by sharing personal experiences of using relaxation techniques to manage stress. Overall, the session appeared to address each of the three domains, and the patient demonstrated an understanding of each domain through active participation in the group discussion. ? ?A:  The patient demonstrated an understanding of resiliency and appeared to be motivated to continue building their own resiliency skills. The therapist will continue to encourage the patient to share their experiences and strategies in future group sessions. ? ?P: Continue to attend PHP OT group sessions 5x week for 4 weeks to promote daily structure, social engagement, and opportunities to develop and utilize adaptive strategies to maximize functional performance in preparation for safe transition and integration back into school, work, and the community. Plan to address topic of Building Resilience in Adults with Depression and Anxiety 2/2 in next OT group session. ? ? ? ? ? ? ? ? ? ? ? ? ? ? ? ? ? ? ? ? OT Education - 08/30/21 1857   ? ? Education Details OT Role and PHP goals and purpose /  Building Resilience in Adults with Depression and Anxiety part 1/2 for PHP group Tx #1   ? Person(s) Educated Patient   ? Methods Explanation;Demonstration;Handout   ? Comprehension Need further instruction   ? ?  ?  ?

## 2021-08-31 ENCOUNTER — Other Ambulatory Visit (HOSPITAL_COMMUNITY): Payer: Medicare Other

## 2021-08-31 ENCOUNTER — Other Ambulatory Visit (HOSPITAL_COMMUNITY): Payer: Medicare Other | Admitting: Licensed Clinical Social Worker

## 2021-08-31 ENCOUNTER — Encounter (HOSPITAL_COMMUNITY): Payer: Self-pay

## 2021-08-31 DIAGNOSIS — R4589 Other symptoms and signs involving emotional state: Secondary | ICD-10-CM | POA: Diagnosis not present

## 2021-08-31 DIAGNOSIS — F332 Major depressive disorder, recurrent severe without psychotic features: Secondary | ICD-10-CM

## 2021-08-31 DIAGNOSIS — F418 Other specified anxiety disorders: Secondary | ICD-10-CM | POA: Diagnosis not present

## 2021-08-31 NOTE — Therapy (Signed)
[place], [place], [address] Phone: [phone]   Fax:  [phone]  Occupational Therapy Treatment  Virtual Visit via Video Note  I connected with Erika [name] on 08/31/21 at  8:00 AM EDT by a video enabled telemedicine application and verified that I am speaking with the correct person using two identifiers.  Location: Patient: home Provider: office   I discussed the limitations of evaluation and management by telemedicine and the availability of in person appointments. The patient expressed understanding and agreed to proceed.    The patient was advised to call back or seek an in-person evaluation if the symptoms worsen or if the condition fails to improve as anticipated.  I provided 55 minutes of non-face-to-face time during this encounter.   Patient Details  Name: Erika [name] MRN: [id number] Date of Birth: 01/25/1956 No data recorded  Encounter Date: 08/31/2021   OT End of Session - 08/31/21 1348     Visit Number 2    Number of Visits 20    Date for OT Re-Evaluation 09/29/21    Authorization Type working on OT bebefits at this time / will update as able    OT Start Time 1200    OT Stop Time 1255    OT Time Calculation (min) 55 min    Equipment Utilized During Treatment webex / power point    Activity Tolerance Patient tolerated treatment well    Behavior During Therapy WFL for tasks assessed/performed             Past Medical History:  Diagnosis Date   Anxiety    Arthritis    Bell's palsy    COPD (chronic obstructive pulmonary disease) ([place])    History of cardiac murmur as a child    Hyperlipidemia    Hypertension    Menopausal syndrome    Mild asthma    no inhalers since stopped smoking   Myocardial infarction ([place])    2020--pt states no damage   Neuroma of foot    OSA on CPAP    Wears glasses     Past Surgical History:  Procedure Laterality Date   CORONARY  STENT INTERVENTION N/A 07/14/2021   Procedure: CORONARY STENT INTERVENTION;  Surgeon: [name], MD;  Location: [place] [facility];  Service: Cardiovascular;  Laterality: N/A;   ELBOW SURGERY Right 2002   repair tendon and nerve injury   EXCISION MORTON'S NEUROMA Left 2010   INTRAVASCULAR IMAGING/OCT N/A 07/14/2021   Procedure: INTRAVASCULAR IMAGING/OCT;  Surgeon: [name], MD;  Location: [place] [facility];  Service: Cardiovascular;  Laterality: N/A;   INTRAVASCULAR PRESSURE WIRE/FFR STUDY N/A 07/14/2021   Procedure: INTRAVASCULAR PRESSURE WIRE/FFR STUDY;  Surgeon: [name], MD;  Location: [place] [facility];  Service: Cardiovascular;  Laterality: N/A;   LEFT HEART CATH AND CORONARY ANGIOGRAPHY N/A 07/14/2021   Procedure: LEFT HEART CATH AND CORONARY ANGIOGRAPHY;  Surgeon: [name], MD;  Location: [place] [facility];  Service: Cardiovascular;  Laterality: N/A;   ROBOTIC ASSISTED TOTAL HYSTERECTOMY WITH BILATERAL SALPINGO OOPHERECTOMY N/A 12/31/2017   Procedure: XI ROBOTIC ASSISTED TOTAL HYSTERECTOMY WITH BILATERAL SALPINGO OOPHORECTOMY;  Surgeon: Everitt Amber, MD;  [facility];  Service: Gynecology;  Laterality: N/A;   SHOULDER ARTHROSCOPY W/ ROTATOR CUFF REPAIR Left 09/2016   and burectomy    There were no vitals filed for this visit.   Subjective Assessment - 08/31/21 1347  Currently in Pain? No/denies    Pain Score 0-No pain             Group Session:  S: "Since I retired about four years ago, it's been hard for me to connect to any kind of meaningful routine or bounce back from my troubles"  O: During the group session, the participants discussed the importance of having a growth mindset and shared personal stories of how they have bounced back from challenging situations. The cognitive domain was addressed through the presentation of a PowerPoint on resiliency, which included information on the cognitive-behavioral strategies for building  resiliency, such as positive self-talk and reframing negative thoughts. The affective domain was addressed as participants were encouraged to reflect on their emotional responses to challenging situations and to identify effective coping strategies. The patient, who was an active participant, shared personal experiences and coping strategies that worked for them, demonstrating an understanding of the affective domain. The psychomotor domain was also addressed as participants were encouraged to reflect on their physical reactions to stress and to identify effective stress-reducing techniques, such as deep breathing exercises and progressive muscle relaxation. The patient demonstrated an understanding of the psychomotor domain by sharing personal experiences of using relaxation techniques to manage stress. Overall, the session appeared to address each of the three domains, and the patient demonstrated an understanding of each domain through active participation in the group discussion.   A: The patient demonstrated an understanding of resiliency and appeared to be motivated to continue building their own resiliency skills. The therapist will continue to encourage the patient to share their experiences and strategies in future group sessions.  P: Continue to attend PHP OT group sessions 5x week for 4 weeks to promote daily structure, social engagement, and opportunities to develop and utilize adaptive strategies to maximize functional performance in preparation for safe transition and integration back into school, work, and the community. Plan to address topic of Finding Purpose for Improved ADLs in next OT group session.                      OT Education - 08/31/21 1348     Education Details Building Resilience in Adults with Depression and Anxiety 2/2    Person(s) Educated Patient    Methods Explanation;Demonstration;Handout    Comprehension Need further instruction               OT Short Term Goals - 08/30/21 1903       OT SHORT TERM GOAL #1   Title Pt will demonstrate independence in cpoing skills in order to perform and engage in improved relationships and community reentry    Time 4    Period Weeks    Status New    Target Date 09/29/21      OT SHORT TERM GOAL #2   Title pt will demonstrate independence with newly learned time mgmt skills to better and independently employ successful routines and structure at home and w/in community re-entry.                      Plan - 08/31/21 1348     Psychosocial Skills Coping Strategies;Habits;Routines and Behaviors;Interpersonal Interaction             Patient will benefit from skilled therapeutic intervention in order to improve the following deficits and impairments:       Psychosocial Skills: Coping Strategies, Habits, Routines and Behaviors, Interpersonal Interaction   Visit Diagnosis: Difficulty  coping    Problem List Patient Active Problem List   Diagnosis Date Noted   MDD (major depressive disorder), recurrent episode, severe ([place]) 08/24/2021   Hypertensive heart disease without heart failure 08/02/2021   Coronary artery disease involving native coronary artery of native heart without angina pectoris 08/02/2021   OSA on CPAP 08/02/2021   CAD (coronary artery disease) 07/14/2021   Hypervolemia 07/14/2021   Angina pectoris ([place])    Facial droop 01/02/2021   Dysphagia 01/02/2021   Hypertension 02/18/2019   Hyperlipidemia 11/18/2018   Congestive heart failure of unknown etiology ([place]) 10/09/2018   Vitamin D deficiency 08/19/2018   Hypothyroid 08/01/2018   B12 deficiency 08/01/2018   Bilateral ovarian cysts 12/31/2017   Routine general medical examination at a health care facility 06/14/2014   Cough 05/04/2013   Fatigue 01/14/2012   Skin tag 08/23/2011   Obstructive sleep apnea 11/30/2008   Anxiety state 01/15/2008   DEPRESSIVE DSORDER, RCR, FULL REMISSION 11/28/2006    BENIGN POSITIONAL VERTIGO 11/21/2006   ALLERGIC RHINITIS 11/21/2006    Brantley Stage, OT 08/31/2021, 1:49 PM  Cornell Barman, OT   [facility] HOSPITALIZATION PROGRAM [place], [place], [address] Phone: [phone]   Fax:  [phone]  Name: Erika [name] MRN: [id number] Date of Birth: [birth year]

## 2021-09-01 ENCOUNTER — Encounter (HOSPITAL_COMMUNITY): Payer: Self-pay

## 2021-09-01 ENCOUNTER — Other Ambulatory Visit (HOSPITAL_COMMUNITY): Payer: Medicare Other

## 2021-09-01 ENCOUNTER — Other Ambulatory Visit (HOSPITAL_COMMUNITY): Payer: Medicare Other | Admitting: Licensed Clinical Social Worker

## 2021-09-01 DIAGNOSIS — F418 Other specified anxiety disorders: Secondary | ICD-10-CM | POA: Diagnosis not present

## 2021-09-01 DIAGNOSIS — R4589 Other symptoms and signs involving emotional state: Secondary | ICD-10-CM

## 2021-09-01 DIAGNOSIS — F332 Major depressive disorder, recurrent severe without psychotic features: Secondary | ICD-10-CM | POA: Diagnosis not present

## 2021-09-01 NOTE — Therapy (Signed)
[facility] [place], [place], [address] Phone: [phone]   Fax:  [phone]  Occupational Therapy Treatment Virtual Visit via Video Note  I connected with Erika [name] on 09/01/21 at  8:00 AM EDT by a video enabled telemedicine application and verified that I am speaking with the correct person using two identifiers.  Location: Patient: home Provider: office   I discussed the limitations of evaluation and management by telemedicine and the availability of in person appointments. The patient expressed understanding and agreed to proceed.    The patient was advised to call back or seek an in-person evaluation if the symptoms worsen or if the condition fails to improve as anticipated.  I provided 55 minutes of non-face-to-face time during this encounter.   Patient Details  Name: Erika [name] MRN: [id number] Date of Birth: [birth year] No data recorded  Encounter Date: 09/01/2021   OT End of Session - 09/01/21 1834     Visit Number 3    Number of Visits 20    Date for OT Re-Evaluation 09/29/21    Authorization Type MCR 80/20%   [employer] N 1/1-12/31/2023   Copay 50.00 No visit limit follows MCR guidelines   And set for automatic crossover.   S/w Erika [name] [id number]    OT Start Time 1200    OT Stop Time 1255    OT Time Calculation (min) 55 min    Equipment Utilized During Treatment webex / power point    Activity Tolerance Patient tolerated treatment well    Behavior During Therapy WFL for tasks assessed/performed             Past Medical History:  Diagnosis Date   Anxiety    Arthritis    Bell's palsy    COPD (chronic obstructive pulmonary disease) ([facility])    History of cardiac murmur as a child    Hyperlipidemia    Hypertension    Menopausal syndrome    Mild asthma    no inhalers since stopped smoking   Myocardial infarction ([facility])    2020--pt states no damage   Neuroma of foot    OSA  on CPAP    Wears glasses     Past Surgical History:  Procedure Laterality Date   CORONARY STENT INTERVENTION N/A 07/14/2021   Procedure: CORONARY STENT INTERVENTION;  Surgeon: [name], MD;  Location: [facility];  Service: Cardiovascular;  Laterality: N/A;   ELBOW SURGERY Right 2002   repair tendon and nerve injury   EXCISION MORTON'S NEUROMA Left 2010   INTRAVASCULAR IMAGING/OCT N/A 07/14/2021   Procedure: INTRAVASCULAR IMAGING/OCT;  Surgeon: [name], MD;  Location: [facility];  Service: Cardiovascular;  Laterality: N/A;   INTRAVASCULAR PRESSURE WIRE/FFR STUDY N/A 07/14/2021   Procedure: INTRAVASCULAR PRESSURE WIRE/FFR STUDY;  Surgeon: [name], MD;  Location: [facility];  Service: Cardiovascular;  Laterality: N/A;   LEFT HEART CATH AND CORONARY ANGIOGRAPHY N/A 07/14/2021   Procedure: LEFT HEART CATH AND CORONARY ANGIOGRAPHY;  Surgeon: [name], MD;  Location: [facility];  Service: Cardiovascular;  Laterality: N/A;   ROBOTIC ASSISTED TOTAL HYSTERECTOMY WITH BILATERAL SALPINGO OOPHERECTOMY N/A 12/31/2017   Procedure: XI ROBOTIC ASSISTED TOTAL HYSTERECTOMY WITH BILATERAL SALPINGO OOPHORECTOMY;  Surgeon: Everitt Amber, MD;  Location: [facility];  Service: Gynecology;  Laterality: N/A;   SHOULDER ARTHROSCOPY W/ ROTATOR CUFF REPAIR Left 09/2016   and burectomy  There were no vitals filed for this visit.   Subjective Assessment - 09/01/21 1834     Pain Score 0-No pain    Multiple Pain Sites No               Group Session:  S: I've come to better understand that depression is partly a sign that something is wrong with how I am living life and not completely something that is beyond my control. By finding purpose, I can improve my overall sense of well-being and mental health. That's a new one for me"  O:  In this group therapy session, the objective was to explore the multifaceted concept of purpose using insights from  occupational therapy, anthropology, psychology, and life coaching. The participants engaged in a deep exploration of the innate human impulse for purposeful engagement and the impact of purpose on mental and emotional well-being. The discussion centered around the challenges of modern life, including feelings of isolation despite technological advancements, the connection between purposelessness and depression/anxiety, and the importance of occupational roles in achieving fulfillment. Strategies were shared to identify areas of life where purpose is lacking and to develop immediate and actionable plans to overcome these challenges. The participants gained valuable insights into the intersection of purpose and mental health, as well as practical tools to navigate their personal journeys towards [place] lives. Overall, the session fostered a sense of empowerment and inspired the participants to take proactive steps towards aligning their lives with their true purpose.  A: During the group therapy session, Patient displayed a high level of engagement and active participation. They actively contributed to the discussions, sharing personal insights and experiences related to the concept of purpose. Patient demonstrated a clear understanding of the relationship between purpose and mental well-being, acknowledging the importance of aligning actions with values and setting meaningful goals. They actively engaged in self-reflection exercises and expressed a commitment to incorporating strategies discussed in the session into their daily life. Patient exhibited a strong motivation to cultivate purpose and showed a willingness to take proactive steps towards living a more purposeful and fulfilling life. Overall, Patient's active participation and enthusiasm contributed to a positive and collaborative therapeutic environment.  P: Continue to attend PHP OT group sessions 5x week for 4 weeks to promote daily structure,  social engagement, and opportunities to develop and utilize adaptive strategies to maximize functional performance in preparation for safe transition and integration back into school, work, and the community. Plan to address topic of Unveiling the Path to Personal Fulfillment / Discovering Purpose 2/2 in next OT group session.                    OT Education - 09/01/21 1834     Education Details Unveiling the Path to Personal Fulfillment / Discovering Purpose 1/2    Person(s) Educated Patient    Methods Explanation;Demonstration;Handout    Comprehension Need further instruction              OT Short Term Goals - 08/30/21 1903       OT SHORT TERM GOAL #1   Title Pt will demonstrate independence in cpoing skills in order to perform and engage in improved relationships and community reentry    Time 4    Period Weeks    Status New    Target Date 09/29/21      OT SHORT TERM GOAL #2   Title pt will demonstrate independence with newly learned time mgmt skills to better and independently  employ successful routines and structure at home and w/in community re-entry.                      Plan - 09/01/21 1835     Psychosocial Skills Coping Strategies;Habits;Routines and Behaviors;Interpersonal Interaction             Patient will benefit from skilled therapeutic intervention in order to improve the following deficits and impairments:       Psychosocial Skills: Coping Strategies, Habits, Routines and Behaviors, Interpersonal Interaction   Visit Diagnosis: Difficulty coping    Problem List Patient Active Problem List   Diagnosis Date Noted   MDD (major depressive disorder), recurrent episode, severe ([facility]) 08/24/2021   Hypertensive heart disease without heart failure 08/02/2021   Coronary artery disease involving native coronary artery of native heart without angina pectoris 08/02/2021   OSA on CPAP 08/02/2021   CAD (coronary artery disease)  07/14/2021   Hypervolemia 07/14/2021   Angina pectoris ([facility])    Facial droop 01/02/2021   Dysphagia 01/02/2021   Hypertension 02/18/2019   Hyperlipidemia 11/18/2018   Congestive heart failure of unknown etiology ([facility]) 10/09/2018   Vitamin D deficiency 08/19/2018   Hypothyroid 08/01/2018   B12 deficiency 08/01/2018   Bilateral ovarian cysts 12/31/2017   Routine general medical examination at a health care facility 06/14/2014   Cough 05/04/2013   Fatigue 01/14/2012   Skin tag 08/23/2011   Obstructive sleep apnea 11/30/2008   Anxiety state 01/15/2008   DEPRESSIVE DSORDER, RCR, FULL REMISSION 11/28/2006   BENIGN POSITIONAL VERTIGO 11/21/2006   ALLERGIC RHINITIS 11/21/2006    Brantley Stage, OT 09/01/2021, 6:36 PM  [facility] PARTIAL HOSPITALIZATION PROGRAM [place], [place], [address] Phone: [phone]   Fax:  [phone]  Name: Erika [name] MRN: [id number] Date of Birth: [birth year]

## 2021-09-03 ENCOUNTER — Encounter (HOSPITAL_COMMUNITY): Payer: Self-pay | Admitting: Family

## 2021-09-03 NOTE — Progress Notes (Addendum)
Virtual Visit via Video Note  I connected with Erika [name] on 09/01/21 at  9:00 AM EDT by a video enabled telemedicine application and verified that I am speaking with the correct person using two identifiers.  Location: Patient: Home Provider: Office   I discussed the limitations of evaluation and management by telemedicine and the availability of in person appointments. The patient expressed understanding and agreed to proceed.   I discussed the assessment and treatment plan with the patient. The patient was provided an opportunity to ask questions and all were answered. The patient agreed with the plan and demonstrated an understanding of the instructions.   The patient was advised to call back or seek an in-person evaluation if the symptoms worsen or if the condition fails to improve as anticipated.  I provided 15 minutes of non-face-to-face time during this encounter.   Derrill Center, NP    Behavioral Health Partial Program Assessment Note  Date: 09/03/2021 Name: RAIZY AUZENNE MRN: [id number]  Chief Complaint: Feeling stressed and overwhelmed  Subjective: "I reached my breaking point"   HPI: Aysia Lowder is a 66 y.o. Caucasian female presents with worsening depression and anxiety.  She reports becoming stressed and overwhelmed.  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  States lately she has not been able to sleep well, she stated that her sleep is not restful and is "broken."  Stated that she is "up and down all night."   She denies any drugs or illicit drug use.  Denies that her husband is supportive.  States she cares for the 2 sons 1 of which is her stepson.  She reports she is followed by her primary care provider Dr. Jerilee Hoh where she is prescribed Wellbutrin, Celexa Lasix and Plavix.  Reports she was prescribed 80 mg of Celexa denied diagnosis with obsessive-compulsive disorder.  Denied history with seizure disorder.  States she is  recently titrated downward to 20 mg of Celexa which she does not feel medication is helpful.    Discussed initiating Abilify however patient declined at this time.  We will continue to monitor symptoms.  We will make trazodone available for sleep patient was receptive to plan patient was enrolled in partial psychiatric program on 09/03/21.  Primary complaints include: anxiety, difficulty sleeping, and fear of going crazy.  Onset of symptoms was abrupt with gradually worsening course since that time. Psychosocial Stressors include the following: family and financial.   I have reviewed the following documentation dated 09/03/2021: past psychiatric history  Complaints of Pain: nonear Past Psychiatric History:  None  Currently in treatment with Wellbutrin, Celexa.  Substance Abuse History: none Use of Alcohol: denied Use of Caffeine: denies use Use of over the counter:  Past Surgical History:  Procedure Laterality Date   CORONARY STENT INTERVENTION N/A 07/14/2021   Procedure: CORONARY STENT INTERVENTION;  Surgeon: [name], MD;  Location: [facility];  Service: Cardiovascular;  Laterality: N/A;   ELBOW SURGERY Right 2002   repair tendon and nerve injury   EXCISION MORTON'S NEUROMA Left 2010   INTRAVASCULAR IMAGING/OCT N/A 07/14/2021   Procedure: INTRAVASCULAR IMAGING/OCT;  Surgeon: [name], MD;  Location: [facility];  Service: Cardiovascular;  Laterality: N/A;   INTRAVASCULAR PRESSURE WIRE/FFR STUDY N/A 07/14/2021   Procedure: INTRAVASCULAR PRESSURE WIRE/FFR STUDY;  Surgeon: [name], MD;  Location: [facility];  Service: Cardiovascular;  Laterality: N/A;   LEFT HEART CATH AND CORONARY ANGIOGRAPHY  N/A 07/14/2021   Procedure: LEFT HEART CATH AND CORONARY ANGIOGRAPHY;  Surgeon: [name], MD;  Location: [facility];  Service: Cardiovascular;  Laterality: N/A;   ROBOTIC ASSISTED TOTAL HYSTERECTOMY WITH BILATERAL SALPINGO OOPHERECTOMY  N/A 12/31/2017   Procedure: XI ROBOTIC ASSISTED TOTAL HYSTERECTOMY WITH BILATERAL SALPINGO OOPHORECTOMY;  Surgeon: Everitt Amber, MD;  [facility];  Service: Gynecology;  Laterality: N/A;   SHOULDER ARTHROSCOPY W/ ROTATOR CUFF REPAIR Left 09/2016   and burectomy    Past Medical History:  Diagnosis Date   Anxiety    Arthritis    Bell's palsy    COPD (chronic obstructive pulmonary disease) ([facility])    History of cardiac murmur as a child    Hyperlipidemia    Hypertension    Menopausal syndrome    Mild asthma    no inhalers since stopped smoking   Myocardial infarction ([facility])    2020--pt states no damage   Neuroma of foot    OSA on CPAP    Wears glasses    Outpatient Encounter Medications as of 09/01/2021  Medication Sig   acetaminophen (TYLENOL) 650 MG CR tablet Take 650-1,300 mg by mouth every 8 (eight) hours as needed for pain.   albuterol (VENTOLIN HFA) 108 (90 Base) MCG/ACT inhaler Inhale 2 puffs into the lungs every 4 (four) hours as needed for wheezing or shortness of breath.   aspirin EC 81 MG tablet Take 1 tablet (81 mg total) by mouth daily. Swallow whole.   atorvastatin (LIPITOR) 40 MG tablet Take 1 tablet (40 mg total) by mouth daily. (Patient taking differently: Take 40 mg by mouth every evening.)   buPROPion (WELLBUTRIN XL) 150 MG 24 hr tablet Take 1 tablet (150 mg total) by mouth daily. (Patient taking differently: Take 150 mg by mouth every evening.)   cetirizine (ZYRTEC) 10 MG tablet Take 10 mg by mouth daily as needed for allergies.   Cholecalciferol (VITAMIN D3 SUPER STRENGTH) 50 MCG (2000 UT) CAPS Take 2,000 Units by mouth every evening.   citalopram (CELEXA) 20 MG tablet Take 1 tablet (20 mg total) by mouth at bedtime.   clopidogrel (PLAVIX) 75 MG tablet Take 1 tablet (75 mg total) by mouth daily.   cyanocobalamin (,VITAMIN B-12,) 1000 MCG/ML injection INJECT 1 ML (1,000 MCG TOTAL) INTO THE MUSCLE EVERY 30 DAYS.   diphenhydramine-acetaminophen (TYLENOL PM) 25-500 MG  TABS tablet Take 1 tablet by mouth at bedtime as needed (sleep).   furosemide (LASIX) 20 MG tablet Take 1 tablet (20 mg total) by mouth daily.   hydrochlorothiazide (HYDRODIURIL) 25 MG tablet Take 1 tablet (25 mg total) by mouth daily. (Patient taking differently: Take 25 mg by mouth every evening.)   levothyroxine (SYNTHROID) 75 MCG tablet Take 1 tablet (75 mcg total) by mouth daily before breakfast.   metoprolol succinate (TOPROL XL) 25 MG 24 hr tablet Take 0.5 tablets (12.5 mg total) by mouth daily. (Patient taking differently: Take 12.5 mg by mouth every evening.)   nitroGLYCERIN (NITROSTAT) 0.4 MG SL tablet Place 1 tablet (0.4 mg total) under the tongue every 5 (five) minutes as needed for chest pain. Up to 3 times.   polyvinyl alcohol (LIQUIFILM TEARS) 1.4 % ophthalmic solution Place 1 drop into both eyes 5 (five) times daily. (Patient taking differently: Place 1 drop into both eyes 5 (five) times daily as needed for dry eyes.)   SYRINGE-NEEDLE, DISP, 3 ML (BD SAFETYGLIDE SYRINGE/NEEDLE) 25G X 1" 3 ML MISC Use for B12 injections   No  facility-administered encounter medications on file as of 09/01/2021.   Allergies  Allergen Reactions   Tape Other (See Comments)    PATIENT PREFERS CLOTH TAPE- THE OTHERS DON'T AGREE WITH HER SKIN   Tetracycline Hcl Rash    Social History   Tobacco Use   Smoking status: Former    Packs/day: 1.00    Years: 30.00    Pack years: 30.00    Types: Cigarettes    Quit date: 06/01/2000    Years since quitting: 21.2   Smokeless tobacco: Never  Substance Use Topics   Alcohol use: No   Functioning Relationships: strained with spouse or significant others Education: Other  Family History  Problem Relation Age of Onset   Depression Mother    Diabetes Mother    Heart disease Mother    Asthma Sister    Cancer Brother    Cancer Maternal Aunt    Cancer Maternal Uncle    Uterine cancer Maternal Uncle    Diabetes Maternal Grandfather    Diabetes Maternal  Grandmother    Cancer Paternal Grandmother    Colon cancer Neg Hx    Colon polyps Neg Hx    Esophageal cancer Neg Hx    Rectal cancer Neg Hx    Stomach cancer Neg Hx      Review of Systems Constitutional: negative  Objective:  There were no vitals filed for this visit.  Physical Exam:   Mental Status Exam: Appearance:  Well groomed Psychomotor::  Within Normal Limits Attention span and concentration: Normal Behavior: calm, cooperative, and adequate rapport can be established Speech:  normal volume Mood:  depressed and anxious Affect:  normal Thought Process:  Coherent Thought Content:  Logical Orientation:  person, place, and time/date Cognition:  grossly intact Insight:  Intact Judgment:  Intact Estimate of Intelligence: Average Fund of knowledge: Aware of current events Memory: Recent and remote intact Abnormal movements: None Gait and station: Normal  Assessment:  Diagnosis: No primary diagnosis found. No diagnosis found.  Indications for admission: inpatient care required if not in partial hospital program  Plan: patient enrolled in Partial Hospitalization Program, patient's current medications are to be continued, a comprehensive treatment plan will be developed, and side effects of medications have been reviewed with patient  Collaboration of Care: Medication Management AEB we will continue to monitor symptoms and Psychiatrist AEB case management to follow-up at discharge  Patient/Guardian was advised Release of Information must be obtained prior to any record release in order to collaborate their care with an outside provider. Patient/Guardian was advised if they have not already done so to contact the registration department to sign all necessary forms in order for [place] to release information regarding their care.   Consent: Patient/Guardian gives verbal consent for treatment and assignment of benefits for services provided during this visit.  Patient/Guardian expressed understanding and agreed to proceed.   Treatment options and alternatives reviewed with patient and patient understands the above plan.  Treatment plan was reviewed and agreed upon by NPT Seraphine Gudiel and patient Lavergne Hiltunen need for group services    Derrill Center, NP

## 2021-09-04 ENCOUNTER — Encounter (HOSPITAL_COMMUNITY): Payer: Self-pay

## 2021-09-04 ENCOUNTER — Other Ambulatory Visit (HOSPITAL_COMMUNITY): Payer: Medicare Other | Admitting: Licensed Clinical Social Worker

## 2021-09-04 ENCOUNTER — Other Ambulatory Visit (HOSPITAL_COMMUNITY): Payer: Medicare Other

## 2021-09-04 DIAGNOSIS — F332 Major depressive disorder, recurrent severe without psychotic features: Secondary | ICD-10-CM

## 2021-09-04 DIAGNOSIS — F418 Other specified anxiety disorders: Secondary | ICD-10-CM | POA: Diagnosis not present

## 2021-09-04 DIAGNOSIS — R4589 Other symptoms and signs involving emotional state: Secondary | ICD-10-CM

## 2021-09-04 NOTE — Therapy (Signed)
[place], [place], [address] Phone: [phone]   Fax:  [phone]  Occupational Therapy Treatment  Virtual Visit via Video Note  I connected with Erika [name] on 09/04/21 at  8:00 AM EDT by a video enabled telemedicine application and verified that I am speaking with the correct person using two identifiers.  Location: Patient: home Provider: office   I discussed the limitations of evaluation and management by telemedicine and the availability of in person appointments. The patient expressed understanding and agreed to proceed.    The patient was advised to call back or seek an in-person evaluation if the symptoms worsen or if the condition fails to improve as anticipated.  I provided 60 minutes of non-face-to-face time during this encounter.   Patient Details  Name: Erika [name] MRN: [id number] Date of Birth: [birth year] No data recorded  Encounter Date: 09/04/2021   OT End of Session - 09/04/21 1555     Visit Number 4    Number of Visits 20    Date for OT Re-Evaluation 09/29/21    Authorization Type MCR 80/20%   [employer] N 1/1-12/31/2023   Copay 50.00 No visit limit follows MCR guidelines   And set for automatic crossover.   S/w Pernell Dupre [id number]    OT Start Time 1100    OT Stop Time 1200    OT Time Calculation (min) 60 min    Equipment Utilized During Treatment webex / power point    Activity Tolerance Patient tolerated treatment well    Behavior During Therapy WFL for tasks assessed/performed             Past Medical History:  Diagnosis Date   Anxiety    Arthritis    Bell's palsy    COPD (chronic obstructive pulmonary disease) ([place])    History of cardiac murmur as a child    Hyperlipidemia    Hypertension    Menopausal syndrome    Mild asthma    no inhalers since stopped smoking   Myocardial infarction ([place])    2020--pt states no damage   Neuroma of foot     OSA on CPAP    Wears glasses     Past Surgical History:  Procedure Laterality Date   CORONARY STENT INTERVENTION N/A 07/14/2021   Procedure: CORONARY STENT INTERVENTION;  Surgeon: [name], MD;  Location: [facility];  Service: Cardiovascular;  Laterality: N/A;   ELBOW SURGERY Right 2002   repair tendon and nerve injury   EXCISION MORTON'S NEUROMA Left 2010   INTRAVASCULAR IMAGING/OCT N/A 07/14/2021   Procedure: INTRAVASCULAR IMAGING/OCT;  Surgeon: [name], MD;  Location: [facility];  Service: Cardiovascular;  Laterality: N/A;   INTRAVASCULAR PRESSURE WIRE/FFR STUDY N/A 07/14/2021   Procedure: INTRAVASCULAR PRESSURE WIRE/FFR STUDY;  Surgeon: [name], MD;  Location: [facility];  Service: Cardiovascular;  Laterality: N/A;   LEFT HEART CATH AND CORONARY ANGIOGRAPHY N/A 07/14/2021   Procedure: LEFT HEART CATH AND CORONARY ANGIOGRAPHY;  Surgeon: [name], MD;  Location: [facility];  Service: Cardiovascular;  Laterality: N/A;   ROBOTIC ASSISTED TOTAL HYSTERECTOMY WITH BILATERAL SALPINGO OOPHERECTOMY N/A 12/31/2017   Procedure: XI ROBOTIC ASSISTED TOTAL HYSTERECTOMY WITH BILATERAL SALPINGO OOPHORECTOMY;  Surgeon: Everitt Amber, MD;  [facility];  Service: Gynecology;  Laterality: N/A;   SHOULDER ARTHROSCOPY W/ ROTATOR CUFF REPAIR Left 09/2016   and burectomy  There were no vitals filed for this visit.   Subjective Assessment - 09/04/21 1554     Currently in Pain? No/denies    Pain Score 0-No pain    Multiple Pain Sites No              Group Session:  S: "Feeling better. Still having the same trouble where I feel like I am not being seen or even heard by my partner"  O: In this group therapy session, the objective was to explore the multifaceted concept of purpose using insights from occupational therapy, anthropology, psychology, and life coaching. The participants engaged in a deep exploration of the innate human  impulse for purposeful engagement and the impact of purpose on mental and emotional well-being. The discussion centered around the challenges of modern life, including feelings of isolation despite technological advancements, the connection between purposelessness and depression/anxiety, and the importance of occupational roles in achieving fulfillment. Strategies were shared to identify areas of life where purpose is lacking and to develop immediate and actionable plans to overcome these challenges. The participants gained valuable insights into the intersection of purpose and mental health, as well as practical tools to navigate their personal journeys towards [place] lives. Overall, the session fostered a sense of empowerment and inspired the participants to take proactive steps towards aligning their lives with their true purpose.  A: During the group therapy session, Patient displayed a high level of engagement and active participation. They actively contributed to the discussions, sharing personal insights and experiences related to the concept of purpose. Patient demonstrated a clear understanding of the relationship between purpose and mental well-being, acknowledging the importance of aligning actions with values and setting meaningful goals.   They actively engaged in self-reflection exercises and expressed a commitment to incorporating strategies discussed in the session into their daily life. Patient exhibited a strong motivation to cultivate purpose and showed a willingness to take proactive steps towards living a more purposeful and fulfilling life.   Overall, Patient's active participation and enthusiasm contributed to a positive and collaborative therapeutic environment.  P: Continue to attend PHP OT group sessions 5x week for 4 weeks to promote daily structure, social engagement, and opportunities to develop and utilize adaptive strategies to maximize functional performance in  preparation for safe transition and integration back into school, work, and the community. Plan to address topic of systems and goals in next OT group session.                     OT Education - 09/04/21 1555     Education Details Unveiling the Path to Personal Fulfillment / Discovering Purpose 2/2    Person(s) Educated Patient    Methods Explanation;Demonstration;Handout    Comprehension Need further instruction              OT Short Term Goals - 08/30/21 1903       OT SHORT TERM GOAL #1   Title Pt will demonstrate independence in cpoing skills in order to perform and engage in improved relationships and community reentry    Time 4    Period Weeks    Status New    Target Date 09/29/21      OT SHORT TERM GOAL #2   Title pt will demonstrate independence with newly learned time mgmt skills to better and independently employ successful routines and structure at home and w/in community re-entry.  Plan - 09/04/21 1556     Psychosocial Skills Coping Strategies;Habits;Routines and Behaviors;Interpersonal Interaction             Patient will benefit from skilled therapeutic intervention in order to improve the following deficits and impairments:       Psychosocial Skills: Coping Strategies, Habits, Routines and Behaviors, Interpersonal Interaction   Visit Diagnosis: Difficulty coping    Problem List Patient Active Problem List   Diagnosis Date Noted   MDD (major depressive disorder), recurrent episode, severe ([place]) 08/24/2021   Hypertensive heart disease without heart failure 08/02/2021   Coronary artery disease involving native coronary artery of native heart without angina pectoris 08/02/2021   OSA on CPAP 08/02/2021   CAD (coronary artery disease) 07/14/2021   Hypervolemia 07/14/2021   Angina pectoris ([place])    Facial droop 01/02/2021   Dysphagia 01/02/2021   Hypertension 02/18/2019   Hyperlipidemia 11/18/2018    Congestive heart failure of unknown etiology ([place]) 10/09/2018   Vitamin D deficiency 08/19/2018   Hypothyroid 08/01/2018   B12 deficiency 08/01/2018   Bilateral ovarian cysts 12/31/2017   Routine general medical examination at a health care facility 06/14/2014   Cough 05/04/2013   Fatigue 01/14/2012   Skin tag 08/23/2011   Obstructive sleep apnea 11/30/2008   Anxiety state 01/15/2008   DEPRESSIVE DSORDER, RCR, FULL REMISSION 11/28/2006   BENIGN POSITIONAL VERTIGO 11/21/2006   ALLERGIC RHINITIS 11/21/2006    Brantley Stage, OT 09/04/2021, 3:57 PM  Cornell Barman, OT   [facility] HOSPITALIZATION PROGRAM [place], [place], [address] Phone: [phone]   Fax:  [phone]  Name: ELIZETTE SHEK MRN: [id number] Date of Birth: January 07, 1956

## 2021-09-04 NOTE — Telephone Encounter (Signed)
See call intake 

## 2021-09-05 ENCOUNTER — Encounter (HOSPITAL_COMMUNITY): Payer: Self-pay

## 2021-09-05 ENCOUNTER — Other Ambulatory Visit (HOSPITAL_COMMUNITY): Payer: Medicare Other | Admitting: Licensed Clinical Social Worker

## 2021-09-05 ENCOUNTER — Other Ambulatory Visit (HOSPITAL_COMMUNITY): Payer: Medicare Other

## 2021-09-05 DIAGNOSIS — R4589 Other symptoms and signs involving emotional state: Secondary | ICD-10-CM

## 2021-09-05 DIAGNOSIS — F332 Major depressive disorder, recurrent severe without psychotic features: Secondary | ICD-10-CM | POA: Diagnosis not present

## 2021-09-05 DIAGNOSIS — F418 Other specified anxiety disorders: Secondary | ICD-10-CM | POA: Diagnosis not present

## 2021-09-05 MED ORDER — HYDROXYZINE PAMOATE 25 MG PO CAPS: 25.0000 mg | ORAL_CAPSULE | Freq: Every evening | ORAL | 0 refills | Status: DC | PRN

## 2021-09-05 NOTE — Progress Notes (Signed)
Virtual Visit via Video Note  I connected with Erika [name] on 09/10/21 at  9:00 AM EDT by a video enabled telemedicine application and verified that I am speaking with the correct person using two identifiers.  Location: Patient: Home Provider: Office   I discussed the limitations of evaluation and management by telemedicine and the availability of in person appointments. The patient expressed understanding and agreed to proceed.   I discussed the assessment and treatment plan with the patient. The patient was provided an opportunity to ask questions and all were answered. The patient agreed with the plan and demonstrated an understanding of the instructions.   The patient was advised to call back or seek an in-person evaluation if the symptoms worsen or if the condition fails to improve as anticipated.  I provided 15 minutes of non-face-to-face time during this encounter.   Derrill Center, NP   [facility] MD/PA/NP OP Progress Note  09/10/2021 3:46 PM Erika [name]  MRN:  [id number]  Chief Complaint: Levada Dy reported "  I am not able to stay asleep."   HPI: Millette was seen and evaluated via WebEx.  She is awake alert and oriented x3.  Denying suicidal homicidal ideations.  Denies auditory visual hallucinations.  Reports she was prescribed Wellbutrin and Celexa which she reports taking and tolerating well.  Reports concerns with staying asleep throughout the nights.  States she gets 5 to 6 hours of broken sleep.  Discussed initiating trazodone 25 mg nightly education provided with sitting on the side of the bed prior to standing.    Medication could cause excessive tiredness grogginess will follow-up for medication side effects.  Patient was receptive to plan.  She reports a good appetite. Support,encouragement and reassurance was provided. Visit Diagnosis:    ICD-10-CM   1. Severe episode of recurrent major depressive disorder, without psychotic features ([place])  F33.2     2.  Difficulty coping  R45.89       Past Psychiatric History:   Past Medical History:  Past Medical History:  Diagnosis Date   Anxiety    Arthritis    Bell's palsy    COPD (chronic obstructive pulmonary disease) ([place])    History of cardiac murmur as a child    Hyperlipidemia    Hypertension    Menopausal syndrome    Mild asthma    no inhalers since stopped smoking   Myocardial infarction ([place])    2020--pt states no damage   Neuroma of foot    OSA on CPAP    Wears glasses     Past Surgical History:  Procedure Laterality Date   CORONARY STENT INTERVENTION N/A 07/14/2021   Procedure: CORONARY STENT INTERVENTION;  Surgeon: [name], MD;  Location: [facility];  Service: Cardiovascular;  Laterality: N/A;   ELBOW SURGERY Right 2002   repair tendon and nerve injury   EXCISION MORTON'S NEUROMA Left 2010   INTRAVASCULAR IMAGING/OCT N/A 07/14/2021   Procedure: INTRAVASCULAR IMAGING/OCT;  Surgeon: [name], MD;  Location: [facility];  Service: Cardiovascular;  Laterality: N/A;   INTRAVASCULAR PRESSURE WIRE/FFR STUDY N/A 07/14/2021   Procedure: INTRAVASCULAR PRESSURE WIRE/FFR STUDY;  Surgeon: [name], MD;  Location: [facility];  Service: Cardiovascular;  Laterality: N/A;   LEFT HEART CATH AND CORONARY ANGIOGRAPHY N/A 07/14/2021   Procedure: LEFT HEART CATH AND CORONARY ANGIOGRAPHY;  Surgeon: [name], MD;  Location: [facility];  Service: Cardiovascular;  Laterality: N/A;  ROBOTIC ASSISTED TOTAL HYSTERECTOMY WITH BILATERAL SALPINGO OOPHERECTOMY N/A 12/31/2017   Procedure: XI ROBOTIC ASSISTED TOTAL HYSTERECTOMY WITH BILATERAL SALPINGO OOPHORECTOMY;  Surgeon: Everitt Amber, MD;  [facility];  Service: Gynecology;  Laterality: N/A;   SHOULDER ARTHROSCOPY W/ ROTATOR CUFF REPAIR Left 09/2016   and burectomy    Family Psychiatric History:   Family History:  Family History  Problem Relation Age of Onset   Depression Mother     Diabetes Mother    Heart disease Mother    Asthma Sister    Cancer Brother    Cancer Maternal Aunt    Cancer Maternal Uncle    Uterine cancer Maternal Uncle    Diabetes Maternal Grandfather    Diabetes Maternal Grandmother    Cancer Paternal Grandmother    Colon cancer Neg Hx    Colon polyps Neg Hx    Esophageal cancer Neg Hx    Rectal cancer Neg Hx    Stomach cancer Neg Hx     Social History:  Social History   Socioeconomic History   Marital status: Married    Spouse name: Not on file   Number of children: 2   Years of education: Not on file   Highest education level: 12th grade  Occupational History   Occupation: Solicitor: [employer]  Tobacco Use   Smoking status: Former    Packs/day: 1.00    Years: 30.00    Pack years: 30.00    Types: Cigarettes    Quit date: 06/01/2000    Years since quitting: 21.2   Smokeless tobacco: Never  Vaping Use   Vaping Use: Never used  Substance and Sexual Activity   Alcohol use: No   Drug use: Never   Sexual activity: Not Currently    Birth control/protection: Post-menopausal  Other Topics Concern   Not on file  Social History Narrative   Not on file   Social Determinants of Health   Financial Resource Strain: Low Risk    Difficulty of Paying Living Expenses: Not very hard  Food Insecurity: No Food Insecurity   Worried About Running Out of Food in the Last Year: Never true   Ran Out of Food in the Last Year: Never true  Transportation Needs: No Transportation Needs   Lack of Transportation (Medical): No   Lack of Transportation (Non-Medical): No  Physical Activity: Unknown   Days of Exercise per Week: 0 days   Minutes of Exercise per Session: Not on file  Stress: Stress Concern Present   Feeling of Stress : Very much  Social Connections: Unknown   Frequency of Communication with Friends and Family: Once a week   Frequency of Social Gatherings with Friends and Family: Patient refused   Attends  Religious Services: 1 to 4 times per year   Active Member of [employer] or Organizations: Yes   Attends Archivist Meetings: 1 to 4 times per year   Marital Status: Not on file    Allergies:  Allergies  Allergen Reactions   Tape Other (See Comments)    PATIENT PREFERS CLOTH TAPE- THE OTHERS DON'T AGREE WITH HER SKIN   Tetracycline Hcl Rash    Metabolic Disorder Labs: Lab Results  Component Value Date   HGBA1C 5.7 05/05/2020   No results found for: PROLACTIN Lab Results  Component Value Date   CHOL 155 09/22/2020   TRIG 110.0 09/22/2020   HDL 61.70 09/22/2020   CHOLHDL 3 09/22/2020   VLDL 22.0  09/22/2020   LDLCALC 71 09/22/2020   LDLCALC 162 (H) 05/05/2020   Lab Results  Component Value Date   TSH 2.80 05/05/2020   TSH 1.21 04/01/2019    Therapeutic Level Labs: No results found for: LITHIUM No results found for: VALPROATE No components found for:  CBMZ  Current Medications: Current Outpatient Medications  Medication Sig Dispense Refill   hydrOXYzine (VISTARIL) 25 MG capsule Take 1 capsule (25 mg total) by mouth at bedtime and may repeat dose one time if needed. 60 capsule 0   acetaminophen (TYLENOL) 650 MG CR tablet Take 650-1,300 mg by mouth every 8 (eight) hours as needed for pain.     albuterol (VENTOLIN HFA) 108 (90 Base) MCG/ACT inhaler Inhale 2 puffs into the lungs every 4 (four) hours as needed for wheezing or shortness of breath. 18 g 0   aspirin EC 81 MG tablet Take 1 tablet (81 mg total) by mouth daily. Swallow whole.     atorvastatin (LIPITOR) 40 MG tablet Take 1 tablet (40 mg total) by mouth daily. (Patient taking differently: Take 40 mg by mouth every evening.) 90 tablet 1   buPROPion (WELLBUTRIN XL) 150 MG 24 hr tablet Take 1 tablet (150 mg total) by mouth daily. (Patient taking differently: Take 150 mg by mouth every evening.) 90 tablet 1   cetirizine (ZYRTEC) 10 MG tablet Take 10 mg by mouth daily as needed for allergies.     Cholecalciferol  (VITAMIN D3 SUPER STRENGTH) 50 MCG (2000 UT) CAPS Take 2,000 Units by mouth every evening.     citalopram (CELEXA) 20 MG tablet Take 1 tablet (20 mg total) by mouth at bedtime. 30 tablet 1   clopidogrel (PLAVIX) 75 MG tablet Take 1 tablet (75 mg total) by mouth daily. 90 tablet 1   cyanocobalamin (,VITAMIN B-12,) 1000 MCG/ML injection INJECT 1 ML (1,000 MCG TOTAL) INTO THE MUSCLE EVERY 30 DAYS. 6 mL 2   diphenhydramine-acetaminophen (TYLENOL PM) 25-500 MG TABS tablet Take 1 tablet by mouth at bedtime as needed (sleep). (Patient not taking: Reported on 09/06/2021)     furosemide (LASIX) 20 MG tablet Take 1 tablet (20 mg total) by mouth daily. 90 tablet 1   hydrochlorothiazide (HYDRODIURIL) 25 MG tablet Take 1 tablet (25 mg total) by mouth daily. (Patient taking differently: Take 25 mg by mouth every evening.) 90 tablet 1   levothyroxine (SYNTHROID) 75 MCG tablet Take 1 tablet (75 mcg total) by mouth daily before breakfast. 90 tablet 1   metoprolol succinate (TOPROL XL) 25 MG 24 hr tablet Take 0.5 tablets (12.5 mg total) by mouth daily. (Patient taking differently: Take 12.5 mg by mouth every evening.) 45 tablet 3   nitroGLYCERIN (NITROSTAT) 0.4 MG SL tablet Place 1 tablet (0.4 mg total) under the tongue every 5 (five) minutes as needed for chest pain. Up to 3 times. 90 tablet 3   polyvinyl alcohol (LIQUIFILM TEARS) 1.4 % ophthalmic solution Place 1 drop into both eyes 5 (five) times daily. (Patient taking differently: Place 1 drop into both eyes 5 (five) times daily as needed for dry eyes.) 15 mL 0   SYRINGE-NEEDLE, DISP, 3 ML (BD SAFETYGLIDE SYRINGE/NEEDLE) 25G X 1" 3 ML MISC Use for B12 injections 100 each 11   No current facility-administered medications for this visit.     Musculoskeletal: Strength & Muscle Tone: within normal limits Gait & Station: normal Patient leans: N/A  Psychiatric Specialty Exam: Review of Systems  HENT: Negative.    Eyes: Negative.   Cardiovascular:  Negative.    Endocrine: Negative.   Allergic/Immunologic: Negative.   Psychiatric/Behavioral:  Positive for sleep disturbance. Negative for behavioral problems and suicidal ideas.   All other systems reviewed and are negative.  There were no vitals taken for this visit.There is no height or weight on file to calculate BMI.  General Appearance: Casual  Eye Contact:  Good  Speech:  Clear and Coherent  Volume:  Normal  Mood:  Anxious and Depressed  Affect:  Congruent  Thought Process:  Coherent  Orientation:  Full (Time, Place, and Person)  Thought Content: Logical   Suicidal Thoughts:  No  Homicidal Thoughts:  No  Memory:  Immediate;   Good Recent;   Good  Judgement:  Good  Insight:  Good  Psychomotor Activity:  Normal  Concentration:  Concentration: Good  Recall:  Good  Fund of Knowledge: Good  Language: Good  Akathisia:  No  Handed:  Right  AIMS (if indicated): done  Assets:  Communication Skills Desire for Improvement Resilience Social Support  ADL's:  Intact  Cognition: WNL  Sleep:  Poor   Screenings: Web designer from 08/30/2021 in [facility] Office Visit from 08/22/2021 in [facility] [place] at [facility] from 04/11/2021 in [place] at [facility] from 11/08/2020 in [place] at [facility] from 09/23/2020 in [place] at [facility] Total Score '6 6 2 2 1  '$ PHQ-9 Total Score '22 20 13 7 '$ --      Flowsheet Row Counselor from 08/30/2021 in [facility] Admission (Discharged) from 07/14/2021 in [facility]ATH [facility] ED to Hosp-Admission (Discharged) from 01/02/2021 in [facility]  C-SSRS RISK CATEGORY Error: Q3, 4, or 5 should not be populated when Q2 is No No Risk No Risk        Assessment and Plan:  Continue partial hospitalization programming Initiated Trazdone  25 mg -  '50mg'$  nightly PRN  Collaboration of Care: Collaboration of Care: Medication Management AEB medication sent to [facility]  Patient/Guardian was advised Release of Information must be obtained prior to any record release in order to collaborate their care with an outside provider. Patient/Guardian was advised if they have not already done so to contact the registration department to sign all necessary forms in order for [place] to release information regarding their care.   Consent: Patient/Guardian gives verbal consent for treatment and assignment of benefits for services provided during this visit. Patient/Guardian expressed understanding and agreed to proceed.    Derrill Center, NP 09/10/2021, 3:46 PM

## 2021-09-05 NOTE — Therapy (Signed)
[place], [place], [address] Phone: [phone]   Fax:  [phone]  Occupational Therapy Treatment Virtual Visit via Video Note  I connected with Erika [name] on 09/05/21 at  8:00 AM EDT by a video enabled telemedicine application and verified that I am speaking with the correct person using two identifiers.  Location: Patient: home Provider: office   I discussed the limitations of evaluation and management by telemedicine and the availability of in person appointments. The patient expressed understanding and agreed to proceed.    The patient was advised to call back or seek an in-person evaluation if the symptoms worsen or if the condition fails to improve as anticipated.  I provided 60 minutes of non-face-to-face time during this encounter.   Patient Details  Name: Erika [name] MRN: [id number] Date of Birth: 03-Oct-1955 No data recorded  Encounter Date: 09/05/2021   OT End of Session - 09/05/21 1549     Visit Number 5    Number of Visits 20    Date for OT Re-Evaluation 09/29/21    Authorization Type MCR 80/20%   [employer] N 1/1-12/31/2023   Copay 50.00 No visit limit follows MCR guidelines   And set for automatic crossover.   S/w Pernell Dupre [id number]    OT Start Time 1100    OT Stop Time 1200    OT Time Calculation (min) 60 min    Equipment Utilized During Treatment webex / power point    Activity Tolerance Patient tolerated treatment well    Behavior During Therapy WFL for tasks assessed/performed             Past Medical History:  Diagnosis Date   Anxiety    Arthritis    Bell's palsy    COPD (chronic obstructive pulmonary disease) ([place])    History of cardiac murmur as a child    Hyperlipidemia    Hypertension    Menopausal syndrome    Mild asthma    no inhalers since stopped smoking   Myocardial infarction ([place])    2020--pt states no damage   Neuroma of foot    OSA  on CPAP    Wears glasses     Past Surgical History:  Procedure Laterality Date   CORONARY STENT INTERVENTION N/A 07/14/2021   Procedure: CORONARY STENT INTERVENTION;  Surgeon: [name], MD;  Location: [facility];  Service: Cardiovascular;  Laterality: N/A;   ELBOW SURGERY Right 2002   repair tendon and nerve injury   EXCISION MORTON'S NEUROMA Left 2010   INTRAVASCULAR IMAGING/OCT N/A 07/14/2021   Procedure: INTRAVASCULAR IMAGING/OCT;  Surgeon: [name], MD;  Location: [facility];  Service: Cardiovascular;  Laterality: N/A;   INTRAVASCULAR PRESSURE WIRE/FFR STUDY N/A 07/14/2021   Procedure: INTRAVASCULAR PRESSURE WIRE/FFR STUDY;  Surgeon: [name], MD;  Location: [facility];  Service: Cardiovascular;  Laterality: N/A;   LEFT HEART CATH AND CORONARY ANGIOGRAPHY N/A 07/14/2021   Procedure: LEFT HEART CATH AND CORONARY ANGIOGRAPHY;  Surgeon: [name], MD;  Location: [place] [facility];  Service: Cardiovascular;  Laterality: N/A;   ROBOTIC ASSISTED TOTAL HYSTERECTOMY WITH BILATERAL SALPINGO OOPHERECTOMY N/A 12/31/2017   Procedure: XI ROBOTIC ASSISTED TOTAL HYSTERECTOMY WITH BILATERAL SALPINGO OOPHORECTOMY;  Surgeon: Everitt Amber, MD;  [facility];  Service: Gynecology;  Laterality: N/A;   SHOULDER ARTHROSCOPY W/ ROTATOR CUFF REPAIR Left 09/2016   and burectomy  There were no vitals filed for this visit.   Subjective Assessment - 09/05/21 1549     Currently in Pain? No/denies    Pain Score 0-No pain    Multiple Pain Sites No               Group Session:  S: Looking forward to learning more about forming goals to use w/ my [employer] contract"  O: In today's occupational therapy group, the topic of systems and goals was covered. The objective of the group was to educate patients on the importance of setting and achieving goals in their daily lives, and how utilizing systems can aid in this process. The therapist led a  discussion on the benefits of having a routine, breaking tasks down into smaller, achievable goals, and prioritizing tasks based on importance. The therapist also provided examples of different systems that can be used to aid in goal setting and task management, such as utilizing a planner or task list app.  It was emphasized that having a sense of accomplishment and control over one's daily tasks can improve mood and overall mental health. Patients were encouraged to try implementing these strategies in their daily lives and to share their experiences and successes with the group during future sessions.  A:  Based on today's group, patient may benefit from further assistance in implementing these strategies and utilizing systems to aid in his goal setting and task management.  P: Continue to attend PHP OT group sessions 5x week for 4 weeks to promote daily structure, social engagement, and opportunities to develop and utilize adaptive strategies to maximize functional performance in preparation for safe transition and integration back into school, work, and the community. Plan to address topic of Systems and Goals 2/2 in next OT group session.                    OT Education - 09/05/21 1549     Education Details Systems and Goals 1/2    Person(s) Educated Patient    Methods Explanation;Demonstration;Handout    Comprehension Need further instruction              OT Short Term Goals - 08/30/21 1903       OT SHORT TERM GOAL #1   Title Pt will demonstrate independence in cpoing skills in order to perform and engage in improved relationships and community reentry    Time 4    Period Weeks    Status New    Target Date 09/29/21      OT SHORT TERM GOAL #2   Title pt will demonstrate independence with newly learned time mgmt skills to better and independently employ successful routines and structure at home and w/in community re-entry.                       Plan - 09/05/21 1550     Psychosocial Skills Coping Strategies;Habits;Routines and Behaviors;Interpersonal Interaction             Patient will benefit from skilled therapeutic intervention in order to improve the following deficits and impairments:       Psychosocial Skills: Coping Strategies, Habits, Routines and Behaviors, Interpersonal Interaction   Visit Diagnosis: Difficulty coping    Problem List Patient Active Problem List   Diagnosis Date Noted   MDD (major depressive disorder), recurrent episode, severe ([place]) 08/24/2021   Hypertensive heart disease without heart failure 08/02/2021   Coronary artery disease involving native coronary  artery of native heart without angina pectoris 08/02/2021   OSA on CPAP 08/02/2021   CAD (coronary artery disease) 07/14/2021   Hypervolemia 07/14/2021   Angina pectoris ([place])    Facial droop 01/02/2021   Dysphagia 01/02/2021   Hypertension 02/18/2019   Hyperlipidemia 11/18/2018   Congestive heart failure of unknown etiology ([place]) 10/09/2018   Vitamin D deficiency 08/19/2018   Hypothyroid 08/01/2018   B12 deficiency 08/01/2018   Bilateral ovarian cysts 12/31/2017   Routine general medical examination at a health care facility 06/14/2014   Cough 05/04/2013   Fatigue 01/14/2012   Skin tag 08/23/2011   Obstructive sleep apnea 11/30/2008   Anxiety state 01/15/2008   DEPRESSIVE DSORDER, RCR, FULL REMISSION 11/28/2006   BENIGN POSITIONAL VERTIGO 11/21/2006   ALLERGIC RHINITIS 11/21/2006    Brantley Stage, OT 09/05/2021, 3:50 PM  Cornell Barman, OT   [facility] HOSPITALIZATION PROGRAM [place], [place], [address] Phone: [phone]   Fax:  [phone]  Name: Erika [name] MRN: [id number] Date of Birth: [birth year]

## 2021-09-06 ENCOUNTER — Other Ambulatory Visit (HOSPITAL_COMMUNITY): Payer: Medicare Other

## 2021-09-06 ENCOUNTER — Other Ambulatory Visit (HOSPITAL_COMMUNITY): Payer: Medicare Other | Admitting: Licensed Clinical Social Worker

## 2021-09-06 ENCOUNTER — Encounter (HOSPITAL_COMMUNITY): Payer: Self-pay

## 2021-09-06 DIAGNOSIS — F332 Major depressive disorder, recurrent severe without psychotic features: Secondary | ICD-10-CM | POA: Diagnosis not present

## 2021-09-06 DIAGNOSIS — R4589 Other symptoms and signs involving emotional state: Secondary | ICD-10-CM | POA: Diagnosis not present

## 2021-09-06 DIAGNOSIS — F418 Other specified anxiety disorders: Secondary | ICD-10-CM | POA: Diagnosis not present

## 2021-09-06 NOTE — Therapy (Signed)
[place], [place], [address] Phone: [phone]   Fax:  [phone]  Occupational Therapy Treatment Virtual Visit via Video Note  I connected with Erika [name] on 09/06/21 at  8:00 AM EDT by a video enabled telemedicine application and verified that I am speaking with the correct person using two identifiers.  Location: Patient: home Provider: office   I discussed the limitations of evaluation and management by telemedicine and the availability of in person appointments. The patient expressed understanding and agreed to proceed.    The patient was advised to call back or seek an in-person evaluation if the symptoms worsen or if the condition fails to improve as anticipated.  I provided 50 minutes of non-face-to-face time during this encounter.   Patient Details  Name: Erika [name] MRN: [id number] Date of Birth: 1955/11/11 No data recorded  Encounter Date: 09/06/2021   OT End of Session - 09/06/21 1557     Visit Number 6    Number of Visits 20    Date for OT Re-Evaluation 09/29/21    Authorization Type MCR 80/20%   [employer] N 1/1-12/31/2023   Copay 50.00 No visit limit follows MCR guidelines   And set for automatic crossover.   S/w Pernell Dupre [id number]    OT Start Time 1200    OT Stop Time 1250    OT Time Calculation (min) 50 min    Equipment Utilized During Treatment webex / power point    Activity Tolerance Patient tolerated treatment well    Behavior During Therapy WFL for tasks assessed/performed             Past Medical History:  Diagnosis Date   Anxiety    Arthritis    Bell's palsy    COPD (chronic obstructive pulmonary disease) ([place])    History of cardiac murmur as a child    Hyperlipidemia    Hypertension    Menopausal syndrome    Mild asthma    no inhalers since stopped smoking   Myocardial infarction ([place])    2020--pt states no damage   Neuroma of foot    OSA  on CPAP    Wears glasses     Past Surgical History:  Procedure Laterality Date   CORONARY STENT INTERVENTION N/A 07/14/2021   Procedure: CORONARY STENT INTERVENTION;  Surgeon: [name], MD;  Location: [facility];  Service: Cardiovascular;  Laterality: N/A;   ELBOW SURGERY Right 2002   repair tendon and nerve injury   EXCISION MORTON'S NEUROMA Left 2010   INTRAVASCULAR IMAGING/OCT N/A 07/14/2021   Procedure: INTRAVASCULAR IMAGING/OCT;  Surgeon: [name], MD;  Location: [place] [facility];  Service: Cardiovascular;  Laterality: N/A;   INTRAVASCULAR PRESSURE WIRE/FFR STUDY N/A 07/14/2021   Procedure: INTRAVASCULAR PRESSURE WIRE/FFR STUDY;  Surgeon: [name], MD;  Location: [facility];  Service: Cardiovascular;  Laterality: N/A;   LEFT HEART CATH AND CORONARY ANGIOGRAPHY N/A 07/14/2021   Procedure: LEFT HEART CATH AND CORONARY ANGIOGRAPHY;  Surgeon: [name], MD;  Location: [facility];  Service: Cardiovascular;  Laterality: N/A;   ROBOTIC ASSISTED TOTAL HYSTERECTOMY WITH BILATERAL SALPINGO OOPHERECTOMY N/A 12/31/2017   Procedure: XI ROBOTIC ASSISTED TOTAL HYSTERECTOMY WITH BILATERAL SALPINGO OOPHORECTOMY;  Surgeon: Everitt Amber, MD;  [facility];  Service: Gynecology;  Laterality: N/A;   SHOULDER ARTHROSCOPY W/ ROTATOR CUFF REPAIR Left 09/2016   and burectomy  There were no vitals filed for this visit.   Subjective Assessment - 09/06/21 1556     Currently in Pain? No/denies    Pain Score 0-No pain    Multiple Pain Sites No             Group Session:  S: "Feeling better about the things I have learned in here, especially about how to think about goals and the systems that can support them"   O: In today's occupational therapy group, the topic of systems and goals was covered. The objective of the group was to educate patients on the importance of setting and achieving goals in their daily lives, and how utilizing systems can  aid in this process. The therapist led a discussion on the benefits of having a routine, breaking tasks down into smaller, achievable goals, and prioritizing tasks based on importance. The therapist also provided examples of different systems that can be used to aid in goal setting and task management, such as utilizing a planner or task list app.  It was emphasized that having a sense of accomplishment and control over one's daily tasks can improve mood and overall mental health. Patients were encouraged to try implementing these strategies in their daily lives and to share their experiences and successes with the group during future sessions.  A: Based on today's group, Erika [name] may benefit from further assistance in implementing these strategies and utilizing systems to aid in his goal setting and task management.  P: Continue to attend PHP OT group sessions 5x week for 4 weeks to promote daily structure, social engagement, and opportunities to develop and utilize adaptive strategies to maximize functional performance in preparation for safe transition and integration back into school, work, and the community. Plan to address topic of tbd in next OT group session.                      OT Education - 09/06/21 1556     Education Details Systems and Goals 2/2    Person(s) Educated Patient    Methods Explanation;Demonstration;Handout    Comprehension Need further instruction              OT Short Term Goals - 08/30/21 1903       OT SHORT TERM GOAL #1   Title Pt will demonstrate independence in cpoing skills in order to perform and engage in improved relationships and community reentry    Time 4    Period Weeks    Status New    Target Date 09/29/21      OT SHORT TERM GOAL #2   Title pt will demonstrate independence with newly learned time mgmt skills to better and independently employ successful routines and structure at home and w/in community re-entry.                       Plan - 09/06/21 1558     Psychosocial Skills Coping Strategies;Habits;Routines and Behaviors;Interpersonal Interaction             Patient will benefit from skilled therapeutic intervention in order to improve the following deficits and impairments:       Psychosocial Skills: Coping Strategies, Habits, Routines and Behaviors, Interpersonal Interaction   Visit Diagnosis: Difficulty coping    Problem List Patient Active Problem List   Diagnosis Date Noted   MDD (major depressive disorder), recurrent episode, severe ([place]) 08/24/2021   Hypertensive heart disease without heart failure 08/02/2021  Coronary artery disease involving native coronary artery of native heart without angina pectoris 08/02/2021   OSA on CPAP 08/02/2021   CAD (coronary artery disease) 07/14/2021   Hypervolemia 07/14/2021   Angina pectoris ([place])    Facial droop 01/02/2021   Dysphagia 01/02/2021   Hypertension 02/18/2019   Hyperlipidemia 11/18/2018   Congestive heart failure of unknown etiology ([place]) 10/09/2018   Vitamin D deficiency 08/19/2018   Hypothyroid 08/01/2018   B12 deficiency 08/01/2018   Bilateral ovarian cysts 12/31/2017   Routine general medical examination at a health care facility 06/14/2014   Cough 05/04/2013   Fatigue 01/14/2012   Skin tag 08/23/2011   Obstructive sleep apnea 11/30/2008   Anxiety state 01/15/2008   DEPRESSIVE DSORDER, RCR, FULL REMISSION 11/28/2006   BENIGN POSITIONAL VERTIGO 11/21/2006   ALLERGIC RHINITIS 11/21/2006    Brantley Stage, OT 09/06/2021, 3:58 PM  Cornell Barman, OT   [facility] HOSPITALIZATION PROGRAM [place], [place], [address] Phone: [phone]   Fax:  [phone]  Name: Erika [name] MRN: [id number] Date of Birth: 09/13/1955

## 2021-09-06 NOTE — Progress Notes (Signed)
Spoke with patient via Webex video call, used 2 identifiers to correctly identify patient. States that groups are going well. Had poor sleep last night. Has more anxiety because she is leaving for the beach this weekend and she has to pack. Excited to go but not for packing. We discussed making lists during the day to help when its time to pack. She would like to discuss some weight loss options with NP. Asking about a medication called "GoLo." Message sent to NP. On scale 1-10 as 10 being worst she rates depression at 5 and anxiety at 7. Denies SI/HI or AV hallucinations. No issues or complaints.

## 2021-09-07 ENCOUNTER — Other Ambulatory Visit (HOSPITAL_COMMUNITY): Payer: Medicare Other | Admitting: Licensed Clinical Social Worker

## 2021-09-07 ENCOUNTER — Other Ambulatory Visit (HOSPITAL_COMMUNITY): Payer: Medicare Other

## 2021-09-07 ENCOUNTER — Encounter (HOSPITAL_COMMUNITY): Payer: Self-pay

## 2021-09-07 DIAGNOSIS — F332 Major depressive disorder, recurrent severe without psychotic features: Secondary | ICD-10-CM

## 2021-09-07 DIAGNOSIS — R4589 Other symptoms and signs involving emotional state: Secondary | ICD-10-CM

## 2021-09-07 DIAGNOSIS — F418 Other specified anxiety disorders: Secondary | ICD-10-CM | POA: Diagnosis not present

## 2021-09-07 NOTE — Therapy (Signed)
[place], [place], [address] Phone: [phone]   Fax:  [phone]  Occupational Therapy Treatment  Virtual Visit via Video Note  I connected with Erika [name] on 09/07/21 at  8:00 AM EDT by a video enabled telemedicine application and verified that I am speaking with the correct person using two identifiers.  Location: Patient: home Provider: office   I discussed the limitations of evaluation and management by telemedicine and the availability of in person appointments. The patient expressed understanding and agreed to proceed.    The patient was advised to call back or seek an in-person evaluation if the symptoms worsen or if the condition fails to improve as anticipated.  I provided 55 minutes of non-face-to-face time during this encounter.   Patient Details  Name: Erika [name] MRN: [id number] Date of Birth: 26-Dec-1955 No data recorded  Encounter Date: 09/07/2021   OT End of Session - 09/07/21 1545     Visit Number 7    Number of Visits 20    Date for OT Re-Evaluation 09/29/21    Authorization Type MCR 80/20%   [employer] N 1/1-12/31/2023   Copay 50.00 No visit limit follows MCR guidelines   And set for automatic crossover.   S/w Pernell Dupre [id number]    OT Start Time 1200    OT Stop Time 1255    OT Time Calculation (min) 55 min    Equipment Utilized During Treatment webex / power point    Activity Tolerance Patient tolerated treatment well    Behavior During Therapy WFL for tasks assessed/performed             Past Medical History:  Diagnosis Date   Anxiety    Arthritis    Bell's palsy    COPD (chronic obstructive pulmonary disease) ([place])    History of cardiac murmur as a child    Hyperlipidemia    Hypertension    Menopausal syndrome    Mild asthma    no inhalers since stopped smoking   Myocardial infarction ([place])    2020--pt states no damage   Neuroma of foot     OSA on CPAP    Wears glasses     Past Surgical History:  Procedure Laterality Date   CORONARY STENT INTERVENTION N/A 07/14/2021   Procedure: CORONARY STENT INTERVENTION;  Surgeon: [name], MD;  Location: [facility];  Service: Cardiovascular;  Laterality: N/A;   ELBOW SURGERY Right 2002   repair tendon and nerve injury   EXCISION MORTON'S NEUROMA Left 2010   INTRAVASCULAR IMAGING/OCT N/A 07/14/2021   Procedure: INTRAVASCULAR IMAGING/OCT;  Surgeon: [name], MD;  Location: [place] [facility];  Service: Cardiovascular;  Laterality: N/A;   INTRAVASCULAR PRESSURE WIRE/FFR STUDY N/A 07/14/2021   Procedure: INTRAVASCULAR PRESSURE WIRE/FFR STUDY;  Surgeon: [name], MD;  Location: [facility];  Service: Cardiovascular;  Laterality: N/A;   LEFT HEART CATH AND CORONARY ANGIOGRAPHY N/A 07/14/2021   Procedure: LEFT HEART CATH AND CORONARY ANGIOGRAPHY;  Surgeon: [name], MD;  Location: [facility];  Service: Cardiovascular;  Laterality: N/A;   ROBOTIC ASSISTED TOTAL HYSTERECTOMY WITH BILATERAL SALPINGO OOPHERECTOMY N/A 12/31/2017   Procedure: XI ROBOTIC ASSISTED TOTAL HYSTERECTOMY WITH BILATERAL SALPINGO OOPHORECTOMY;  Surgeon: Everitt Amber, MD;  [facility];  Service: Gynecology;  Laterality: N/A;   SHOULDER ARTHROSCOPY W/ ROTATOR CUFF REPAIR Left 09/2016   and burectomy  There were no vitals filed for this visit.   Subjective Assessment - 09/07/21 1544     Currently in Pain? No/denies    Pain Score 0-No pain    Multiple Pain Sites No              Group Session:  S: "I do still struggle with getting quality sleep, however, I have been able to use some of the new things I have learned and gained from these groups to improve certain areas of my life."  O: The patient reported struggling with quality sleep and various examples regarding difficulties with performing basic self-care activities due to their major depression during the  telehealth session. The occupational therapist provided education on the impact of depression on self-care and discussed various specific strategies to improve self-care.   The therapist explained how using a shower chair or bath bench could help the patient conserve energy during showering. The therapist also explained the benefits of establishing a bedtime routine, including taking a warm bath, reading a book, or practicing relaxation techniques to improve the patient's overall sleep hygiene. Additionally, the therapist discussed how using a pill organizer could help the patient remember to take their medication as prescribed, and how wearing comfortable clothing could provide a sense of well-being and improve motivation for self-care activities. The therapist discussed breaking down tasks into smaller steps to make them more manageable and increase motivation, along with using positive self-talk to encourage the patient's ability to engage in self-care activities.   The therapist recommended listening to calming or uplifting music and using scented products such as candles or essential oils to promote relaxation. Finally, the therapist provided education on mindfulness techniques such as deep breathing, meditation, or yoga, and discussed the importance of establishing social support.  A: The patient demonstrated understanding of the specific strategies OT discussed to improve self-care and expressed interest in implementing them. The occupational therapist will continue to assess progress in implementing these strategies and provide additional support as needed in future telehealth sessions. The therapist will also continue to address the impact of depression on self-care and explore any additional barriers that may be hindering the patient's ability to engage in self-care activities going forward.  P: Continue to attend PHP OT group sessions 5x week for 4 weeks to promote daily structure, social  engagement, and opportunities to develop and utilize adaptive strategies to maximize functional performance in preparation for safe transition and integration back into school, work, and the community. Plan to address topic of ADLs and Depression in next OT group session.                     OT Education - 09/07/21 1544     Education Details ADLs and Depression 1/2    Person(s) Educated Patient    Methods Explanation;Demonstration;Handout    Comprehension Need further instruction              OT Short Term Goals - 08/30/21 1903       OT SHORT TERM GOAL #1   Title Pt will demonstrate independence in cpoing skills in order to perform and engage in improved relationships and community reentry    Time 4    Period Weeks    Status New    Target Date 09/29/21      OT SHORT TERM GOAL #2   Title pt will demonstrate independence with newly learned time mgmt skills to better and independently employ successful routines and structure at  home and w/in community re-entry.                      Plan - 09/07/21 1545     Psychosocial Skills Coping Strategies;Habits;Routines and Behaviors;Interpersonal Interaction             Patient will benefit from skilled therapeutic intervention in order to improve the following deficits and impairments:       Psychosocial Skills: Coping Strategies, Habits, Routines and Behaviors, Interpersonal Interaction   Visit Diagnosis: Difficulty coping    Problem List Patient Active Problem List   Diagnosis Date Noted   MDD (major depressive disorder), recurrent episode, severe ([place]) 08/24/2021   Hypertensive heart disease without heart failure 08/02/2021   Coronary artery disease involving native coronary artery of native heart without angina pectoris 08/02/2021   OSA on CPAP 08/02/2021   CAD (coronary artery disease) 07/14/2021   Hypervolemia 07/14/2021   Angina pectoris ([place])    Facial droop 01/02/2021    Dysphagia 01/02/2021   Hypertension 02/18/2019   Hyperlipidemia 11/18/2018   Congestive heart failure of unknown etiology ([place]) 10/09/2018   Vitamin D deficiency 08/19/2018   Hypothyroid 08/01/2018   B12 deficiency 08/01/2018   Bilateral ovarian cysts 12/31/2017   Routine general medical examination at a health care facility 06/14/2014   Cough 05/04/2013   Fatigue 01/14/2012   Skin tag 08/23/2011   Obstructive sleep apnea 11/30/2008   Anxiety state 01/15/2008   DEPRESSIVE DSORDER, RCR, FULL REMISSION 11/28/2006   BENIGN POSITIONAL VERTIGO 11/21/2006   ALLERGIC RHINITIS 11/21/2006    Brantley Stage, OT 09/07/2021, 3:46 PM  Cornell Barman, OT   [facility] HOSPITALIZATION PROGRAM [place], [place], [address] Phone: [phone]   Fax:  [phone]  Name: Erika [name] MRN: [id number] Date of Birth: 07-29-1955

## 2021-09-08 ENCOUNTER — Telehealth (HOSPITAL_COMMUNITY): Payer: Self-pay

## 2021-09-08 ENCOUNTER — Other Ambulatory Visit (HOSPITAL_COMMUNITY): Payer: Medicare Other

## 2021-09-08 ENCOUNTER — Telehealth (HOSPITAL_COMMUNITY): Payer: Self-pay | Admitting: Licensed Clinical Social Worker

## 2021-09-08 NOTE — Telephone Encounter (Signed)
Pt insurance is active and benefits verified through Medicare a/b Co-pay 0, DED $226/$226 met, out of pocket 0/0 met, co-insurance 20%. no pre-authorization required. Passport, 09/08/2021'@12' :04pm, REF# [id number]   2ndary insurance is active and benefits verified through [employer]. Co-pay 0, DED 0/0 met, out of pocket 0/0 met, co-insurance 0%. No pre-authorization required.

## 2021-09-09 ENCOUNTER — Other Ambulatory Visit: Payer: Self-pay | Admitting: Cardiology

## 2021-09-10 ENCOUNTER — Encounter (HOSPITAL_COMMUNITY): Payer: Self-pay | Admitting: Family

## 2021-09-12 ENCOUNTER — Encounter (HOSPITAL_COMMUNITY): Payer: Self-pay

## 2021-09-12 ENCOUNTER — Other Ambulatory Visit (HOSPITAL_COMMUNITY): Payer: Medicare Other | Admitting: Licensed Clinical Social Worker

## 2021-09-12 ENCOUNTER — Other Ambulatory Visit (HOSPITAL_COMMUNITY): Payer: Medicare Other

## 2021-09-12 ENCOUNTER — Encounter (HOSPITAL_COMMUNITY): Payer: Self-pay | Admitting: Family

## 2021-09-12 DIAGNOSIS — F332 Major depressive disorder, recurrent severe without psychotic features: Secondary | ICD-10-CM

## 2021-09-12 DIAGNOSIS — R4589 Other symptoms and signs involving emotional state: Secondary | ICD-10-CM | POA: Diagnosis not present

## 2021-09-12 DIAGNOSIS — F418 Other specified anxiety disorders: Secondary | ICD-10-CM | POA: Diagnosis not present

## 2021-09-12 NOTE — Therapy (Signed)
[facility] [place], [place], [address] Phone: [phone]   Fax:  [phone]  Occupational Therapy Treatment  Virtual Visit via Video Note  I connected with Erika [name] on 09/12/21 at  8:00 AM EDT by a video enabled telemedicine application and verified that I am speaking with the correct person using two identifiers.  Location: Patient: home Provider: office   I discussed the limitations of evaluation and management by telemedicine and the availability of in person appointments. The patient expressed understanding and agreed to proceed.    The patient was advised to call back or seek an in-person evaluation if the symptoms worsen or if the condition fails to improve as anticipated.  I provided 60 minutes of non-face-to-face time during this encounter.   Patient Details  Name: Erika [name] MRN: [id number] Date of Birth: 1956/04/07 No data recorded  Encounter Date: 09/12/2021   OT End of Session - 09/12/21 1249     Visit Number 8    Number of Visits 20    Date for OT Re-Evaluation 09/29/21    Authorization Type MCR 80/20%   [employer] N 1/1-12/31/2023   Copay 50.00 No visit limit follows MCR guidelines   And set for automatic crossover.   S/w Pernell Dupre [id number]    OT Start Time 1100    OT Stop Time 1200    OT Time Calculation (min) 60 min    Equipment Utilized During Treatment webex / power point    Activity Tolerance Patient tolerated treatment well    Behavior During Therapy WFL for tasks assessed/performed             Past Medical History:  Diagnosis Date   Anxiety    Arthritis    Bell's palsy    COPD (chronic obstructive pulmonary disease) ([place])    History of cardiac murmur as a child    Hyperlipidemia    Hypertension    Menopausal syndrome    Mild asthma    no inhalers since stopped smoking   Myocardial infarction ([place])    2020--pt states no damage   Neuroma of foot     OSA on CPAP    Wears glasses     Past Surgical History:  Procedure Laterality Date   CORONARY STENT INTERVENTION N/A 07/14/2021   Procedure: CORONARY STENT INTERVENTION;  Surgeon: [name], MD;  Location: [facility];  Service: Cardiovascular;  Laterality: N/A;   ELBOW SURGERY Right 2002   repair tendon and nerve injury   EXCISION MORTON'S NEUROMA Left 2010   INTRAVASCULAR IMAGING/OCT N/A 07/14/2021   Procedure: INTRAVASCULAR IMAGING/OCT;  Surgeon: [name], MD;  Location: [facility];  Service: Cardiovascular;  Laterality: N/A;   INTRAVASCULAR PRESSURE WIRE/FFR STUDY N/A 07/14/2021   Procedure: INTRAVASCULAR PRESSURE WIRE/FFR STUDY;  Surgeon: [name], MD;  Location: [facility];  Service: Cardiovascular;  Laterality: N/A;   LEFT HEART CATH AND CORONARY ANGIOGRAPHY N/A 07/14/2021   Procedure: LEFT HEART CATH AND CORONARY ANGIOGRAPHY;  Surgeon: [name], MD;  Location: [facility];  Service: Cardiovascular;  Laterality: N/A;   ROBOTIC ASSISTED TOTAL HYSTERECTOMY WITH BILATERAL SALPINGO OOPHERECTOMY N/A 12/31/2017   Procedure: XI ROBOTIC ASSISTED TOTAL HYSTERECTOMY WITH BILATERAL SALPINGO OOPHORECTOMY;  Surgeon: Everitt Amber, MD;  [facility];  Service: Gynecology;  Laterality: N/A;   SHOULDER ARTHROSCOPY W/ ROTATOR CUFF REPAIR Left 09/2016   and burectomy  There were no vitals filed for this visit.   Subjective Assessment - 09/12/21 1249     Currently in Pain? No/denies    Pain Score 0-No pain              Group Session:  S: "Feeling better, Went to the beach this weekend, so that was nice"  O: The patient, a 66 year old female diagnosed with major depression, reported during the telehealth session struggling to perform basic self-care tasks, including bathing, dressing, and taking medication. The occupational therapist provided education on the impact of depression on self-care and discussed specific strategies to  improve self-care.   The therapist explained how using a shower chair or bath bench could help the patient conserve energy during showering, and how to obtain and use such equipment. The therapist also provided a sample bedtime routine, including taking a warm bath, reading a book, or practicing relaxation techniques, and discussed how this routine could improve the patient's overall sleep hygiene. Additionally, the therapist demonstrated how to use a pill organizer and discussed the importance of wearing comfortable clothing.   The therapist also discussed breaking down tasks into smaller steps to make them more manageable and increase motivation, and provided examples of how to do so. The therapist provided education on using positive self-talk to encourage the patient's ability to engage in self-care activities, and recommended listening to calming or uplifting music and using scented products such as candles or essential oils to promote relaxation.   Finally, the therapist provided education on mindfulness techniques such as deep breathing, meditation, or yoga, and discussed the importance of establishing social support, including exploring options for support groups or counseling.  OT also encouraged the use of a Self Care Contract which was shared w/ her in a PDF form.   A: The patient demonstrated a fair understanding of the strategies discussed to improve self-care and expressed interest in implementing them. The occupational therapist will continue to assess progress in implementing these strategies and provide additional support as needed in future telehealth sessions. The therapist will also continue to address the impact of depression on self-care and explore any additional barriers that may be hindering the patient's ability to engage in self-care activities.  P: Continue to attend PHP OT group sessions 5x week for 4 weeks to promote daily structure, social engagement, and opportunities to  develop and utilize adaptive strategies to maximize functional performance in preparation for safe transition and integration back into school, work, and the community. Plan to address topic of tbd in next OT group session.                     OT Education - 09/12/21 1249     Education Details ADLs and Depression 2/2    Person(s) Educated Patient    Methods Explanation;Demonstration;Handout    Comprehension Need further instruction              OT Short Term Goals - 08/30/21 1903       OT SHORT TERM GOAL #1   Title Pt will demonstrate independence in cpoing skills in order to perform and engage in improved relationships and community reentry    Time 4    Period Weeks    Status New    Target Date 09/29/21      OT SHORT TERM GOAL #2   Title pt will demonstrate independence with newly learned time mgmt skills to better and independently employ successful routines and structure at home and  w/in community re-entry.                      Plan - 09/12/21 1250     Psychosocial Skills Coping Strategies;Habits;Routines and Behaviors;Interpersonal Interaction             Patient will benefit from skilled therapeutic intervention in order to improve the following deficits and impairments:       Psychosocial Skills: Coping Strategies, Habits, Routines and Behaviors, Interpersonal Interaction   Visit Diagnosis: Difficulty coping    Problem List Patient Active Problem List   Diagnosis Date Noted   MDD (major depressive disorder), recurrent episode, severe ([place]) 08/24/2021   Hypertensive heart disease without heart failure 08/02/2021   Coronary artery disease involving native coronary artery of native heart without angina pectoris 08/02/2021   OSA on CPAP 08/02/2021   CAD (coronary artery disease) 07/14/2021   Hypervolemia 07/14/2021   Angina pectoris ([place])    Facial droop 01/02/2021   Dysphagia 01/02/2021   Hypertension 02/18/2019    Hyperlipidemia 11/18/2018   Congestive heart failure of unknown etiology ([place]) 10/09/2018   Vitamin D deficiency 08/19/2018   Hypothyroid 08/01/2018   B12 deficiency 08/01/2018   Bilateral ovarian cysts 12/31/2017   Routine general medical examination at a health care facility 06/14/2014   Cough 05/04/2013   Fatigue 01/14/2012   Skin tag 08/23/2011   Obstructive sleep apnea 11/30/2008   Anxiety state 01/15/2008   DEPRESSIVE DSORDER, RCR, FULL REMISSION 11/28/2006   BENIGN POSITIONAL VERTIGO 11/21/2006   ALLERGIC RHINITIS 11/21/2006    Brantley Stage, OT 09/12/2021, 12:51 PM  Cornell Barman, OT   [facility] HOSPITALIZATION PROGRAM [place], [place], [address] Phone: [phone]   Fax:  [phone]  Name: Erika [name] MRN: [id number] Date of Birth: 1956-04-16

## 2021-09-12 NOTE — Progress Notes (Signed)
Spoke with patient via Webex video call, used 2 identifiers to correctly identify patient. States that groups are going good. She feels they are helping a lot. Her sleeping medication is working well and she is feeling more rested with energy. She has a plan to start walking in hopes of losing weight. She has her grandson staying with her this week and she is enjoying that. On scale 1-10 as 10 being worst she rates depression at 7 and anxiety at 0. Denies SI/HI or AV hallucinations. No issues or complaints.

## 2021-09-12 NOTE — Progress Notes (Signed)
Virtual Visit via Video Note  I connected with Erika [name] on 09/12/21 at  9:00 AM EDT by a video enabled telemedicine application and verified that I am speaking with the correct person using two identifiers.  Location: Patient: Home Provider: Office   I discussed the limitations of evaluation and management by telemedicine and the availability of in person appointments. The patient expressed understanding and agreed to proceed.    I discussed the assessment and treatment plan with the patient. The patient was provided an opportunity to ask questions and all were answered. The patient agreed with the plan and demonstrated an understanding of the instructions.   The patient was advised to call back or seek an in-person evaluation if the symptoms worsen or if the condition fails to improve as anticipated.  I provided 15 minutes of non-face-to-face time during this encounter.   Derrill Center, NP   [facility] MD/PA/NP OP Progress Note  09/12/2021 11:01 AM VELINDA WROBEL  MRN:  [id number]  Chief Complaint: " I am feeling good and it is good to know that I am not alone."   HPI:  Erika [name] was seen and evaluated via WebEx.  She reports feeling better overall.  States group has been helpful due to different support avenues.  States she has learned different coping mechanisms.    She denies any suicidal or homicidal ideations.  Denies auditory visual hallucinations.  Patient was initiated on low-dose trazodone which she reports has been helpful.    States she has been getting plenty of rest over the past 2-3 nights.  Reports a good appetite.  "  Over eating at time."  patient inquired about weight loss medication as advertised on television.  She was encouraged to follow-up with primary care provider for additional guidance.    Rates her depression 6 out of 10 with 10 being the worst.  Denies any anxiety or anxiety symptoms.  Patient to continue partial hospitalization programming.  Support  encouragement reassurance was provided.    Visit Diagnosis:    ICD-10-CM   1. Severe episode of recurrent major depressive disorder, without psychotic features ([place])  F33.2     2. Difficulty coping  R45.89       Past Psychiatric History:   Past Medical History:  Past Medical History:  Diagnosis Date   Anxiety    Arthritis    Bell's palsy    COPD (chronic obstructive pulmonary disease) ([place])    History of cardiac murmur as a child    Hyperlipidemia    Hypertension    Menopausal syndrome    Mild asthma    no inhalers since stopped smoking   Myocardial infarction ([place])    2020--pt states no damage   Neuroma of foot    OSA on CPAP    Wears glasses     Past Surgical History:  Procedure Laterality Date   CORONARY STENT INTERVENTION N/A 07/14/2021   Procedure: CORONARY STENT INTERVENTION;  Surgeon: [name], MD;  Location: [facility];  Service: Cardiovascular;  Laterality: N/A;   ELBOW SURGERY Right 2002   repair tendon and nerve injury   EXCISION MORTON'S NEUROMA Left 2010   INTRAVASCULAR IMAGING/OCT N/A 07/14/2021   Procedure: INTRAVASCULAR IMAGING/OCT;  Surgeon: [name], MD;  Location: [facility];  Service: Cardiovascular;  Laterality: N/A;   INTRAVASCULAR PRESSURE WIRE/FFR STUDY N/A 07/14/2021   Procedure: INTRAVASCULAR PRESSURE WIRE/FFR STUDY;  Surgeon: [name], MD;  Location: [facility];  Service: Cardiovascular;  Laterality: N/A;   LEFT HEART CATH AND CORONARY ANGIOGRAPHY N/A 07/14/2021   Procedure: LEFT HEART CATH AND CORONARY ANGIOGRAPHY;  Surgeon: [name], MD;  Location: [facility];  Service: Cardiovascular;  Laterality: N/A;   ROBOTIC ASSISTED TOTAL HYSTERECTOMY WITH BILATERAL SALPINGO OOPHERECTOMY N/A 12/31/2017   Procedure: XI ROBOTIC ASSISTED TOTAL HYSTERECTOMY WITH BILATERAL SALPINGO OOPHORECTOMY;  Surgeon: Everitt Amber, MD;  [facility];  Service: Gynecology;  Laterality: N/A;   SHOULDER  ARTHROSCOPY W/ ROTATOR CUFF REPAIR Left 09/2016   and burectomy    Family Psychiatric History:   Family History:  Family History  Problem Relation Age of Onset   Depression Mother    Diabetes Mother    Heart disease Mother    Asthma Sister    Cancer Brother    Cancer Maternal Aunt    Cancer Maternal Uncle    Uterine cancer Maternal Uncle    Diabetes Maternal Grandfather    Diabetes Maternal Grandmother    Cancer Paternal Grandmother    Colon cancer Neg Hx    Colon polyps Neg Hx    Esophageal cancer Neg Hx    Rectal cancer Neg Hx    Stomach cancer Neg Hx     Social History:  Social History   Socioeconomic History   Marital status: Married    Spouse name: Not on file   Number of children: 2   Years of education: Not on file   Highest education level: 12th grade  Occupational History   Occupation: Solicitor: [employer]  Tobacco Use   Smoking status: Former    Packs/day: 1.00    Years: 30.00    Pack years: 30.00    Types: Cigarettes    Quit date: 06/01/2000    Years since quitting: 21.2   Smokeless tobacco: Never  Vaping Use   Vaping Use: Never used  Substance and Sexual Activity   Alcohol use: No   Drug use: Never   Sexual activity: Not Currently    Birth control/protection: Post-menopausal  Other Topics Concern   Not on file  Social History Narrative   Not on file   Social Determinants of Health   Financial Resource Strain: Low Risk    Difficulty of Paying Living Expenses: Not very hard  Food Insecurity: No Food Insecurity   Worried About Running Out of Food in the Last Year: Never true   Ran Out of Food in the Last Year: Never true  Transportation Needs: No Transportation Needs   Lack of Transportation (Medical): No   Lack of Transportation (Non-Medical): No  Physical Activity: Unknown   Days of Exercise per Week: 0 days   Minutes of Exercise per Session: Not on file  Stress: Stress Concern Present   Feeling of Stress :  Very much  Social Connections: Unknown   Frequency of Communication with Friends and Family: Once a week   Frequency of Social Gatherings with Friends and Family: Patient refused   Attends Religious Services: 1 to 4 times per year   Active Member of [employer] or Organizations: Yes   Attends Archivist Meetings: 1 to 4 times per year   Marital Status: Not on file    Allergies:  Allergies  Allergen Reactions   Tape Other (See Comments)    PATIENT PREFERS CLOTH TAPE- THE OTHERS DON'T AGREE WITH HER SKIN   Tetracycline Hcl Rash    Metabolic Disorder Labs: Lab Results  Component Value Date   HGBA1C 5.7 05/05/2020   No results found for: PROLACTIN Lab Results  Component Value Date   CHOL 155 09/22/2020   TRIG 110.0 09/22/2020   HDL 61.70 09/22/2020   CHOLHDL 3 09/22/2020   VLDL 22.0 09/22/2020   LDLCALC 71 09/22/2020   LDLCALC 162 (H) 05/05/2020   Lab Results  Component Value Date   TSH 2.80 05/05/2020   TSH 1.21 04/01/2019    Therapeutic Level Labs: No results found for: LITHIUM No results found for: VALPROATE No components found for:  CBMZ  Current Medications: Current Outpatient Medications  Medication Sig Dispense Refill   acetaminophen (TYLENOL) 650 MG CR tablet Take 650-1,300 mg by mouth every 8 (eight) hours as needed for pain.     albuterol (VENTOLIN HFA) 108 (90 Base) MCG/ACT inhaler Inhale 2 puffs into the lungs every 4 (four) hours as needed for wheezing or shortness of breath. 18 g 0   aspirin EC 81 MG tablet Take 1 tablet (81 mg total) by mouth daily. Swallow whole.     atorvastatin (LIPITOR) 40 MG tablet Take 1 tablet (40 mg total) by mouth daily. (Patient taking differently: Take 40 mg by mouth every evening.) 90 tablet 1   buPROPion (WELLBUTRIN XL) 150 MG 24 hr tablet Take 1 tablet (150 mg total) by mouth daily. (Patient taking differently: Take 150 mg by mouth every evening.) 90 tablet 1   cetirizine (ZYRTEC) 10 MG tablet Take 10 mg by mouth daily  as needed for allergies.     Cholecalciferol (VITAMIN D3 SUPER STRENGTH) 50 MCG (2000 UT) CAPS Take 2,000 Units by mouth every evening.     citalopram (CELEXA) 20 MG tablet Take 1 tablet (20 mg total) by mouth at bedtime. 30 tablet 1   clopidogrel (PLAVIX) 75 MG tablet Take 1 tablet (75 mg total) by mouth daily. 90 tablet 1   cyanocobalamin (,VITAMIN B-12,) 1000 MCG/ML injection INJECT 1 ML (1,000 MCG TOTAL) INTO THE MUSCLE EVERY 30 DAYS. 6 mL 2   diphenhydramine-acetaminophen (TYLENOL PM) 25-500 MG TABS tablet Take 1 tablet by mouth at bedtime as needed (sleep). (Patient not taking: Reported on 09/06/2021)     furosemide (LASIX) 20 MG tablet Take 1 tablet (20 mg total) by mouth daily. 90 tablet 1   hydrochlorothiazide (HYDRODIURIL) 25 MG tablet Take 1 tablet (25 mg total) by mouth daily. (Patient taking differently: Take 25 mg by mouth every evening.) 90 tablet 1   hydrOXYzine (VISTARIL) 25 MG capsule Take 1 capsule (25 mg total) by mouth at bedtime and may repeat dose one time if needed. 60 capsule 0   levothyroxine (SYNTHROID) 75 MCG tablet Take 1 tablet (75 mcg total) by mouth daily before breakfast. 90 tablet 1   metoprolol succinate (TOPROL XL) 25 MG 24 hr tablet Take 0.5 tablets (12.5 mg total) by mouth daily. (Patient taking differently: Take 12.5 mg by mouth every evening.) 45 tablet 3   nitroGLYCERIN (NITROSTAT) 0.4 MG SL tablet Place 1 tablet (0.4 mg total) under the tongue every 5 (five) minutes as needed for chest pain. Up to 3 times. 90 tablet 3   polyvinyl alcohol (LIQUIFILM TEARS) 1.4 % ophthalmic solution Place 1 drop into both eyes 5 (five) times daily. (Patient taking differently: Place 1 drop into both eyes 5 (five) times daily as needed for dry eyes.) 15 mL 0   SYRINGE-NEEDLE, DISP, 3 ML (BD SAFETYGLIDE SYRINGE/NEEDLE) 25G X 1" 3 ML MISC Use for B12 injections 100 each 11  No current facility-administered medications for this visit.     Musculoskeletal: Strength & Muscle  Tone: within normal limits Gait & Station: normal Patient leans: N/A  Psychiatric Specialty Exam: Review of Systems  Constitutional: Negative.   Cardiovascular: Negative.   Musculoskeletal: Negative.   Psychiatric/Behavioral:  Positive for sleep disturbance (improving reported "restful" sleep). Negative for decreased concentration, hallucinations and suicidal ideas. The patient is nervous/anxious.   All other systems reviewed and are negative.  There were no vitals taken for this visit.There is no height or weight on file to calculate BMI.  General Appearance: Casual  Eye Contact:  Good  Speech:  Clear and Coherent  Volume:  Normal  Mood:  Anxious and Depressed  Affect:  Congruent  Thought Process:  Coherent  Orientation:  Full (Time, Place, and Person)  Thought Content: Logical   Suicidal Thoughts:  No  Homicidal Thoughts:  No  Memory:  Immediate;   Good Recent;   Good  Judgement:  Good  Insight:  Good  Psychomotor Activity:  Normal  Concentration:  Concentration: Good  Recall:  Good  Fund of Knowledge: Good  Language: Good  Akathisia:  No  Handed:  Right  AIMS (if indicated): done  Assets:  Communication Skills Desire for Improvement Resilience Social Support  ADL's:  Intact  Cognition: WNL  Sleep:  Good   Screenings: Web designer from 08/30/2021 in [facility] Visit from 08/22/2021 in [facility] from 04/11/2021 in [place] at [facility] from 11/08/2020 in [facility] at [facility] from 09/23/2020 in [place] [place] at [facility] Total Score '6 6 2 2 1  '$ PHQ-9 Total Score '22 20 13 7 '$ --      Flowsheet Row Counselor from 08/30/2021 in [facility] Admission (Discharged) from 07/14/2021 in [facility] CATH LAB ED to Hosp-Admission (Discharged) from 01/02/2021 in  [facility]  C-SSRS RISK CATEGORY Error: Q3, 4, or 5 should not be populated when Q2 is No No Risk No Risk        Assessment and Plan:  Patient to continue Partial  Hospitalization    Collaboration of Care: Collaboration of Care: Psychiatrist AEB and Therapy   Patient/Guardian was advised Release of Information must be obtained prior to any record release in order to collaborate their care with an outside provider. Patient/Guardian was advised if they have not already done so to contact the registration department to sign all necessary forms in order for [place] to release information regarding their care.   Consent: Patient/Guardian gives verbal consent for treatment and assignment of benefits for services provided during this visit. Patient/Guardian expressed understanding and agreed to proceed.    Derrill Center, NP 09/12/2021, 11:01 AM

## 2021-09-13 ENCOUNTER — Encounter (HOSPITAL_COMMUNITY): Payer: Self-pay

## 2021-09-13 ENCOUNTER — Other Ambulatory Visit (HOSPITAL_COMMUNITY): Payer: Medicare Other

## 2021-09-13 ENCOUNTER — Other Ambulatory Visit (HOSPITAL_COMMUNITY): Payer: Medicare Other | Admitting: Licensed Clinical Social Worker

## 2021-09-13 DIAGNOSIS — F332 Major depressive disorder, recurrent severe without psychotic features: Secondary | ICD-10-CM

## 2021-09-13 DIAGNOSIS — R4589 Other symptoms and signs involving emotional state: Secondary | ICD-10-CM | POA: Diagnosis not present

## 2021-09-13 DIAGNOSIS — F418 Other specified anxiety disorders: Secondary | ICD-10-CM | POA: Diagnosis not present

## 2021-09-13 NOTE — Therapy (Unsigned)
Erika [name], [place], [address] Phone: [phone]   Fax:  [phone]  Occupational Therapy Treatment Virtual Visit via Video Note  I connected with Erika [name] on 09/13/21 at  8:00 AM EDT by a video enabled telemedicine application and verified that I am speaking with the correct person using two identifiers.  Location: Patient: home Provider: office   I discussed the limitations of evaluation and management by telemedicine and the availability of in person appointments. The patient expressed understanding and agreed to proceed.    The patient was advised to call back or seek an in-person evaluation if the symptoms worsen or if the condition fails to improve as anticipated.  I provided 50 minutes of non-face-to-face time during this encounter.   Patient Details  Name: Erika [name] MRN: [id number] Date of Birth: [birth year] No data recorded  Encounter Date: 09/13/2021   OT End of Session - 09/13/21 1404     Visit Number 9    Number of Visits 20    Date for OT Re-Evaluation 09/29/21    Authorization Type MCR 80/20%   [employer] N 1/1-12/31/2023   Copay 50.00 No visit limit follows MCR guidelines   And set for automatic crossover.   S/w Pernell Dupre [id number]    OT Start Time 1200    OT Stop Time 1250    OT Time Calculation (min) 50 min    Equipment Utilized During Treatment webex / power point    Activity Tolerance Patient tolerated treatment well    Behavior During Therapy WFL for tasks assessed/performed             Past Medical History:  Diagnosis Date   Anxiety    Arthritis    Bell's palsy    COPD (chronic obstructive pulmonary disease) ([place])    History of cardiac murmur as a child    Hyperlipidemia    Hypertension    Menopausal syndrome    Mild asthma    no inhalers since stopped smoking   Myocardial infarction ([place])    2020--pt states no damage   Neuroma of foot    OSA  on CPAP    Wears glasses     Past Surgical History:  Procedure Laterality Date   CORONARY STENT INTERVENTION N/A 07/14/2021   Procedure: CORONARY STENT INTERVENTION;  Surgeon: [name], MD;  Location: [facility];  Service: Cardiovascular;  Laterality: N/A;   ELBOW SURGERY Right 2002   repair tendon and nerve injury   EXCISION MORTON'S NEUROMA Left 2010   INTRAVASCULAR IMAGING/OCT N/A 07/14/2021   Procedure: INTRAVASCULAR IMAGING/OCT;  Surgeon: [name], MD;  Location: [facility];  Service: Cardiovascular;  Laterality: N/A;   INTRAVASCULAR PRESSURE WIRE/FFR STUDY N/A 07/14/2021   Procedure: INTRAVASCULAR PRESSURE WIRE/FFR STUDY;  Surgeon: [name], MD;  Location: [facility];  Service: Cardiovascular;  Laterality: N/A;   LEFT HEART CATH AND CORONARY ANGIOGRAPHY N/A 07/14/2021   Procedure: LEFT HEART CATH AND CORONARY ANGIOGRAPHY;  Surgeon: [name], MD;  Location: [facility];  Service: Cardiovascular;  Laterality: N/A;   ROBOTIC ASSISTED TOTAL HYSTERECTOMY WITH BILATERAL SALPINGO OOPHERECTOMY N/A 12/31/2017   Procedure: XI ROBOTIC ASSISTED TOTAL HYSTERECTOMY WITH BILATERAL SALPINGO OOPHORECTOMY;  Surgeon: Everitt Amber, MD;  [facility];  Service: Gynecology;  Laterality: N/A;   SHOULDER ARTHROSCOPY W/ ROTATOR CUFF REPAIR Left 09/2016   and burectomy  There were no vitals filed for this visit.   Subjective Assessment - 09/13/21 1404     Currently in Pain? No/denies    Pain Score 0-No pain               Group Session:  S: "Feeling better today, it's just really rainy out there"  O: The therapy session was led by an occupational therapist via telehealth with eight patients in attendance. The focus of the session was on exploring the power of reciprocity and fairness to improve relationships and overall mental health and wellbeing. At the beginning of the session, the OT provided an introduction to the topic and  defined the session's objectives. The participants then engaged in a discussion about their past experiences with the concept of reciprocity and how these experiences have influenced their lives and relationships, both positively and negatively. Throughout the session, the OT emphasized the importance of reciprocal relationships and how this concept can serve as a guide for future engagements with relationships and relationship development.  A: The patient attended the therapy session on the power of reciprocity and fairness to improve relationships and overall mental health and wellbeing, but they did not actively participate in the discussion. They appeared disengaged and did not share any personal experiences related to the topic. Despite the efforts of the occupational therapist to engage them in the discussion, the patient remained passive and showed little interest in the session's objectives. It is unclear if the patient fully understood the importance of reciprocal relationships and how it can impact their mental health and wellbeing. Further assessment is needed to determine the best course of action for engaging this patient in therapy sessions  P: Continue to attend PHP OT group sessions 5x week for 4 weeks to promote daily structure, social engagement, and opportunities to develop and utilize adaptive strategies to maximize functional performance in preparation for safe transition and integration back into school, work, and the community. Plan to address topic of The Power of Reciprocity 2/2 in next OT group session.                    OT Education - 09/13/21 1404     Education Details The Power of Reciprocity 1/2    Person(s) Educated Patient    Methods Explanation;Demonstration;Handout    Comprehension Need further instruction              OT Short Term Goals - 08/30/21 1903       OT SHORT TERM GOAL #1   Title Pt will demonstrate independence in cpoing skills in  order to perform and engage in improved relationships and community reentry    Time 4    Period Weeks    Status New    Target Date 09/29/21      OT SHORT TERM GOAL #2   Title pt will demonstrate independence with newly learned time mgmt skills to better and independently employ successful routines and structure at home and w/in community re-entry.                      Plan - 09/13/21 1405     Psychosocial Skills Coping Strategies;Habits;Routines and Behaviors;Interpersonal Interaction             Patient will benefit from skilled therapeutic intervention in order to improve the following deficits and impairments:       Psychosocial Skills: Coping Strategies, Habits, Routines and Behaviors, Interpersonal Interaction   Visit Diagnosis:  Difficulty coping    Problem List Patient Active Problem List   Diagnosis Date Noted   MDD (major depressive disorder), recurrent episode, severe ([place]) 08/24/2021   Hypertensive heart disease without heart failure 08/02/2021   Coronary artery disease involving native coronary artery of native heart without angina pectoris 08/02/2021   OSA on CPAP 08/02/2021   CAD (coronary artery disease) 07/14/2021   Hypervolemia 07/14/2021   Angina pectoris ([place])    Facial droop 01/02/2021   Dysphagia 01/02/2021   Hypertension 02/18/2019   Hyperlipidemia 11/18/2018   Congestive heart failure of unknown etiology ([place]) 10/09/2018   Vitamin D deficiency 08/19/2018   Hypothyroid 08/01/2018   B12 deficiency 08/01/2018   Bilateral ovarian cysts 12/31/2017   Routine general medical examination at a health care facility 06/14/2014   Cough 05/04/2013   Fatigue 01/14/2012   Skin tag 08/23/2011   Obstructive sleep apnea 11/30/2008   Anxiety state 01/15/2008   DEPRESSIVE DSORDER, RCR, FULL REMISSION 11/28/2006   BENIGN POSITIONAL VERTIGO 11/21/2006   ALLERGIC RHINITIS 11/21/2006    Brantley Stage, OT 09/13/2021, 2:05 PM  [facility] PARTIAL HOSPITALIZATION PROGRAM [place], [place], [address] Phone: [phone]   Fax:  [phone]  Name: JACE DOWE MRN: [id number] Date of Birth: 30-Aug-1955

## 2021-09-14 ENCOUNTER — Encounter (HOSPITAL_COMMUNITY): Payer: Self-pay

## 2021-09-14 ENCOUNTER — Other Ambulatory Visit (HOSPITAL_COMMUNITY): Payer: Medicare Other | Admitting: Licensed Clinical Social Worker

## 2021-09-14 ENCOUNTER — Other Ambulatory Visit (HOSPITAL_COMMUNITY): Payer: Medicare Other | Attending: Psychiatry

## 2021-09-14 DIAGNOSIS — Z72820 Sleep deprivation: Secondary | ICD-10-CM | POA: Diagnosis not present

## 2021-09-14 DIAGNOSIS — R4589 Other symptoms and signs involving emotional state: Secondary | ICD-10-CM

## 2021-09-14 DIAGNOSIS — F419 Anxiety disorder, unspecified: Secondary | ICD-10-CM | POA: Insufficient documentation

## 2021-09-14 DIAGNOSIS — Z79899 Other long term (current) drug therapy: Secondary | ICD-10-CM | POA: Insufficient documentation

## 2021-09-14 DIAGNOSIS — F332 Major depressive disorder, recurrent severe without psychotic features: Secondary | ICD-10-CM

## 2021-09-14 NOTE — Therapy (Signed)
[place], [place], [address] Phone: [phone]   Fax:  [phone]  Occupational Therapy Treatment  Virtual Visit via Video Note  I connected with [name] on 09/14/21 at  8:00 AM EDT by a video enabled telemedicine application and verified that I am speaking with the correct person using two identifiers.  Location: Patient: home Provider: office   I discussed the limitations of evaluation and management by telemedicine and the availability of in person appointments. The patient expressed understanding and agreed to proceed.    The patient was advised to call back or seek an in-person evaluation if the symptoms worsen or if the condition fails to improve as anticipated.  I provided 60 minutes of non-face-to-face time during this encounter.   Patient Details  Name: Erika [name] MRN: [id number] Date of Birth: [birth year] No data recorded  Encounter Date: 09/14/2021   OT End of Session - 09/14/21 1321     Visit Number 10    Number of Visits 20    Date for OT Re-Evaluation 09/29/21    Authorization Type MCR 80/20%   [employer] N 1/1-12/31/2023   Copay 50.00 No visit limit follows MCR guidelines   And set for automatic crossover.   S/w Pernell Dupre [id number]    OT Start Time 1200    OT Stop Time 1300    OT Time Calculation (min) 60 min    Equipment Utilized During Treatment webex / power point    Activity Tolerance Patient tolerated treatment well    Behavior During Therapy WFL for tasks assessed/performed             Past Medical History:  Diagnosis Date   Anxiety    Arthritis    Bell's palsy    COPD (chronic obstructive pulmonary disease) ([place])    History of cardiac murmur as a child    Hyperlipidemia    Hypertension    Menopausal syndrome    Mild asthma    no inhalers since stopped smoking   Myocardial infarction ([place])    2020--pt states no damage   Neuroma of foot     OSA on CPAP    Wears glasses     Past Surgical History:  Procedure Laterality Date   CORONARY STENT INTERVENTION N/A 07/14/2021   Procedure: CORONARY STENT INTERVENTION;  Surgeon: [name], MD;  Location: [facility];  Service: Cardiovascular;  Laterality: N/A;   ELBOW SURGERY Right 2002   repair tendon and nerve injury   EXCISION MORTON'S NEUROMA Left 2010   INTRAVASCULAR IMAGING/OCT N/A 07/14/2021   Procedure: INTRAVASCULAR IMAGING/OCT;  Surgeon: [name], MD;  Location: [facility];  Service: Cardiovascular;  Laterality: N/A;   INTRAVASCULAR PRESSURE WIRE/FFR STUDY N/A 07/14/2021   Procedure: INTRAVASCULAR PRESSURE WIRE/FFR STUDY;  Surgeon: [name], MD;  Location: [facility];  Service: Cardiovascular;  Laterality: N/A;   LEFT HEART CATH AND CORONARY ANGIOGRAPHY N/A 07/14/2021   Procedure: LEFT HEART CATH AND CORONARY ANGIOGRAPHY;  Surgeon: [name], MD;  Location: [facility];  Service: Cardiovascular;  Laterality: N/A;   ROBOTIC ASSISTED TOTAL HYSTERECTOMY WITH BILATERAL SALPINGO OOPHERECTOMY N/A 12/31/2017   Procedure: XI ROBOTIC ASSISTED TOTAL HYSTERECTOMY WITH BILATERAL SALPINGO OOPHORECTOMY;  Surgeon: Everitt Amber, MD;  [facility];  Service: Gynecology;  Laterality: N/A;   SHOULDER ARTHROSCOPY W/ ROTATOR CUFF REPAIR Left 09/2016   and burectomy  There were no vitals filed for this visit.   Subjective Assessment - 09/14/21 1320     Currently in Pain? No/denies    Pain Score 0-No pain    Multiple Pain Sites No             Group Session:  S: "What do you do when you try these things and people just continue to walk all over you?"  O: The therapy session was led by an occupational therapist via telehealth with seven patients in attendance. The focus of the session was on exploring the power of reciprocity and fairness to improve relationships and overall mental health and wellbeing. At the beginning of the  session, the OT provided an introduction to the topic and defined the session's objectives. The participants then engaged in a discussion about their past experiences with the concept of reciprocity and how these experiences have influenced their lives and relationships, both positively and negatively. Throughout the session, the OT emphasized the importance of reciprocal relationships and how this concept can serve as a guide for future engagements with relationships and relationship development.  A: The patient actively participated in the therapy session on the power of reciprocity and fairness to improve relationships and overall mental health and wellbeing. They shared their past experiences with the concept of reciprocity and demonstrated an understanding of its importance in relationships. The patient engaged in meaningful discussion with the other participants and appeared to grasp the objectives of the session. The occupational therapist was able to provide guidance and support as needed, and the patient demonstrated a willingness to apply what was discussed to their own life and relationships.  P: Continue to attend PHP OT group sessions 5x week for 4 weeks to promote daily structure, social engagement, and opportunities to develop and utilize adaptive strategies to maximize functional performance in preparation for safe transition and integration back into school, work, and the community. Plan to address topic of tbd in next OT group session.                      OT Education - 09/14/21 1320     Education Details The Power of Reciprocity 2/2    Person(s) Educated Patient    Methods Explanation;Demonstration;Handout    Comprehension Need further instruction              OT Short Term Goals - 08/30/21 1903       OT SHORT TERM GOAL #1   Title Pt will demonstrate independence in cpoing skills in order to perform and engage in improved relationships and community  reentry    Time 4    Period Weeks    Status New    Target Date 09/29/21      OT SHORT TERM GOAL #2   Title pt will demonstrate independence with newly learned time mgmt skills to better and independently employ successful routines and structure at home and w/in community re-entry.                      Plan - 09/14/21 1321     Psychosocial Skills Coping Strategies;Habits;Routines and Behaviors;Interpersonal Interaction             Patient will benefit from skilled therapeutic intervention in order to improve the following deficits and impairments:       Psychosocial Skills: Coping Strategies, Habits, Routines and Behaviors, Interpersonal Interaction   Visit Diagnosis: Difficulty coping    Problem List Patient  Active Problem List   Diagnosis Date Noted   MDD (major depressive disorder), recurrent episode, severe ([place]) 08/24/2021   Hypertensive heart disease without heart failure 08/02/2021   Coronary artery disease involving native coronary artery of native heart without angina pectoris 08/02/2021   OSA on CPAP 08/02/2021   CAD (coronary artery disease) 07/14/2021   Hypervolemia 07/14/2021   Angina pectoris ([place])    Facial droop 01/02/2021   Dysphagia 01/02/2021   Hypertension 02/18/2019   Hyperlipidemia 11/18/2018   Congestive heart failure of unknown etiology ([place]) 10/09/2018   Vitamin D deficiency 08/19/2018   Hypothyroid 08/01/2018   B12 deficiency 08/01/2018   Bilateral ovarian cysts 12/31/2017   Routine general medical examination at a health care facility 06/14/2014   Cough 05/04/2013   Fatigue 01/14/2012   Skin tag 08/23/2011   Obstructive sleep apnea 11/30/2008   Anxiety state 01/15/2008   DEPRESSIVE DSORDER, RCR, FULL REMISSION 11/28/2006   BENIGN POSITIONAL VERTIGO 11/21/2006   ALLERGIC RHINITIS 11/21/2006    Brantley Stage, OT 09/14/2021, 1:21 PM  Cornell Barman, OT   [facility] HOSPITALIZATION  PROGRAM [place], [place], [address] Phone: [phone]   Fax:  [phone]  Name: Erika [name] MRN: [id number] Date of Birth: [birth year]

## 2021-09-15 ENCOUNTER — Encounter (HOSPITAL_COMMUNITY): Payer: Self-pay

## 2021-09-15 ENCOUNTER — Other Ambulatory Visit (HOSPITAL_COMMUNITY): Payer: Medicare Other | Admitting: Licensed Clinical Social Worker

## 2021-09-15 ENCOUNTER — Other Ambulatory Visit (HOSPITAL_COMMUNITY): Payer: Medicare Other

## 2021-09-15 DIAGNOSIS — R4589 Other symptoms and signs involving emotional state: Secondary | ICD-10-CM

## 2021-09-15 DIAGNOSIS — F332 Major depressive disorder, recurrent severe without psychotic features: Secondary | ICD-10-CM | POA: Diagnosis not present

## 2021-09-15 DIAGNOSIS — Z79899 Other long term (current) drug therapy: Secondary | ICD-10-CM | POA: Diagnosis not present

## 2021-09-15 DIAGNOSIS — Z72820 Sleep deprivation: Secondary | ICD-10-CM | POA: Diagnosis not present

## 2021-09-15 DIAGNOSIS — F419 Anxiety disorder, unspecified: Secondary | ICD-10-CM | POA: Diagnosis not present

## 2021-09-15 NOTE — Therapy (Addendum)
[name], [place], [address] Phone: [phone]   Fax:  [phone]  Occupational Therapy Treatment Virtual Visit via Video Note  I connected with Erika [name] on 09/15/21 at  8:00 AM EDT by a video enabled telemedicine application and verified that I am speaking with the correct person using two identifiers.  Location: Patient: home Provider: office   I discussed the limitations of evaluation and management by telemedicine and the availability of in person appointments. The patient expressed understanding and agreed to proceed.    The patient was advised to call back or seek an in-person evaluation if the symptoms worsen or if the condition fails to improve as anticipated.  I provided 55 minutes of non-face-to-face time during this encounter.   Patient Details  Name: Erika [name] MRN: [id number] Date of Birth: 1955/09/01 No data recorded  Encounter Date: 09/15/2021   OT End of Session - 09/15/21 1834     Visit Number 11    Number of Visits 20    Date for OT Re-Evaluation 09/29/21    Authorization Type MCR 80/20%   [employer] N 1/1-12/31/2023   Copay 50.00 No visit limit follows MCR guidelines   And set for automatic crossover.   S/w Pernell Dupre [id number]    OT Start Time 1200    OT Stop Time 1255    OT Time Calculation (min) 55 min             Past Medical History:  Diagnosis Date   Anxiety    Arthritis    Bell's palsy    COPD (chronic obstructive pulmonary disease) ([place])    History of cardiac murmur as a child    Hyperlipidemia    Hypertension    Menopausal syndrome    Mild asthma    no inhalers since stopped smoking   Myocardial infarction ([place])    2020--pt states no damage   Neuroma of foot    OSA on CPAP    Wears glasses     Past Surgical History:  Procedure Laterality Date   CORONARY STENT INTERVENTION N/A 07/14/2021   Procedure: CORONARY STENT INTERVENTION;   Surgeon: [name], MD;  Location: [place] [facility] [facility];  Service: Cardiovascular;  Laterality: N/A;   ELBOW SURGERY Right 2002   repair tendon and nerve injury   EXCISION MORTON'S NEUROMA Left 2010   INTRAVASCULAR IMAGING/OCT N/A 07/14/2021   Procedure: INTRAVASCULAR IMAGING/OCT;  Surgeon: [name], MD;  Location: [facility];  Service: Cardiovascular;  Laterality: N/A;   INTRAVASCULAR PRESSURE WIRE/FFR STUDY N/A 07/14/2021   Procedure: INTRAVASCULAR PRESSURE WIRE/FFR STUDY;  Surgeon: [name], MD;  Location: [place] [facility];  Service: Cardiovascular;  Laterality: N/A;   LEFT HEART CATH AND CORONARY ANGIOGRAPHY N/A 07/14/2021   Procedure: LEFT HEART CATH AND CORONARY ANGIOGRAPHY;  Surgeon: [name], MD;  Location: [facility];  Service: Cardiovascular;  Laterality: N/A;   ROBOTIC ASSISTED TOTAL HYSTERECTOMY WITH BILATERAL SALPINGO OOPHERECTOMY N/A 12/31/2017   Procedure: XI ROBOTIC ASSISTED TOTAL HYSTERECTOMY WITH BILATERAL SALPINGO OOPHORECTOMY;  Surgeon: Everitt Amber, MD;  [facility];  Service: Gynecology;  Laterality: N/A;   SHOULDER ARTHROSCOPY W/ ROTATOR CUFF REPAIR Left 09/2016   and burectomy    There were no vitals filed for this visit.   Subjective Assessment - 09/15/21 1834     Currently in Pain? No/denies    Pain Score 0-No  pain    Multiple Pain Sites No              Group Session:  S: "I have noticed I have had trouble with sleep and I believe it is also effecting my memory"  O: During the group therapy session, the occupational therapist discussed the impact of sleep disturbances on daily activities and overall health and wellbeing.   The OT also reviewed various types of sleep disorders, including insomnia, sleep apnea, restless leg syndrome, and narcolepsy, and their associated symptoms. Strategies for managing and treating sleep disturbances were also discussed, such as establishing a consistent sleep routine,  avoiding stimulants before bedtime, and engaging in relaxation techniques.   Today's group also included information on how sleep disturbances can cause fatigue, mood changes, cognitive impairment, and physical health problems, and emphasizes the importance of seeking prompt treatment to maintain overall health and wellbeing.   A: Based on today's performance, [name] would continue to benefit from cont'd PHP OT group therapy intervention in order to address the deficits that impact and inhibit independence in community re-entry and psychosocial dynamics / skills.    P: Continue to attend PHP OT group sessions 5x week for 4 weeks to promote daily structure, social engagement, and opportunities to develop and utilize adaptive strategies to maximize functional performance in preparation for safe transition and integration back into school, work, and the community. Plan to address topic of Sleep Hygiene in next OT group session.                     OT Education - 09/15/21 1834     Education Details Sleep Hygiene              OT Short Term Goals - 08/30/21 1903       OT SHORT TERM GOAL #1   Title Pt will demonstrate independence in cpoing skills in order to perform and engage in improved relationships and community reentry    Time 4    Period Weeks    Status New    Target Date 09/29/21      OT SHORT TERM GOAL #2   Title pt will demonstrate independence with newly learned time mgmt skills to better and independently employ successful routines and structure at home and w/in community re-entry.                      Plan - 09/15/21 1835     Psychosocial Skills Coping Strategies;Habits;Routines and Behaviors;Interpersonal Interaction             Patient will benefit from skilled therapeutic intervention in order to improve the following deficits and impairments:       Psychosocial Skills: Coping Strategies, Habits, Routines and Behaviors,  Interpersonal Interaction   Visit Diagnosis: Difficulty coping    Problem List Patient Active Problem List   Diagnosis Date Noted   MDD (major depressive disorder), recurrent episode, severe ([place]) 08/24/2021   Hypertensive heart disease without heart failure 08/02/2021   Coronary artery disease involving native coronary artery of native heart without angina pectoris 08/02/2021   OSA on CPAP 08/02/2021   CAD (coronary artery disease) 07/14/2021   Hypervolemia 07/14/2021   Angina pectoris ([place])    Facial droop 01/02/2021   Dysphagia 01/02/2021   Hypertension 02/18/2019   Hyperlipidemia 11/18/2018   Congestive heart failure of unknown etiology ([place]) 10/09/2018   Vitamin D deficiency 08/19/2018   Hypothyroid 08/01/2018   B12 deficiency 08/01/2018  Bilateral ovarian cysts 12/31/2017   Routine general medical examination at a health care facility 06/14/2014   Cough 05/04/2013   Fatigue 01/14/2012   Skin tag 08/23/2011   Obstructive sleep apnea 11/30/2008   Anxiety state 01/15/2008   DEPRESSIVE DSORDER, RCR, FULL REMISSION 11/28/2006   BENIGN POSITIONAL VERTIGO 11/21/2006   ALLERGIC RHINITIS 11/21/2006    Brantley Stage, OT 09/15/2021, 6:35 PM  Cornell Barman, OT   [facility] HOSPITALIZATION PROGRAM [place], [place], [address] Phone: [phone]   Fax:  [phone]  Name: SHASHANA FULLINGTON MRN: [id number] Date of Birth: 02/09/1956

## 2021-09-18 ENCOUNTER — Other Ambulatory Visit (HOSPITAL_COMMUNITY): Payer: Medicare Other

## 2021-09-18 ENCOUNTER — Other Ambulatory Visit (HOSPITAL_COMMUNITY): Payer: Medicare Other | Admitting: Licensed Clinical Social Worker

## 2021-09-18 ENCOUNTER — Encounter (HOSPITAL_COMMUNITY): Payer: Self-pay

## 2021-09-18 DIAGNOSIS — Z72820 Sleep deprivation: Secondary | ICD-10-CM | POA: Diagnosis not present

## 2021-09-18 DIAGNOSIS — F419 Anxiety disorder, unspecified: Secondary | ICD-10-CM | POA: Diagnosis not present

## 2021-09-18 DIAGNOSIS — Z79899 Other long term (current) drug therapy: Secondary | ICD-10-CM | POA: Diagnosis not present

## 2021-09-18 DIAGNOSIS — F332 Major depressive disorder, recurrent severe without psychotic features: Secondary | ICD-10-CM | POA: Diagnosis not present

## 2021-09-18 DIAGNOSIS — R4589 Other symptoms and signs involving emotional state: Secondary | ICD-10-CM

## 2021-09-18 NOTE — Progress Notes (Signed)
Spoke with patient via webex video call, used 2 identifiers to correctly identify patient. States that groups are going great, she is understanding her feelings more. This will be her last week in PHP. She has enjoyed the group and is a little worried about leaving the supportive environment but will be picking out her therapist before she is discharged. On scale 1-10 as 10 being worst she rates depression at 1 and anxiety at 2. Denies SI/HI or AV  hallucinations. PHQ9=16. No issues or complaints. No side effects from medications.

## 2021-09-18 NOTE — Therapy (Signed)
[place], [place], [address] Phone: [phone]   Fax:  [phone]  Occupational Therapy Treatment Virtual Visit via Video Note  I connected with [name] on 09/18/21 at  8:00 AM EDT by a video enabled telemedicine application and verified that I am speaking with the correct person using two identifiers.  Location: Patient: home Provider: office   I discussed the limitations of evaluation and management by telemedicine and the availability of in person appointments. The patient expressed understanding and agreed to proceed.    The patient was advised to call back or seek an in-person evaluation if the symptoms worsen or if the condition fails to improve as anticipated.  I provided 55 minutes of non-face-to-face time during this encounter.   Patient Details  Name: Erika [name] MRN: [id number] Date of Birth: 03/03/1956 No data recorded  Encounter Date: 09/18/2021   OT End of Session - 09/18/21 1833     Visit Number 12    Number of Visits 20    Date for OT Re-Evaluation 09/29/21    Authorization Type MCR 80/20%   [employer] N 1/1-12/31/2023   Copay 50.00 No visit limit follows MCR guidelines   And set for automatic crossover.   S/w Pernell Dupre [id number]    OT Start Time 1200    OT Stop Time 1255    OT Time Calculation (min) 55 min             Past Medical History:  Diagnosis Date   Anxiety    Arthritis    Bell's palsy    COPD (chronic obstructive pulmonary disease) ([place])    History of cardiac murmur as a child    Hyperlipidemia    Hypertension    Menopausal syndrome    Mild asthma    no inhalers since stopped smoking   Myocardial infarction ([place])    2020--pt states no damage   Neuroma of foot    OSA on CPAP    Wears glasses     Past Surgical History:  Procedure Laterality Date   CORONARY STENT INTERVENTION N/A 07/14/2021   Procedure: CORONARY STENT INTERVENTION;   Surgeon: [name], MD;  Location: [facility];  Service: Cardiovascular;  Laterality: N/A;   ELBOW SURGERY Right 2002   repair tendon and nerve injury   EXCISION MORTON'S NEUROMA Left 2010   INTRAVASCULAR IMAGING/OCT N/A 07/14/2021   Procedure: INTRAVASCULAR IMAGING/OCT;  Surgeon: [name], MD;  Location: [facility];  Service: Cardiovascular;  Laterality: N/A;   INTRAVASCULAR PRESSURE WIRE/FFR STUDY N/A 07/14/2021   Procedure: INTRAVASCULAR PRESSURE WIRE/FFR STUDY;  Surgeon: [name], MD;  Location: [facility];  Service: Cardiovascular;  Laterality: N/A;   LEFT HEART CATH AND CORONARY ANGIOGRAPHY N/A 07/14/2021   Procedure: LEFT HEART CATH AND CORONARY ANGIOGRAPHY;  Surgeon: [name], MD;  Location: [facility];  Service: Cardiovascular;  Laterality: N/A;   ROBOTIC ASSISTED TOTAL HYSTERECTOMY WITH BILATERAL SALPINGO OOPHERECTOMY N/A 12/31/2017   Procedure: XI ROBOTIC ASSISTED TOTAL HYSTERECTOMY WITH BILATERAL SALPINGO OOPHORECTOMY;  Surgeon: Everitt Amber, MD;  Location: [facility];  Service: Gynecology;  Laterality: N/A;   SHOULDER ARTHROSCOPY W/ ROTATOR CUFF REPAIR Left 09/2016   and burectomy    There were no vitals filed for this visit.   Subjective Assessment - 09/18/21 1832     Currently in Pain? No/denies    Pain Score 0-No  pain               Group Session:  S: Had a better weekend, sleep is getting a little better I guess"  O: During the group therapy session, the occupational therapist discussed the impact of sleep disturbances on daily activities and overall health and wellbeing.   The OT also reviewed various types of sleep disorders, including insomnia, sleep apnea, restless leg syndrome, and narcolepsy, and their associated symptoms. Strategies for managing and treating sleep disturbances were also discussed, such as establishing a consistent sleep routine, avoiding stimulants before bedtime, and engaging in  relaxation techniques.   Today's group also included information on how sleep disturbances can cause fatigue, mood changes, cognitive impairment, and physical health problems, and emphasizes the importance of seeking prompt treatment to maintain overall health and wellbeing.  A: Based on today's performance, pt would continue to benefit from cont'd PHP OT group therapy intervention in order to address the deficits that impact and inhibit independence in community re-entry and psychosocial dynamics / skills.   P: Continue to attend PHP OT group sessions 5x week for 4 weeks to promote daily structure, social engagement, and opportunities to develop and utilize adaptive strategies to maximize functional performance in preparation for safe transition and integration back into school, work, and the community. Plan to address topic of tbd in next OT group session.                    OT Education - 09/18/21 1832     Education Details Sleep Hygiene              OT Short Term Goals - 08/30/21 1903       OT SHORT TERM GOAL #1   Title Pt will demonstrate independence in cpoing skills in order to perform and engage in improved relationships and community reentry    Time 4    Period Weeks    Status New    Target Date 09/29/21      OT SHORT TERM GOAL #2   Title pt will demonstrate independence with newly learned time mgmt skills to better and independently employ successful routines and structure at home and w/in community re-entry.                      Plan - 09/18/21 1833     Psychosocial Skills Coping Strategies;Habits;Routines and Behaviors;Interpersonal Interaction             Patient will benefit from skilled therapeutic intervention in order to improve the following deficits and impairments:       Psychosocial Skills: Coping Strategies, Habits, Routines and Behaviors, Interpersonal Interaction   Visit Diagnosis: Difficulty coping    Problem  List Patient Active Problem List   Diagnosis Date Noted   MDD (major depressive disorder), recurrent episode, severe ([place]) 08/24/2021   Hypertensive heart disease without heart failure 08/02/2021   Coronary artery disease involving native coronary artery of native heart without angina pectoris 08/02/2021   OSA on CPAP 08/02/2021   CAD (coronary artery disease) 07/14/2021   Hypervolemia 07/14/2021   Angina pectoris ([place])    Facial droop 01/02/2021   Dysphagia 01/02/2021   Hypertension 02/18/2019   Hyperlipidemia 11/18/2018   Congestive heart failure of unknown etiology ([place]) 10/09/2018   Vitamin D deficiency 08/19/2018   Hypothyroid 08/01/2018   B12 deficiency 08/01/2018   Bilateral ovarian cysts 12/31/2017   Routine general medical examination at a health care facility  06/14/2014   Cough 05/04/2013   Fatigue 01/14/2012   Skin tag 08/23/2011   Obstructive sleep apnea 11/30/2008   Anxiety state 01/15/2008   DEPRESSIVE DSORDER, RCR, FULL REMISSION 11/28/2006   BENIGN POSITIONAL VERTIGO 11/21/2006   ALLERGIC RHINITIS 11/21/2006    Brantley Stage, OT 09/18/2021, 6:34 PM  Cornell Barman, OT   [facility] HOSPITALIZATION PROGRAM [place], [place], [address] Phone: [phone]   Fax:  [phone]  Name: Erika [name] MRN: [id number] Date of Birth: 09/15/1955

## 2021-09-19 ENCOUNTER — Other Ambulatory Visit (HOSPITAL_COMMUNITY): Payer: Medicare Other | Admitting: Licensed Clinical Social Worker

## 2021-09-19 ENCOUNTER — Encounter (HOSPITAL_COMMUNITY): Payer: Self-pay

## 2021-09-19 ENCOUNTER — Other Ambulatory Visit (HOSPITAL_COMMUNITY): Payer: Medicare Other

## 2021-09-19 DIAGNOSIS — Z79899 Other long term (current) drug therapy: Secondary | ICD-10-CM | POA: Diagnosis not present

## 2021-09-19 DIAGNOSIS — R4589 Other symptoms and signs involving emotional state: Secondary | ICD-10-CM

## 2021-09-19 DIAGNOSIS — Z72820 Sleep deprivation: Secondary | ICD-10-CM | POA: Diagnosis not present

## 2021-09-19 DIAGNOSIS — F332 Major depressive disorder, recurrent severe without psychotic features: Secondary | ICD-10-CM | POA: Diagnosis not present

## 2021-09-19 DIAGNOSIS — F419 Anxiety disorder, unspecified: Secondary | ICD-10-CM | POA: Diagnosis not present

## 2021-09-19 NOTE — Therapy (Signed)
[place], [place], [address] Phone: [phone]   Fax:  [phone]  Occupational Therapy Treatment Virtual Visit via Video Note  I connected with [name] on 09/19/21 at  8:00 AM EDT by a video enabled telemedicine application and verified that I am speaking with the correct person using two identifiers.  Location: Patient: home Provider: office   I discussed the limitations of evaluation and management by telemedicine and the availability of in person appointments. The patient expressed understanding and agreed to proceed.    The patient was advised to call back or seek an in-person evaluation if the symptoms worsen or if the condition fails to improve as anticipated.  I provided 55 minutes of non-face-to-face time during this encounter.   Patient Details  Name: Erika [name] MRN: [id number] Date of Birth: 06-Sep-1955 No data recorded  Encounter Date: 09/19/2021   OT End of Session - 09/19/21 1335     Visit Number 13    Number of Visits 20    Date for OT Re-Evaluation 09/29/21    Authorization Type MCR 80/20%   [employer] N 1/1-12/31/2023   Copay 50.00 No visit limit follows MCR guidelines   And set for automatic crossover.   S/w Pernell Dupre [id number]    OT Start Time 1200    OT Stop Time 1255    OT Time Calculation (min) 55 min             Past Medical History:  Diagnosis Date   Anxiety    Arthritis    Bell's palsy    COPD (chronic obstructive pulmonary disease) ([place])    History of cardiac murmur as a child    Hyperlipidemia    Hypertension    Menopausal syndrome    Mild asthma    no inhalers since stopped smoking   Myocardial infarction ([place])    2020--pt states no damage   Neuroma of foot    OSA on CPAP    Wears glasses     Past Surgical History:  Procedure Laterality Date   CORONARY STENT INTERVENTION N/A 07/14/2021   Procedure: CORONARY STENT INTERVENTION;   Surgeon: [name], MD;  Location: [facility];  Service: Cardiovascular;  Laterality: N/A;   ELBOW SURGERY Right 2002   repair tendon and nerve injury   EXCISION MORTON'S NEUROMA Left 2010   INTRAVASCULAR IMAGING/OCT N/A 07/14/2021   Procedure: INTRAVASCULAR IMAGING/OCT;  Surgeon: [name], MD;  Location: [facility];  Service: Cardiovascular;  Laterality: N/A;   INTRAVASCULAR PRESSURE WIRE/FFR STUDY N/A 07/14/2021   Procedure: INTRAVASCULAR PRESSURE WIRE/FFR STUDY;  Surgeon: [name], MD;  Location: [facility];  Service: Cardiovascular;  Laterality: N/A;   LEFT HEART CATH AND CORONARY ANGIOGRAPHY N/A 07/14/2021   Procedure: LEFT HEART CATH AND CORONARY ANGIOGRAPHY;  Surgeon: [name], MD;  Location: [facility];  Service: Cardiovascular;  Laterality: N/A;   ROBOTIC ASSISTED TOTAL HYSTERECTOMY WITH BILATERAL SALPINGO OOPHERECTOMY N/A 12/31/2017   Procedure: XI ROBOTIC ASSISTED TOTAL HYSTERECTOMY WITH BILATERAL SALPINGO OOPHORECTOMY;  Surgeon: Everitt Amber, MD;  Location: [facility];  Service: Gynecology;  Laterality: N/A;   SHOULDER ARTHROSCOPY W/ ROTATOR CUFF REPAIR Left 09/2016   and burectomy    There were no vitals filed for this visit.   Subjective Assessment - 09/19/21 1335     Currently in Pain? No/denies    Pain Score 0-No  pain              Group Session:  S: "I definitely need help in this area, especially when it comes to the relationship with my sister since my mother's passing"   O: During the group therapy session on Stepping Out of Your Comfort Zone, patients actively engaged in discussions and activities focused on understanding the concept of the comfort zone and its impact on personal growth. The session aimed to provide patients with practical strategies and techniques for expanding their comfort zones, fostering a supportive and collaborative environment.  Participants openly shared their experiences with  depression and anxiety, expressing their desire for personal growth and a willingness to overcome the limitations imposed by their comfort zones. Interactive activities and group discussions allowed patients to explore misconceptions surrounding the comfort zone and the potential for growth beyond it. Inspirational quotes from historical figures were shared to inspire and motivate patients in their journey of stepping outside their comfort zones.  The session facilitated a safe space for patients to voice their thoughts, concerns, and aspirations related to stepping beyond their comfort zones. Therapeutic interventions, such as gradual exposure, setting achievable goals, seeking support, and embracing challenges, were explored. The group fostered open communication, empathy, and collaboration among patients, creating an environment conducive to personal growth and self-discovery.  Moving forward, future group sessions will continue to delve into practical strategies and techniques, incorporating activities like role-playing, creative expression, and mindfulness exercises. Patients will be encouraged to set individual goals, track their progress, and share their achievements and challenges in subsequent sessions. Additional resources, such as articles, books, or worksheets, will be provided to further support patients' understanding and application of stepping outside their comfort zones.  A: During the virtual group therapy session on Stepping Out of Your Comfort Zone, the patient actively participated in discussions, sharing personal experiences related to depression and anxiety. They demonstrated a strong willingness and eagerness to explore new experiences and challenges. The patient actively contributed to the session's content, expressing thoughts, concerns, and aspirations related to stepping beyond their comfort zone. Their active engagement and involvement in interactive activities indicated a positive  response to the session's objectives. The patient's active participation fostered a supportive and collaborative environment, contributing to a rich and meaningful group dynamic.  P: Continue to attend PHP OT group sessions 5x week for 4 weeks to promote daily structure, social engagement, and opportunities to develop and utilize adaptive strategies to maximize functional performance in preparation for safe transition and integration back into school, work, and the community. Plan to address topic of Embracing Growth: Stepping Beyond Your Comfort Zone 2/2 in next OT group session.                     OT Education - 09/19/21 1335     Education Details Embracing Growth: Stepping Beyond Your Comfort Zone 1/2              OT Short Term Goals - 08/30/21 1903       OT SHORT TERM GOAL #1   Title Pt will demonstrate independence in cpoing skills in order to perform and engage in improved relationships and community reentry    Time 4    Period Weeks    Status New    Target Date 09/29/21      OT SHORT TERM GOAL #2   Title pt will demonstrate independence with newly learned time mgmt skills to better and independently employ successful routines and  structure at home and w/in community re-entry.                      Plan - 09/19/21 1335     Psychosocial Skills Coping Strategies;Habits;Routines and Behaviors;Interpersonal Interaction             Patient will benefit from skilled therapeutic intervention in order to improve the following deficits and impairments:       Psychosocial Skills: Coping Strategies, Habits, Routines and Behaviors, Interpersonal Interaction   Visit Diagnosis: Difficulty coping    Problem List Patient Active Problem List   Diagnosis Date Noted   MDD (major depressive disorder), recurrent episode, severe ([place]) 08/24/2021   Hypertensive heart disease without heart failure 08/02/2021   Coronary artery disease involving native  coronary artery of native heart without angina pectoris 08/02/2021   OSA on CPAP 08/02/2021   CAD (coronary artery disease) 07/14/2021   Hypervolemia 07/14/2021   Angina pectoris ([place])    Facial droop 01/02/2021   Dysphagia 01/02/2021   Hypertension 02/18/2019   Hyperlipidemia 11/18/2018   Congestive heart failure of unknown etiology ([place]) 10/09/2018   Vitamin D deficiency 08/19/2018   Hypothyroid 08/01/2018   B12 deficiency 08/01/2018   Bilateral ovarian cysts 12/31/2017   Routine general medical examination at a health care facility 06/14/2014   Cough 05/04/2013   Fatigue 01/14/2012   Skin tag 08/23/2011   Obstructive sleep apnea 11/30/2008   Anxiety state 01/15/2008   DEPRESSIVE DSORDER, RCR, FULL REMISSION 11/28/2006   BENIGN POSITIONAL VERTIGO 11/21/2006   ALLERGIC RHINITIS 11/21/2006    Brantley Stage, OT 09/19/2021, 1:36 PM  Cornell Barman, OT   [facility] HOSPITALIZATION PROGRAM [place], [place], [address] Phone: [phone]   Fax:  [phone]  Name: Erika [name] MRN: [id number] Date of Birth: May 31, 1955

## 2021-09-20 ENCOUNTER — Other Ambulatory Visit (HOSPITAL_COMMUNITY): Payer: Medicare Other | Admitting: Licensed Clinical Social Worker

## 2021-09-20 ENCOUNTER — Other Ambulatory Visit (HOSPITAL_COMMUNITY): Payer: Medicare Other

## 2021-09-20 DIAGNOSIS — F419 Anxiety disorder, unspecified: Secondary | ICD-10-CM | POA: Diagnosis not present

## 2021-09-20 DIAGNOSIS — F332 Major depressive disorder, recurrent severe without psychotic features: Secondary | ICD-10-CM | POA: Diagnosis not present

## 2021-09-20 DIAGNOSIS — Z72820 Sleep deprivation: Secondary | ICD-10-CM | POA: Diagnosis not present

## 2021-09-20 DIAGNOSIS — Z79899 Other long term (current) drug therapy: Secondary | ICD-10-CM | POA: Diagnosis not present

## 2021-09-21 ENCOUNTER — Other Ambulatory Visit (HOSPITAL_COMMUNITY): Payer: Medicare Other

## 2021-09-21 ENCOUNTER — Telehealth: Payer: Self-pay | Admitting: Internal Medicine

## 2021-09-21 ENCOUNTER — Encounter (HOSPITAL_COMMUNITY): Payer: Self-pay

## 2021-09-21 ENCOUNTER — Other Ambulatory Visit (HOSPITAL_COMMUNITY): Payer: Medicare Other | Admitting: Licensed Clinical Social Worker

## 2021-09-21 DIAGNOSIS — E538 Deficiency of other specified B group vitamins: Secondary | ICD-10-CM

## 2021-09-21 DIAGNOSIS — F332 Major depressive disorder, recurrent severe without psychotic features: Secondary | ICD-10-CM | POA: Diagnosis not present

## 2021-09-21 DIAGNOSIS — Z72820 Sleep deprivation: Secondary | ICD-10-CM | POA: Diagnosis not present

## 2021-09-21 DIAGNOSIS — Z79899 Other long term (current) drug therapy: Secondary | ICD-10-CM | POA: Diagnosis not present

## 2021-09-21 DIAGNOSIS — F419 Anxiety disorder, unspecified: Secondary | ICD-10-CM | POA: Diagnosis not present

## 2021-09-21 DIAGNOSIS — R4589 Other symptoms and signs involving emotional state: Secondary | ICD-10-CM

## 2021-09-21 DIAGNOSIS — F3342 Major depressive disorder, recurrent, in full remission: Secondary | ICD-10-CM

## 2021-09-21 MED ORDER — CYANOCOBALAMIN 1000 MCG/ML IJ SOLN: INTRAMUSCULAR | 2 refills | Status: DC

## 2021-09-21 MED ORDER — CITALOPRAM HYDROBROMIDE 20 MG PO TABS: 20.0000 mg | ORAL_TABLET | Freq: Every day | ORAL | 0 refills | Status: DC

## 2021-09-21 MED ORDER — METOPROLOL SUCCINATE ER 25 MG PO TB24: 12.5000 mg | ORAL_TABLET | Freq: Every day | ORAL | 0 refills | Status: DC

## 2021-09-21 NOTE — Telephone Encounter (Signed)
Patient needs refills on    cyanocobalamin (,VITAMIN B-12,) 1000 MCG/ML injection   citalopram (CELEXA) 20 MG tablet    metoprolol succinate (TOPROL-XL) 25 MG 24 hr tablet  Patient has ran out of one but did not specify     Please send to  [facility], [address] AT [place] Phone:  [phone]  Fax:  [phone]       Patient wants all other pharmacies removed as she only goes to [facility]   Please advise

## 2021-09-21 NOTE — Therapy (Signed)
[facility] [place], [place], [address] Phone: [phone]   Fax:  [phone]  Occupational Therapy Treatment Virtual Visit via Video Note  I connected with Erika [name] on 09/21/21 at  8:00 AM EDT by a video enabled telemedicine application and verified that I am speaking with the correct person using two identifiers.  Location: Patient: home Provider: office   I discussed the limitations of evaluation and management by telemedicine and the availability of in person appointments. The patient expressed understanding and agreed to proceed.    The patient was advised to call back or seek an in-person evaluation if the symptoms worsen or if the condition fails to improve as anticipated.  I provided 55 minutes of non-face-to-face time during this encounter.   Patient Details  Name: Erika [name] MRN: [id number] Date of Birth: 31-Jan-1956 No data recorded  Encounter Date: 09/21/2021   OT End of Session - 09/21/21 1349     Visit Number 14    Number of Visits 20    Date for OT Re-Evaluation 09/29/21    Authorization Type MCR 80/20%   [employer] N 1/1-12/31/2023   Copay 50.00 No visit limit follows MCR guidelines   And set for automatic crossover.   S/w Pernell Dupre [id number]    OT Start Time 1200    OT Stop Time 1255    OT Time Calculation (min) 55 min             Past Medical History:  Diagnosis Date   Anxiety    Arthritis    Bell's palsy    COPD (chronic obstructive pulmonary disease) ([place])    History of cardiac murmur as a child    Hyperlipidemia    Hypertension    Menopausal syndrome    Mild asthma    no inhalers since stopped smoking   Myocardial infarction ([place])    2020--pt states no damage   Neuroma of foot    OSA on CPAP    Wears glasses     Past Surgical History:  Procedure Laterality Date   CORONARY STENT INTERVENTION N/A 07/14/2021   Procedure: CORONARY STENT INTERVENTION;   Surgeon: [name], MD;  Location: [facility];  Service: Cardiovascular;  Laterality: N/A;   ELBOW SURGERY Right 2002   repair tendon and nerve injury   EXCISION MORTON'S NEUROMA Left 2010   INTRAVASCULAR IMAGING/OCT N/A 07/14/2021   Procedure: INTRAVASCULAR IMAGING/OCT;  Surgeon: [name], MD;  Location: [facility];  Service: Cardiovascular;  Laterality: N/A;   INTRAVASCULAR PRESSURE WIRE/FFR STUDY N/A 07/14/2021   Procedure: INTRAVASCULAR PRESSURE WIRE/FFR STUDY;  Surgeon: [name], MD;  Location: [facility];  Service: Cardiovascular;  Laterality: N/A;   LEFT HEART CATH AND CORONARY ANGIOGRAPHY N/A 07/14/2021   Procedure: LEFT HEART CATH AND CORONARY ANGIOGRAPHY;  Surgeon: [name], MD;  Location: [facility];  Service: Cardiovascular;  Laterality: N/A;   ROBOTIC ASSISTED TOTAL HYSTERECTOMY WITH BILATERAL SALPINGO OOPHERECTOMY N/A 12/31/2017   Procedure: XI ROBOTIC ASSISTED TOTAL HYSTERECTOMY WITH BILATERAL SALPINGO OOPHORECTOMY;  Surgeon: Everitt Amber, MD;  [facility];  Service: Gynecology;  Laterality: N/A;   SHOULDER ARTHROSCOPY W/ ROTATOR CUFF REPAIR Left 09/2016   and burectomy    There were no vitals filed for this visit.   Subjective Assessment - 09/21/21 1349     Currently in Pain? No/denies    Pain Score 0-No  pain              Group Session:  S: "Feeling better today"  O: During the group therapy session on Stepping Out of Your Comfort Zone, patients actively engaged in discussions and activities focused on understanding the concept of the comfort zone and its impact on personal growth. The session aimed to provide patients with practical strategies and techniques for expanding their comfort zones, fostering a supportive and collaborative environment.  Participants openly shared their experiences with depression and anxiety, expressing their desire for personal growth and a willingness to overcome the  limitations imposed by their comfort zones. Interactive activities and group discussions allowed patients to explore misconceptions surrounding the comfort zone and the potential for growth beyond it. Inspirational quotes from historical figures were shared to inspire and motivate patients in their journey of stepping outside their comfort zones.  The session facilitated a safe space for patients to voice their thoughts, concerns, and aspirations related to stepping beyond their comfort zones. Therapeutic interventions, such as gradual exposure, setting achievable goals, seeking support, and embracing challenges, were explored. The group fostered open communication, empathy, and collaboration among patients, creating an environment conducive to personal growth and self-discovery.  Moving forward, future group sessions will continue to delve into practical strategies and techniques, incorporating activities like role-playing, creative expression, and mindfulness exercises. Patients will be encouraged to set individual goals, track their progress, and share their achievements and challenges in subsequent sessions. Additional resources, such as articles, books, or worksheets, will be provided to further support patients' understanding and application of stepping outside their comfort zones.  A: During the virtual group therapy session on Stepping Out of Your Comfort Zone, the patient actively participated in discussions, sharing personal experiences related to depression and anxiety. They demonstrated a strong willingness and eagerness to explore new experiences and challenges. The patient actively contributed to the session's content, expressing thoughts, concerns, and aspirations related to stepping beyond their comfort zone. Their active engagement and involvement in interactive activities indicated a positive response to the session's objectives. The patient's active participation fostered a supportive and  collaborative environment, contributing to a rich and meaningful group dynamic.  P: Continue to attend PHP OT group sessions 5x week for 4 weeks to promote daily structure, social engagement, and opportunities to develop and utilize adaptive strategies to maximize functional performance in preparation for safe transition and integration back into school, work, and the community. Plan to address topic of tbd in next OT group session.                     OT Education - 09/21/21 1349     Education Details Embracing Growth: Stepping Beyond Your Comfort Zone 2/2              OT Short Term Goals - 08/30/21 1903       OT SHORT TERM GOAL #1   Title Pt will demonstrate independence in cpoing skills in order to perform and engage in improved relationships and community reentry    Time 4    Period Weeks    Status New    Target Date 09/29/21      OT SHORT TERM GOAL #2   Title pt will demonstrate independence with newly learned time mgmt skills to better and independently employ successful routines and structure at home and w/in community re-entry.  Plan - 09/21/21 1350     Psychosocial Skills Coping Strategies;Habits;Routines and Behaviors;Interpersonal Interaction             Patient will benefit from skilled therapeutic intervention in order to improve the following deficits and impairments:       Psychosocial Skills: Coping Strategies, Habits, Routines and Behaviors, Interpersonal Interaction   Visit Diagnosis: Difficulty coping    Problem List Patient Active Problem List   Diagnosis Date Noted   MDD (major depressive disorder), recurrent episode, severe ([place]) 08/24/2021   Hypertensive heart disease without heart failure 08/02/2021   Coronary artery disease involving native coronary artery of native heart without angina pectoris 08/02/2021   OSA on CPAP 08/02/2021   CAD (coronary artery disease) 07/14/2021    Hypervolemia 07/14/2021   Angina pectoris ([place])    Facial droop 01/02/2021   Dysphagia 01/02/2021   Hypertension 02/18/2019   Hyperlipidemia 11/18/2018   Congestive heart failure of unknown etiology ([place]) 10/09/2018   Vitamin D deficiency 08/19/2018   Hypothyroid 08/01/2018   B12 deficiency 08/01/2018   Bilateral ovarian cysts 12/31/2017   Routine general medical examination at a health care facility 06/14/2014   Cough 05/04/2013   Fatigue 01/14/2012   Skin tag 08/23/2011   Obstructive sleep apnea 11/30/2008   Anxiety state 01/15/2008   DEPRESSIVE DSORDER, RCR, FULL REMISSION 11/28/2006   BENIGN POSITIONAL VERTIGO 11/21/2006   ALLERGIC RHINITIS 11/21/2006    Erika [name], OT 09/21/2021, 1:50 PM  Erika [name], [facility] [facility] [place], [place], [address] Phone: [phone]   Fax:  [phone]  Name: Erika [name] MRN: [id number] Date of Birth: Jan 27, 1956

## 2021-09-22 ENCOUNTER — Other Ambulatory Visit (HOSPITAL_COMMUNITY): Payer: Medicare Other | Admitting: Licensed Clinical Social Worker

## 2021-09-22 ENCOUNTER — Encounter (HOSPITAL_COMMUNITY): Payer: Self-pay | Admitting: Family

## 2021-09-22 ENCOUNTER — Encounter (HOSPITAL_COMMUNITY): Payer: Self-pay

## 2021-09-22 ENCOUNTER — Other Ambulatory Visit (HOSPITAL_COMMUNITY): Payer: Medicare Other

## 2021-09-22 DIAGNOSIS — F419 Anxiety disorder, unspecified: Secondary | ICD-10-CM | POA: Diagnosis not present

## 2021-09-22 DIAGNOSIS — Z79899 Other long term (current) drug therapy: Secondary | ICD-10-CM | POA: Diagnosis not present

## 2021-09-22 DIAGNOSIS — F332 Major depressive disorder, recurrent severe without psychotic features: Secondary | ICD-10-CM | POA: Diagnosis not present

## 2021-09-22 DIAGNOSIS — R4589 Other symptoms and signs involving emotional state: Secondary | ICD-10-CM

## 2021-09-22 DIAGNOSIS — Z72820 Sleep deprivation: Secondary | ICD-10-CM | POA: Diagnosis not present

## 2021-09-22 NOTE — Progress Notes (Signed)
  [facility] Partial Hospitalization Outpatient Program Discharge Summary  Erika [name] [id number]  Admission date: 09/03/2021 Discharge date: 09/22/2021  Reason for admission:[name] is a 66 y.o. Caucasian female presents with worsening depression and anxiety.  She reports becoming stressed and overwhelmed.  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  States lately she has not been able to sleep well, she stated that her sleep is not restful and is "broken."  Stated that she is "up and down all night." She denies any drugs or illicit drug use.  Denies that her husband is supportive.  States she cares for the 2 sons 1 of which is her stepson.  She reports she is followed by her primary care provider Dr. Jerilee Hoh where she is prescribed Wellbutrin, Celexa Lasix and Plavix.  Reports she was prescribed 80 mg of Celexa denied diagnosis with obsessive-compulsive disorder.  Denied history with seizure disorder.  States she is recently titrated downward to 20 mg of Celexa which she does not feel medication is helpful.    Progress in Program Toward Treatment Goals: Progressing [name] attended participated with daily group sessions with active and engaged participation.  Denying suicidal or homicidal ideations.  Denied auditory visual hallucinations.  Patient was offered to start Abilify to enhance current antidepressant during this program however she declined.  Initiated trazodone as needed.  She reported taking and tolerating well.  Progress (rationale): Patient stepping down to intensive outpatient programming  Collaboration of Care: Medication Management AEB continue Celexa and Wellbutrin as directed primary care provider  Patient/Guardian was advised Release of Information must be obtained prior to any record release in order to collaborate their care with an outside provider. Patient/Guardian was advised if they have not already done so to contact  the registration department to sign all necessary forms in order for [place] to release information regarding their care.   Consent: Patient/Guardian gives verbal consent for treatment and assignment of benefits for services provided during this visit. Patient/Guardian expressed understanding and agreed to proceed.   [facility] 09/22/2021

## 2021-09-22 NOTE — Therapy (Signed)
[place], [place], [address] Phone: [phone]   Fax:  [phone]  Occupational Therapy Treatment Virtual Visit via Video Note  I connected with Erika [name] on 09/22/21 at  8:00 AM EDT by a video enabled telemedicine application and verified that I am speaking with the correct person using two identifiers.  Location: Patient: home Provider: office   I discussed the limitations of evaluation and management by telemedicine and the availability of in person appointments. The patient expressed understanding and agreed to proceed.    The patient was advised to call back or seek an in-person evaluation if the symptoms worsen or if the condition fails to improve as anticipated.  I provided 55 minutes of non-face-to-face time during this encounter.   Patient Details  Name: Erika [name] MRN: [id number] Date of Birth: 03-26-1956 No data recorded  Encounter Date: 09/22/2021   OT End of Session - 09/22/21 1400     Visit Number 15    Number of Visits 20    Date for OT Re-Evaluation 09/29/21    Authorization Type MCR 80/20%   [employer] N 1/1-12/31/2023   Copay 50.00 No visit limit follows MCR guidelines   And set for automatic crossover.   S/w Pernell Dupre [id number]    OT Start Time 1200    OT Stop Time 1255    OT Time Calculation (min) 55 min             Past Medical History:  Diagnosis Date   Anxiety    Arthritis    Bell's palsy    COPD (chronic obstructive pulmonary disease) ([place])    History of cardiac murmur as a child    Hyperlipidemia    Hypertension    Menopausal syndrome    Mild asthma    no inhalers since stopped smoking   Myocardial infarction ([place])    2020--pt states no damage   Neuroma of foot    OSA on CPAP    Wears glasses     Past Surgical History:  Procedure Laterality Date   CORONARY STENT INTERVENTION N/A 07/14/2021   Procedure: CORONARY STENT INTERVENTION;   Surgeon: [name], MD;  Location: [facility];  Service: Cardiovascular;  Laterality: N/A;   ELBOW SURGERY Right 2002   repair tendon and nerve injury   EXCISION MORTON'S NEUROMA Left 2010   INTRAVASCULAR IMAGING/OCT N/A 07/14/2021   Procedure: INTRAVASCULAR IMAGING/OCT;  Surgeon: [name], MD;  Location: [facility];  Service: Cardiovascular;  Laterality: N/A;   INTRAVASCULAR PRESSURE WIRE/FFR STUDY N/A 07/14/2021   Procedure: INTRAVASCULAR PRESSURE WIRE/FFR STUDY;  Surgeon: [name], MD;  Location: [facility];  Service: Cardiovascular;  Laterality: N/A;   LEFT HEART CATH AND CORONARY ANGIOGRAPHY N/A 07/14/2021   Procedure: LEFT HEART CATH AND CORONARY ANGIOGRAPHY;  Surgeon: [name], MD;  Location: [facility];  Service: Cardiovascular;  Laterality: N/A;   ROBOTIC ASSISTED TOTAL HYSTERECTOMY WITH BILATERAL SALPINGO OOPHERECTOMY N/A 12/31/2017   Procedure: XI ROBOTIC ASSISTED TOTAL HYSTERECTOMY WITH BILATERAL SALPINGO OOPHORECTOMY;  Surgeon: Everitt Amber, MD;  [facility];  Service: Gynecology;  Laterality: N/A;   SHOULDER ARTHROSCOPY W/ ROTATOR CUFF REPAIR Left 09/2016   and burectomy    There were no vitals filed for this visit.   Subjective Assessment - 09/22/21 1359     Currently in Pain? No/denies    Pain Score 0-No  pain               Group Session:  S: "I really like today's topic, I have had to put my books down for awhile now because I just can't concentrate to read anymore"  O: The objective of the group therapy session was to explore the concept of working memory and its relationship with learning, specifically tailored to individuals experiencing mental health challenges. The session aimed to provide psychoeducation on the importance of intact working memory for attention, comprehension, and retention of information. Participants discussed common short-term memory problems they face in their lives, including  forgetting names, misplacing items, and struggling with memorization. They recognized the impact of depression, anxiety, and other mental health conditions on their working memory functioning.  The group facilitator guided the participants in brainstorming creative strategies to overcome these memory challenges within their individual capabilities. The group emphasized self-help approaches that could be implemented independently, taking into consideration the participants' mental health limitations. The session concluded with a focus on the importance of self-care, seeking professional help when needed, and the potential benefits of incorporating cognitive training exercises to enhance working memory.  Overall, the objective of the group therapy session was to provide a supportive and informative environment for participants to explore the topic of working memory, understand its relevance to their learning experiences, and equip them with practical strategies to mitigate memory-related difficulties in their daily lives.  A: During the telehealth session, the patient actively participated in the discussion on working memory and its impact on learning. They demonstrated a high level of interest and curiosity, actively sharing their experiences and challenges related to memory difficulties. The patient expressed frustration and a genuine desire to understand the interrelation between working memory, their mental health conditions (such as depression and anxiety), and their learning abilities.  The patient showed a strong willingness to learn and implement practical strategies to enhance their working memory within the limitations of their mental health condition. They actively contributed to the brainstorming session, providing creative ideas and actively seeking guidance from the group and the therapist. Their proactive approach and active involvement in the session indicate a strong motivation to improve  their cognitive functioning and overall well-being.   P: Continue to attend PHP OT group sessions 5x week for 4 weeks to promote daily structure, social engagement, and opportunities to develop and utilize adaptive strategies to maximize functional performance in preparation for safe transition and integration back into school, work, and the community. Plan to address topic of The Significance of Working Memory and its Impact on Adult Learning in next OT group session.     OCCUPATIONAL THERAPY DISCHARGE SUMMARY  Visits from Start of Care: 15  Current functional level related to goals / functional outcomes: Pt is independent w/ newly learned psychosocial skills including coping skills and time mgmt and is ready for discharge and community re-entry.    Remaining deficits: NA   Education / Equipment:   Plan: Patient agrees to discharge.                    OT Education - 09/22/21 1359     Education Details The Significance of Working Memory and its Impact on Adult Learning 1/2              OT Short Term Goals - 08/30/21 1903       OT [place] #1   Title Pt will demonstrate independence in cpoing skills in order to  perform and engage in improved relationships and community reentry    Time 4    Period Weeks    Status met   Target Date 09/29/21      OT SHORT TERM GOAL #2   Title pt will demonstrate independence with newly learned time mgmt skills to better and independently employ successful routines and structure at home and w/in community re-entry.   met                     Plan - 09/22/21 1400     Psychosocial Skills Coping Strategies;Habits;Routines and Behaviors;Interpersonal Interaction             Patient will benefit from skilled therapeutic intervention in order to improve the following deficits and impairments:       Psychosocial Skills: Coping Strategies, Habits, Routines and Behaviors, Interpersonal  Interaction   Visit Diagnosis: Difficulty coping    Problem List Patient Active Problem List   Diagnosis Date Noted   MDD (major depressive disorder), recurrent episode, severe ([place]) 08/24/2021   Hypertensive heart disease without heart failure 08/02/2021   Coronary artery disease involving native coronary artery of native heart without angina pectoris 08/02/2021   OSA on CPAP 08/02/2021   CAD (coronary artery disease) 07/14/2021   Hypervolemia 07/14/2021   Angina pectoris ([place])    Facial droop 01/02/2021   Dysphagia 01/02/2021   Hypertension 02/18/2019   Hyperlipidemia 11/18/2018   Congestive heart failure of unknown etiology ([place]) 10/09/2018   Vitamin D deficiency 08/19/2018   Hypothyroid 08/01/2018   B12 deficiency 08/01/2018   Bilateral ovarian cysts 12/31/2017   Routine general medical examination at a health care facility 06/14/2014   Cough 05/04/2013   Fatigue 01/14/2012   Skin tag 08/23/2011   Obstructive sleep apnea 11/30/2008   Anxiety state 01/15/2008   DEPRESSIVE DSORDER, RCR, FULL REMISSION 11/28/2006   BENIGN POSITIONAL VERTIGO 11/21/2006   ALLERGIC RHINITIS 11/21/2006    Brantley Stage, OT 09/22/2021, 2:00 PM Cornell Barman, OT   [facility] [place], [place], [address] Phone: [phone]   Fax:  [phone]  Name: Erika [name] MRN: [id number] Date of Birth: [birth year]

## 2021-09-25 ENCOUNTER — Ambulatory Visit (HOSPITAL_COMMUNITY): Payer: Medicare Other

## 2021-10-24 ENCOUNTER — Telehealth: Payer: Self-pay | Admitting: Internal Medicine

## 2021-10-24 ENCOUNTER — Other Ambulatory Visit: Payer: Self-pay | Admitting: Internal Medicine

## 2021-10-24 DIAGNOSIS — F3342 Major depressive disorder, recurrent, in full remission: Secondary | ICD-10-CM

## 2021-10-24 DIAGNOSIS — E039 Hypothyroidism, unspecified: Secondary | ICD-10-CM

## 2021-10-24 DIAGNOSIS — I10 Essential (primary) hypertension: Secondary | ICD-10-CM

## 2021-10-24 DIAGNOSIS — F411 Generalized anxiety disorder: Secondary | ICD-10-CM

## 2021-10-24 MED ORDER — CITALOPRAM HYDROBROMIDE 20 MG PO TABS: 20.0000 mg | ORAL_TABLET | Freq: Every day | ORAL | 0 refills | Status: DC

## 2021-10-24 NOTE — Telephone Encounter (Signed)
Refill sent.

## 2021-10-24 NOTE — Telephone Encounter (Signed)
Pt would like a refill of the  citalopram (CELEXA) 20 MG tablet  Also, she would like to know if it is possible to refill for 90day supply  Please send to   [place], [address] AT [address] Phone:  [phone]  Fax:  [phone]

## 2021-11-29 ENCOUNTER — Ambulatory Visit (INDEPENDENT_AMBULATORY_CARE_PROVIDER_SITE_OTHER): Payer: Medicare Other | Admitting: Internal Medicine

## 2021-11-29 ENCOUNTER — Encounter: Payer: Self-pay | Admitting: Internal Medicine

## 2021-11-29 VITALS — BP 110/70 | HR 70 | Temp 98.0°F | Wt 245.5 lb

## 2021-11-29 DIAGNOSIS — I2510 Atherosclerotic heart disease of native coronary artery without angina pectoris: Secondary | ICD-10-CM

## 2021-11-29 DIAGNOSIS — J069 Acute upper respiratory infection, unspecified: Secondary | ICD-10-CM | POA: Diagnosis not present

## 2021-11-29 MED ORDER — BENZONATATE 100 MG PO CAPS: 100.0000 mg | ORAL_CAPSULE | Freq: Two times a day (BID) | ORAL | 0 refills | Status: DC | PRN

## 2021-11-29 NOTE — Progress Notes (Signed)
Established Patient Office Visit     CC/Reason for Visit: Cough  HPI: Erika [name] is a 66 y.o. female who is coming in today for the above mentioned reasons.  For the past week she has been experiencing a slight headache, dry nonproductive cough, mild shortness of breath.  She has not had fever or myalgias.  She has had somewhat of a runny nose.  The week before this started she was taking care of her great granddaughter who was also sick with an ear infection and an unspecified viral illness.  Her husband is now feeling sick as well.  Past Medical/Surgical History: Past Medical History:  Diagnosis Date   Anxiety    Arthritis    Bell's palsy    COPD (chronic obstructive pulmonary disease) ([place])    History of cardiac murmur as a child    Hyperlipidemia    Hypertension    Menopausal syndrome    Mild asthma    no inhalers since stopped smoking   Myocardial infarction ([place])    2020--pt states no damage   Neuroma of foot    OSA on CPAP    Wears glasses     Past Surgical History:  Procedure Laterality Date   CORONARY STENT INTERVENTION N/A 07/14/2021   Procedure: CORONARY STENT INTERVENTION;  Surgeon: [name], MD;  Location: [facility];  Service: Cardiovascular;  Laterality: N/A;   ELBOW SURGERY Right 2002   repair tendon and nerve injury   EXCISION MORTON'S NEUROMA Left 2010   INTRAVASCULAR IMAGING/OCT N/A 07/14/2021   Procedure: INTRAVASCULAR IMAGING/OCT;  Surgeon: [name], MD;  Location: [facility];  Service: Cardiovascular;  Laterality: N/A;   INTRAVASCULAR PRESSURE WIRE/FFR STUDY N/A 07/14/2021   Procedure: INTRAVASCULAR PRESSURE WIRE/FFR STUDY;  Surgeon: [name], MD;  Location: [facility];  Service: Cardiovascular;  Laterality: N/A;   LEFT HEART CATH AND CORONARY ANGIOGRAPHY N/A 07/14/2021   Procedure: LEFT HEART CATH AND CORONARY ANGIOGRAPHY;  Surgeon: [name], MD;  Location: [facility];  Service:  Cardiovascular;  Laterality: N/A;   ROBOTIC ASSISTED TOTAL HYSTERECTOMY WITH BILATERAL SALPINGO OOPHERECTOMY N/A 12/31/2017   Procedure: XI ROBOTIC ASSISTED TOTAL HYSTERECTOMY WITH BILATERAL SALPINGO OOPHORECTOMY;  Surgeon: Everitt Amber, MD;  Location: [facility];  Service: Gynecology;  Laterality: N/A;   SHOULDER ARTHROSCOPY W/ ROTATOR CUFF REPAIR Left 09/2016   and burectomy    Social History:  reports that she quit smoking about 21 years ago. Her smoking use included cigarettes. She has a 30.00 pack-year smoking history. She has never used smokeless tobacco. She reports that she does not drink alcohol and does not use drugs.  Allergies: Allergies  Allergen Reactions   Tape Other (See Comments)    PATIENT PREFERS CLOTH TAPE- THE OTHERS DON'T AGREE WITH HER SKIN   Tetracycline Hcl Rash    Family History:  Family History  Problem Relation Age of Onset   Depression Mother    Diabetes Mother    Heart disease Mother    Asthma Sister    Cancer Brother    Cancer Maternal Aunt    Cancer Maternal Uncle    Uterine cancer Maternal Uncle    Diabetes Maternal Grandfather    Diabetes Maternal Grandmother    Cancer Paternal Grandmother    Colon cancer Neg Hx    Colon polyps Neg Hx    Esophageal cancer Neg Hx    Rectal cancer Neg Hx  Stomach cancer Neg Hx      Current Outpatient Medications:    acetaminophen (TYLENOL) 650 MG CR tablet, Take 650-1,300 mg by mouth every 8 (eight) hours as needed for pain., Disp: , Rfl:    albuterol (VENTOLIN HFA) 108 (90 Base) MCG/ACT inhaler, Inhale 2 puffs into the lungs every 4 (four) hours as needed for wheezing or shortness of breath., Disp: 18 g, Rfl: 0   aspirin EC 81 MG tablet, Take 1 tablet (81 mg total) by mouth daily. Swallow whole., Disp: , Rfl:    atorvastatin (LIPITOR) 40 MG tablet, TAKE 1 TABLET(40 MG) BY MOUTH DAILY, Disp: 90 tablet, Rfl: 0   benzonatate (TESSALON) 100 MG capsule, Take 1 capsule (100 mg total) by mouth 2 (two) times daily as  needed for cough., Disp: 20 capsule, Rfl: 0   buPROPion (WELLBUTRIN XL) 150 MG 24 hr tablet, TAKE 1 TABLET(150 MG) BY MOUTH DAILY, Disp: 90 tablet, Rfl: 0   cetirizine (ZYRTEC) 10 MG tablet, Take 10 mg by mouth daily as needed for allergies., Disp: , Rfl:    Cholecalciferol (VITAMIN D3 SUPER STRENGTH) 50 MCG (2000 UT) CAPS, Take 2,000 Units by mouth every evening., Disp: , Rfl:    citalopram (CELEXA) 20 MG tablet, Take 1 tablet (20 mg total) by mouth at bedtime., Disp: 90 tablet, Rfl: 0   clopidogrel (PLAVIX) 75 MG tablet, Take 1 tablet (75 mg total) by mouth daily., Disp: 90 tablet, Rfl: 1   cyanocobalamin (,VITAMIN B-12,) 1000 MCG/ML injection, INJECT 1 ML (1,000 MCG TOTAL) INTO THE MUSCLE EVERY 30 DAYS., Disp: 6 mL, Rfl: 2   diphenhydramine-acetaminophen (TYLENOL PM) 25-500 MG TABS tablet, Take 1 tablet by mouth at bedtime as needed (sleep)., Disp: , Rfl:    furosemide (LASIX) 20 MG tablet, Take 1 tablet (20 mg total) by mouth daily., Disp: 90 tablet, Rfl: 1   hydrochlorothiazide (HYDRODIURIL) 25 MG tablet, Take 1 tablet (25 mg total) by mouth every evening., Disp: 90 tablet, Rfl: 0   hydrOXYzine (VISTARIL) 25 MG capsule, Take 1 capsule (25 mg total) by mouth at bedtime and may repeat dose one time if needed., Disp: 60 capsule, Rfl: 0   levothyroxine (SYNTHROID) 75 MCG tablet, TAKE 1 TABLET(75 MCG) BY MOUTH DAILY BEFORE BREAKFAST, Disp: 90 tablet, Rfl: 0   metoprolol succinate (TOPROL-XL) 25 MG 24 hr tablet, Take 0.5 tablets (12.5 mg total) by mouth daily., Disp: 45 tablet, Rfl: 0   polyvinyl alcohol (LIQUIFILM TEARS) 1.4 % ophthalmic solution, Place 1 drop into both eyes 5 (five) times daily. (Patient taking differently: Place 1 drop into both eyes 5 (five) times daily as needed for dry eyes.), Disp: 15 mL, Rfl: 0   SYRINGE-NEEDLE, DISP, 3 ML (BD SAFETYGLIDE SYRINGE/NEEDLE) 25G X 1" 3 ML MISC, Use for B12 injections, Disp: 100 each, Rfl: 11   nitroGLYCERIN (NITROSTAT) 0.4 MG SL tablet, Place 1  tablet (0.4 mg total) under the tongue every 5 (five) minutes as needed for chest pain. Up to 3 times., Disp: 90 tablet, Rfl: 3  Review of Systems:  Constitutional: Denies fever, chills, diaphoresis, appetite change. HEENT: Denies photophobia, eye pain, redness, mouth sores, trouble swallowing, neck pain, neck stiffness and tinnitus.   Respiratory: Denies  chest tightness,  and wheezing.   Cardiovascular: Denies chest pain, palpitations and leg swelling.  Gastrointestinal: Denies nausea, vomiting, abdominal pain, diarrhea, constipation, blood in stool and abdominal distention.  Genitourinary: Denies dysuria, urgency, frequency, hematuria, flank pain and difficulty urinating.  Endocrine: Denies: hot or cold  intolerance, sweats, changes in hair or nails, polyuria, polydipsia. Musculoskeletal: Denies myalgias, back pain, joint swelling, arthralgias and gait problem.  Skin: Denies pallor, rash and wound.  Neurological: Denies dizziness, seizures, syncope, weakness, light-headedness, numbness and headaches.  Hematological: Denies adenopathy. Easy bruising, personal or family bleeding history  Psychiatric/Behavioral: Denies suicidal ideation, mood changes, confusion, nervousness, sleep disturbance and agitation    Physical Exam: Vitals:   11/29/21 1328  BP: 110/70  Pulse: 70  Temp: 98 F (36.7 C)  TempSrc: Oral  SpO2: 98%  Weight: 245 lb 8 oz (111.4 kg)    Body mass index is 38.45 kg/m.   Constitutional: NAD, calm, comfortable Eyes: PERRL, lids and conjunctivae normal, wears corrective lenses ENMT: Mucous membranes are moist. Posterior pharynx is erythematous but clear of any exudate or lesions. Normal dentition. Tympanic membrane is pearly white, no erythema or bulging. Respiratory: clear to auscultation bilaterally, no wheezing, no crackles. Normal respiratory effort. No accessory muscle use.  Cardiovascular: Regular rate and rhythm, no murmurs / rubs / gallops. No extremity edema.   Psychiatric: Normal judgment and insight. Alert and oriented x 3. Normal mood.    Impression and Plan:  Viral URI with cough  - Plan: benzonatate (TESSALON) 100 MG capsule -Given exam findings, PNA, pharyngitis, ear infection are not likely, hence abx have not been prescribed. -Have advised rest, fluids, OTC antihistamines, cough suppressants and mucinex. -RTC if no improvement in 10-14 days.     Time spent:22 minutes reviewing chart, interviewing and examining patient and formulating plan of care.      Lelon Frohlich, MD [facility]

## 2022-01-01 ENCOUNTER — Telehealth: Payer: Self-pay | Admitting: Internal Medicine

## 2022-01-01 ENCOUNTER — Other Ambulatory Visit: Payer: Self-pay | Admitting: Internal Medicine

## 2022-01-01 DIAGNOSIS — E538 Deficiency of other specified B group vitamins: Secondary | ICD-10-CM

## 2022-01-01 MED ORDER — METOPROLOL SUCCINATE ER 25 MG PO TB24: 12.5000 mg | ORAL_TABLET | Freq: Every day | ORAL | 0 refills | Status: DC

## 2022-01-01 MED ORDER — CYANOCOBALAMIN 1000 MCG/ML IJ SOLN: INTRAMUSCULAR | 0 refills | Status: DC

## 2022-01-01 NOTE — Telephone Encounter (Signed)
Last OV 11/29/21 for acute reasons.  No CPE in last year.  Pt notified of above. States she continues to do her B12 at home despite last b12 lab Jan 2022. Pt advised that will refill requested meds for 1 month to get to her appt. Pt verb understanding.

## 2022-01-01 NOTE — Telephone Encounter (Signed)
Pt called to ask for refills of the following:  B12 shot  metoprolol succinate (TOPROL-XL) 25 MG 24 hr tablet  LOV:  11/29/2021  Please send to  [facility] [place] Phone:  [phone]  Fax:  [phone]

## 2022-01-10 NOTE — Psych (Signed)
Virtual Visit via Video Note  I connected with [name] on 08/29/21 at  9:00 AM EDT by a video enabled telemedicine application and verified that I am speaking with the correct person using two identifiers.  Location: Patient: patient home Provider: clinical home office   I discussed the limitations of evaluation and management by telemedicine and the availability of in person appointments. The patient expressed understanding and agreed to proceed.  I discussed the assessment and treatment plan with the patient. The patient was provided an opportunity to ask questions and all were answered. The patient agreed with the plan and demonstrated an understanding of the instructions.   The patient was advised to call back or seek an in-person evaluation if the symptoms worsen or if the condition fails to improve as anticipated.  Pt was provided 240 minutes of non-face-to-face time during this encounter.   Erika Glass, LCSW   [facility] PHP THERAPIST PROGRESS NOTE  Erika [name] [id number]  Session Time: 9:00 - 10:00  Participation Level: Active  Behavioral Response: CasualAlertDepressed  Type of Therapy: Group Therapy  Treatment Goals addressed: Coping  Progress Towards Goals: Initial  Interventions: CBT, DBT, Supportive, and Reframing  Summary: Clinician led check-in regarding current stressors and situation, and review of patient completed daily inventory. Clinician utilized active listening and empathetic response and validated patient emotions. Clinician facilitated processing group on pertinent issues.?    Summary: Erika [name] is a 66 y.o. female who presents with depression symptoms. Patient arrived within time allowed. Patient rates her mood at a 8 on a scale of 1-10 with 10 being best. Pt states she feels "aggravated" Pt reports waking up is difficult for her due to her and her husband's conflicting schedules. Pt reports sleeping approximately 8 hours and eating 2  meals. Pt states she was "tired" yesterday and sat on the couch most of the day.  Patient able to process. Patient engaged in discussion.         Session Time: 10:00 am - 11:00 am   Participation Level: Active   Behavioral Response: CasualAlertDepressed   Type of Therapy: Group Therapy   Treatment Goals addressed: Coping   Progress Towards Goals: Progressing   Interventions: CBT, DBT, Solution Focused, Strength-based, Supportive, and Reframing   Therapist Response: Cln led discussion on anger and the way it impacts [place]. Cln utilized CBT cognitive distortion: emotional reasoning to inform discussion. Cln encouraged pt's to view anger as a warning from our bodies that something is not right. Cln utilized the anger iceberg to discuss the ways in which anger may be masking other feelings that are more difficult to express. Group discussed how they experience anger and what issues it has caused.    Therapist Response: Pt engaged in discussion and reports understanding.           Session Time: 11:00 -12:00   Participation Level: Active   Behavioral Response: CasualAlertDepressed   Type of Therapy: Group Therapy, Occupational Therapy   Treatment Goals addressed: Coping   Progress Towards Goals: Progressing   Interventions: Supportive, Education   Summary:  Occupational Therapy group led by cln E. Hollan.   Therapist Response: See OT note         Session Time: 12:00 -1:00   Participation Level: Active   Behavioral Response: CasualAlertDepressed   Type of Therapy: Group therapy   Treatment Goals addressed: Coping   Progress Towards Goals: Progressing   Interventions: CBT; Solution focused; Supportive; Reframing   Summary: 12:00 -  12:50: Cln led discussion on healthy aggression substitutes. Cln discussed the benefits to discharging energy and adrenaline when feeling "revved up" in emotion and the importance of balancing that discharge with safety and lack if  negative consequences. Group brainstormed different ways to channel aggression in a healthy way and shared ways that have worked for them in the past. 12:50 -1:00 Clinician led check-out. Clinician assessed for immediate needs, medication compliance and efficacy, and safety concerns.   Therapist Response: 12:00 - 12:50: Pt engaged in discussion and identifies 3 options to try.  12:50 - 1:00 pm: At check-out, patient reports no immediate concerns. Patient demonstrates progress as evidenced by participating in first group session. Patient denies SI/HI/self-harm thoughts at the end of group.     Suicidal/Homicidal: Nowithout intent/plan  Plan: Pt will continue in PHP while working to decrease depression symptoms, increase emotion regulation, and increase ability to manage symptoms in a healthy manner.   Collaboration of Care: Medication Management AEB T Lewis  Patient/Guardian was advised Release of Information must be obtained prior to any record release in order to collaborate their care with an outside provider. Patient/Guardian was advised if they have not already done so to contact the registration department to sign all necessary forms in order for [place] to release information regarding their care.   Consent: Patient/Guardian gives verbal consent for treatment and assignment of benefits for services provided during this visit. Patient/Guardian expressed understanding and agreed to proceed.   Diagnosis: Severe episode of recurrent major depressive disorder, without psychotic features ([place]) [F33.2]    1. Severe episode of recurrent major depressive disorder, without psychotic features ([place])       Erika Glass, LCSW

## 2022-01-10 NOTE — Psych (Signed)
Virtual Visit via Video Note  I connected with Erika [name] on 08/31/21 at  9:00 AM EDT by a video enabled telemedicine application and verified that I am speaking with the correct person using two identifiers.  Location: Patient: patient home Provider: clinical home office   I discussed the limitations of evaluation and management by telemedicine and the availability of in person appointments. The patient expressed understanding and agreed to proceed.  I discussed the assessment and treatment plan with the patient. The patient was provided an opportunity to ask questions and all were answered. The patient agreed with the plan and demonstrated an understanding of the instructions.   The patient was advised to call back or seek an in-person evaluation if the symptoms worsen or if the condition fails to improve as anticipated.  Pt was provided 240 minutes of non-face-to-face time during this encounter.   Lorin Glass, LCSW   [facility] PHP THERAPIST PROGRESS NOTE  Erika [name] [id number]  Session Time: 9:00 - 10:00  Participation Level: Active  Behavioral Response: CasualAlertDepressed  Type of Therapy: Group Therapy  Treatment Goals addressed: Coping  Progress Towards Goals: Progressing  Interventions: CBT, DBT, Supportive, and Reframing  Summary: Clinician led check-in regarding current stressors and situation, and review of patient completed daily inventory. Clinician utilized active listening and empathetic response and validated patient emotions. Clinician facilitated processing group on pertinent issues.?    Summary: Erika [name] is a 66 y.o. female who presents with depression symptoms. Patient arrived within time allowed. Patient rates her mood at a 7.5 on a scale of 1-10 with 10 being best. Pt states she feels "pretty good" Pt reports she spent the evening at her grandson's ball game and that lifted her mood. Pt reports some conflict with her son but it was minimal.  Pt reports napping instead of doing an activity as planned.  Patient able to process. Patient engaged in discussion.         Session Time: 10:00 am - 11:00 am   Participation Level: Active   Behavioral Response: CasualAlertDepressed   Type of Therapy: Group Therapy   Treatment Goals addressed: Coping   Progress Towards Goals: Progressing   Interventions: CBT, DBT, Solution Focused, Strength-based, Supportive, and Reframing   Therapist Response:  Cln introduced topic of DBT distress tolerance skills. Cln provided context for distress tolerance skills and how to practice them. Cln introduced the ACCEPTS distraction skills and group discusses ways to utilize "A" activities.    Therapist Response: Pt engaged in discussion and states ways to practice the "A" skill.          Session Time: 11:00 -12:00   Participation Level: Active   Behavioral Response: CasualAlertDepressed   Type of Therapy: Group Therapy, Occupational Therapy   Treatment Goals addressed: Coping   Progress Towards Goals: Progressing   Interventions: Supportive, Education   Summary:  Occupational Therapy group led by cln E. Hollan.   Therapist Response: See OT note         Session Time: 12:00 -1:00   Participation Level: Active   Behavioral Response: CasualAlertDepressed   Type of Therapy: Group therapy   Treatment Goals addressed: Coping   Progress Towards Goals: Progressing   Interventions: CBT; Solution focused; Supportive; Reframing   Summary: 12:00 - 12:50: Cln continued topic of DBT distress tolerance skills and the ACCEPTS distraction skill. Group reviewed C-C-E skills and discussed how they can practice them in their every day life.  12:50 -1:00 Clinician led check-out.  Clinician assessed for immediate needs, medication compliance and efficacy, and safety concerns.   Therapist Response: 12:00 - 12:50: Pt engaged in discussion and reports she is most likely to practice the "E" skill.   12:50 - 1:00 pm: At check-out, patient reports no immediate concerns. Patient demonstrates progress as evidenced by improved sleep. Patient denies SI/HI/self-harm thoughts at the end of group.     Suicidal/Homicidal: Nowithout intent/plan  Plan: Pt will continue in PHP while working to decrease depression symptoms, increase emotion regulation, and increase ability to manage symptoms in a healthy manner.   Collaboration of Care: Medication Management AEB T Lewis  Patient/Guardian was advised Release of Information must be obtained prior to any record release in order to collaborate their care with an outside provider. Patient/Guardian was advised if they have not already done so to contact the registration department to sign all necessary forms in order for [place] to release information regarding their care.   Consent: Patient/Guardian gives verbal consent for treatment and assignment of benefits for services provided during this visit. Patient/Guardian expressed understanding and agreed to proceed.   Diagnosis: Severe episode of recurrent major depressive disorder, without psychotic features ([place]) [F33.2]    1. Severe episode of recurrent major depressive disorder, without psychotic features ([place])       Lorin Glass, LCSW

## 2022-01-10 NOTE — Psych (Signed)
Virtual Visit via Video Note  I connected with Erika [name] on 09/01/21 at  9:00 AM EDT by a video enabled telemedicine application and verified that I am speaking with the correct person using two identifiers.  Location: Patient: patient home Provider: clinical home office   I discussed the limitations of evaluation and management by telemedicine and the availability of in person appointments. The patient expressed understanding and agreed to proceed.  I discussed the assessment and treatment plan with the patient. The patient was provided an opportunity to ask questions and all were answered. The patient agreed with the plan and demonstrated an understanding of the instructions.   The patient was advised to call back or seek an in-person evaluation if the symptoms worsen or if the condition fails to improve as anticipated.  Pt was provided 240 minutes of non-face-to-face time during this encounter.   Lorin Glass, LCSW   [facility] [place] PHP THERAPIST PROGRESS NOTE  Erika [name] [id number]  Session Time: 9:00 - 10:00  Participation Level: Active  Behavioral Response: CasualAlertDepressed  Type of Therapy: Group Therapy  Treatment Goals addressed: Coping  Progress Towards Goals: Progressing  Interventions: CBT, DBT, Supportive, and Reframing  Summary: Clinician led check-in regarding current stressors and situation, and review of patient completed daily inventory. Clinician utilized active listening and empathetic response and validated patient emotions. Clinician facilitated processing group on pertinent issues.?    Summary: Erika [name] is a 66 y.o. female who presents with depression symptoms. Patient arrived within time allowed. Patient rates her mood at a 6 on a scale of 1-10 with 10 being best. Pt states she feels "tired" Pt reports she was able to participate in tasks yesterday and did some gardening, tidying, and cooked dinner. Pt reports her sleep was not restful and  she was up frequently. Pt struggles with communicating her needs. Patient able to process. Patient engaged in discussion.         Session Time: 10:00 am - 11:00 am   Participation Level: Active   Behavioral Response: CasualAlertDepressed   Type of Therapy: Group Therapy   Treatment Goals addressed: Coping   Progress Towards Goals: Progressing   Interventions: CBT, DBT, Solution Focused, Strength-based, Supportive, and Reframing   Therapist Response: Cln continued topic of DBT distress tolerance skills and the ACCEPTS distraction skill. Group reviewed P-T-S skills and discussed how they can practice them in their every day life.    Therapist Response: Pt engaged in discussion and is able to brainstorm ways to apply the skills.          Session Time: 11:00 -12:00   Participation Level: Active   Behavioral Response: CasualAlertDepressed   Type of Therapy: Group Therapy, Occupational Therapy   Treatment Goals addressed: Coping   Progress Towards Goals: Progressing   Interventions: Supportive, Education   Summary:  Occupational Therapy group led by cln E. Hollan.   Therapist Response: See OT note         Session Time: 12:00 -1:00   Participation Level: Active   Behavioral Response: CasualAlertDepressed   Type of Therapy: Group therapy   Treatment Goals addressed: Coping   Progress Towards Goals: Progressing   Interventions: CBT; Solution focused; Supportive; Reframing   Summary: 12:00 - 12:50: Cln led discussion on ways to manage stressors and feelings over the weekend. Group members  brainstormed things to do over the weekend for multiple levels of energy, access, and moods. Cln reviewed crisis services should they be needed and provided pt's  with the text crisis line, mobile crisis, national suicide hotline, [facility] 24/7 line, and information on [facility].    12:50 -1:00 Clinician led check-out. Clinician assessed for immediate needs, medication compliance  and efficacy, and safety concerns.   Therapist Response: 12:00 - 12:50: Pt engaged in discussion and is able to identify 3 ideas of what to do over the weekend to keep their mind engaged.  12:50 - 1:00 pm: At check-out, patient reports no immediate concerns. Patient demonstrates progress as evidenced by increased ability to engage in tasks. Patient denies SI/HI/self-harm thoughts at the end of group.    Suicidal/Homicidal: Nowithout intent/plan  Plan: Pt will continue in PHP while working to decrease depression symptoms, increase emotion regulation, and increase ability to manage symptoms in a healthy manner.   Collaboration of Care: Medication Management AEB T Lewis  Patient/Guardian was advised Release of Information must be obtained prior to any record release in order to collaborate their care with an outside provider. Patient/Guardian was advised if they have not already done so to contact the registration department to sign all necessary forms in order for [place] to release information regarding their care.   Consent: Patient/Guardian gives verbal consent for treatment and assignment of benefits for services provided during this visit. Patient/Guardian expressed understanding and agreed to proceed.   Diagnosis: Severe episode of recurrent major depressive disorder, without psychotic features ([place]) [F33.2]    1. Severe episode of recurrent major depressive disorder, without psychotic features ([place])   2. Difficulty coping       Lorin Glass, LCSW

## 2022-01-10 NOTE — Psych (Signed)
Virtual Visit via Video Note  I connected with Erika [name] on 08/30/21 at  9:00 AM EDT by a video enabled telemedicine application and verified that I am speaking with the correct person using two identifiers.  Location: Patient: patient home Provider: clinical home office   I discussed the limitations of evaluation and management by telemedicine and the availability of in person appointments. The patient expressed understanding and agreed to proceed.  I discussed the assessment and treatment plan with the patient. The patient was provided an opportunity to ask questions and all were answered. The patient agreed with the plan and demonstrated an understanding of the instructions.   The patient was advised to call back or seek an in-person evaluation if the symptoms worsen or if the condition fails to improve as anticipated.  Pt was provided 240 minutes of non-face-to-face time during this encounter.   Lorin Glass, LCSW   [facility] [place] PHP THERAPIST PROGRESS NOTE  Erika [name] [id number]  Session Time: 9:00 - 10:00  Participation Level: Active  Behavioral Response: CasualAlertDepressed  Type of Therapy: Group Therapy  Treatment Goals addressed: Coping  Progress Towards Goals: Initial  Interventions: CBT, DBT, Supportive, and Reframing  Summary: Clinician led check-in regarding current stressors and situation, and review of patient completed daily inventory. Clinician utilized active listening and empathetic response and validated patient emotions. Clinician facilitated processing group on pertinent issues.?    Summary: Erika [name] is a 66 y.o. female who presents with depression symptoms. Patient arrived within time allowed. Patient rates her mood at a 8 on a scale of 1-10 with 10 being best. Pt states she feels "pretty good" Pt reports waking up on her own today which helped. Pt states she was able to do "just barely some chores." Pt reports continued stress eating due to  feeling lonely and sad. Patient able to process. Patient engaged in discussion.         Session Time: 10:00 am - 11:00 am   Participation Level: Active   Behavioral Response: CasualAlertDepressed   Type of Therapy: Group Therapy   Treatment Goals addressed: Coping   Progress Towards Goals: Progressing   Interventions: CBT, DBT, Solution Focused, Strength-based, Supportive, and Reframing   Therapist Response:  Cln led processing group for pt's current struggles. Group members shared stressors and provided support and feedback. Cln brought in topics of boundaries, healthy relationships, and unhealthy thought processes to inform discussion.   Therapist Response: Pt able to process and provide support to group.            Session Time: 11:00 -12:00   Participation Level: Active   Behavioral Response: CasualAlertDepressed   Type of Therapy: Group Therapy, Spiritual Care   Treatment Goals addressed: Coping   Progress Towards Goals: Progressing   Interventions: Supportive, Education   Summary:  Alain Marion, Chaplain, led group.   Therapist Response: Pt participated        Session Time: 12:00 -1:00   Participation Level: Active   Behavioral Response: CasualAlertDepressed   Type of Therapy: Group Therapy, OT   Treatment Goals addressed: Coping   Progress Towards Goals: Progressing   Interventions: Supportive, Education   Summary:  OT, Cornell Barman, led group. 12:50 -1:00 Clinician led check-out. Clinician assessed for immediate needs, medication compliance and efficacy, and safety concerns   Therapist Response: 12:00 - 12:50: See OT note  12:50 - 1:00 pm: At check-out, patient reports no immediate concerns. Patient demonstrates progress as evidenced by improved sleep. Patient denies  SI/HI/self-harm thoughts at the end of group.     Suicidal/Homicidal: Nowithout intent/plan  Plan: Pt will continue in PHP while working to decrease depression symptoms,  increase emotion regulation, and increase ability to manage symptoms in a healthy manner.   Collaboration of Care: Medication Management AEB T Lewis  Patient/Guardian was advised Release of Information must be obtained prior to any record release in order to collaborate their care with an outside provider. Patient/Guardian was advised if they have not already done so to contact the registration department to sign all necessary forms in order for [place] to release information regarding their care.   Consent: Patient/Guardian gives verbal consent for treatment and assignment of benefits for services provided during this visit. Patient/Guardian expressed understanding and agreed to proceed.   Diagnosis: Severe episode of recurrent major depressive disorder, without psychotic features ([place]) [F33.2]    1. Severe episode of recurrent major depressive disorder, without psychotic features ([place])       Lorin Glass, LCSW

## 2022-01-11 ENCOUNTER — Ambulatory Visit: Payer: Medicare Other

## 2022-01-12 NOTE — Psych (Signed)
Virtual Visit via Video Note  I connected with Erika [name] on 09/07/21 at  9:00 AM EDT by a video enabled telemedicine application and verified that I am speaking with the correct person using two identifiers.  Location: Patient: patient home Provider: clinical home office   I discussed the limitations of evaluation and management by telemedicine and the availability of in person appointments. The patient expressed understanding and agreed to proceed.  I discussed the assessment and treatment plan with the patient. The patient was provided an opportunity to ask questions and all were answered. The patient agreed with the plan and demonstrated an understanding of the instructions.   The patient was advised to call back or seek an in-person evaluation if the symptoms worsen or if the condition fails to improve as anticipated.  Pt was provided 240 minutes of non-face-to-face time during this encounter.   Lorin Glass, LCSW   [facility] [place] PHP THERAPIST PROGRESS NOTE  Erika [name] [id number]  Session Time: 9:00 - 10:00  Participation Level: Active  Behavioral Response: CasualAlertDepressed  Type of Therapy: Group Therapy  Treatment Goals addressed: Coping  Progress Towards Goals: Progressing  Interventions: CBT, DBT, Supportive, and Reframing  Summary: Clinician led check-in regarding current stressors and situation, and review of patient completed daily inventory. Clinician utilized active listening and empathetic response and validated patient emotions. Clinician facilitated processing group on pertinent issues.?    Summary: Erika [name] is a 66 y.o. female who presents with depression symptoms. Patient arrived within time allowed. Patient rates her mood at a 6 on a scale of 1-10 with 10 being best. Pt states she feels "anxious." Pt she drove with her family to the beach yesterday and it was "stressful." Pt reports feeling taken advantage of and unsupported. Pt reports poor  sleep due to anxiety. Pt struggles to make herself a priority. Patient able to process. Patient engaged in discussion.         Session Time: 10:00 am - 11:00 am   Participation Level: Active   Behavioral Response: CasualAlertDepressed   Type of Therapy: Group Therapy   Treatment Goals addressed: Coping   Progress Towards Goals: Progressing   Interventions: CBT, DBT, Solution Focused, Strength-based, Supportive, and Reframing   Therapist Response:  Cln led discussion on accountability and the balance between taking responsibility and not beating ourselves up. Group members shared current consequences they are dealing with and how they are processing them. Cln encouraged pt's to utilize the Best Friend Test to make it easier to offer themselves kindness.    Therapist Response: Pt engaged in discussion and is able to process.            Session Time: 11:00 -12:00   Participation Level: Active   Behavioral Response: CasualAlertDepressed   Type of Therapy: Group Therapy, Occupational Therapy   Treatment Goals addressed: Coping   Progress Towards Goals: Progressing   Interventions: Supportive, Education   Summary:  Occupational Therapy group led by cln E. Hollan.   Therapist Response: See OT note         Session Time: 12:00 -1:00   Participation Level: Active   Behavioral Response: CasualAlertDepressed   Type of Therapy: Group therapy   Treatment Goals addressed: Coping   Progress Towards Goals: Progressing   Interventions: CBT; Solution focused; Supportive; Reframing   Summary: 12:00 - 12:50: Cln continued topic of boundaries and led a "boundary workshop" in which group members brought current boundary issues and group worked together to apply boundary  concepts to help address the concern. Cln helped shape conversation to maintain fidelity.  12:50 -1:00 Clinician led check-out. Clinician assessed for immediate needs, medication compliance and efficacy, and  safety concerns.   Therapist Response: 12:00 - 12:50: Pt engaged in discussion and shared a current boundary issue and reports gaining insight.  12:50 - 1:00 pm: At check-out, patient reports no immediate concerns. Patient demonstrates progress as evidenced by increased awareness. Patient denies SI/HI/self-harm thoughts at the end of group.     Suicidal/Homicidal: Nowithout intent/plan  Plan: Pt will continue in PHP while working to decrease depression symptoms, increase emotion regulation, and increase ability to manage symptoms in a healthy manner.   Collaboration of Care: Medication Management AEB T Lewis  Patient/Guardian was advised Release of Information must be obtained prior to any record release in order to collaborate their care with an outside provider. Patient/Guardian was advised if they have not already done so to contact the registration department to sign all necessary forms in order for [place] to release information regarding their care.   Consent: Patient/Guardian gives verbal consent for treatment and assignment of benefits for services provided during this visit. Patient/Guardian expressed understanding and agreed to proceed.   Diagnosis: Severe episode of recurrent major depressive disorder, without psychotic features ([place]) [F33.2]    1. Severe episode of recurrent major depressive disorder, without psychotic features ([place])       Lorin Glass, LCSW

## 2022-01-12 NOTE — Psych (Signed)
Virtual Visit via Video Note  I connected with [name] on 09/04/21 at  9:00 AM EDT by a video enabled telemedicine application and verified that I am speaking with the correct person using two identifiers.  Location: Patient: patient home Provider: clinical home office   I discussed the limitations of evaluation and management by telemedicine and the availability of in person appointments. The patient expressed understanding and agreed to proceed.  I discussed the assessment and treatment plan with the patient. The patient was provided an opportunity to ask questions and all were answered. The patient agreed with the plan and demonstrated an understanding of the instructions.   The patient was advised to call back or seek an in-person evaluation if the symptoms worsen or if the condition fails to improve as anticipated.  Pt was provided 240 minutes of non-face-to-face time during this encounter.   Lorin Glass, LCSW   [facility] [place] PHP THERAPIST PROGRESS NOTE  ARANZA GEDDES [id number]  Session Time: 9:00 - 10:00  Participation Level: Active  Behavioral Response: CasualAlertDepressed  Type of Therapy: Group Therapy  Treatment Goals addressed: Coping  Progress Towards Goals: Progressing  Interventions: CBT, DBT, Supportive, and Reframing  Summary: Clinician led check-in regarding current stressors and situation, and review of patient completed daily inventory. Clinician utilized active listening and empathetic response and validated patient emotions. Clinician facilitated processing group on pertinent issues.?    Summary: JOUA BAKE is a 66 y.o. female who presents with depression symptoms. Patient arrived within time allowed. Patient rates her mood at a 7 on a scale of 1-10 with 10 being best. Pt states she feels "okay" Pt reports her weekend was difficult and she struggled with guilt after an incident with her granddaughter and an "anger moment." Pt shares difficulty  setting limits. Pt reports difficulty sleeping. Pt identifies experiencing passive SI on Saturday and utilized distraction skills to manage. Patient able to process. Patient engaged in discussion.         Session Time: 10:00 am - 11:00 am   Participation Level: Active   Behavioral Response: CasualAlertDepressed   Type of Therapy: Group Therapy   Treatment Goals addressed: Coping   Progress Towards Goals: Progressing   Interventions: CBT, DBT, Solution Focused, Strength-based, Supportive, and Reframing   Therapist Response:  Cln introduced topic of stress management and the model of the "4 A's of stress management:" avoid, alter, accept, and adapt. Group members worked through Advice worker and discussed barriers to utilizing the 4 A's for stressors.    Therapist Response: Pt engaged in discussion and reports understanding of how to utilize the 4 A's.            Session Time: 11:00 -12:00   Participation Level: Active   Behavioral Response: CasualAlertDepressed   Type of Therapy: Group Therapy, Occupational Therapy   Treatment Goals addressed: Coping   Progress Towards Goals: Progressing   Interventions: Supportive, Education   Summary:  Occupational Therapy group led by cln E. Hollan.   Therapist Response: See OT note         Session Time: 12:00 -1:00   Participation Level: Active   Behavioral Response: CasualAlertDepressed   Type of Therapy: Group therapy   Treatment Goals addressed: Coping   Progress Towards Goals: Progressing   Interventions: CBT; Solution focused; Supportive; Reframing   Summary: 12:00 - 12:50: Cln introduced topic of boundaries. Cln discussed how boundaries inform our relationships and affect self-esteem and personal agency. Group discussed the three types of boundaries:  rigid, porous, and healthy and when each type is most helpful/harmful.    12:50 -1:00 Clinician led check-out. Clinician assessed for immediate needs, medication  compliance and efficacy, and safety concerns.   Therapist Response: 12:00 - 12:50: Pt engaged in discussion and identifies situations in which they've been in different boundary states. 12:50 - 1:00 pm: At check-out, patient reports no immediate concerns. Patient demonstrates progress as evidenced by increased ability to identify feelings. Patient denies SI/HI/self-harm thoughts at the end of group.    Suicidal/Homicidal: Nowithout intent/plan  Plan: Pt will continue in PHP while working to decrease depression symptoms, increase emotion regulation, and increase ability to manage symptoms in a healthy manner.   Collaboration of Care: Medication Management AEB T Lewis  Patient/Guardian was advised Release of Information must be obtained prior to any record release in order to collaborate their care with an outside provider. Patient/Guardian was advised if they have not already done so to contact the registration department to sign all necessary forms in order for [place] to release information regarding their care.   Consent: Patient/Guardian gives verbal consent for treatment and assignment of benefits for services provided during this visit. Patient/Guardian expressed understanding and agreed to proceed.   Diagnosis: Severe episode of recurrent major depressive disorder, without psychotic features ([place]) [F33.2]    1. Severe episode of recurrent major depressive disorder, without psychotic features ([place])       Lorin Glass, LCSW

## 2022-01-12 NOTE — Psych (Signed)
Virtual Visit via Video Note  I connected with [name] on 09/12/21 at  9:00 AM EDT by a video enabled telemedicine application and verified that I am speaking with the correct person using two identifiers.  Location: Patient: patient home Provider: clinical home office   I discussed the limitations of evaluation and management by telemedicine and the availability of in person appointments. The patient expressed understanding and agreed to proceed.  I discussed the assessment and treatment plan with the patient. The patient was provided an opportunity to ask questions and all were answered. The patient agreed with the plan and demonstrated an understanding of the instructions.   The patient was advised to call back or seek an in-person evaluation if the symptoms worsen or if the condition fails to improve as anticipated.  Pt was provided 240 minutes of non-face-to-face time during this encounter.   Lorin Glass, LCSW   [facility] [place] PHP THERAPIST PROGRESS NOTE  CYBELE [name] [id number]  Session Time: 9:00 - 10:00  Participation Level: Active  Behavioral Response: CasualAlertDepressed  Type of Therapy: Group Therapy  Treatment Goals addressed: Coping  Progress Towards Goals: Progressing  Interventions: CBT, DBT, Supportive, and Reframing  Summary: Clinician led check-in regarding current stressors and situation, and review of patient completed daily inventory. Clinician utilized active listening and empathetic response and validated patient emotions. Clinician facilitated processing group on pertinent issues.?    Summary: Erika [name] is a 66 y.o. female who presents with depression symptoms. Patient arrived within time allowed. Patient rates her mood at a 7 on a scale of 1-10 with 10 being best. Pt states she feels "okay." Pt reports she thinks her medication is starting to work better and she slept 10 hours last night. Pt reports spending the weekend with family and it  was draining and she sought alone time when overwhelmed. Pt reports struggling with setting limits. Patient able to process. Patient engaged in discussion.         Session Time: 10:00 am - 11:00 am   Participation Level: Active   Behavioral Response: CasualAlertDepressed   Type of Therapy: Group Therapy   Treatment Goals addressed: Coping   Progress Towards Goals: Progressing   Interventions: CBT, DBT, Solution Focused, Strength-based, Supportive, and Reframing   Therapist Response:  Cln led discussion on setting boundaries as a way to increase self-care. Group members discussed things that are stumbling blocks to them engaging in self-care and worked to determine what boundary could address that stumbling block. Group worked together to determine how to address the boundary. Cln brought in topics of boundaries, assertiveness, thought challenging, and self-care.    Therapist Response: Pt engaged in discussion and identifies a boundary that can improve their self-care.            Session Time: 11:00 -12:00   Participation Level: Active   Behavioral Response: CasualAlertDepressed   Type of Therapy: Group Therapy, Occupational Therapy   Treatment Goals addressed: Coping   Progress Towards Goals: Progressing   Interventions: Supportive, Education   Summary:  Occupational Therapy group led by cln E. Hollan.   Therapist Response: See OT note         Session Time: 12:00 -1:00   Participation Level: Active   Behavioral Response: CasualAlertDepressed   Type of Therapy: Group therapy   Treatment Goals addressed: Coping   Progress Towards Goals: Progressing   Interventions: CBT; Solution focused; Supportive; Reframing   Summary: 12:00 - 12:50: Cln introduced grounding techniques as a  coping strategy. Cln utilized handout "Detaching from emotional pain" from [employer]. Group reviewed grounding strategies and how they can apply them to their every day life  and in which situations.  12:50 -1:00 Clinician led check-out. Clinician assessed for immediate needs, medication compliance and efficacy, and safety concerns.   Therapist Response: 12:00 - 12:50: Pt engaged in discussion and is able to identify ways to utilize the techniques.  12:50 - 1:00 pm: At check-out, patient reports no immediate concerns. Patient demonstrates progress as evidenced by applying what is talked about in group. Patient denies SI/HI/self-harm thoughts at the end of group.    Suicidal/Homicidal: Nowithout intent/plan  Plan: Pt will continue in PHP while working to decrease depression symptoms, increase emotion regulation, and increase ability to manage symptoms in a healthy manner.   Collaboration of Care: Medication Management AEB T Lewis  Patient/Guardian was advised Release of Information must be obtained prior to any record release in order to collaborate their care with an outside provider. Patient/Guardian was advised if they have not already done so to contact the registration department to sign all necessary forms in order for [place] to release information regarding their care.   Consent: Patient/Guardian gives verbal consent for treatment and assignment of benefits for services provided during this visit. Patient/Guardian expressed understanding and agreed to proceed.   Diagnosis: Severe episode of recurrent major depressive disorder, without psychotic features ([place]) [F33.2]    1. Severe episode of recurrent major depressive disorder, without psychotic features ([place])   2. Difficulty coping       Lorin Glass, LCSW

## 2022-01-12 NOTE — Psych (Signed)
Virtual Visit via Video Note  I connected with Erika [name] on 09/14/21 at  9:00 AM EDT by a video enabled telemedicine application and verified that I am speaking with the correct person using two identifiers.  Location: Patient: patient home Provider: clinical home office   I discussed the limitations of evaluation and management by telemedicine and the availability of in person appointments. The patient expressed understanding and agreed to proceed.  I discussed the assessment and treatment plan with the patient. The patient was provided an opportunity to ask questions and all were answered. The patient agreed with the plan and demonstrated an understanding of the instructions.   The patient was advised to call back or seek an in-person evaluation if the symptoms worsen or if the condition fails to improve as anticipated.  Pt was provided 240 minutes of non-face-to-face time during this encounter.   Lorin Glass, LCSW   [facility] PHP THERAPIST PROGRESS NOTE  Erika [name] [id number]  Session Time: 9:00 - 10:00  Participation Level: Active  Behavioral Response: CasualAlertDepressed  Type of Therapy: Group Therapy  Treatment Goals addressed: Coping  Progress Towards Goals: Progressing  Interventions: CBT, DBT, Supportive, and Reframing  Summary: Clinician led check-in regarding current stressors and situation, and review of patient completed daily inventory. Clinician utilized active listening and empathetic response and validated patient emotions. Clinician facilitated processing group on pertinent issues.?    Summary: Erika [name] is a 66 y.o. female who presents with depression symptoms. Patient arrived within time allowed. Patient rates her mood at a 5 on a scale of 1-10 with 10 being best. Pt states she feels "grumpy." Pt reports she "woke up on the wrong side of the bed." Pt reports her mom was on her mind yesterday which led to rumination on family issues and hurt.  Pt reports difficulty with using coping strategies during this time. Pt reports rumination impaired sleep. Patient able to process. Patient engaged in discussion.         Session Time: 10:00 am - 11:00 am   Participation Level: Active   Behavioral Response: CasualAlertDepressed   Type of Therapy: Group Therapy   Treatment Goals addressed: Coping   Progress Towards Goals: Progressing   Interventions: CBT, DBT, Solution Focused, Strength-based, Supportive, and Reframing   Therapist Response: Cln led discussion on the 5 stages of grief and the way loss affects [place]. Cln encouraged pt to consider grief as a journey to accepting a new future. Cln created space for pt to process grief concerns and validated pt's experiences.    Therapist Response:  Pt engaged in discussion and was able to identify areas of loss and process.            Session Time: 11:00 -12:00   Participation Level: Active   Behavioral Response: CasualAlertDepressed   Type of Therapy: Group Therapy, Occupational Therapy   Treatment Goals addressed: Coping   Progress Towards Goals: Progressing   Interventions: Supportive, Education   Summary:  Occupational Therapy group led by cln E. Hollan.   Therapist Response: See OT note         Session Time: 12:00 -1:00   Participation Level: Active   Behavioral Response: CasualAlertDepressed   Type of Therapy: Group therapy   Treatment Goals addressed: Coping   Progress Towards Goals: Progressing   Interventions: CBT; Solution focused; Supportive; Reframing   Summary: 12:00 - 12:50: Cln led discussion on "why" and "what now" in terms of how we focus on our problems.  Group members shared how focus on "why" has impacted them and barriers to focusing on "what now." Group able to process. Cln encouraged pt's to consider what "why" accomplishes.  12:50 -1:00 Clinician led check-out. Clinician assessed for immediate needs, medication compliance and efficacy, and  safety concerns.   Therapist Response: 12:00 - 12:50: Pt engaged in discussion. 12:50 - 1:00 pm: At check-out, patient reports no immediate concerns. Patient demonstrates progress as evidenced by increased ownership of feelings. Patient denies SI/HI/self-harm thoughts at the end of group.     Suicidal/Homicidal: Nowithout intent/plan  Plan: Pt will continue in PHP while working to decrease depression symptoms, increase emotion regulation, and increase ability to manage symptoms in a healthy manner.   Collaboration of Care: Medication Management AEB T Lewis  Patient/Guardian was advised Release of Information must be obtained prior to any record release in order to collaborate their care with an outside provider. Patient/Guardian was advised if they have not already done so to contact the registration department to sign all necessary forms in order for [place] to release information regarding their care.   Consent: Patient/Guardian gives verbal consent for treatment and assignment of benefits for services provided during this visit. Patient/Guardian expressed understanding and agreed to proceed.   Diagnosis: Severe episode of recurrent major depressive disorder, without psychotic features ([place]) [F33.2]    1. Severe episode of recurrent major depressive disorder, without psychotic features ([place])       Lorin Glass, LCSW

## 2022-01-12 NOTE — Psych (Signed)
Virtual Visit via Video Note  I connected with Erika [name] on 09/06/21 at  9:00 AM EDT by a video enabled telemedicine application and verified that I am speaking with the correct person using two identifiers.  Location: Patient: patient home Provider: clinical home office   I discussed the limitations of evaluation and management by telemedicine and the availability of in person appointments. The patient expressed understanding and agreed to proceed.  I discussed the assessment and treatment plan with the patient. The patient was provided an opportunity to ask questions and all were answered. The patient agreed with the plan and demonstrated an understanding of the instructions.   The patient was advised to call back or seek an in-person evaluation if the symptoms worsen or if the condition fails to improve as anticipated.  Pt was provided 240 minutes of non-face-to-face time during this encounter.   Lorin Glass, LCSW   [facility] PHP THERAPIST PROGRESS NOTE  Erika [name] [id number]  Session Time: 9:00 - 10:00  Participation Level: Active  Behavioral Response: CasualAlertDepressed  Type of Therapy: Group Therapy  Treatment Goals addressed: Coping  Progress Towards Goals: Progressing  Interventions: CBT, DBT, Supportive, and Reframing  Summary: Clinician led check-in regarding current stressors and situation, and review of patient completed daily inventory. Clinician utilized active listening and empathetic response and validated patient emotions. Clinician facilitated processing group on pertinent issues.?    Summary: Erika [name] is a 66 y.o. female who presents with depression symptoms. Patient arrived within time allowed. Patient rates her mood at a 5 on a scale of 1-10 with 10 being best. Pt states she feels "frustrated." Pt reports sleep troubles due to rumination. Pt reports feeling overwhelmed by packing for family and feeling taken advantage of. Patient able to  process. Patient engaged in discussion.         Session Time: 10:00 am - 11:00 am   Participation Level: Active   Behavioral Response: CasualAlertDepressed   Type of Therapy: Group Therapy   Treatment Goals addressed: Coping   Progress Towards Goals: Progressing   Interventions: CBT, DBT, Solution Focused, Strength-based, Supportive, and Reframing   Therapist Response:  Cln led processing group for pt's current struggles. Group members shared stressors and provided support and feedback. Cln brought in topics of boundaries, healthy relationships, and unhealthy thought processes to inform discussion.   Therapist Response: Pt able to process and provide support to group.            Session Time: 11:00 -12:00   Participation Level: Active   Behavioral Response: CasualAlertDepressed   Type of Therapy: Group Therapy, Spiritual Care   Treatment Goals addressed: Coping   Progress Towards Goals: Progressing   Interventions: Supportive, Education   Summary:  Alain Marion, Chaplain, led group.   Therapist Response: Pt participated        Session Time: 12:00 -1:00   Participation Level: Active   Behavioral Response: CasualAlertDepressed   Type of Therapy: Group Therapy, OT   Treatment Goals addressed: Coping   Progress Towards Goals: Progressing   Interventions: Supportive, Education   Summary:  OT, Cornell Barman, led group. 12:50 -1:00 Clinician led check-out. Clinician assessed for immediate needs, medication compliance and efficacy, and safety concerns   Therapist Response: 12:00 - 12:50: See OT note  12:50 - 1:00 pm: At check-out, patient reports no immediate concerns. Patient demonstrates progress as evidenced by increased insight. Patient denies SI/HI/self-harm thoughts at the end of group.     Suicidal/Homicidal: Nowithout  intent/plan  Plan: Pt will continue in PHP while working to decrease depression symptoms, increase emotion regulation, and  increase ability to manage symptoms in a healthy manner.   Collaboration of Care: Medication Management AEB T Lewis  Patient/Guardian was advised Release of Information must be obtained prior to any record release in order to collaborate their care with an outside provider. Patient/Guardian was advised if they have not already done so to contact the registration department to sign all necessary forms in order for [place] to release information regarding their care.   Consent: Patient/Guardian gives verbal consent for treatment and assignment of benefits for services provided during this visit. Patient/Guardian expressed understanding and agreed to proceed.   Diagnosis: Severe episode of recurrent major depressive disorder, without psychotic features ([place]) [F33.2]    1. Severe episode of recurrent major depressive disorder, without psychotic features ([place])       Lorin Glass, LCSW

## 2022-01-12 NOTE — Psych (Signed)
Virtual Visit via Video Note  I connected with [name] on 09/15/21 at  9:00 AM EDT by a video enabled telemedicine application and verified that I am speaking with the correct person using two identifiers.  Location: Patient: patient home Provider: clinical home office   I discussed the limitations of evaluation and management by telemedicine and the availability of in person appointments. The patient expressed understanding and agreed to proceed.  I discussed the assessment and treatment plan with the patient. The patient was provided an opportunity to ask questions and all were answered. The patient agreed with the plan and demonstrated an understanding of the instructions.   The patient was advised to call back or seek an in-person evaluation if the symptoms worsen or if the condition fails to improve as anticipated.  Pt was provided 240 minutes of non-face-to-face time during this encounter.   Lorin Glass, LCSW   [facility] [place] PHP THERAPIST PROGRESS NOTE  JAMARI DIANA [id number]  Session Time: 9:00 - 10:00  Participation Level: Active  Behavioral Response: CasualAlertDepressed  Type of Therapy: Group Therapy  Treatment Goals addressed: Coping  Progress Towards Goals: Progressing  Interventions: CBT, DBT, Supportive, and Reframing  Summary: Clinician led check-in regarding current stressors and situation, and review of patient completed daily inventory. Clinician utilized active listening and empathetic response and validated patient emotions. Clinician facilitated processing group on pertinent issues.?    Summary: SALLYANNE BIRKHEAD is a 66 y.o. female who presents with depression symptoms. Patient arrived within time allowed. Patient rates her mood at a 8 on a scale of 1-10 with 10 being best. Pt states she feels "glad to be in group." Pt reports yesterday was "rough" and she had big feelings about her relationships. Pt states she attempted to distract herself and was  able to get to a calmer mental space. Pt reports working on accepting what she can't change. Patient able to process. Patient engaged in discussion.         Session Time: 10:00 am - 11:00 am   Participation Level: Active   Behavioral Response: CasualAlertDepressed   Type of Therapy: Group Therapy   Treatment Goals addressed: Coping   Progress Towards Goals: Progressing   Interventions: CBT, DBT, Solution Focused, Strength-based, Supportive, and Reframing   Therapist Response: Cln led discussion on planning ahead as a way to mitigate anxiety. Group members shared worries about keeping up good habits when returning to "normal" life post-treatment. Group able to brainstorm ways to build habits now and how they can fit into their life after treatment.   Therapist Response:  Pt engaged in discussion and identified ways to plan ahead for anxieties.            Session Time: 11:00 -12:00   Participation Level: Active   Behavioral Response: CasualAlertDepressed   Type of Therapy: Group Therapy, Occupational Therapy   Treatment Goals addressed: Coping   Progress Towards Goals: Progressing   Interventions: Supportive, Education   Summary:  Occupational Therapy group led by cln E. Hollan.   Therapist Response: See OT note         Session Time: 12:00 -1:00   Participation Level: Active   Behavioral Response: CasualAlertDepressed   Type of Therapy: Group therapy   Treatment Goals addressed: Coping   Progress Towards Goals: Progressing   Interventions: CBT; Solution focused; Supportive; Reframing   Summary: 12:00 - 12:50: Cln led discussion on saying "no." Group members shared struggles they have with saying no and how it  affects them. Cln utilized boundaries, communication, and self-esteem tenets to inform discussion.  12:50 -1:00 Clinician led check-out. Clinician assessed for immediate needs, medication compliance and efficacy, and safety concerns.   Therapist  Response: 12:00 - 12:50: Pt engaged in discussion and reports increased understanding of how to say "no."  12:50 - 1:00 pm: At check-out, patient reports no immediate concerns. Patient demonstrates progress as evidenced by increased use of positive coping skills. Patient denies SI/HI/self-harm thoughts at the end of group.    Suicidal/Homicidal: Nowithout intent/plan  Plan: Pt will continue in PHP while working to decrease depression symptoms, increase emotion regulation, and increase ability to manage symptoms in a healthy manner.   Collaboration of Care: Medication Management AEB T Lewis  Patient/Guardian was advised Release of Information must be obtained prior to any record release in order to collaborate their care with an outside provider. Patient/Guardian was advised if they have not already done so to contact the registration department to sign all necessary forms in order for [place] to release information regarding their care.   Consent: Patient/Guardian gives verbal consent for treatment and assignment of benefits for services provided during this visit. Patient/Guardian expressed understanding and agreed to proceed.   Diagnosis: Severe episode of recurrent major depressive disorder, without psychotic features ([place]) [F33.2]    1. Severe episode of recurrent major depressive disorder, without psychotic features ([place])       Lorin Glass, LCSW

## 2022-01-12 NOTE — Psych (Signed)
Virtual Visit via Video Note  I connected with Erika [name] on 09/13/21 at  9:00 AM EDT by a video enabled telemedicine application and verified that I am speaking with the correct person using two identifiers.  Location: Patient: patient home Provider: clinical home office   I discussed the limitations of evaluation and management by telemedicine and the availability of in person appointments. The patient expressed understanding and agreed to proceed.  I discussed the assessment and treatment plan with the patient. The patient was provided an opportunity to ask questions and all were answered. The patient agreed with the plan and demonstrated an understanding of the instructions.   The patient was advised to call back or seek an in-person evaluation if the symptoms worsen or if the condition fails to improve as anticipated.  Pt was provided 240 minutes of non-face-to-face time during this encounter.   Lorin Glass, LCSW   [facility] PHP THERAPIST PROGRESS NOTE  Erika [name] [id number]  Session Time: 9:00 - 10:00  Participation Level: Active  Behavioral Response: CasualAlertDepressed  Type of Therapy: Group Therapy  Treatment Goals addressed: Coping  Progress Towards Goals: Progressing  Interventions: CBT, DBT, Supportive, and Reframing  Summary: Clinician led check-in regarding current stressors and situation, and review of patient completed daily inventory. Clinician utilized active listening and empathetic response and validated patient emotions. Clinician facilitated processing group on pertinent issues.?    Summary: Erika [name] is a 66 y.o. female who presents with depression symptoms. Patient arrived within time allowed. Patient rates her mood at a 8 on a scale of 1-10 with 10 being best. Pt states she feels "good." Pt reports she spent yesterday with her grandson and enjoyed it. Pt reports sleeping well. Patient able to process. Patient engaged in discussion.          Session Time: 10:00 am - 11:00 am   Participation Level: Active   Behavioral Response: CasualAlertDepressed   Type of Therapy: Group Therapy   Treatment Goals addressed: Coping   Progress Towards Goals: Progressing   Interventions: CBT, DBT, Solution Focused, Strength-based, Supportive, and Reframing   Therapist Response:  Cln led processing group for pt's current struggles. Group members shared stressors and provided support and feedback. Cln brought in topics of boundaries, healthy relationships, and unhealthy thought processes to inform discussion.   Therapist Response: Pt able to process and provide support to group.            Session Time: 11:00 -12:00   Participation Level: Active   Behavioral Response: CasualAlertDepressed   Type of Therapy: Group Therapy, Spiritual Care   Treatment Goals addressed: Coping   Progress Towards Goals: Progressing   Interventions: Supportive, Education   Summary:  Erika [name], Chaplain, led group.   Therapist Response: Pt participated        Session Time: 12:00 -1:00   Participation Level: Active   Behavioral Response: CasualAlertDepressed   Type of Therapy: Group Therapy, OT   Treatment Goals addressed: Coping   Progress Towards Goals: Progressing   Interventions: Supportive, Education   Summary:  OT, Cornell Barman, led group. 12:50 -1:00 Clinician led check-out. Clinician assessed for immediate needs, medication compliance and efficacy, and safety concerns   Therapist Response: 12:00 - 12:50: See OT note  12:50 - 1:00 pm: At check-out, patient reports no immediate concerns. Patient demonstrates progress as evidenced by applying what is discussed in group.. Patient denies SI/HI/self-harm thoughts at the end of group.     Suicidal/Homicidal: Nowithout intent/plan  Plan: Pt will continue in PHP while working to decrease depression symptoms, increase emotion regulation, and increase ability to manage  symptoms in a healthy manner.   Collaboration of Care: Medication Management AEB T Lewis  Patient/Guardian was advised Release of Information must be obtained prior to any record release in order to collaborate their care with an outside provider. Patient/Guardian was advised if they have not already done so to contact the registration department to sign all necessary forms in order for [place] to release information regarding their care.   Consent: Patient/Guardian gives verbal consent for treatment and assignment of benefits for services provided during this visit. Patient/Guardian expressed understanding and agreed to proceed.   Diagnosis: Severe episode of recurrent major depressive disorder, without psychotic features ([place]) [F33.2]    1. Severe episode of recurrent major depressive disorder, without psychotic features ([place])       Lorin Glass, LCSW

## 2022-01-12 NOTE — Psych (Signed)
Virtual Visit via Video Note  I connected with Erika [name] on 09/18/21 at  9:00 AM EDT by a video enabled telemedicine application and verified that I am speaking with the correct person using two identifiers.  Location: Patient: patient home Provider: clinical home office   I discussed the limitations of evaluation and management by telemedicine and the availability of in person appointments. The patient expressed understanding and agreed to proceed.  I discussed the assessment and treatment plan with the patient. The patient was provided an opportunity to ask questions and all were answered. The patient agreed with the plan and demonstrated an understanding of the instructions.   The patient was advised to call back or seek an in-person evaluation if the symptoms worsen or if the condition fails to improve as anticipated.  Pt was provided 240 minutes of non-face-to-face time during this encounter.   Lorin Glass, LCSW   [facility] [place] PHP THERAPIST PROGRESS NOTE  Erika [name] [id number]  Session Time: 9:00 - 10:00  Participation Level: Active  Behavioral Response: CasualAlertDepressed  Type of Therapy: Group Therapy  Treatment Goals addressed: Coping  Progress Towards Goals: Progressing  Interventions: CBT, DBT, Supportive, and Reframing  Summary: Clinician led check-in regarding current stressors and situation, and review of patient completed daily inventory. Clinician utilized active listening and empathetic response and validated patient emotions. Clinician facilitated processing group on pertinent issues.?    Summary: Erika [name] is a 66 y.o. female who presents with depression symptoms. Patient arrived within time allowed. Patient rates her mood at a 7 on a scale of 1-10 with 10 being best. Pt states she feels "pretty good." Pt reports she woke up early and with energy. Pt states she did laundry and some cleaning she hadn't had the motivation for. Pt reports the  weekend was overall okay however faced struggles with her son. Pt reports feeling upset and conflicted about their relationship. Patient able to process. Patient engaged in discussion.         Session Time: 10:00 am - 11:00 am   Participation Level: Active   Behavioral Response: CasualAlertDepressed   Type of Therapy: Group Therapy   Treatment Goals addressed: Coping   Progress Towards Goals: Progressing   Interventions: CBT, DBT, Solution Focused, Strength-based, Supportive, and Reframing   Therapist Response: Cln led discussion on rumination: and how it can affect [place] negatively. Group members share topics, situations, and times in which rumination is most problem problematic for them. Cln encouraged pt's to consider DBT distraction and STOP skills or CBT thought challenging to manage rumination.   Therapist Response:  Pt engaged in discussion.           Session Time: 11:00 -12:00   Participation Level: Active   Behavioral Response: CasualAlertDepressed   Type of Therapy: Group Therapy, Occupational Therapy   Treatment Goals addressed: Coping   Progress Towards Goals: Progressing   Interventions: Supportive, Education   Summary:  Occupational Therapy group led by cln E. Hollan.   Therapist Response: See OT note         Session Time: 12:00 -1:00   Participation Level: Active   Behavioral Response: CasualAlertDepressed   Type of Therapy: Group therapy   Treatment Goals addressed: Coping   Progress Towards Goals: Progressing   Interventions: CBT; Solution focused; Supportive; Reframing   Summary: 12:00 - 12:50: Cln continued topic of DBT distress tolerance skills. Cln introduced TIPP skills to use during cases of extreme emotion. Group practiced deep breathing and progressive  muscle relaxation together. Group discussed which situations they can apply TIPP skills.  12:50 -1:00 Clinician led check-out. Clinician assessed for immediate needs, medication  compliance and efficacy, and safety concerns.   Therapist Response: 12:00 - 12:50: Pt engaged in discussion and practiced with group.  12:50 - 1:00 pm: At check-out, patient reports no immediate concerns. Patient demonstrates progress as evidenced by increased energy. Patient denies SI/HI/self-harm thoughts at the end of group.    Suicidal/Homicidal: Nowithout intent/plan  Plan: Pt will continue in PHP while working to decrease depression symptoms, increase emotion regulation, and increase ability to manage symptoms in a healthy manner.   Collaboration of Care: Medication Management AEB T Lewis  Patient/Guardian was advised Release of Information must be obtained prior to any record release in order to collaborate their care with an outside provider. Patient/Guardian was advised if they have not already done so to contact the registration department to sign all necessary forms in order for [place] to release information regarding their care.   Consent: Patient/Guardian gives verbal consent for treatment and assignment of benefits for services provided during this visit. Patient/Guardian expressed understanding and agreed to proceed.   Diagnosis: Severe episode of recurrent major depressive disorder, without psychotic features ([place]) [F33.2]    1. Severe episode of recurrent major depressive disorder, without psychotic features ([place])       Lorin Glass, LCSW

## 2022-01-12 NOTE — Psych (Signed)
Virtual Visit via Video Note  I connected with Erika [name] on 09/19/21 at  9:00 AM EDT by a video enabled telemedicine application and verified that I am speaking with the correct person using two identifiers.  Location: Patient: patient home Provider: clinical home office   I discussed the limitations of evaluation and management by telemedicine and the availability of in person appointments. The patient expressed understanding and agreed to proceed.  I discussed the assessment and treatment plan with the patient. The patient was provided an opportunity to ask questions and all were answered. The patient agreed with the plan and demonstrated an understanding of the instructions.   The patient was advised to call back or seek an in-person evaluation if the symptoms worsen or if the condition fails to improve as anticipated.  Pt was provided 240 minutes of non-face-to-face time during this encounter.   Lorin Glass, LCSW   [facility] PHP THERAPIST PROGRESS NOTE  Erika [name] [id number]  Session Time: 9:00 - 10:00  Participation Level: Active  Behavioral Response: CasualAlertDepressed  Type of Therapy: Group Therapy  Treatment Goals addressed: Coping  Progress Towards Goals: Progressing  Interventions: CBT, DBT, Supportive, and Reframing  Summary: Clinician led check-in regarding current stressors and situation, and review of patient completed daily inventory. Clinician utilized active listening and empathetic response and validated patient emotions. Clinician facilitated processing group on pertinent issues.?    Summary: Erika [name] is a 66 y.o. female who presents with depression symptoms. Patient arrived within time allowed. Patient rates her mood at a 9 on a scale of 1-10 with 10 being best. Pt states she feels "good." Pt reports she woke up in a good mindset. Pt states she spent the day cleaning out a room for her stepson. Pt reports setting limits with her husband  and was proud of herself for doing a hard thing. Pt struggles with feeling guilty. Patient able to process. Patient engaged in discussion.         Session Time: 10:00 am - 11:00 am   Participation Level: Active   Behavioral Response: CasualAlertDepressed   Type of Therapy: Group Therapy   Treatment Goals addressed: Coping   Progress Towards Goals: Progressing   Interventions: CBT, DBT, Solution Focused, Strength-based, Supportive, and Reframing   Therapist Response: Cln led discussion on impulsivity. Group discussed struggles with impulsivity. Cln highlighted theme of immediacy. Group built insight around the way immediacy interacts with impulsivity. Cln encouraged pt's to consider mantras and grounding statements to remind themselves there is time to think/feel/act.    Therapist Response:  Pt engaged in discussion and is able to make connections and gain insight.            Session Time: 11:00 -12:00   Participation Level: Active   Behavioral Response: CasualAlertDepressed   Type of Therapy: Group Therapy, Occupational Therapy   Treatment Goals addressed: Coping   Progress Towards Goals: Progressing   Interventions: Supportive, Education   Summary:  Occupational Therapy group led by cln E. Hollan.   Therapist Response: See OT note         Session Time: 12:00 -1:00   Participation Level: Active   Behavioral Response: CasualAlertDepressed   Type of Therapy: Group therapy   Treatment Goals addressed: Coping   Progress Towards Goals: Progressing   Interventions: CBT; Solution focused; Supportive; Reframing   Summary: 12:00 - 12:50: Cln led discussion on healthy relationships. Group members shared issues they have experienced in past relationships and in identifying  what "healthy" looks like. Cln discussed respect, trust, and honesty as non-negotiable traits in a healthy dynamic. Group shared and problem solved barriers to recognizing and priortizing these  traits.  12:50 -1:00 Clinician led check-out. Clinician assessed for immediate needs, medication compliance and efficacy, and safety concerns.   Therapist Response: 12:00 - 12:50: Pt engaged in discussion and is able to process.  12:50 - 1:00 pm: At check-out, patient reports no immediate concerns. Patient demonstrates progress as evidenced by setting a boundary. Patient denies SI/HI/self-harm thoughts at the end of group.    Suicidal/Homicidal: Nowithout intent/plan  Plan: Pt will continue in PHP while working to decrease depression symptoms, increase emotion regulation, and increase ability to manage symptoms in a healthy manner.   Collaboration of Care: Medication Management AEB T Lewis  Patient/Guardian was advised Release of Information must be obtained prior to any record release in order to collaborate their care with an outside provider. Patient/Guardian was advised if they have not already done so to contact the registration department to sign all necessary forms in order for [place] to release information regarding their care.   Consent: Patient/Guardian gives verbal consent for treatment and assignment of benefits for services provided during this visit. Patient/Guardian expressed understanding and agreed to proceed.   Diagnosis: Severe episode of recurrent major depressive disorder, without psychotic features ([place]) [F33.2]    1. Severe episode of recurrent major depressive disorder, without psychotic features ([place])       Lorin Glass, LCSW

## 2022-01-12 NOTE — Psych (Signed)
Virtual Visit via Video Note  I connected with Erika [name] on 09/05/21 at  9:00 AM EDT by a video enabled telemedicine application and verified that I am speaking with the correct person using two identifiers.  Location: Patient: patient home Provider: clinical home office   I discussed the limitations of evaluation and management by telemedicine and the availability of in person appointments. The patient expressed understanding and agreed to proceed.  I discussed the assessment and treatment plan with the patient. The patient was provided an opportunity to ask questions and all were answered. The patient agreed with the plan and demonstrated an understanding of the instructions.   The patient was advised to call back or seek an in-person evaluation if the symptoms worsen or if the condition fails to improve as anticipated.  Pt was provided 240 minutes of non-face-to-face time during this encounter.   Lorin Glass, LCSW   [facility] [place] PHP THERAPIST PROGRESS NOTE  Erika [name] [id number]  Session Time: 9:00 - 10:00  Participation Level: Active  Behavioral Response: CasualAlertDepressed  Type of Therapy: Group Therapy  Treatment Goals addressed: Coping  Progress Towards Goals: Progressing  Interventions: CBT, DBT, Supportive, and Reframing  Summary: Clinician led check-in regarding current stressors and situation, and review of patient completed daily inventory. Clinician utilized active listening and empathetic response and validated patient emotions. Clinician facilitated processing group on pertinent issues.?    Summary: Erika [name] is a 66 y.o. female who presents with depression symptoms. Patient arrived within time allowed. Patient rates her mood at a 8 on a scale of 1-10 with 10 being best. Pt states she feels "good" Pt states there were no "catastrophes" yesterday and she noted increased energy and mood consequently. Pt states running errands and sleeping 8/5 hours  last night. Pt reports continued struggle with managing and ruminating on guilt. Patient able to process. Patient engaged in discussion.         Session Time: 10:00 am - 11:00 am   Participation Level: Active   Behavioral Response: CasualAlertDepressed   Type of Therapy: Group Therapy   Treatment Goals addressed: Coping   Progress Towards Goals: Progressing   Interventions: CBT, DBT, Solution Focused, Strength-based, Supportive, and Reframing   Therapist Response: Cln continued topic of boundaries. Cln discussed emotional and intellectual boundaries including they way they present and difficulties with both. Group members discussed the ways in which emotional and intellectual boundaries is a struggle for them.    Therapist Response: Pt engaged in discussion and is able to identify ways in which emotional and intellectual boundaries affect them.           Session Time: 11:00 -12:00   Participation Level: Active   Behavioral Response: CasualAlertDepressed   Type of Therapy: Group Therapy, Occupational Therapy   Treatment Goals addressed: Coping   Progress Towards Goals: Progressing   Interventions: Supportive, Education   Summary:  Occupational Therapy group led by cln E. Hollan.   Therapist Response: See OT note         Session Time: 12:00 -1:00   Participation Level: Active   Behavioral Response: CasualAlertDepressed   Type of Therapy: Group therapy   Treatment Goals addressed: Coping   Progress Towards Goals: Progressing   Interventions: CBT; Solution focused; Supportive; Reframing   Summary: 12:00 - 12:50: Cln continued topic of boundaries. Cln discussed material and time boundaries including they way they present and difficulties with both. Group members discussed the ways in which material and time boundaries  is a struggle for them.  12:50 -1:00 Clinician led check-out. Clinician assessed for immediate needs, medication compliance and efficacy, and  safety concerns.   Therapist Response: 12:00 - 12:50: Pt engaged in discussion and is able to identify ways in which material and time boundaries affect them. 12:50 - 1:00 pm: At check-out, patient reports no immediate concerns. Patient demonstrates progress as evidenced by increased energy and sleep. Patient denies SI/HI/self-harm thoughts at the end of group.    Suicidal/Homicidal: Nowithout intent/plan  Plan: Pt will continue in PHP while working to decrease depression symptoms, increase emotion regulation, and increase ability to manage symptoms in a healthy manner.   Collaboration of Care: Medication Management AEB T Lewis  Patient/Guardian was advised Release of Information must be obtained prior to any record release in order to collaborate their care with an outside provider. Patient/Guardian was advised if they have not already done so to contact the registration department to sign all necessary forms in order for [place] to release information regarding their care.   Consent: Patient/Guardian gives verbal consent for treatment and assignment of benefits for services provided during this visit. Patient/Guardian expressed understanding and agreed to proceed.   Diagnosis: Severe episode of recurrent major depressive disorder, without psychotic features ([place]) [F33.2]    1. Severe episode of recurrent major depressive disorder, without psychotic features ([place])   2. Difficulty coping       Lorin Glass, LCSW

## 2022-01-13 NOTE — Psych (Signed)
Virtual Visit via Video Note  I connected with Erika [name] on 09/21/21 at  9:00 AM EDT by a video enabled telemedicine application and verified that I am speaking with the correct person using two identifiers.  Location: Patient: patient home Provider: clinical home office   I discussed the limitations of evaluation and management by telemedicine and the availability of in person appointments. The patient expressed understanding and agreed to proceed.  I discussed the assessment and treatment plan with the patient. The patient was provided an opportunity to ask questions and all were answered. The patient agreed with the plan and demonstrated an understanding of the instructions.   The patient was advised to call back or seek an in-person evaluation if the symptoms worsen or if the condition fails to improve as anticipated.  Pt was provided 240 minutes of non-face-to-face time during this encounter.   Lorin Glass, LCSW   [facility] [place] PHP THERAPIST PROGRESS NOTE  Erika [name] [id number]  Session Time: 9:00 - 10:00  Participation Level: Active  Behavioral Response: CasualAlertDepressed  Type of Therapy: Group Therapy  Treatment Goals addressed: Coping  Progress Towards Goals: Progressing  Interventions: CBT, DBT, Supportive, and Reframing  Summary: Clinician led check-in regarding current stressors and situation, and review of patient completed daily inventory. Clinician utilized active listening and empathetic response and validated patient emotions. Clinician facilitated processing group on pertinent issues.?    Summary: Erika [name] is a 66 y.o. female who presents with depression symptoms. Patient arrived within time allowed. Patient rates her mood at a 7 on a scale of 1-10 with 10 being best. Pt states she feels "anxious." Pt reports continued rumination re: her stepson moving in. Pt expresses concern she will not enforce her boundaries and "end up back where I  started." Pt reports anxiety impacting her sleep.  Patient able to process. Patient engaged in discussion.         Session Time: 10:00 am - 11:00 am   Participation Level: Active   Behavioral Response: CasualAlertDepressed   Type of Therapy: Group Therapy   Treatment Goals addressed: Coping   Progress Towards Goals: Progressing   Interventions: CBT, DBT, Solution Focused, Strength-based, Supportive, and Reframing   Therapist Response: Cln led discussion on feelings and the role they play for our lives. Cln contextualized feelings as a warning system that brings attention to areas of our lives that need focus. Group members discussed how to pay attention to what the feeling is telling us versus reacting to unpleasant aspects of a feeling.    Therapist Response:  Pt engaged in discussion and reports understanding.            Session Time: 11:00 -12:00   Participation Level: Active   Behavioral Response: CasualAlertDepressed   Type of Therapy: Group Therapy, Occupational Therapy   Treatment Goals addressed: Coping   Progress Towards Goals: Progressing   Interventions: Supportive, Education   Summary:  Occupational Therapy group led by cln E. Hollan.   Therapist Response: See OT note         Session Time: 12:00 -1:00   Participation Level: Active   Behavioral Response: CasualAlertDepressed   Type of Therapy: Group therapy   Treatment Goals addressed: Coping   Progress Towards Goals: Progressing   Interventions: CBT; Solution focused; Supportive; Reframing   Summary: 12:00 - 12:50: Cln introduced CBT and the way in which it can provide context for addressing stumbling blocks. Group discussed "the problem is not the problem, the problem  is how we're thinking about the problem" and tried to change perspective on current struggles.  12:50 -1:00 Clinician led check-out. Clinician assessed for immediate needs, medication compliance and efficacy, and safety  concerns.   Therapist Response: 12:00 - 12:50: Pt engaged in discussion and is able to attempt reframing using CBT.  12:50 - 1:00 pm: At check-out, patient reports no immediate concerns. Patient demonstrates progress as evidenced by attempting coping skills when overwhelmed. Patient denies SI/HI/self-harm thoughts at the end of group.    Suicidal/Homicidal: Nowithout intent/plan  Plan: Pt will continue in PHP while working to decrease depression symptoms, increase emotion regulation, and increase ability to manage symptoms in a healthy manner.   Collaboration of Care: Medication Management AEB T [place]  Patient/Guardian was advised Release of Information must be obtained prior to any record release in order to collaborate their care with an outside provider. Patient/Guardian was advised if they have not already done so to contact the registration department to sign all necessary forms in order for [place] to release information regarding their care.   Consent: Patient/Guardian gives verbal consent for treatment and assignment of benefits for services provided during this visit. Patient/Guardian expressed understanding and agreed to proceed.   Diagnosis: Severe episode of recurrent major depressive disorder, without psychotic features ([place]) [F33.2]    1. Severe episode of recurrent major depressive disorder, without psychotic features ([place])       Lorin Glass, LCSW

## 2022-01-13 NOTE — Psych (Signed)
Virtual Visit via Video Note  I connected with Erika [name] on 09/22/21 at  9:00 AM EDT by a video enabled telemedicine application and verified that I am speaking with the correct person using two identifiers.  Location: Patient: patient home Provider: clinical home office   I discussed the limitations of evaluation and management by telemedicine and the availability of in person appointments. The patient expressed understanding and agreed to proceed.  I discussed the assessment and treatment plan with the patient. The patient was provided an opportunity to ask questions and all were answered. The patient agreed with the plan and demonstrated an understanding of the instructions.   The patient was advised to call back or seek an in-person evaluation if the symptoms worsen or if the condition fails to improve as anticipated.  Pt was provided 240 minutes of non-face-to-face time during this encounter.   Lorin Glass, LCSW   [facility] [place] PHP THERAPIST PROGRESS NOTE  Erika [name] [id number]  Session Time: 9:00 - 10:00  Participation Level: Active  Behavioral Response: CasualAlertDepressed  Type of Therapy: Group Therapy  Treatment Goals addressed: Coping  Progress Towards Goals: Progressing  Interventions: CBT, DBT, Supportive, and Reframing  Summary: Clinician led check-in regarding current stressors and situation, and review of patient completed daily inventory. Clinician utilized active listening and empathetic response and validated patient emotions. Clinician facilitated processing group on pertinent issues.?    Summary: Erika [name] is a 66 y.o. female who presents with depression symptoms. Patient arrived within time allowed. Patient rates her mood at a 6 on a scale of 1-10 with 10 being best. Pt states she feels "overwhelmed." Pt reports a problem with her siblings. Pt reports feeling overwhelmed by the amount of boundaries she needs to set now that she wants to  prioritize herself. Pt reports difficulty sleeping last night. Patient able to process. Patient engaged in discussion.         Session Time: 10:00 am - 11:00 am   Participation Level: Active   Behavioral Response: CasualAlertDepressed   Type of Therapy: Group Therapy   Treatment Goals addressed: Coping   Progress Towards Goals: Progressing   Interventions: CBT, DBT, Solution Focused, Strength-based, Supportive, and Reframing   Therapist Response: Cln led discussion on family dynamics and the way in which they impact [place]. Group members shared struggles with their families and the way the patterns of behaviors have negatively impacted them. Cln provided space to process and validated pt's experiences.     Therapist Response:  Pt engaged in discussion and is able to process.         Session Time: 11:00 -12:00   Participation Level: Active   Behavioral Response: CasualAlertDepressed   Type of Therapy: Group Therapy, Occupational Therapy   Treatment Goals addressed: Coping   Progress Towards Goals: Progressing   Interventions: Supportive, Education   Summary:  Occupational Therapy group led by cln E. Hollan.   Therapist Response: See OT note         Session Time: 12:00 -1:00   Participation Level: Active   Behavioral Response: CasualAlertDepressed   Type of Therapy: Group therapy   Treatment Goals addressed: Coping   Progress Towards Goals: Progressing   Interventions: CBT; Solution focused; Supportive; Reframing   Summary: 12:00 - 12:50: Cln introduced DBT concept of radical acceptance. Cln discussed radical acceptance as a strategy to decrease distress and to manage situations outside of their control. Group volunteered struggles they are experiencing with control and cln helped group  apply radical acceptance.  12:50 -1:00 Clinician led check-out. Clinician assessed for immediate needs, medication compliance and efficacy, and safety concerns.   Therapist  Response: 12:00 - 12:50: Pt engaged in discussion and identified ways they can apply radical acceptance in their life.  12:50 - 1:00 pm: At check-out, patient reports no immediate concerns. Patient demonstrates progress as evidenced by embracing healthy habits. Patient denies SI/HI/self-harm thoughts at the end of group.    Suicidal/Homicidal: Nowithout intent/plan  Plan: Pt will discharge from PHP due to meeting treatment goals of decreased depression symptoms, increased emotion regulation, and increased ability to manage symptoms in a healthy manner. Pt will step down to regular OP therapy provider and resume medication management with her PCP per her request. Pt and provider are aligned with discharge. Pt denies SI/HI at discharge.   Collaboration of Care: Medication Management AEB T [place]  Patient/Guardian was advised Release of Information must be obtained prior to any record release in order to collaborate their care with an outside provider. Patient/Guardian was advised if they have not already done so to contact the registration department to sign all necessary forms in order for [place] to release information regarding their care.   Consent: Patient/Guardian gives verbal consent for treatment and assignment of benefits for services provided during this visit. Patient/Guardian expressed understanding and agreed to proceed.   Diagnosis: Severe episode of recurrent major depressive disorder, without psychotic features ([place]) [F33.2]    1. Severe episode of recurrent major depressive disorder, without psychotic features ([place])   2. Difficulty coping       Lorin Glass, LCSW

## 2022-01-13 NOTE — Psych (Signed)
Virtual Visit via Video Note  I connected with Erika [name] on 09/20/21 at  9:00 AM EDT by a video enabled telemedicine application and verified that I am speaking with the correct person using two identifiers.  Location: Patient: patient home Provider: clinical home office   I discussed the limitations of evaluation and management by telemedicine and the availability of in person appointments. The patient expressed understanding and agreed to proceed.  I discussed the assessment and treatment plan with the patient. The patient was provided an opportunity to ask questions and all were answered. The patient agreed with the plan and demonstrated an understanding of the instructions.   The patient was advised to call back or seek an in-person evaluation if the symptoms worsen or if the condition fails to improve as anticipated.  Pt was provided 240 minutes of non-face-to-face time during this encounter.   Lorin Glass, LCSW   [facility] [place] PHP THERAPIST PROGRESS NOTE  Erika [name] [id number]  Session Time: 9:00 - 10:00  Participation Level: Active  Behavioral Response: CasualAlertDepressed  Type of Therapy: Group Therapy  Treatment Goals addressed: Coping  Progress Towards Goals: Progressing  Interventions: CBT, DBT, Supportive, and Reframing  Summary: Clinician led check-in regarding current stressors and situation, and review of patient completed daily inventory. Clinician utilized active listening and empathetic response and validated patient emotions. Clinician facilitated processing group on pertinent issues.?    Summary: Erika [name] is a 66 y.o. female who presents with depression symptoms. Patient arrived within time allowed. Patient rates her mood at a 6 on a scale of 1-10 with 10 being best. Pt states she feels "anxious." Pt reports she is anxious re: her step-son moving in with them. Pt states she set a boundary with her husband and plans to leave if he does not  respect it. Pt reports she feels guilty and anxious, and impressed with herself. Pt reports anxiety interfered with her sleep. Patient able to process. Patient engaged in discussion.         Session Time: 10:00 am - 11:00 am   Participation Level: Active   Behavioral Response: CasualAlertDepressed   Type of Therapy: Group Therapy   Treatment Goals addressed: Coping   Progress Towards Goals: Progressing   Interventions: CBT, DBT, Solution Focused, Strength-based, Supportive, and Reframing   Therapist Response:  Cln led processing group for pt's current struggles. Group members shared stressors and provided support and feedback. Cln brought in topics of boundaries, healthy relationships, and unhealthy thought processes to inform discussion.   Therapist Response: Pt able to process and provide support to group.            Session Time: 11:00 -12:00   Participation Level: Active   Behavioral Response: CasualAlertDepressed   Type of Therapy: Group Therapy, Spiritual Care   Treatment Goals addressed: Coping   Progress Towards Goals: Progressing   Interventions: Supportive, Education   Summary:  Erika [name], Chaplain, led group.   Therapist Response: Pt participated        Session Time: 12:00 -1:00   Participation Level: Active   Behavioral Response: CasualAlertDepressed   Type of Therapy: Group Therapy   Treatment Goals addressed: Coping   Progress Towards Goals: Progressing   Interventions: CBT, DBT, Solution Focused, Strength-based, Supportive, and Reframing   Summary: Cln introduced topic of DBT Self-Soothe skills. Group discussed ways they can utilize the five senses to soothe themselves when struggling.  12:50 -1:00 Clinician led check-out. Clinician assessed for immediate needs, medication compliance  and efficacy, and safety concerns   Therapist Response: 12:00 - 12:50: Pt engaged in discussion and is able to determine ways to utilize each of the five  senses. 12:50 - 1:00 pm: At check-out, patient reports no immediate concerns. Patient demonstrates progress as evidenced by standing up for herself. Patient denies SI/HI/self-harm thoughts at the end of group.    Suicidal/Homicidal: Nowithout intent/plan  Plan: Pt will continue in PHP while working to decrease depression symptoms, increase emotion regulation, and increase ability to manage symptoms in a healthy manner.   Collaboration of Care: Medication Management AEB T Lewis  Patient/Guardian was advised Release of Information must be obtained prior to any record release in order to collaborate their care with an outside provider. Patient/Guardian was advised if they have not already done so to contact the registration department to sign all necessary forms in order for [place] to release information regarding their care.   Consent: Patient/Guardian gives verbal consent for treatment and assignment of benefits for services provided during this visit. Patient/Guardian expressed understanding and agreed to proceed.   Diagnosis: Severe episode of recurrent major depressive disorder, without psychotic features ([place]) [F33.2]    1. Severe episode of recurrent major depressive disorder, without psychotic features ([place])       Lorin Glass, LCSW

## 2022-01-17 ENCOUNTER — Ambulatory Visit (INDEPENDENT_AMBULATORY_CARE_PROVIDER_SITE_OTHER): Payer: Medicare Other | Admitting: Internal Medicine

## 2022-01-17 ENCOUNTER — Encounter: Payer: Self-pay | Admitting: Internal Medicine

## 2022-01-17 VITALS — BP 110/80 | HR 70 | Temp 97.7°F | Ht 65.75 in | Wt 239.9 lb

## 2022-01-17 DIAGNOSIS — Z1211 Encounter for screening for malignant neoplasm of colon: Secondary | ICD-10-CM

## 2022-01-17 DIAGNOSIS — Z23 Encounter for immunization: Secondary | ICD-10-CM | POA: Diagnosis not present

## 2022-01-17 DIAGNOSIS — I2510 Atherosclerotic heart disease of native coronary artery without angina pectoris: Secondary | ICD-10-CM

## 2022-01-17 DIAGNOSIS — Z124 Encounter for screening for malignant neoplasm of cervix: Secondary | ICD-10-CM

## 2022-01-17 DIAGNOSIS — Z1231 Encounter for screening mammogram for malignant neoplasm of breast: Secondary | ICD-10-CM

## 2022-01-17 DIAGNOSIS — Z13820 Encounter for screening for osteoporosis: Secondary | ICD-10-CM

## 2022-01-17 DIAGNOSIS — F332 Major depressive disorder, recurrent severe without psychotic features: Secondary | ICD-10-CM

## 2022-01-17 DIAGNOSIS — E538 Deficiency of other specified B group vitamins: Secondary | ICD-10-CM | POA: Diagnosis not present

## 2022-01-17 DIAGNOSIS — I10 Essential (primary) hypertension: Secondary | ICD-10-CM

## 2022-01-17 DIAGNOSIS — E782 Mixed hyperlipidemia: Secondary | ICD-10-CM | POA: Diagnosis not present

## 2022-01-17 DIAGNOSIS — G4733 Obstructive sleep apnea (adult) (pediatric): Secondary | ICD-10-CM

## 2022-01-17 DIAGNOSIS — F3342 Major depressive disorder, recurrent, in full remission: Secondary | ICD-10-CM

## 2022-01-17 DIAGNOSIS — Z0000 Encounter for general adult medical examination without abnormal findings: Secondary | ICD-10-CM

## 2022-01-17 DIAGNOSIS — E039 Hypothyroidism, unspecified: Secondary | ICD-10-CM

## 2022-01-17 DIAGNOSIS — Z09 Encounter for follow-up examination after completed treatment for conditions other than malignant neoplasm: Secondary | ICD-10-CM

## 2022-01-17 LAB — CBC WITH DIFFERENTIAL/PLATELET
Basophils Absolute: 0 10*3/uL (ref 0.0–0.1)
Basophils Relative: 0.5 % (ref 0.0–3.0)
Eosinophils Absolute: 0.1 10*3/uL (ref 0.0–0.7)
Eosinophils Relative: 1.1 % (ref 0.0–5.0)
HCT: 38.6 % (ref 36.0–46.0)
Hemoglobin: 12.7 g/dL (ref 12.0–15.0)
Lymphocytes Relative: 24.4 % (ref 12.0–46.0)
Lymphs Abs: 1.9 10*3/uL (ref 0.7–4.0)
MCHC: 32.8 g/dL (ref 30.0–36.0)
MCV: 79.4 fl (ref 78.0–100.0)
Monocytes Absolute: 0.5 10*3/uL (ref 0.1–1.0)
Monocytes Relative: 6.3 % (ref 3.0–12.0)
Neutro Abs: 5.2 10*3/uL (ref 1.4–7.7)
Neutrophils Relative %: 67.7 % (ref 43.0–77.0)
Platelets: 334 10*3/uL (ref 150.0–400.0)
RBC: 4.86 Mil/uL (ref 3.87–5.11)
RDW: 15.9 % — ABNORMAL HIGH (ref 11.5–15.5)
WBC: 7.7 10*3/uL (ref 4.0–10.5)

## 2022-01-17 LAB — LIPID PANEL
Cholesterol: 153 mg/dL (ref 0–200)
HDL: 55.7 mg/dL (ref >39.00)
LDL Cholesterol: 70 mg/dL (ref 0–99)
NonHDL: 97.22
Total CHOL/HDL Ratio: 3
Triglycerides: 135 mg/dL (ref 0.0–149.0)
VLDL: 27 mg/dL (ref 0.0–40.0)

## 2022-01-17 LAB — COMPREHENSIVE METABOLIC PANEL
ALT: 13 U/L (ref 0–35)
AST: 15 U/L (ref 0–37)
Albumin: 4.2 g/dL (ref 3.5–5.2)
Alkaline Phosphatase: 90 U/L (ref 39–117)
BUN: 18 mg/dL (ref 6–23)
CO2: 31 mEq/L (ref 19–32)
Calcium: 9.9 mg/dL (ref 8.4–10.5)
Chloride: 99 mEq/L (ref 96–112)
Creatinine, Ser: 0.9 mg/dL (ref 0.40–1.20)
GFR: 66.36 mL/min (ref >60.00)
Glucose, Bld: 119 mg/dL — ABNORMAL HIGH (ref 70–99)
Potassium: 3.8 mEq/L (ref 3.5–5.1)
Sodium: 139 mEq/L (ref 135–145)
Total Bilirubin: 0.4 mg/dL (ref 0.2–1.2)
Total Protein: 7.5 g/dL (ref 6.0–8.3)

## 2022-01-17 LAB — VITAMIN B12: Vitamin B-12: 406 pg/mL (ref 211–911)

## 2022-01-17 NOTE — Progress Notes (Signed)
Established Patient Office Visit     CC/Reason for Visit: Subsequent Medicare wellness visit  HPI: Erika [name] is a 66 y.o. female who is coming in today for the above mentioned reasons. Past Medical History is significant for: Hypertension, hyperlipidemia, vitamin B12 deficiency, depression, obstructive sleep apnea, hypothyroidism, asthma, coronary artery disease,, OSA on CPAP.  In March 2023 she had CT concerning for significant stenosis, she had a heart cath and a drug-eluting stent to the LAD.  Her shortness of breath has improved since then.  She is overdue for eye and dental care.  She is overdue for flu, COVID, pneumonia vaccines.  She is overdue for bone density exam and all age-appropriate screening.   Past Medical/Surgical History: Past Medical History:  Diagnosis Date   Anxiety    Arthritis    Bell's palsy    COPD (chronic obstructive pulmonary disease) ([place])    History of cardiac murmur as a child    Hyperlipidemia    Hypertension    Menopausal syndrome    Mild asthma    no inhalers since stopped smoking   Myocardial infarction ([place])    2020--pt states no damage   Neuroma of foot    OSA on CPAP    Wears glasses     Past Surgical History:  Procedure Laterality Date   CORONARY STENT INTERVENTION N/A 07/14/2021   Procedure: CORONARY STENT INTERVENTION;  Surgeon: [name], MD;  Location: [place] [facility];  Service: Cardiovascular;  Laterality: N/A;   ELBOW SURGERY Right 2002   repair tendon and nerve injury   EXCISION MORTON'S NEUROMA Left 2010   INTRAVASCULAR IMAGING/OCT N/A 07/14/2021   Procedure: INTRAVASCULAR IMAGING/OCT;  Surgeon: [name], MD;  Location: [facility];  Service: Cardiovascular;  Laterality: N/A;   INTRAVASCULAR PRESSURE WIRE/FFR STUDY N/A 07/14/2021   Procedure: INTRAVASCULAR PRESSURE WIRE/FFR STUDY;  Surgeon: [name], MD;  Location: [facility];  Service: Cardiovascular;  Laterality: N/A;   LEFT  HEART CATH AND CORONARY ANGIOGRAPHY N/A 07/14/2021   Procedure: LEFT HEART CATH AND CORONARY ANGIOGRAPHY;  Surgeon: [name], MD;  Location: [facility] [facility];  Service: Cardiovascular;  Laterality: N/A;   ROBOTIC ASSISTED TOTAL HYSTERECTOMY WITH BILATERAL SALPINGO OOPHERECTOMY N/A 12/31/2017   Procedure: XI ROBOTIC ASSISTED TOTAL HYSTERECTOMY WITH BILATERAL SALPINGO OOPHORECTOMY;  Surgeon: Everitt Amber, MD;  Location: [facility];  Service: Gynecology;  Laterality: N/A;   SHOULDER ARTHROSCOPY W/ ROTATOR CUFF REPAIR Left 09/2016   and burectomy    Social History:  reports that she quit smoking about 21 years ago. Her smoking use included cigarettes. She has a 30.00 pack-year smoking history. She has never used smokeless tobacco. She reports that she does not drink alcohol and does not use drugs.  Allergies: Allergies  Allergen Reactions   Tape Other (See Comments)    PATIENT PREFERS CLOTH TAPE- THE OTHERS DON'T AGREE WITH HER SKIN   Tetracycline Hcl Rash    Family History:  Family History  Problem Relation Age of Onset   Depression Mother    Diabetes Mother    Heart disease Mother    Asthma Sister    Cancer Brother    Cancer Maternal Aunt    Cancer Maternal Uncle    Uterine cancer Maternal Uncle    Diabetes Maternal Grandfather    Diabetes Maternal Grandmother    Cancer Paternal Grandmother    Colon cancer Neg Hx    Colon polyps Neg  Hx    Esophageal cancer Neg Hx    Rectal cancer Neg Hx    Stomach cancer Neg Hx      Current Outpatient Medications:    acetaminophen (TYLENOL) 650 MG CR tablet, Take 650-1,300 mg by mouth every 8 (eight) hours as needed for pain., Disp: , Rfl:    albuterol (VENTOLIN HFA) 108 (90 Base) MCG/ACT inhaler, Inhale 2 puffs into the lungs every 4 (four) hours as needed for wheezing or shortness of breath., Disp: 18 g, Rfl: 0   aspirin EC 81 MG tablet, Take 1 tablet (81 mg total) by mouth daily. Swallow whole., Disp: , Rfl:    atorvastatin (LIPITOR)  40 MG tablet, TAKE 1 TABLET(40 MG) BY MOUTH DAILY, Disp: 90 tablet, Rfl: 0   buPROPion (WELLBUTRIN XL) 150 MG 24 hr tablet, TAKE 1 TABLET(150 MG) BY MOUTH DAILY, Disp: 90 tablet, Rfl: 0   cetirizine (ZYRTEC) 10 MG tablet, Take 10 mg by mouth daily as needed for allergies., Disp: , Rfl:    Cholecalciferol (VITAMIN D3 SUPER STRENGTH) 50 MCG (2000 UT) CAPS, Take 2,000 Units by mouth every evening., Disp: , Rfl:    citalopram (CELEXA) 20 MG tablet, Take 1 tablet (20 mg total) by mouth at bedtime., Disp: 90 tablet, Rfl: 0   clopidogrel (PLAVIX) 75 MG tablet, Take 1 tablet (75 mg total) by mouth daily., Disp: 90 tablet, Rfl: 1   cyanocobalamin (VITAMIN B12) 1000 MCG/ML injection, INJECT 1 ML (1,000 MCG TOTAL) INTO THE MUSCLE EVERY 30 DAYS., Disp: 1 mL, Rfl: 0   diphenhydramine-acetaminophen (TYLENOL PM) 25-500 MG TABS tablet, Take 1 tablet by mouth at bedtime as needed (sleep)., Disp: , Rfl:    furosemide (LASIX) 20 MG tablet, Take 1 tablet (20 mg total) by mouth daily., Disp: 90 tablet, Rfl: 1   hydrochlorothiazide (HYDRODIURIL) 25 MG tablet, Take 1 tablet (25 mg total) by mouth every evening., Disp: 90 tablet, Rfl: 0   hydrOXYzine (VISTARIL) 25 MG capsule, Take 1 capsule (25 mg total) by mouth at bedtime and may repeat dose one time if needed., Disp: 60 capsule, Rfl: 0   levothyroxine (SYNTHROID) 75 MCG tablet, TAKE 1 TABLET(75 MCG) BY MOUTH DAILY BEFORE BREAKFAST, Disp: 90 tablet, Rfl: 0   metoprolol succinate (TOPROL-XL) 25 MG 24 hr tablet, TAKE 1/2 TABLET(12.5 MG) BY MOUTH DAILY, Disp: 45 tablet, Rfl: 0   polyvinyl alcohol (LIQUIFILM TEARS) 1.4 % ophthalmic solution, Place 1 drop into both eyes 5 (five) times daily. (Patient taking differently: Place 1 drop into both eyes 5 (five) times daily as needed for dry eyes.), Disp: 15 mL, Rfl: 0   SYRINGE-NEEDLE, DISP, 3 ML (BD SAFETYGLIDE SYRINGE/NEEDLE) 25G X 1" 3 ML MISC, Use for B12 injections, Disp: 100 each, Rfl: 11   nitroGLYCERIN (NITROSTAT) 0.4 MG SL  tablet, Place 1 tablet (0.4 mg total) under the tongue every 5 (five) minutes as needed for chest pain. Up to 3 times., Disp: 90 tablet, Rfl: 3  Review of Systems:  Constitutional: Denies fever, chills, diaphoresis, appetite change and fatigue.  HEENT: Denies photophobia, eye pain, redness, hearing loss, ear pain, congestion, sore throat, rhinorrhea, sneezing, mouth sores, trouble swallowing, neck pain, neck stiffness and tinnitus.   Respiratory: Denies SOB, DOE, cough, chest tightness,  and wheezing.   Cardiovascular: Denies chest pain, palpitations and leg swelling.  Gastrointestinal: Denies nausea, vomiting, abdominal pain, diarrhea, constipation, blood in stool and abdominal distention.  Genitourinary: Denies dysuria, urgency, frequency, hematuria, flank pain and difficulty urinating.  Endocrine: Denies: hot  or cold intolerance, sweats, changes in hair or nails, polyuria, polydipsia. Musculoskeletal: Denies myalgias, back pain, joint swelling, arthralgias and gait problem.  Skin: Denies pallor, rash and wound.  Neurological: Denies dizziness, seizures, syncope, weakness, light-headedness, numbness and headaches.  Hematological: Denies adenopathy. Easy bruising, personal or family bleeding history  Psychiatric/Behavioral: Denies suicidal ideation, mood changes, confusion, nervousness, sleep disturbance and agitation    Physical Exam: Vitals:   01/17/22 0755  BP: 110/80  Pulse: 70  Temp: 97.7 F (36.5 C)  TempSrc: Oral  SpO2: 95%  Weight: 239 lb 14.4 oz (108.8 kg)  Height: 5' 5.75" (1.67 m)    Body mass index is 39.02 kg/m.   Constitutional: NAD, calm, comfortable, obese Eyes: PERRL, lids and conjunctivae normal, wears corrective lenses ENMT: Mucous membranes are moist. Posterior pharynx clear of any exudate or lesions. Normal dentition. Tympanic membrane is pearly white, no erythema or bulging. Neck: normal, supple, no masses, no thyromegaly Respiratory: clear to  auscultation bilaterally, no wheezing, no crackles. Normal respiratory effort. No accessory muscle use.  Cardiovascular: Regular rate and rhythm, no murmurs / rubs / gallops. No extremity edema. 2+ pedal pulses. No carotid bruits.  Abdomen: no tenderness, no masses palpated. No hepatosplenomegaly. Bowel sounds positive.  Musculoskeletal: no clubbing / cyanosis. No joint deformity upper and lower extremities. Good ROM, no contractures. Normal muscle tone.  Skin: no rashes, lesions, ulcers. No induration Neurologic: CN 2-12 grossly intact. Sensation intact, DTR normal. Strength 5/5 in all 4.  Psychiatric: Normal judgment and insight. Alert and oriented x 3. Normal mood.    Subsequent Medicare wellness visit   1. Risk factors, based on past  M,S,F -cardiovascular disease risk factors include age, known coronary artery disease, hypertension, hyperlipidemia   2.  Physical activities: Very sedentary   3.  Depression/mood: Significant depression, being followed by psychiatry   4.  Hearing: No perceived issues   5.  ADL's: In dependent in all ADLs   6.  Fall risk: Low fall risk   7.  Home safety: No problems identified   8.  Height weight, and visual acuity: height and weight as above, vision:  Vision Screening   Right eye Left eye Both eyes  Without correction     With correction 20 /20 20/20 20/20     9.  Counseling: Advised to increase activity and to update immunizations and cancer screenings   10. Lab orders based on risk factors: Laboratory update will be reviewed   11. Referral : Mammogram, GYN, GI, bone density   12. Care plan: Follow-up with me in 6 months   13. Cognitive assessment: No cognitive impairment   14. Screening: Patient provided with a written and personalized 5-10 year screening schedule in the AVS. yes   15. Provider List Update: PCP, psychiatry, cardiology  16. Advance Directives: Full code   17. Opioids: Patient is not on any opioid prescriptions and  has no risk factors for a substance use disorder.   [place] Visit from 11/29/2021 in [facility] at [facility]  PHQ-9 Total Score 10          04/11/2021    1:52 PM 07/14/2021    7:40 AM 08/21/2021    9:33 PM 08/22/2021    2:14 PM 11/29/2021    1:35 PM  [place] in the past year? 0  0 0 0  Was there an injury with Fall? 0   0 0  Fall Risk Category Calculator 0   0 0  Fall Risk Category Low   Low Low  Patient Fall Risk Level Low fall risk Moderate fall risk   Low fall risk  Patient at Risk for Falls Due to No Fall Risks   No Fall Risks No Fall Risks  Fall risk Follow up    Falls evaluation completed Falls evaluation completed     Impression and Plan:  Medicare annual wellness visit, subsequent  Need for influenza vaccination  Mixed hyperlipidemia - Plan: Lipid panel  Primary hypertension - Plan: CBC with Differential/Platelet, Comprehensive metabolic panel  Hypothyroidism, unspecified type  B12 deficiency - Plan: Vitamin B12  DEPRESSIVE DSORDER, RCR, FULL REMISSION  OSA on CPAP  Severe episode of recurrent major depressive disorder, without psychotic features ([place])  Coronary artery disease involving native coronary artery of native heart without angina pectoris    -Recommend routine eye and dental care. -Immunizations: Flu and PCV 20 administered in office today, she will get COVID vaccination at pharmacy. -Healthy lifestyle discussed in detail. -Labs to be updated today. -Colon cancer screening: 02/2013, was 5-year callback so is now overdue, send back to GI -Breast cancer screening: 10/2014, overdue, referral placed -Cervical cancer screening: Overdue, referral placed to GYN -Lung cancer screening: Not applicable -Prostate cancer screening: Not applicable -DEXA: Overdue, referral placed     Honesty Menta Isaac Bliss, MD [facility] at [facility]

## 2022-01-19 ENCOUNTER — Other Ambulatory Visit: Payer: Self-pay | Admitting: Internal Medicine

## 2022-01-19 DIAGNOSIS — E039 Hypothyroidism, unspecified: Secondary | ICD-10-CM

## 2022-01-19 DIAGNOSIS — F411 Generalized anxiety disorder: Secondary | ICD-10-CM

## 2022-01-19 DIAGNOSIS — I10 Essential (primary) hypertension: Secondary | ICD-10-CM

## 2022-01-23 ENCOUNTER — Encounter: Payer: Self-pay | Admitting: Internal Medicine

## 2022-01-23 NOTE — Telephone Encounter (Signed)
Pt is calling about her refills and stated she only have one Citalopram left .

## 2022-01-24 ENCOUNTER — Telehealth: Payer: Self-pay | Admitting: Internal Medicine

## 2022-01-24 NOTE — Telephone Encounter (Signed)
Pt received a flu shot and pneumo shot on 01/17/22 and has been feeling very tired, low energy, dizzy and headaches ever since.  Pt would like to know if this is to be expected after receiving both vaccinations?  Pt would like a call back.

## 2022-01-25 NOTE — Telephone Encounter (Signed)
Patient informed of the message and verbalized understanding 

## 2022-01-29 DIAGNOSIS — I671 Cerebral aneurysm, nonruptured: Secondary | ICD-10-CM | POA: Diagnosis not present

## 2022-02-02 ENCOUNTER — Other Ambulatory Visit: Payer: Self-pay | Admitting: Internal Medicine

## 2022-02-02 DIAGNOSIS — Z13820 Encounter for screening for osteoporosis: Secondary | ICD-10-CM

## 2022-02-02 DIAGNOSIS — Z09 Encounter for follow-up examination after completed treatment for conditions other than malignant neoplasm: Secondary | ICD-10-CM

## 2022-02-07 DIAGNOSIS — I671 Cerebral aneurysm, nonruptured: Secondary | ICD-10-CM | POA: Diagnosis not present

## 2022-02-13 ENCOUNTER — Telehealth: Payer: Self-pay

## 2022-02-13 NOTE — Telephone Encounter (Signed)
Erika [name], Please review TE from 01/2021 where patient was requested to be cleared by neuro and cardiology prior to being scheduled for her recall colon-  Second- patient has been placed on Plavix so there will need to be a request for that medication to be held as well 3rd-[name] has documented in the chart that the patient will need to have documentation in the chart of her cardiac clearance prior to her having clearance for her procedure to be done at [facility]- there is no documentation in this patient chart that I can find  Please assist in finding this information   I do think having the schedulers call this patient and have her scheduled for an OV would be the necessary step as she has not been seen in the office since being placed on Plavix=====  Thank you Bre

## 2022-02-14 NOTE — Telephone Encounter (Signed)
Wouldn't you agree patient needs office visit since she has been put on Plavix since we requested she get cardiac clearance a year ago?  If so I will get that taken care of.

## 2022-02-14 NOTE — Telephone Encounter (Signed)
Yes she needs OV

## 2022-02-14 NOTE — Telephone Encounter (Signed)
Would you please schedule this patient for an office visit with Dr. Henrene Pastor since she has been put on Plavix since her colonoscopy was discussed last October?

## 2022-02-16 ENCOUNTER — Ambulatory Visit: Payer: Medicare Other | Admitting: Student

## 2022-02-16 NOTE — Telephone Encounter (Signed)
Call to pt to sched ov prior to colonoscopy, pt states she is fine to see Dr Henrene Pastor or any AP, not in town 11/16-27, but ok with any other date/time and requests I call her back with appt.

## 2022-03-02 ENCOUNTER — Other Ambulatory Visit: Payer: Self-pay | Admitting: Internal Medicine

## 2022-03-12 NOTE — Progress Notes (Deleted)
03/12/2022 Erika [name] [id number] 1955/06/29  Referring provider: Isaac Bliss, [place]* Primary GI doctor: Dr. Henrene Pastor  ASSESSMENT AND PLAN:   There are no diagnoses linked to this encounter.   Patient Care Team: Isaac Bliss, Rayford Halsted, MD as PCP - General (Internal Medicine) Berniece Salines, DO as PCP - Cardiology (Cardiology) Clance, Armando Reichert, MD as Consulting Physician (Pulmonary Disease)  HISTORY OF PRESENT ILLNESS: 66 y.o. female with a past medical history of hyperlipidemia, depression/anxiety, hypothyroidism, COPD with OSA overlap, history of MI 2020 on Plavix and others listed below presents for evaluation of colonoscopy while on Plavix.  02/23/2013 colonoscopy Excellent prep with movi prep 2 diminutive polyps in colon tubular adenomatous, going to new guidelines in 7 to 10 years.  She  reports that she quit smoking about 21 years ago. Her smoking use included cigarettes. She has a 30.00 pack-year smoking history. She has never used smokeless tobacco. She reports that she does not drink alcohol and does not use drugs.  Current Medications:   Current Outpatient Medications (Endocrine & Metabolic):    levothyroxine (SYNTHROID) 75 MCG tablet, Take 1 tablet (75 mcg total) by mouth daily before breakfast.  Current Outpatient Medications (Cardiovascular):    atorvastatin (LIPITOR) 40 MG tablet, Take 1 tablet (40 mg total) by mouth daily.   furosemide (LASIX) 20 MG tablet, TAKE 1 TABLET(20 MG) BY MOUTH DAILY   hydrochlorothiazide (HYDRODIURIL) 25 MG tablet, TAKE 1 TABLET(25 MG) BY MOUTH EVERY EVENING   metoprolol succinate (TOPROL-XL) 25 MG 24 hr tablet, TAKE 1/2 TABLET(12.5 MG) BY MOUTH DAILY   nitroGLYCERIN (NITROSTAT) 0.4 MG SL tablet, Place 1 tablet (0.4 mg total) under the tongue every 5 (five) minutes as needed for chest pain. Up to 3 times.  Current Outpatient Medications (Respiratory):    albuterol (VENTOLIN HFA) 108 (90 Base) MCG/ACT inhaler, Inhale 2 puffs  into the lungs every 4 (four) hours as needed for wheezing or shortness of breath.   cetirizine (ZYRTEC) 10 MG tablet, Take 10 mg by mouth daily as needed for allergies.  Current Outpatient Medications (Analgesics):    acetaminophen (TYLENOL) 650 MG CR tablet, Take 650-1,300 mg by mouth every 8 (eight) hours as needed for pain.   aspirin EC 81 MG tablet, Take 1 tablet (81 mg total) by mouth daily. Swallow whole.  Current Outpatient Medications (Hematological):    clopidogrel (PLAVIX) 75 MG tablet, TAKE 1 TABLET(75 MG) BY MOUTH DAILY   cyanocobalamin (VITAMIN B12) 1000 MCG/ML injection, INJECT 1 ML (1,000 MCG TOTAL) INTO THE MUSCLE EVERY 30 DAYS.  Current Outpatient Medications (Other):    buPROPion (WELLBUTRIN XL) 150 MG 24 hr tablet, TAKE 1 TABLET(150 MG) BY MOUTH DAILY   Cholecalciferol (VITAMIN D3 SUPER STRENGTH) 50 MCG (2000 UT) CAPS, Take 2,000 Units by mouth every evening.   citalopram (CELEXA) 20 MG tablet, Take 1 tablet (20 mg total) by mouth at bedtime.   diphenhydramine-acetaminophen (TYLENOL PM) 25-500 MG TABS tablet, Take 1 tablet by mouth at bedtime as needed (sleep).   hydrOXYzine (VISTARIL) 25 MG capsule, Take 1 capsule (25 mg total) by mouth at bedtime and may repeat dose one time if needed.   polyvinyl alcohol (LIQUIFILM TEARS) 1.4 % ophthalmic solution, Place 1 drop into both eyes 5 (five) times daily. (Patient taking differently: Place 1 drop into both eyes 5 (five) times daily as needed for dry eyes.)   SYRINGE-NEEDLE, DISP, 3 ML (BD SAFETYGLIDE SYRINGE/NEEDLE) 25G X 1" 3 ML MISC, Use for B12 injections  Medical  History:  Past Medical History:  Diagnosis Date   Anxiety    Arthritis    Bell's palsy    COPD (chronic obstructive pulmonary disease) ([facility])    History of cardiac murmur as a child    Hyperlipidemia    Hypertension    Menopausal syndrome    Mild asthma    no inhalers since stopped smoking   Myocardial infarction ([place])    2020--pt states no damage    Neuroma of foot    OSA on CPAP    Wears glasses    Allergies:  Allergies  Allergen Reactions   Tape Other (See Comments)    PATIENT PREFERS CLOTH TAPE- THE OTHERS DON'T AGREE WITH HER SKIN   Tetracycline Hcl Rash     Surgical History:  She  has a past surgical history that includes Elbow surgery (Right, 2002); Excision Morton's neuroma (Left, 2010); Shoulder arthroscopy w/ rotator cuff repair (Left, 09/2016); Robotic assisted total hysterectomy with bilateral salpingo oophorectomy (N/A, 12/31/2017); LEFT HEART CATH AND CORONARY ANGIOGRAPHY (N/A, 07/14/2021); INTRAVASCULAR PRESSURE WIRE/FFR STUDY (N/A, 07/14/2021); INTRAVASCULAR IMAGING/OCT (N/A, 07/14/2021); and CORONARY STENT INTERVENTION (N/A, 07/14/2021). Family History:  Her family history includes Asthma in her sister; Cancer in her brother, maternal aunt, maternal uncle, and paternal grandmother; Depression in her mother; Diabetes in her maternal grandfather, maternal grandmother, and mother; Heart disease in her mother; Uterine cancer in her maternal uncle.  REVIEW OF SYSTEMS  : All other systems reviewed and negative except where noted in the History of Present Illness.  PHYSICAL EXAM: There were no vitals taken for this visit. General:   Pleasant, well developed female in no acute distress Head:   Normocephalic and atraumatic. Eyes:  sclerae anicteric,conjunctive pink  Heart:   {HEART EXAM HEM/ONC:21750} Pulm:  Clear anteriorly; no wheezing Abdomen:   {BlankSingle:19197::"Distended","Ridged","Soft"}, {BlankSingle:19197::"Flat","Obese","Non-distended"} AB, {BlankSingle:19197::"Absent","Hyperactive, tinkling","Hypoactive","Sluggish","Active"} bowel sounds. {actendernessAB:27319} tenderness {anatomy; site abdomen:5010}. {BlankMultiple:19196::"Without guarding","With guarding","Without rebound","With rebound"}, No organomegaly appreciated. Rectal: {acrectalexam:27461} Extremities:  {With/Without:304960234} edema. Msk: Symmetrical without  gross deformities. Peripheral pulses intact.  Neurologic:  Alert and  oriented x4;  No focal deficits.  Skin:   Dry and intact without significant lesions or rashes. Psychiatric:  Cooperative. Normal mood and affect.    Vladimir Crofts, PA-C 10:33 AM

## 2022-03-13 ENCOUNTER — Ambulatory Visit: Payer: Medicare Other | Admitting: Physician Assistant

## 2022-03-13 ENCOUNTER — Other Ambulatory Visit: Payer: Medicare Other

## 2022-03-15 ENCOUNTER — Ambulatory Visit: Payer: Medicare Other

## 2022-03-19 ENCOUNTER — Encounter: Payer: Medicare Other | Admitting: Internal Medicine

## 2022-03-27 ENCOUNTER — Ambulatory Visit: Payer: Medicare Other

## 2022-03-28 ENCOUNTER — Ambulatory Visit: Payer: Medicare Other | Admitting: Obstetrics and Gynecology

## 2022-04-01 ENCOUNTER — Emergency Department (HOSPITAL_BASED_OUTPATIENT_CLINIC_OR_DEPARTMENT_OTHER): Payer: Medicare Other | Admitting: Radiology

## 2022-04-01 ENCOUNTER — Encounter (HOSPITAL_BASED_OUTPATIENT_CLINIC_OR_DEPARTMENT_OTHER): Payer: Self-pay | Admitting: Emergency Medicine

## 2022-04-01 ENCOUNTER — Emergency Department (HOSPITAL_BASED_OUTPATIENT_CLINIC_OR_DEPARTMENT_OTHER)
Admission: EM | Admit: 2022-04-01 | Discharge: 2022-04-01 | Discharge status: Against Medical Advice | Payer: Medicare Other | Attending: Student | Admitting: Student

## 2022-04-01 ENCOUNTER — Other Ambulatory Visit: Payer: Self-pay

## 2022-04-01 DIAGNOSIS — M7989 Other specified soft tissue disorders: Secondary | ICD-10-CM | POA: Diagnosis not present

## 2022-04-01 DIAGNOSIS — Z7902 Long term (current) use of antithrombotics/antiplatelets: Secondary | ICD-10-CM | POA: Insufficient documentation

## 2022-04-01 DIAGNOSIS — Z5321 Procedure and treatment not carried out due to patient leaving prior to being seen by health care provider: Secondary | ICD-10-CM | POA: Diagnosis not present

## 2022-04-01 NOTE — ED Triage Notes (Signed)
Pt was using ladder yesterday,up and down several times. When she was walking after that she felt a funny feeling shoot through her foot (on the side of the great toe). In triage she has swelling/tightness to right great toe. Positive pulses.

## 2022-04-01 NOTE — ED Triage Notes (Signed)
She is on plavix

## 2022-04-02 ENCOUNTER — Ambulatory Visit: Payer: Medicare Other | Admitting: Family Medicine

## 2022-04-02 DIAGNOSIS — M205X2 Other deformities of toe(s) (acquired), left foot: Secondary | ICD-10-CM | POA: Diagnosis not present

## 2022-04-02 DIAGNOSIS — S93601A Unspecified sprain of right foot, initial encounter: Secondary | ICD-10-CM | POA: Diagnosis not present

## 2022-04-02 DIAGNOSIS — M205X1 Other deformities of toe(s) (acquired), right foot: Secondary | ICD-10-CM | POA: Diagnosis not present

## 2022-04-02 DIAGNOSIS — M109 Gout, unspecified: Secondary | ICD-10-CM | POA: Diagnosis not present

## 2022-04-02 DIAGNOSIS — R262 Difficulty in walking, not elsewhere classified: Secondary | ICD-10-CM | POA: Diagnosis not present

## 2022-04-03 ENCOUNTER — Telehealth: Payer: Self-pay | Admitting: Internal Medicine

## 2022-04-03 DIAGNOSIS — M109 Gout, unspecified: Secondary | ICD-10-CM | POA: Diagnosis not present

## 2022-04-03 NOTE — Telephone Encounter (Signed)
error 

## 2022-04-05 ENCOUNTER — Telehealth: Payer: Self-pay

## 2022-04-05 NOTE — Telephone Encounter (Signed)
     Patient  visit on 12/17  at [facility]  Have you been able to follow up with your primary care physician? Yes   The patient was or was not able to obtain any needed medicine or equipment. Yes   Are there diet recommendations that you are having difficulty following? Na   Patient expresses understanding of discharge instructions and education provided has no other needs at this time.  Yes      [place], [facility], Care Management  [phone] [address], [place], [place] [address] Phone: [phone] Email: [email or web address]

## 2022-04-10 ENCOUNTER — Other Ambulatory Visit: Payer: Self-pay | Admitting: Internal Medicine

## 2022-04-13 DIAGNOSIS — R262 Difficulty in walking, not elsewhere classified: Secondary | ICD-10-CM | POA: Diagnosis not present

## 2022-04-13 DIAGNOSIS — S93601D Unspecified sprain of right foot, subsequent encounter: Secondary | ICD-10-CM | POA: Diagnosis not present

## 2022-04-13 DIAGNOSIS — D2371 Other benign neoplasm of skin of right lower limb, including hip: Secondary | ICD-10-CM | POA: Diagnosis not present

## 2022-05-02 ENCOUNTER — Telehealth: Payer: Self-pay | Admitting: Internal Medicine

## 2022-05-02 DIAGNOSIS — G4733 Obstructive sleep apnea (adult) (pediatric): Secondary | ICD-10-CM

## 2022-05-02 NOTE — Telephone Encounter (Signed)
Patient called because she needs another CPAP machine as hers is not acting right. She said that she wants to go back to [facility], but she does not want to see Dr.Olalere. She needs a new prescription from Pulmonary before she can get a new machine.      Please advise

## 2022-05-02 NOTE — Telephone Encounter (Signed)
Referral placed

## 2022-05-02 NOTE — Telephone Encounter (Signed)
Okay to refer? 

## 2022-05-08 ENCOUNTER — Other Ambulatory Visit: Payer: Self-pay | Admitting: Internal Medicine

## 2022-05-08 DIAGNOSIS — F3342 Major depressive disorder, recurrent, in full remission: Secondary | ICD-10-CM

## 2022-05-11 ENCOUNTER — Other Ambulatory Visit: Payer: Self-pay | Admitting: Internal Medicine

## 2022-05-11 DIAGNOSIS — F3342 Major depressive disorder, recurrent, in full remission: Secondary | ICD-10-CM

## 2022-05-15 ENCOUNTER — Other Ambulatory Visit: Payer: Self-pay | Admitting: Internal Medicine

## 2022-05-15 DIAGNOSIS — F3342 Major depressive disorder, recurrent, in full remission: Secondary | ICD-10-CM

## 2022-05-15 NOTE — Telephone Encounter (Signed)
Pt called to request a refill of the following:   citalopram (CELEXA) 40 MG tablet   Pt asked me to inform MD that she is only taking one 40 mg tablet a day.  LOV:  01/17/22 = CPE    Please advise.  [id number] [place] [address]: [phone] Fax: [phone]

## 2022-05-16 ENCOUNTER — Other Ambulatory Visit: Payer: Self-pay | Admitting: Internal Medicine

## 2022-05-16 MED ORDER — CITALOPRAM HYDROBROMIDE 40 MG PO TABS: 40.0000 mg | ORAL_TABLET | Freq: Every day | ORAL | 1 refills | Status: DC

## 2022-05-16 NOTE — Telephone Encounter (Signed)
Refill sent.

## 2022-05-18 ENCOUNTER — Ambulatory Visit: Payer: Medicare Other

## 2022-05-31 ENCOUNTER — Encounter (HOSPITAL_BASED_OUTPATIENT_CLINIC_OR_DEPARTMENT_OTHER): Payer: Self-pay | Admitting: Pulmonary Disease

## 2022-05-31 ENCOUNTER — Ambulatory Visit (INDEPENDENT_AMBULATORY_CARE_PROVIDER_SITE_OTHER): Payer: Medicare Other | Admitting: Pulmonary Disease

## 2022-05-31 VITALS — BP 120/88 | HR 75 | Temp 97.5°F | Ht 65.75 in | Wt 246.0 lb

## 2022-05-31 DIAGNOSIS — G4733 Obstructive sleep apnea (adult) (pediatric): Secondary | ICD-10-CM | POA: Diagnosis not present

## 2022-05-31 DIAGNOSIS — Z9189 Other specified personal risk factors, not elsewhere classified: Secondary | ICD-10-CM | POA: Diagnosis not present

## 2022-05-31 DIAGNOSIS — R0609 Other forms of dyspnea: Secondary | ICD-10-CM

## 2022-05-31 DIAGNOSIS — R911 Solitary pulmonary nodule: Secondary | ICD-10-CM | POA: Diagnosis not present

## 2022-05-31 NOTE — Assessment & Plan Note (Signed)
4 mm incidental nodule noted on CT coronaries in 2023 will need 1 year follow-up due to her history of smoking

## 2022-05-31 NOTE — Progress Notes (Signed)
Subjective:    Patient ID: Erika [name], female    DOB: 03-08-1956, 67 y.o.   MRN: [id number]  HPI  67 year old presents for evaluation of dyspnea on exertion and OSA.  She has a history of OSA dating back to 2004, where she had an AHI of 22 events per hour.  She has been maintained on CPAP since then with good improvement in her daytime somnolence and fatigue.  Lately if she feels like her breathing is worse at night and she cannot get enough air.  This creates anxiety.  She wishes she could do away with her CPAP machine.  She has a ResMed air sense AutoSet 10 and uses an F20 nasal mask, DME is [employer] She reports dyspnea on exertion for 1 year and gradually worsening, this is worse on walking, climbing stairs or cleaning her house.  She reports an associated dry cough.  She underwent cardiac evaluation including normal echo cardiac cath showed LVEDP of 22 but nonsignificant CAD. CT chest was unremarkable except for 4 mm right upper lobe nodule  Epworth sleepiness score is 11 and she reports some sleepiness while watching TV, as a passenger in the car or resting in the afternoon Bedtime is around 9 PM, sleep latency is about 30 minutes, she sleeps on her side with 1 pillow and has her head of bed elevated about 15 degrees, reports 2-3 nocturnal awakenings and is out of bed at 6:15 AM feeling tired occasionally with dryness of mouth but denies headaches.  There is no history suggestive of cataplexy, sleep paralysis or parasomnias  On ambulation today heart rate increased from 68-111 and oxygen saturation stayed 96 to 97%, she complained of dyspnea, no wheezing was auscultated  PMH- CAD Hypertension She smoked a pack per day, about 25 pack years before she quit in 2004  Significant tests/ events reviewed 07/2002  NPSG RDI 22/h, low sat 86% , PLM 2/h  02/2020 CT angiogram chest negative for PE 06/2021 CT cors -right upper lobe 4 mm nodule  Echo 02/2021 normal LV function, normal RV  size  06/2021 cardiac cath-no significant CAD, LVEDP 22  Past Medical History:  Diagnosis Date   Anxiety    Arthritis    Bell's palsy    COPD (chronic obstructive pulmonary disease) (HCC)    History of cardiac murmur as a child    Hyperlipidemia    Hypertension    Menopausal syndrome    Mild asthma    no inhalers since stopped smoking   Myocardial infarction ([place])    2020--pt states no damage   Neuroma of foot    OSA on CPAP    Wears glasses     Past Surgical History:  Procedure Laterality Date   CORONARY STENT INTERVENTION N/A 07/14/2021   Procedure: CORONARY STENT INTERVENTION;  Surgeon: [name], MD;  Location: [facility];  Service: Cardiovascular;  Laterality: N/A;   ELBOW SURGERY Right 2002   repair tendon and nerve injury   EXCISION MORTON'S NEUROMA Left 2010   INTRAVASCULAR IMAGING/OCT N/A 07/14/2021   Procedure: INTRAVASCULAR IMAGING/OCT;  Surgeon: [name], MD;  Location: [facility];  Service: Cardiovascular;  Laterality: N/A;   INTRAVASCULAR PRESSURE WIRE/FFR STUDY N/A 07/14/2021   Procedure: INTRAVASCULAR PRESSURE WIRE/FFR STUDY;  Surgeon: [name], MD;  Location: [facility];  Service: Cardiovascular;  Laterality: N/A;   LEFT HEART CATH AND CORONARY ANGIOGRAPHY N/A 07/14/2021   Procedure: LEFT HEART CATH AND CORONARY ANGIOGRAPHY;  Surgeon: [name], MD;  Location: [facility];  Service: Cardiovascular;  Laterality: N/A;   ROBOTIC ASSISTED TOTAL HYSTERECTOMY WITH BILATERAL SALPINGO OOPHERECTOMY N/A 12/31/2017   Procedure: XI ROBOTIC ASSISTED TOTAL HYSTERECTOMY WITH BILATERAL SALPINGO OOPHORECTOMY;  Surgeon: Everitt Amber, MD;  Location: [facility];  Service: Gynecology;  Laterality: N/A;   SHOULDER ARTHROSCOPY W/ ROTATOR CUFF REPAIR Left 09/2016   and burectomy    Allergies  Allergen Reactions   Tape Other (See Comments)    PATIENT PREFERS CLOTH TAPE- THE OTHERS DON'T AGREE WITH HER SKIN   Tetracycline Hcl Rash     Social History   Socioeconomic History   Marital status: Married    Spouse name: Not on file   Number of children: 2   Years of education: Not on file   Highest education level: 12th grade  Occupational History   Occupation: Solicitor: [employer]  Tobacco Use   Smoking status: Former    Packs/day: 1.00    Years: 30.00    Total pack years: 30.00    Types: Cigarettes    Quit date: 06/01/2000    Years since quitting: 22.0   Smokeless tobacco: Never  Vaping Use   Vaping Use: Never used  Substance and Sexual Activity   Alcohol use: No   Drug use: Never   Sexual activity: Not Currently    Birth control/protection: Post-menopausal  Other Topics Concern   Not on file  Social History Narrative   Not on file   Social Determinants of Health   Financial Resource Strain: Low Risk  (08/21/2021)   Overall Financial Resource Strain (CARDIA)    Difficulty of Paying Living Expenses: Not very hard  Food Insecurity: No Food Insecurity (08/21/2021)   Hunger Vital Sign    Worried About Running Out of Food in the Last Year: Never true    Ran Out of Food in the Last Year: Never true  Transportation Needs: No Transportation Needs (08/21/2021)   PRAPARE - Hydrologist (Medical): No    Lack of Transportation (Non-Medical): No  Physical Activity: Unknown (08/21/2021)   Exercise Vital Sign    Days of Exercise per Week: 0 days    Minutes of Exercise per Session: Not on file  Stress: Stress Concern Present (08/21/2021)   [place]    Feeling of Stress : Very much  Social Connections: Unknown (08/21/2021)   Social Connection and Isolation Panel [NHANES]    Frequency of Communication with Friends and Family: Once a week    Frequency of Social Gatherings with Friends and Family: Patient refused    Attends Religious Services: 1 to 4 times per year    Active Member of [employer] or  Organizations: Yes    Attends Archivist Meetings: 1 to 4 times per year    Marital Status: Not on file  Intimate Partner Violence: Not on file    Family History  Problem Relation Age of Onset   Depression Mother    Diabetes Mother    Heart disease Mother    Asthma Sister    Cancer Brother    Cancer Maternal Aunt    Cancer Maternal Uncle    Uterine cancer Maternal Uncle    Diabetes Maternal Grandfather    Diabetes Maternal Grandmother    Cancer Paternal Grandmother    Colon cancer Neg Hx    Colon polyps Neg Hx  Esophageal cancer Neg Hx    Rectal cancer Neg Hx    Stomach cancer Neg Hx      Review of Systems Constitutional: negative for anorexia, fevers and sweats  Eyes: negative for irritation, redness and visual disturbance  Ears, nose, mouth, throat, and face: negative for earaches, epistaxis, nasal congestion and sore throat  Respiratory: negative for  sputum and wheezing  Cardiovascular: negative for chest pain, lower extremity edema, orthopnea, palpitations and syncope  Gastrointestinal: negative for abdominal pain, constipation, diarrhea, melena, nausea and vomiting  Genitourinary:negative for dysuria, frequency and hematuria  Hematologic/lymphatic: negative for bleeding, easy bruising and lymphadenopathy  Musculoskeletal:negative for arthralgias, muscle weakness and stiff joints  Neurological: negative for coordination problems, gait problems, headaches and weakness  Endocrine: negative for diabetic symptoms including polydipsia, polyuria and weight loss     Objective:   Physical Exam  Gen. Pleasant, obese, in no distress, normal affect ENT - no pallor,icterus, no post nasal drip, class 2-3 airway Neck: No JVD, no thyromegaly, no carotid bruits Lungs: no use of accessory muscles, no dullness to percussion, decreased without rales or rhonchi  Cardiovascular: Rhythm regular, heart sounds  normal, no murmurs or gallops, no peripheral edema Abdomen: soft  and non-tender, no hepatosplenomegaly, BS normal. Musculoskeletal: No deformities, no cyanosis or clubbing Neuro:  alert, non focal, no tremors        Assessment & Plan:

## 2022-05-31 NOTE — Patient Instructions (Addendum)
X Amb sat  X schedule PFTs  X CT chest wo con in March for follow up of small nodule noted in march 2023  X Replacement autoCPAP 8-15 cm with nasal cradle mask

## 2022-05-31 NOTE — Assessment & Plan Note (Signed)
CPAP download was reviewed which shows excellent control of events on auto settings 5 to 20 cm with average pressure of 13 and maximum pressure of 14.5 cm.  She has a moderate leak and is very compliant more than 7.5 hours per night without a single missed night. CPAP is only helped improve her daytime somnolence and fatigue. Will get her a replacement auto CPAP but tweak settings to 8 to 15 cm.  This will enable her to "get more air"  Weight loss encouraged, compliance with goal of at least 4-6 hrs every night is the expectation. Advised against medications with sedative side effects Cautioned against driving when sleepy - understanding that sleepiness will vary on a day to day basis

## 2022-05-31 NOTE — Assessment & Plan Note (Addendum)
Etiology for dyspnea is not apparent.  Cardiac workup was negative except for elevated LVEDP on cardiac cath.  She was given Lasix without significant improvement. She does not appear to be fluid overloaded.  There is no evidence of ILD or pulmonary hypertension.  She quit smoking 20 years ago. Will obtain PFTs to evaluate for airway obstruction but dyspnea may very well be related to deconditioning and weight gain. She feels that her CPAP machine may have something to do with this and we will replace

## 2022-06-10 ENCOUNTER — Ambulatory Visit (HOSPITAL_BASED_OUTPATIENT_CLINIC_OR_DEPARTMENT_OTHER)
Admission: RE | Admit: 2022-06-10 | Discharge: 2022-06-10 | Disposition: A | Discharge status: Home / Self Care | Payer: Medicare Other | Source: Ambulatory Visit | Attending: Pulmonary Disease | Admitting: Pulmonary Disease

## 2022-06-10 DIAGNOSIS — I700 Atherosclerosis of aorta: Secondary | ICD-10-CM | POA: Diagnosis not present

## 2022-06-10 DIAGNOSIS — R911 Solitary pulmonary nodule: Secondary | ICD-10-CM | POA: Diagnosis not present

## 2022-06-10 DIAGNOSIS — Z9189 Other specified personal risk factors, not elsewhere classified: Secondary | ICD-10-CM | POA: Diagnosis not present

## 2022-07-17 ENCOUNTER — Encounter (HOSPITAL_COMMUNITY): Payer: Self-pay

## 2022-07-17 ENCOUNTER — Emergency Department (HOSPITAL_COMMUNITY): Payer: Medicare Other

## 2022-07-17 ENCOUNTER — Encounter (HOSPITAL_COMMUNITY): Payer: Self-pay | Admitting: Emergency Medicine

## 2022-07-17 ENCOUNTER — Ambulatory Visit (HOSPITAL_COMMUNITY)
Admission: EM | Admit: 2022-07-17 | Discharge: 2022-07-17 | Disposition: A | Discharge status: Home / Self Care | Payer: Medicare Other

## 2022-07-17 ENCOUNTER — Other Ambulatory Visit: Payer: Self-pay

## 2022-07-17 ENCOUNTER — Emergency Department (HOSPITAL_COMMUNITY)
Admission: EM | Admit: 2022-07-17 | Discharge: 2022-07-18 | Disposition: A | Discharge status: Home / Self Care | Payer: Medicare Other | Attending: Emergency Medicine | Admitting: Emergency Medicine

## 2022-07-17 DIAGNOSIS — R519 Headache, unspecified: Secondary | ICD-10-CM | POA: Insufficient documentation

## 2022-07-17 DIAGNOSIS — J45909 Unspecified asthma, uncomplicated: Secondary | ICD-10-CM | POA: Diagnosis not present

## 2022-07-17 DIAGNOSIS — Z79899 Other long term (current) drug therapy: Secondary | ICD-10-CM | POA: Insufficient documentation

## 2022-07-17 DIAGNOSIS — R112 Nausea with vomiting, unspecified: Secondary | ICD-10-CM | POA: Diagnosis not present

## 2022-07-17 DIAGNOSIS — Z7902 Long term (current) use of antithrombotics/antiplatelets: Secondary | ICD-10-CM | POA: Insufficient documentation

## 2022-07-17 DIAGNOSIS — R1084 Generalized abdominal pain: Secondary | ICD-10-CM | POA: Insufficient documentation

## 2022-07-17 DIAGNOSIS — R109 Unspecified abdominal pain: Secondary | ICD-10-CM | POA: Diagnosis present

## 2022-07-17 DIAGNOSIS — I10 Essential (primary) hypertension: Secondary | ICD-10-CM | POA: Insufficient documentation

## 2022-07-17 DIAGNOSIS — R42 Dizziness and giddiness: Secondary | ICD-10-CM | POA: Diagnosis not present

## 2022-07-17 DIAGNOSIS — Z7982 Long term (current) use of aspirin: Secondary | ICD-10-CM | POA: Diagnosis not present

## 2022-07-17 DIAGNOSIS — J449 Chronic obstructive pulmonary disease, unspecified: Secondary | ICD-10-CM | POA: Insufficient documentation

## 2022-07-17 DIAGNOSIS — Z1152 Encounter for screening for COVID-19: Secondary | ICD-10-CM | POA: Diagnosis not present

## 2022-07-17 LAB — CBC
HCT: 42.2 % (ref 36.0–46.0)
Hemoglobin: 13.7 g/dL (ref 12.0–15.0)
MCH: 26 pg (ref 26.0–34.0)
MCHC: 32.5 g/dL (ref 30.0–36.0)
MCV: 80.1 fL (ref 80.0–100.0)
Platelets: 379 10*3/uL (ref 150–400)
RBC: 5.27 MIL/uL — ABNORMAL HIGH (ref 3.87–5.11)
RDW: 15 % (ref 11.5–15.5)
WBC: 9.5 10*3/uL (ref 4.0–10.5)
nRBC: 0 % (ref 0.0–0.2)

## 2022-07-17 LAB — COMPREHENSIVE METABOLIC PANEL
ALT: 22 U/L (ref 0–44)
AST: 26 U/L (ref 15–41)
Albumin: 3.8 g/dL (ref 3.5–5.0)
Alkaline Phosphatase: 85 U/L (ref 38–126)
Anion gap: 15 (ref 5–15)
BUN: 11 mg/dL (ref 8–23)
CO2: 20 mmol/L — ABNORMAL LOW (ref 22–32)
Calcium: 9.7 mg/dL (ref 8.9–10.3)
Chloride: 102 mmol/L (ref 98–111)
Creatinine, Ser: 0.91 mg/dL (ref 0.44–1.00)
GFR, Estimated: >60 mL/min (ref >60)
Glucose, Bld: 159 mg/dL — ABNORMAL HIGH (ref 70–99)
Potassium: 3.9 mmol/L (ref 3.5–5.1)
Sodium: 137 mmol/L (ref 135–145)
Total Bilirubin: 0.7 mg/dL (ref 0.3–1.2)
Total Protein: 7.6 g/dL (ref 6.5–8.1)

## 2022-07-17 LAB — LIPASE, BLOOD: Lipase: 25 U/L (ref 11–51)

## 2022-07-17 LAB — SARS CORONAVIRUS 2 BY RT PCR: SARS Coronavirus 2 by RT PCR: NEGATIVE

## 2022-07-17 MED ORDER — ONDANSETRON 4 MG PO TBDP
4.0000 mg | ORAL_TABLET | Freq: Once | ORAL | Status: AC
  Administered 2022-07-17: 4 mg via ORAL
  Filled 2022-07-17: qty 1

## 2022-07-17 MED ORDER — SODIUM CHLORIDE 0.9 % IV BOLUS
500.0000 mL | Freq: Once | INTRAVENOUS | Status: AC
  Administered 2022-07-17: 500 mL via INTRAVENOUS

## 2022-07-17 MED ORDER — HALOPERIDOL LACTATE 5 MG/ML IJ SOLN
2.0000 mg | Freq: Once | INTRAMUSCULAR | Status: AC
  Administered 2022-07-17: 2 mg via INTRAVENOUS
  Filled 2022-07-17: qty 1

## 2022-07-17 MED ORDER — FENTANYL CITRATE PF 50 MCG/ML IJ SOSY
50.0000 ug | PREFILLED_SYRINGE | Freq: Once | INTRAMUSCULAR | Status: AC
  Administered 2022-07-17: 50 ug via INTRAVENOUS
  Filled 2022-07-17: qty 1

## 2022-07-17 MED ORDER — PROCHLORPERAZINE EDISYLATE 10 MG/2ML IJ SOLN
10.0000 mg | Freq: Once | INTRAMUSCULAR | Status: AC
  Administered 2022-07-17: 10 mg via INTRAVENOUS
  Filled 2022-07-17: qty 2

## 2022-07-17 MED ORDER — KETOROLAC TROMETHAMINE 15 MG/ML IJ SOLN
15.0000 mg | Freq: Once | INTRAMUSCULAR | Status: AC
  Administered 2022-07-17: 15 mg via INTRAVENOUS
  Filled 2022-07-17: qty 1

## 2022-07-17 MED ORDER — ONDANSETRON HCL 4 MG/2ML IJ SOLN
4.0000 mg | Freq: Once | INTRAMUSCULAR | Status: AC
  Administered 2022-07-17: 4 mg via INTRAVENOUS
  Filled 2022-07-17: qty 2

## 2022-07-17 NOTE — ED Notes (Signed)
Patient is being discharged from the Urgent Care and sent to the Emergency Department via POV with family. Per Verna Czech, PA, patient is in need of higher level of care due to abdominal pain, vomiting, headache. Patient is aware and verbalizes understanding of plan of care.  Vitals:   07/17/22 1858  BP: (!) 162/101  Pulse: 83  Resp: (!) 24  Temp: 98 F (36.7 C)  SpO2: 98%

## 2022-07-17 NOTE — ED Triage Notes (Signed)
I'm just so sick. My blood pressure is high. My head is killing me. I've been throwing up since last night. My stomach hurts. I'm dizzy.

## 2022-07-17 NOTE — ED Provider Notes (Signed)
[facility] Provider Note   CSN: [id number] Arrival date & time: 07/17/22  1910     History {Add pertinent medical, surgical, social history, OB history to HPI:1} Chief Complaint  Patient presents with   Abdominal Pain    Erika [name] is a 67 y.o. female.   Abdominal Pain Patient presents with headache abdominal pain nausea and vomiting.  States she does feel sick.  States blood pressure is elevated.  Throwing up since last night and abdomen hurts.  States abdomen started last night.  Has a history of migraines years ago but does not have them recently.  Does have known small 3 mm intracranial aneurysm.  States it is just getting lodged.  No known sick contacts.  Pains began last night when she states she got some bad news about her friend and got upset.    Past Medical History:  Diagnosis Date   Anxiety    Arthritis    Bell's palsy    COPD (chronic obstructive pulmonary disease)    History of cardiac murmur as a child    Hyperlipidemia    Hypertension    Menopausal syndrome    Mild asthma    no inhalers since stopped smoking   Myocardial infarction    2020--pt states no damage   Neuroma of foot    OSA on CPAP    Wears glasses     Home Medications Prior to Admission medications   Medication Sig Start Date End Date Taking? Authorizing Provider  acetaminophen (TYLENOL) 650 MG CR tablet Take 650-1,300 mg by mouth every 8 (eight) hours as needed for pain.    [provider]  albuterol (VENTOLIN HFA) 108 (90 Base) MCG/ACT inhaler Inhale 2 puffs into the lungs every 4 (four) hours as needed for wheezing or shortness of breath. 04/11/21   Laurey Morale, MD  aspirin EC 81 MG tablet Take 1 tablet (81 mg total) by mouth daily. Swallow whole. 07/14/21   Dunn, Nedra Hai, PA-C  atorvastatin (LIPITOR) 40 MG tablet Take 1 tablet (40 mg total) by mouth daily. 01/23/22   Isaac Bliss, Rayford Halsted, MD  buPROPion (WELLBUTRIN XL) 150  MG 24 hr tablet TAKE 1 TABLET(150 MG) BY MOUTH DAILY 01/23/22   Isaac Bliss, Rayford Halsted, MD  cetirizine (ZYRTEC) 10 MG tablet Take 10 mg by mouth daily as needed for allergies.    [provider]  Cholecalciferol (VITAMIN D3 SUPER STRENGTH) 50 MCG (2000 UT) CAPS Take 2,000 Units by mouth every evening.    [provider]  citalopram (CELEXA) 40 MG tablet Take 1 tablet (40 mg total) by mouth at bedtime. 05/16/22   Isaac Bliss, Rayford Halsted, MD  clopidogrel (PLAVIX) 75 MG tablet TAKE 1 TABLET(75 MG) BY MOUTH DAILY 03/05/22   Isaac Bliss, Rayford Halsted, MD  cyanocobalamin (VITAMIN B12) 1000 MCG/ML injection INJECT 1 ML (1,000 MCG TOTAL) INTO THE MUSCLE EVERY 30 DAYS. Patient not taking: Reported on 05/31/2022 01/01/22   Isaac Bliss, Rayford Halsted, MD  diphenhydramine-acetaminophen (TYLENOL PM) 25-500 MG TABS tablet Take 1 tablet by mouth at bedtime as needed (sleep).    [provider]  furosemide (LASIX) 20 MG tablet TAKE 1 TABLET(20 MG) BY MOUTH DAILY Patient not taking: Reported on 05/31/2022 03/05/22   Isaac Bliss, Rayford Halsted, MD  hydrochlorothiazide (HYDRODIURIL) 25 MG tablet TAKE 1 TABLET(25 MG) BY MOUTH EVERY EVENING 01/23/22   Isaac Bliss, Rayford Halsted, MD  hydrOXYzine (VISTARIL) 25 MG capsule Take  1 capsule (25 mg total) by mouth at bedtime and may repeat dose one time if needed. 09/05/21   Derrill Center, NP  levothyroxine (SYNTHROID) 75 MCG tablet Take 1 tablet (75 mcg total) by mouth daily before breakfast. 01/23/22   Isaac Bliss, Rayford Halsted, MD  metoprolol succinate (TOPROL-XL) 25 MG 24 hr tablet TAKE 1/2 TABLET(12.5 MG) BY MOUTH DAILY 04/12/22   Isaac Bliss, Rayford Halsted, MD  nitroGLYCERIN (NITROSTAT) 0.4 MG SL tablet Place 1 tablet (0.4 mg total) under the tongue every 5 (five) minutes as needed for chest pain. Up to 3 times. 06/14/21 09/12/21  Tobb, Kardie, DO  polyvinyl alcohol (LIQUIFILM TEARS) 1.4 % ophthalmic solution Place 1 drop into both eyes 5  (five) times daily. Patient taking differently: Place 1 drop into both eyes 5 (five) times daily as needed for dry eyes. 01/04/21   Regalado, Belkys A, MD  SYRINGE-NEEDLE, DISP, 3 ML (BD SAFETYGLIDE SYRINGE/NEEDLE) 25G X 1" 3 ML MISC Use for B12 injections 05/06/20   Isaac Bliss, Rayford Halsted, MD      Allergies    Tape and Tetracycline hcl    Review of Systems   Review of Systems  Gastrointestinal:  Positive for abdominal pain.    Physical Exam Updated Vital Signs BP (!) 179/85   Pulse 95   Temp 97.8 F (36.6 C)   Resp 16   Ht 5\' 6"  (1.676 m)   Wt 111.1 kg   SpO2 100%   BMI 39.54 kg/m  Physical Exam Vitals and nursing note reviewed.  HENT:     Head: Atraumatic.  Cardiovascular:     Rate and Rhythm: Regular rhythm.  Pulmonary:     Breath sounds: Normal breath sounds.  Abdominal:     Hernia: No hernia is present.     Comments: Mild diffuse abdominal tenderness.  No rebound or guarding.  No hernia palpated.  Skin:    General: Skin is warm.     Capillary Refill: Capillary refill takes less than 2 seconds.  Neurological:     Mental Status: She is alert.     ED Results / Procedures / Treatments   Labs (all labs ordered are listed, but only abnormal results are displayed) Labs Reviewed  COMPREHENSIVE METABOLIC PANEL - Abnormal; Notable for the following components:      Result Value   CO2 20 (*)    Glucose, Bld 159 (*)    All other components within normal limits  CBC - Abnormal; Notable for the following components:   RBC 5.27 (*)    All other components within normal limits  SARS CORONAVIRUS 2 BY RT PCR  LIPASE, BLOOD  URINALYSIS, ROUTINE W REFLEX MICROSCOPIC    EKG None  Radiology CT HEAD WO CONTRAST (5MM)  Result Date: 07/17/2022 CLINICAL DATA:  Headache, vomiting, dizziness EXAM: CT HEAD WITHOUT CONTRAST TECHNIQUE: Contiguous axial images were obtained from the base of the skull through the vertex without intravenous contrast. RADIATION DOSE REDUCTION:  This exam was performed according to the departmental dose-optimization program which includes automated exposure control, adjustment of the mA and/or kV according to patient size and/or use of iterative reconstruction technique. COMPARISON:  01/02/2021 FINDINGS: Brain: No evidence of acute infarction, hemorrhage, mass, mass effect, or midline shift. No hydrocephalus or extra-axial fluid collection. Vascular: No hyperdense vessel. Skull: Negative for fracture or focal lesion. Sinuses/Orbits: No acute finding. Other: The mastoid air cells are well aerated. IMPRESSION: No acute intracranial process. Electronically Signed   By: Merilyn Baba  M.D.   On: 07/17/2022 21:19    Procedures Procedures  {Document cardiac monitor, telemetry assessment procedure when appropriate:1}  Medications Ordered in ED Medications  ondansetron (ZOFRAN) injection 4 mg (has no administration in time range)  ondansetron (ZOFRAN-ODT) disintegrating tablet 4 mg (4 mg Oral Given 07/17/22 1946)  prochlorperazine (COMPAZINE) injection 10 mg (10 mg Intravenous Given 07/17/22 2041)  ketorolac (TORADOL) 15 MG/ML injection 15 mg (15 mg Intravenous Given 07/17/22 2041)  fentaNYL (SUBLIMAZE) injection 50 mcg (50 mcg Intravenous Given 07/17/22 2128)    ED Course/ Medical Decision Making/ A&P   {   Click here for ABCD2, HEART and other calculatorsREFRESH Note before signing :1}                          Medical Decision Making Amount and/or Complexity of Data Reviewed Labs: ordered. Radiology: ordered.  Risk Prescription drug management.   Patient with headache abdominal pain nausea vomiting feeling bad.  States she feels chilled also.  Differential diagnoses include infection, intracranial hemorrhage, intra-abdominal pathology.  Will get stat head CT due to known aneurysm.  Although symptoms started last night.  Head CT independently interpreted reassuring.  Given migraine cocktail with some improvement of the headache but states she  is still nauseous.  Had Compazine initially along with Toradol.  Had nitroglycerin at small dose fentanyl.  States she is still nauseous.  {Document critical care time when appropriate:1} {Document review of labs and clinical decision tools ie heart score, Chads2Vasc2 etc:1}  {Document your independent review of radiology images, and any outside records:1} {Document your discussion with family members, caretakers, and with consultants:1} {Document social determinants of health affecting pt's care:1} {Document your decision making why or why not admission, treatments were needed:1} Final Clinical Impression(s) / ED Diagnoses Final diagnoses:  None    Rx / DC Orders ED Discharge Orders     None

## 2022-07-17 NOTE — ED Triage Notes (Signed)
Pt reports starting last night vomiting, headache, abd pain, dizzy spells, BP high. Has cough and constant urination.

## 2022-07-18 LAB — URINALYSIS, ROUTINE W REFLEX MICROSCOPIC
Bilirubin Urine: NEGATIVE
Glucose, UA: NEGATIVE mg/dL
Hgb urine dipstick: NEGATIVE
Ketones, ur: 20 mg/dL — AB
Leukocytes,Ua: NEGATIVE
Nitrite: NEGATIVE
Protein, ur: NEGATIVE mg/dL
Specific Gravity, Urine: 1.017 (ref 1.005–1.030)
pH: 7 (ref 5.0–8.0)

## 2022-07-18 MED ORDER — ONDANSETRON 4 MG PO TBDP: ORAL_TABLET | ORAL | 0 refills | Status: DC

## 2022-07-18 NOTE — ED Provider Notes (Signed)
Patient signed out to me by Dr. Alvino Chapel.  Patient awaiting CT abdomen and pelvis at time of signout.  Patient asked to speak to me about the delay in obtaining a CT scan.  Unfortunately, patient has been waiting several hours and the CT scan has not been performed.  Patient indicating that she no longer wants to wait for the scan.  She reports that she still has some mild nausea but her pain is improved.  She indicates that she would prefer to follow-up with her primary care doctor.  Repeat examination reveals a benign abdominal exam.  I did review her labs and she did not have a leukocytosis, comprehensive panel was normal, urinalysis normal.  She will be discharged, follow-up with PCP.  She was given return precautions and expressed understanding.   Orpah Greek, MD 07/18/22 0230

## 2022-07-23 ENCOUNTER — Telehealth: Payer: Self-pay

## 2022-07-23 NOTE — Telephone Encounter (Signed)
a    Patient  visit on 07/18/2022  at [facility] was for abdominal pain.  Have you been able to follow up with your primary care physician? Patient plans to follow up this week.  The patient was or was not able to obtain any needed medicine or equipment. Patient was able to obtain medication.  Are there diet recommendations that you are having difficulty following? No  Patient expresses understanding of discharge instructions and education provided has no other needs at this time. Yes   Adrean Findlay [facility]  [facility] Community Resource Care Guide   [email or web address]  ?? [phone]   [email or web address]: [email or web address]  [email or web address]

## 2022-07-31 ENCOUNTER — Ambulatory Visit: Payer: Medicare Other | Admitting: Internal Medicine

## 2022-08-02 ENCOUNTER — Ambulatory Visit
Admission: RE | Admit: 2022-08-02 | Discharge: 2022-08-02 | Disposition: A | Discharge status: Home / Self Care | Payer: Medicare Other | Source: Ambulatory Visit | Attending: Internal Medicine | Admitting: Internal Medicine

## 2022-08-02 ENCOUNTER — Ambulatory Visit (INDEPENDENT_AMBULATORY_CARE_PROVIDER_SITE_OTHER): Payer: Medicare Other | Admitting: Internal Medicine

## 2022-08-02 ENCOUNTER — Encounter: Payer: Self-pay | Admitting: Internal Medicine

## 2022-08-02 VITALS — BP 120/70 | HR 80 | Temp 97.7°F | Wt 244.6 lb

## 2022-08-02 DIAGNOSIS — E538 Deficiency of other specified B group vitamins: Secondary | ICD-10-CM

## 2022-08-02 DIAGNOSIS — E782 Mixed hyperlipidemia: Secondary | ICD-10-CM | POA: Diagnosis not present

## 2022-08-02 DIAGNOSIS — I10 Essential (primary) hypertension: Secondary | ICD-10-CM | POA: Diagnosis not present

## 2022-08-02 DIAGNOSIS — I2510 Atherosclerotic heart disease of native coronary artery without angina pectoris: Secondary | ICD-10-CM

## 2022-08-02 DIAGNOSIS — F3342 Major depressive disorder, recurrent, in full remission: Secondary | ICD-10-CM | POA: Diagnosis not present

## 2022-08-02 DIAGNOSIS — E039 Hypothyroidism, unspecified: Secondary | ICD-10-CM

## 2022-08-02 DIAGNOSIS — Z1231 Encounter for screening mammogram for malignant neoplasm of breast: Secondary | ICD-10-CM

## 2022-08-02 DIAGNOSIS — E559 Vitamin D deficiency, unspecified: Secondary | ICD-10-CM | POA: Diagnosis not present

## 2022-08-02 DIAGNOSIS — R5383 Other fatigue: Secondary | ICD-10-CM

## 2022-08-02 DIAGNOSIS — F332 Major depressive disorder, recurrent severe without psychotic features: Secondary | ICD-10-CM

## 2022-08-02 DIAGNOSIS — F411 Generalized anxiety disorder: Secondary | ICD-10-CM

## 2022-08-02 LAB — VITAMIN D 25 HYDROXY (VIT D DEFICIENCY, FRACTURES): VITD: 32.03 ng/mL (ref 30.00–100.00)

## 2022-08-02 LAB — LDL CHOLESTEROL, DIRECT: Direct LDL: 76 mg/dL

## 2022-08-02 LAB — VITAMIN B12: Vitamin B-12: 263 pg/mL (ref 211–911)

## 2022-08-02 LAB — TSH: TSH: 6.27 u[IU]/mL — ABNORMAL HIGH (ref 0.35–5.50)

## 2022-08-02 LAB — LIPID PANEL
Cholesterol: 151 mg/dL (ref 0–200)
HDL: 47.6 mg/dL (ref >39.00)
NonHDL: 102.97
Total CHOL/HDL Ratio: 3
Triglycerides: 364 mg/dL — ABNORMAL HIGH (ref 0.0–149.0)
VLDL: 72.8 mg/dL — ABNORMAL HIGH (ref 0.0–40.0)

## 2022-08-02 MED ORDER — CLOPIDOGREL BISULFATE 75 MG PO TABS: ORAL_TABLET | ORAL | 1 refills | Status: DC

## 2022-08-02 MED ORDER — HYDROXYZINE PAMOATE 25 MG PO CAPS: 25.0000 mg | ORAL_CAPSULE | Freq: Every evening | ORAL | 0 refills | Status: DC | PRN

## 2022-08-02 MED ORDER — CITALOPRAM HYDROBROMIDE 40 MG PO TABS
40.0000 mg | ORAL_TABLET | Freq: Every day | ORAL | 1 refills | Status: DC
Start: 2022-08-02 — End: 2023-05-06

## 2022-08-02 MED ORDER — LEVOTHYROXINE SODIUM 75 MCG PO TABS
75.0000 ug | ORAL_TABLET | Freq: Every day | ORAL | 1 refills | Status: DC
Start: 2022-08-02 — End: 2023-05-06

## 2022-08-02 MED ORDER — HYDROCHLOROTHIAZIDE 25 MG PO TABS
ORAL_TABLET | ORAL | 1 refills | Status: DC
Start: 2022-08-02 — End: 2023-01-24

## 2022-08-02 MED ORDER — METOPROLOL SUCCINATE ER 25 MG PO TB24: ORAL_TABLET | ORAL | 1 refills | Status: DC

## 2022-08-02 MED ORDER — ATORVASTATIN CALCIUM 40 MG PO TABS: 40.0000 mg | ORAL_TABLET | Freq: Every day | ORAL | 1 refills | Status: DC

## 2022-08-02 MED ORDER — BUPROPION HCL ER (XL) 150 MG PO TB24
ORAL_TABLET | ORAL | 1 refills | Status: DC
Start: 2022-08-02 — End: 2023-02-04

## 2022-08-02 MED ORDER — NITROGLYCERIN 0.4 MG SL SUBL: 0.4000 mg | SUBLINGUAL_TABLET | SUBLINGUAL | 3 refills | Status: AC | PRN

## 2022-08-02 NOTE — Progress Notes (Signed)
Established Patient Office Visit     CC/Reason for Visit: ED follow-up, follow-up chronic conditions  HPI: Erika [name] is a 67 y.o. female who is coming in today for the above mentioned reasons. Past Medical History is significant for: Hypertension, hyperlipidemia, vitamin B12 deficiency, depression, obstructive sleep apnea, hypothyroidism, asthma, coronary artery disease.  She was seen in the emergency department on 4/2 with a severe migraine with nausea and vomiting.  They wanted to do a CT scan of the head but she left before this was done.  She quit taking all of her medication for reasons that are unclear.  Amongst these medications are her Plavix, cardiac medications, antidepressants as well as her thyroid supplement.  She quit taking these months ago.   Past Medical/Surgical History: Past Medical History:  Diagnosis Date   Anxiety    Arthritis    Bell's palsy    COPD (chronic obstructive pulmonary disease)    History of cardiac murmur as a child    Hyperlipidemia    Hypertension    Menopausal syndrome    Mild asthma    no inhalers since stopped smoking   Myocardial infarction    2020--pt states no damage   Neuroma of foot    OSA on CPAP    Wears glasses     Past Surgical History:  Procedure Laterality Date   CORONARY IMAGING/OCT N/A 07/14/2021   Procedure: INTRAVASCULAR IMAGING/OCT;  Surgeon: Orbie Pyo, MD;  Location: [facility];  Service: Cardiovascular;  Laterality: N/A;   CORONARY PRESSURE/FFR STUDY N/A 07/14/2021   Procedure: INTRAVASCULAR PRESSURE WIRE/FFR STUDY;  Surgeon: Orbie Pyo, MD;  Location: [facility];  Service: Cardiovascular;  Laterality: N/A;   CORONARY STENT INTERVENTION N/A 07/14/2021   Procedure: CORONARY STENT INTERVENTION;  Surgeon: Orbie Pyo, MD;  Location: [facility];  Service: Cardiovascular;  Laterality: N/A;   ELBOW SURGERY Right 2002   repair tendon and nerve injury   EXCISION MORTON'S  NEUROMA Left 2010   LEFT HEART CATH AND CORONARY ANGIOGRAPHY N/A 07/14/2021   Procedure: LEFT HEART CATH AND CORONARY ANGIOGRAPHY;  Surgeon: Orbie Pyo, MD;  Location: [facility];  Service: Cardiovascular;  Laterality: N/A;   ROBOTIC ASSISTED TOTAL HYSTERECTOMY WITH BILATERAL SALPINGO OOPHERECTOMY N/A 12/31/2017   Procedure: XI ROBOTIC ASSISTED TOTAL HYSTERECTOMY WITH BILATERAL SALPINGO OOPHORECTOMY;  Surgeon: Adolphus Birchwood, MD;  Location: [facility];  Service: Gynecology;  Laterality: N/A;   SHOULDER ARTHROSCOPY W/ ROTATOR CUFF REPAIR Left 09/2016   and burectomy    Social History:  reports that she quit smoking about 22 years ago. Her smoking use included cigarettes. She has a 30.00 pack-year smoking history. She has never used smokeless tobacco. She reports that she does not drink alcohol and does not use drugs.  Allergies: Allergies  Allergen Reactions   Tape Other (See Comments)    PATIENT PREFERS CLOTH TAPE- THE OTHERS DON'T AGREE WITH HER SKIN   Tetracycline Hcl Rash    Family History:  Family History  Problem Relation Age of Onset   Depression Mother    Diabetes Mother    Heart disease Mother    Asthma Sister    Cancer Maternal Aunt    Cancer Maternal Uncle    Uterine cancer Maternal Uncle    Breast cancer Paternal Aunt    Diabetes Maternal Grandmother    Diabetes Maternal Grandfather    Cancer Paternal Grandmother    Cancer Brother  Colon cancer Neg Hx    Colon polyps Neg Hx    Esophageal cancer Neg Hx    Rectal cancer Neg Hx    Stomach cancer Neg Hx      Current Outpatient Medications:    acetaminophen (TYLENOL) 650 MG CR tablet, Take 650-1,300 mg by mouth every 8 (eight) hours as needed for pain., Disp: , Rfl:    Cholecalciferol (VITAMIN D3 SUPER STRENGTH) 50 MCG (2000 UT) CAPS, Take 2,000 Units by mouth every evening., Disp: , Rfl:    diphenhydramine-acetaminophen (TYLENOL PM) 25-500 MG TABS tablet, Take 1 tablet by mouth at bedtime as needed  (sleep)., Disp: , Rfl:    atorvastatin (LIPITOR) 40 MG tablet, Take 1 tablet (40 mg total) by mouth daily., Disp: 90 tablet, Rfl: 1   buPROPion (WELLBUTRIN XL) 150 MG 24 hr tablet, TAKE 1 TABLET(150 MG) BY MOUTH DAILY, Disp: 90 tablet, Rfl: 1   citalopram (CELEXA) 40 MG tablet, Take 1 tablet (40 mg total) by mouth at bedtime., Disp: 90 tablet, Rfl: 1   clopidogrel (PLAVIX) 75 MG tablet, TAKE 1 TABLET(75 MG) BY MOUTH DAILY, Disp: 90 tablet, Rfl: 1   cyanocobalamin (VITAMIN B12) 1000 MCG/ML injection, INJECT 1 ML (1,000 MCG TOTAL) INTO THE MUSCLE EVERY 30 DAYS. (Patient not taking: Reported on 08/02/2022), Disp: 1 mL, Rfl: 0   hydrochlorothiazide (HYDRODIURIL) 25 MG tablet, TAKE 1 TABLET(25 MG) BY MOUTH EVERY EVENING, Disp: 90 tablet, Rfl: 1   hydrOXYzine (VISTARIL) 25 MG capsule, Take 1 capsule (25 mg total) by mouth at bedtime and may repeat dose one time if needed., Disp: 60 capsule, Rfl: 0   levothyroxine (SYNTHROID) 75 MCG tablet, Take 1 tablet (75 mcg total) by mouth daily before breakfast., Disp: 90 tablet, Rfl: 1   metoprolol succinate (TOPROL-XL) 25 MG 24 hr tablet, TAKE 1/2 TABLET(12.5 MG) BY MOUTH DAILY, Disp: 45 tablet, Rfl: 1   nitroGLYCERIN (NITROSTAT) 0.4 MG SL tablet, Place 1 tablet (0.4 mg total) under the tongue every 5 (five) minutes as needed for chest pain. Up to 3 times., Disp: 90 tablet, Rfl: 3   SYRINGE-NEEDLE, DISP, 3 ML (BD SAFETYGLIDE SYRINGE/NEEDLE) 25G X 1" 3 ML MISC, Use for B12 injections (Patient not taking: Reported on 08/02/2022), Disp: 100 each, Rfl: 11  Review of Systems:  Negative unless indicated in HPI.   Physical Exam: Vitals:   08/02/22 1413  BP: 120/70  Pulse: 80  Temp: 97.7 F (36.5 C)  TempSrc: Oral  SpO2: 98%  Weight: 244 lb 9.6 oz (110.9 kg)    Body mass index is 39.48 kg/m.   Physical Exam Vitals reviewed.  Constitutional:      Appearance: Normal appearance.  HENT:     Head: Normocephalic and atraumatic.  Eyes:     Conjunctiva/sclera:  Conjunctivae normal.     Pupils: Pupils are equal, round, and reactive to light.  Cardiovascular:     Rate and Rhythm: Normal rate and regular rhythm.  Pulmonary:     Effort: Pulmonary effort is normal.     Breath sounds: Normal breath sounds.  Skin:    General: Skin is warm and dry.  Neurological:     General: No focal deficit present.     Mental Status: She is alert and oriented to person, place, and time.  Psychiatric:        Mood and Affect: Mood is depressed. Affect is tearful.        Behavior: Behavior normal.        Thought Content:  Thought content normal.        Judgment: Judgment normal.      Impression and Plan:  Acquired hypothyroidism - Plan: TSH, TSH, AMB Referral to Community Care Coordinaton ([facility] Patients)  Mixed hyperlipidemia - Plan: Lipid panel, Lipid panel, AMB Referral to Community Care Coordinaton ([facility] Patients)  Coronary artery disease involving native coronary artery of native heart without angina pectoris - Plan: AMB Referral to Community Care Coordinaton ([facility] Patients)  B12 deficiency - Plan: Vitamin B12, Vitamin B12, AMB Referral to Community Care Coordinaton ([facility] Patients)  Primary hypertension - Plan: AMB Referral to Community Care Coordinaton ([facility] Patients)  Fatigue, unspecified type - Plan: AMB Referral to Community Care Coordinaton ([facility] Patients)  Vitamin D deficiency - Plan: VITAMIN D 25 Hydroxy (Vit-D Deficiency, Fractures), VITAMIN D 25 Hydroxy (Vit-D Deficiency, Fractures), AMB Referral to Community Care Coordinaton ([facility] Patients)  Severe episode of recurrent major depressive disorder, without psychotic features - Plan: AMB Referral to Community Care Coordinaton ([facility] Patients)  Anxiety state - Plan: buPROPion (WELLBUTRIN XL) 150 MG 24 hr tablet, AMB Referral to Community Care Coordinaton ([facility] Patients)  DEPRESSIVE DSORDER, RCR, FULL REMISSION - Plan: citalopram (CELEXA) 40 MG tablet, AMB Referral to Community Care Coordinaton ([facility]  Patients)  Essential hypertension - Plan: hydrochlorothiazide (HYDRODIURIL) 25 MG tablet, AMB Referral to Community Care Coordinaton ([facility] Patients)  Hypothyroidism, unspecified type - Plan: levothyroxine (SYNTHROID) 75 MCG tablet, AMB Referral to Community Care Coordinaton ([facility] Patients)  -Have advised that she resume all medication and return in 3 months for follow-up.  I assume she will be feeling much better then especially when she resumes her depression thyroid medication.  Check TSH, B12 and vitamin D levels today.  Time spent:33 minutes reviewing chart, interviewing and examining patient and formulating plan of care.     Chaya Jan, MD [facility] at [facility]

## 2022-08-06 ENCOUNTER — Other Ambulatory Visit: Payer: Self-pay | Admitting: Internal Medicine

## 2022-08-06 DIAGNOSIS — E039 Hypothyroidism, unspecified: Secondary | ICD-10-CM

## 2022-08-08 ENCOUNTER — Other Ambulatory Visit: Payer: Self-pay | Admitting: Internal Medicine

## 2022-08-16 ENCOUNTER — Other Ambulatory Visit: Payer: Self-pay | Admitting: *Deleted

## 2022-08-16 DIAGNOSIS — E538 Deficiency of other specified B group vitamins: Secondary | ICD-10-CM

## 2022-08-16 DIAGNOSIS — E039 Hypothyroidism, unspecified: Secondary | ICD-10-CM

## 2022-08-16 MED ORDER — CYANOCOBALAMIN 1000 MCG/ML IJ SOLN
INTRAMUSCULAR | 11 refills | Status: AC
Start: 2022-08-16 — End: ?

## 2022-08-28 ENCOUNTER — Telehealth: Payer: Self-pay

## 2022-08-28 NOTE — Progress Notes (Signed)
Care Management & Coordination Services Pharmacy Team  Reason for Encounter: Appointment Reminder  Contacted patient to confirm in office appointment with Delano Metz, PharmD on 09/05/2022 at 9:00. Spoke with patient on 08/28/2022   Have you seen any other providers since your last visit? **Patient denies  Any changes in your medications or health? Patient denies  Any side effects from any medications? Patient states she has been having dizzy spells,she read some of her medications have dizziness as a side effect.   Do you have an symptoms or problems not managed by your medications? Patient denies  Any concerns about your health right now? Patient denies  Has your provider asked that you check blood pressure, blood sugar, or follow special diet at home? Patient denies  Do you get any type of exercise on a regular basis? Patient denies  Can you think of a goal you would like to reach for your health? Patient would like to get healthier, loose some weight and not be so tired all the time.   Do you have any problems getting your medications? Patient denies  Is there anything that you would like to discuss during the appointment? Patient states she has been short tempered recently, usnsure as to what is causing this.   Please bring medications and supplements to appointment   Chart review:  Recent office visits:  08/02/2022 Philip Aspen, Limmie Patricia, MD - Patient was seen for Acquired hypothyroidism and additional concerns. Discontinued Albuterol HFA, Aspirin, Cetirizine, Furosemide, Ondansetron and Liquifilm tears.   Recent consult visits:  05/31/2022 Cyril Mourning (pulmonary) - Patient was seen for DOE and additional concerns. No medication changes.   04/14/2023 Larey Dresser (foot and ankle) - Patient was seen for Difficulty in walking, not elsewhere classified. No additional chart notes.   Hospital visits:  Patient was seen at [facility] ED on 07/17/2022 (7 hours) due to  generalized abdominal pain.    New?Medications Started at [facility] Discharge:?? ondansetron 4 MG disintegrating tablet Medication Changes at Hospital Discharge: None Medications Discontinued at Hospital Discharge: None Medications that remain the same after Hospital Discharge:??  -All other medications will remain the same.     Patient was seen at [facility] on 07/17/2022 (14 min) .   New?Medications Started at [facility] Discharge:?? None Medication Changes at Hospital Discharge: None Medications Discontinued at Hospital Discharge: None Medications that remain the same after Hospital Discharge:??  -All other medications will remain the same.     Patient was seen at [facility] ED on 04/01/2022 (1 min) due to foot pain.    New?Medications Started at [facility] Discharge:?? None Medication Changes at Hospital Discharge: None Medications Discontinued at Hospital Discharge: None Medications that remain the same after Hospital Discharge:??  -All other medications will remain the same.     Care Gaps: AWV - completed 01/17/2022 Last BP - 120/70 on 08/02/2022 Hep C Screen - never done Colonoscopy - overdue Dexa scan - never done Covid - overdue  Star Rating Drugs:  Atorvastatin 40 mg - last filled 08/18/2022 90 DS at [facility]  Inetta Fermo [facility]  Clinical Pharmacist Assistant [phone]

## 2022-09-03 NOTE — Progress Notes (Unsigned)
Care Management & Coordination Services Pharmacy Note  09/05/2022 Name:  Erika [name] MRN:  [id number] DOB:  1955-06-28  Summary: BP at goal <130/80 LDL not at goal <70, TG not at goal <150 C/o of feeling very stressed and anxious with current life stressors Due for DEXA scan, order previously placed, pt never scheduled  Recommendations/Changes made from today's visit: -Counseled to monitor BP at home if feeling symptoms of low/high BP and reviewed those symptoms -Counseled to limit/avoid fried, fatty foods -START Fish Oil OTC to help with TG levels -ORDER referral to behavioral health with PCP approval -Provided contact info to imaging center to schedule DEXA  Follow up plan: PCP visit in July General review in August Pharmacist visit in December   Subjective: Erika [name] is an 67 y.o. year old female who is a primary patient of Philip Aspen, Limmie Patricia, MD.  The care coordination team was consulted for assistance with disease management and care coordination needs.    Engaged with patient face to face for initial visit. Patient brings all of her medications to the appt. Primary health concern at this time is dizziness that she thinks is being triggered by stress at home (her husband's 78 year old son has moved in with them and she is very stressed about it). Drives herself to appts and errands.  Recent office visits: 08/02/2022 Philip Aspen, Limmie Patricia, MD - Patient was seen for Acquired hypothyroidism and additional concerns. Discontinued Albuterol HFA, Aspirin, Cetirizine, Furosemide, Ondansetron and Liquifilm tears.   Recent consult visits: 05/31/2022 Cyril Mourning (pulmonary) - Patient was seen for DOE and additional concerns. No medication changes.    04/14/2023 Larey Dresser (foot and ankle) - Patient was seen for Difficulty in walking, not elsewhere classified. No additional chart notes.   Hospital visits: 07/17/22 [facility] ED - For generalized abdominal pain,  LOS 7 hours, Start Zofran   Objective:  Lab Results  Component Value Date   CREATININE 0.91 07/17/2022   BUN 11 07/17/2022   GFR 66.36 01/17/2022   EGFR 71 07/19/2021   GFRNONAA >60 07/17/2022   GFRAA >60 12/26/2017   NA 137 07/17/2022   K 3.9 07/17/2022   CALCIUM 9.7 07/17/2022   CO2 20 (L) 07/17/2022   GLUCOSE 159 (H) 07/17/2022    Lab Results  Component Value Date/Time   HGBA1C 5.7 05/05/2020 09:43 AM   HGBA1C 5.7 08/19/2018 08:56 AM   GFR 66.36 01/17/2022 08:21 AM   GFR 71.93 05/05/2020 09:43 AM    Last diabetic Eye exam: No results found for: "HMDIABEYEEXA"  Last diabetic Foot exam: No results found for: "HMDIABFOOTEX"   Lab Results  Component Value Date   CHOL 151 08/02/2022   HDL 47.60 08/02/2022   LDLCALC 70 01/17/2022   LDLDIRECT 76.0 08/02/2022   TRIG 364.0 (H) 08/02/2022   CHOLHDL 3 08/02/2022       Latest Ref Rng & Units 07/17/2022    7:52 PM 01/17/2022    8:21 AM 01/02/2021   10:22 AM  Hepatic Function  Total Protein 6.5 - 8.1 g/dL 7.6  7.5  7.0   Albumin 3.5 - 5.0 g/dL 3.8  4.2  3.8   AST 15 - 41 U/L 26  15  22    ALT 0 - 44 U/L 22  13  21    Alk Phosphatase 38 - 126 U/L 85  90  78   Total Bilirubin 0.3 - 1.2 mg/dL 0.7  0.4  0.5     Lab Results  Component Value Date/Time   TSH 6.27 (H) 08/02/2022 02:31 PM   TSH 2.80 05/05/2020 09:43 AM   FREET4 0.72 06/14/2014 09:14 AM       Latest Ref Rng & Units 07/17/2022    7:52 PM 01/17/2022    8:21 AM 07/11/2021    8:12 AM  CBC  WBC 4.0 - 10.5 K/uL 9.5  7.7  7.7   Hemoglobin 12.0 - 15.0 g/dL 16.1  09.6  04.5   Hematocrit 36.0 - 46.0 % 42.2  38.6  39.2   Platelets 150 - 400 K/uL 379  334.0  360     Lab Results  Component Value Date/Time   VD25OH 32.03 08/02/2022 02:31 PM   VD25OH 42.48 09/22/2020 07:59 AM   VITAMINB12 263 08/02/2022 02:31 PM   VITAMINB12 406 01/17/2022 08:21 AM    Clinical ASCVD: Yes  The 10-year ASCVD risk score (Arnett DK, et al., 2019) is: 7.5%   Values used to calculate  the score:     Age: 67 years     Sex: Female     Is Non-Hispanic African American: No     Diabetic: No     Tobacco smoker: No     Systolic Blood Pressure: 120 mmHg     Is BP treated: Yes     HDL Cholesterol: 47.6 mg/dL     Total Cholesterol: 151 mg/dL       07/23/8117    1:47 PM 01/17/2022    8:28 AM 11/29/2021    1:35 PM  Depression screen PHQ 2/9  Decreased Interest 3 2 1   Down, Depressed, Hopeless 3 1 1   PHQ - 2 Score 6 3 2   Altered sleeping 3 1 2   Tired, decreased energy 3 1 2   Change in appetite 2 1 1   Feeling bad or failure about yourself  2 1 1   Trouble concentrating 3 1 1   Moving slowly or fidgety/restless  0 1  Suicidal thoughts 2 0 0  PHQ-9 Score 21 8 10   Difficult doing work/chores  Somewhat difficult      Social History   Tobacco Use  Smoking Status Former   Packs/day: 1.00   Years: 30.00   Additional pack years: 0.00   Total pack years: 30.00   Types: Cigarettes   Quit date: 06/01/2000   Years since quitting: 22.2  Smokeless Tobacco Never   BP Readings from Last 3 Encounters:  08/02/22 120/70  07/18/22 (!) 156/80  07/17/22 (!) 162/101   Pulse Readings from Last 3 Encounters:  08/02/22 80  07/18/22 90  07/17/22 83   Wt Readings from Last 3 Encounters:  08/02/22 244 lb 9.6 oz (110.9 kg)  07/17/22 245 lb (111.1 kg)  05/31/22 246 lb (111.6 kg)   BMI Readings from Last 3 Encounters:  08/02/22 39.48 kg/m  07/17/22 39.54 kg/m  05/31/22 40.01 kg/m    Allergies  Allergen Reactions   Tape Other (See Comments)    PATIENT PREFERS CLOTH TAPE- THE OTHERS DON'T AGREE WITH HER SKIN   Tetracycline Hcl Rash    Medications Reviewed Today     Reviewed by Sherrill Raring, RPH (Pharmacist) on 09/05/22 at 0954  Med List Status: <None>   Medication Order Taking? Sig Documenting Provider Last Dose Status Informant  acetaminophen (TYLENOL) 650 MG CR tablet [id number] No Take 650-1,300 mg by mouth every 8 (eight) hours as needed for pain. [provider] Taking Active Self  atorvastatin (LIPITOR) 40 MG tablet [id number]  Take 1 tablet (40  mg total) by mouth daily. Philip Aspen, Limmie Patricia, MD  Active   buPROPion (WELLBUTRIN XL) 150 MG 24 hr tablet [id number]  TAKE 1 TABLET(150 MG) BY MOUTH DAILY Philip Aspen, Limmie Patricia, MD  Active   Cholecalciferol (VITAMIN D3 SUPER STRENGTH) 50 MCG (2000 UT) CAPS [id number] No Take 2,000 Units by mouth every evening. [provider] Taking Active Self           Med Note XITLALLY, VANDERLUGT   Wed Sep 05, 2022  9:54 AM) Taking 2 daily  citalopram (CELEXA) 40 MG tablet [id number]  Take 1 tablet (40 mg total) by mouth at bedtime. Philip Aspen, Limmie Patricia, MD  Active   clopidogrel (PLAVIX) 75 MG tablet [id number]  TAKE 1 TABLET(75 MG) BY MOUTH DAILY Philip Aspen, Limmie Patricia, MD  Active   cyanocobalamin (VITAMIN B12) 1000 MCG/ML injection [id number]  INJECT 1 ML (1,000 MCG TOTAL) INTO THE MUSCLE once a week for 1 month.  Then inject 1 ml once a month thereafter. Philip Aspen, Limmie Patricia, MD  Active   diphenhydramine-acetaminophen (TYLENOL PM) 25-500 MG TABS tablet [id number] No Take 1 tablet by mouth at bedtime as needed (sleep). [provider] Taking Active Self  hydrochlorothiazide (HYDRODIURIL) 25 MG tablet [id number]  TAKE 1 TABLET(25 MG) BY MOUTH EVERY EVENING Philip Aspen, Limmie Patricia, MD  Active   hydrOXYzine (VISTARIL) 25 MG capsule [id number]  Take 1 capsule (25 mg total) by mouth at bedtime and may repeat dose one time if needed. Philip Aspen, Limmie Patricia, MD  Active   levothyroxine (SYNTHROID) 75 MCG tablet [id number]  Take 1 tablet (75 mcg total) by mouth daily before breakfast. Philip Aspen, Limmie Patricia, MD  Active   metoprolol succinate (TOPROL-XL) 25 MG 24 hr tablet [id number]  TAKE 1/2 TABLET(12.5 MG) BY MOUTH DAILY Philip Aspen, Limmie Patricia, MD  Active   nitroGLYCERIN (NITROSTAT) 0.4 MG SL tablet [id number]  Place 1 tablet (0.4 mg total) under the tongue every 5 (five)  minutes as needed for chest pain. Up to 3 times. Philip Aspen, Limmie Patricia, MD  Active   SYRINGE-NEEDLE, DISP, 3 ML (BD SAFETYGLIDE SYRINGE/NEEDLE) 25G X 1" 3 ML MISC [id number] No Use for B12 injections  Patient not taking: Reported on 08/02/2022   Philip Aspen, Limmie Patricia, MD Not Taking Active Self            SDOH:  (Social Determinants of Health) assessments and interventions performed: Yes SDOH Interventions    Flowsheet Row Care Coordination from 09/05/2022 in [facility] Office Visit from 11/29/2021 in [facility] [place] Counselor from 09/18/2021 in confidential department Counselor from 08/30/2021 in confidential department Counselor from 08/24/2021 in confidential department Office Visit from 08/22/2021 in [facility] [place]  SDOH Interventions        Food Insecurity Interventions Intervention Not Indicated -- -- -- -- --  Housing Interventions Intervention Not Indicated -- -- -- -- --  Depression Interventions/Treatment  -- Medication Currently on Treatment Currently on Treatment Counseling, Medication Medication       Medication Assistance: None required.  Patient affirms current coverage meets needs.  Medication Access: Name and location of current pharmacy:  [id number] - [place], [place] - [address] AT [facility] [address] [place] [address] Phone: [phone] Fax: [phone]  [facility] [address] [place] [address] Phone: [phone] Fax: [phone]  Within the past 30 days, how  often has patient missed a dose of medication? None Is a pillbox or other method used to improve adherence? Yes  - keeps meds in a tier of when she takes them Factors that may affect medication adherence?  Pill burden Are meds synced by current pharmacy? No  Are meds delivered by current pharmacy? No  Does patient experience delays  in picking up medications due to transportation concerns? No   Compliance/Adherence/Medication fill history: Care Gaps: AWV - completed 01/17/2022 Last BP - 120/70 on 08/02/2022 Hep C Screen - never done Colonoscopy - overdue Dexa scan - never done - order already placed, reminded patient and provided her with phone number to call Cov[facility] - overdue  [facility]: Atorvastatin 40 mg - [facility]    Assessment/Plan Hypertension (BP goal <130/80) -Controlled -Current treatment: Hydrochlorothiazide 25mg  1 qd Appropriate, Effective, Safe, Accessible Metoprolol XL 25mg  1/2 tab qd Appropriate, Effective, Safe, Accessible -Medications previously tried: Lasix  -Current home readings: has a machine but not checking at home -Current dietary habits: mindful of salt intake -Current exercise habits: admits to none other than walking during errands -Reports hypotensive/hypertensive symptoms --has been having dizziness that she is attributing to vertigo, has not been checking BP at these times -Educated on BP goals and benefits of medications for prevention of heart attack, stroke and kidney damage; Daily salt intake goal < 2300 mg; Exercise goal of 150 minutes per week; Importance of home blood pressure monitoring; Proper BP monitoring technique; Symptoms of hypotension and importance of maintaining adequate hydration; -Counseled to monitor BP at home weekly or if symptomatic, document, and provide log at future appointments -Recommended to continue current medication, did counsel patient that she can switch her HCTZ to the morning to help limit nighttime urination needs but she prefers to keep at night  Hyperlipidemia: (LDL goal < 70, TG <150) -Not ideally controlled -Current treatment: Atorvastatin 40mg  1 qd Appropriate, Query Effective -Medications previously tried: None  -Current dietary patterns: admits to eating lots of fried food and loving bacon -Current  exercise habits: see above -Educated on Cholesterol goals;  Benefits of statin for ASCVD risk reduction; Importance of limiting foods high in cholesterol; Exercise goal of 150 minutes per week; -Counseled on diet and exercise extensively Recommended to START OTC Fish Oil 1000mg  BID to help with TG levels  Heart Failure (Goal: manage symptoms and prevent exacerbations) -Not assessed today -Last ejection fraction: 60-65% (Date: 03/12/21) -HF type: HFpEF (EF > 50%) -NYHA Class: II (slight limitation of activity) -AHA HF Stage: C (Heart disease and symptoms present) -Current treatment: See HTN section  CAD (Goal: Slow progression of atherosclerosis (plaques / blockages) throughout your body to reduce risk of heart attack and strokes) -Not assessed today Current Medication Therapy: Plavix 75mg  1 qd Appropriate, Effective, Safe, Accessible Nitroglycerin 0.4mg  prn Appropriate, Effective, Safe, Accessible  Depression/Anxiety (Goal: Well-controlled mood that still allows for ADLs) -Not assessed today -Current treatment: Bupropion XL 150mg  1 qd Appropriate, Effective, Safe, Accessible Citalopram 40mg  1 qd Appropriate, Effective, Safe, Accessible Hydroxyzine 25mg  1 at bedtime and may repeat once if needed Appropriate, Effective, Safe, Accessible -Patient does request a referral to behavioral health to discuss her current life stress, requested from PCP  Hypothyroidism (Goal: TSH WNL) -Not assessed today -Current treatment  Levothyroxine 1 qd Appropriate, Effective, Safe, Accessible  Vit B12 Def (Goal: B12 WNL) -Not assessed today -Current treatment  Vit B12 injection once a week x 1 month, then once monthly Appropriate, Effective,  Safe, Accessible OTC  -Current treatment  Acetaminophen 650mg  1-2 tabs q8h prn pain Appropriate, Effective, Safe, Accessible Vit D 2000 units 1 qd Appropriate, Effective, Safe, Accessible Tylenol PM 25-500mg  1 qhs prn Query appropriate - pt will stop  taking   Sherrill Raring Clinical Pharmacist [phone]

## 2022-09-05 ENCOUNTER — Ambulatory Visit: Payer: Medicare Other

## 2022-09-05 ENCOUNTER — Telehealth: Payer: Self-pay | Admitting: *Deleted

## 2022-09-05 DIAGNOSIS — F3342 Major depressive disorder, recurrent, in full remission: Secondary | ICD-10-CM

## 2022-09-05 NOTE — Telephone Encounter (Signed)
Okay to place? 

## 2022-09-05 NOTE — Telephone Encounter (Signed)
-----   Message from Sherrill Raring, [facility] sent at 09/05/2022 10:12 AM EDT ----- Regarding: Behavioral Health Referral Hello,  Just had a visit with patient and she is requesting a referral to behavioral health due to a lot of stress in her life at the moment, if one could be placed please?  She wants to make sure it is for in-person visits and not telehealth if possible.  Thank you, Sherrill Raring Clinical Pharmacist [phone]

## 2022-09-05 NOTE — Telephone Encounter (Signed)
Referral placed.

## 2022-09-11 ENCOUNTER — Encounter (HOSPITAL_BASED_OUTPATIENT_CLINIC_OR_DEPARTMENT_OTHER): Payer: Self-pay

## 2022-09-13 ENCOUNTER — Ambulatory Visit (HOSPITAL_BASED_OUTPATIENT_CLINIC_OR_DEPARTMENT_OTHER): Payer: Medicare Other | Admitting: Pulmonary Disease

## 2022-09-13 ENCOUNTER — Encounter (HOSPITAL_BASED_OUTPATIENT_CLINIC_OR_DEPARTMENT_OTHER): Payer: Medicare Other

## 2022-10-01 ENCOUNTER — Telehealth: Payer: Self-pay | Admitting: Internal Medicine

## 2022-10-01 ENCOUNTER — Other Ambulatory Visit (INDEPENDENT_AMBULATORY_CARE_PROVIDER_SITE_OTHER): Payer: Medicare Other

## 2022-10-01 DIAGNOSIS — E039 Hypothyroidism, unspecified: Secondary | ICD-10-CM

## 2022-10-01 LAB — TSH: TSH: 3.65 u[IU]/mL (ref 0.35–5.50)

## 2022-10-01 NOTE — Telephone Encounter (Signed)
Patient dropped off document Handicap Placard, to be filled out by provider. Patient requested to send it via Call Patient to pick up within 5-days. Document is located in providers tray at front office.Please advise at  [phone]

## 2022-10-03 NOTE — Telephone Encounter (Signed)
Placed in Dr Hernandez's folder 

## 2022-10-04 NOTE — Telephone Encounter (Signed)
Form is ready to be picked up and patient is aware. 

## 2022-10-23 ENCOUNTER — Ambulatory Visit (INDEPENDENT_AMBULATORY_CARE_PROVIDER_SITE_OTHER): Payer: Medicare Other | Admitting: Internal Medicine

## 2022-10-23 ENCOUNTER — Encounter: Payer: Self-pay | Admitting: Internal Medicine

## 2022-10-23 VITALS — BP 120/80 | HR 65 | Temp 97.6°F | Wt 242.9 lb

## 2022-10-23 DIAGNOSIS — E039 Hypothyroidism, unspecified: Secondary | ICD-10-CM

## 2022-10-23 DIAGNOSIS — J069 Acute upper respiratory infection, unspecified: Secondary | ICD-10-CM

## 2022-10-23 LAB — TSH: TSH: 4.73 u[IU]/mL (ref 0.35–5.50)

## 2022-10-23 NOTE — Progress Notes (Signed)
Established Patient Office Visit     CC/Reason for Visit: URI symptoms with remaining cough  HPI: Erika [name] is a 67 y.o. female who is coming in today for the above mentioned reasons.  She experienced a URI about a month ago.  She has had a dry cough ever since.  No fever, no recent travel, no sick contacts.  She feels like the cough is worse at night.  Is improved with NyQuil.   Past Medical/Surgical History: Past Medical History:  Diagnosis Date   Anxiety    Arthritis    Bell's palsy    COPD (chronic obstructive pulmonary disease) (HCC)    History of cardiac murmur as a child    Hyperlipidemia    Hypertension    Menopausal syndrome    Mild asthma    no inhalers since stopped smoking   Myocardial infarction (HCC)    2020--pt states no damage   Neuroma of foot    OSA on CPAP    Wears glasses     Past Surgical History:  Procedure Laterality Date   CORONARY IMAGING/OCT N/A 07/14/2021   Procedure: INTRAVASCULAR IMAGING/OCT;  Surgeon: Orbie Pyo, MD;  Location: [facility];  Service: Cardiovascular;  Laterality: N/A;   CORONARY PRESSURE/FFR STUDY N/A 07/14/2021   Procedure: INTRAVASCULAR PRESSURE WIRE/FFR STUDY;  Surgeon: Orbie Pyo, MD;  Location: [facility];  Service: Cardiovascular;  Laterality: N/A;   CORONARY STENT INTERVENTION N/A 07/14/2021   Procedure: CORONARY STENT INTERVENTION;  Surgeon: Orbie Pyo, MD;  Location: [facility];  Service: Cardiovascular;  Laterality: N/A;   ELBOW SURGERY Right 2002   repair tendon and nerve injury   EXCISION MORTON'S NEUROMA Left 2010   LEFT HEART CATH AND CORONARY ANGIOGRAPHY N/A 07/14/2021   Procedure: LEFT HEART CATH AND CORONARY ANGIOGRAPHY;  Surgeon: Orbie Pyo, MD;  Location: [facility];  Service: Cardiovascular;  Laterality: N/A;   ROBOTIC ASSISTED TOTAL HYSTERECTOMY WITH BILATERAL SALPINGO OOPHERECTOMY N/A 12/31/2017   Procedure: XI ROBOTIC ASSISTED TOTAL  HYSTERECTOMY WITH BILATERAL SALPINGO OOPHORECTOMY;  Surgeon: Adolphus Birchwood, MD;  Location: [facility];  Service: Gynecology;  Laterality: N/A;   SHOULDER ARTHROSCOPY W/ ROTATOR CUFF REPAIR Left 09/2016   and burectomy    Social History:  reports that she quit smoking about 22 years ago. Her smoking use included cigarettes. She has a 30.00 pack-year smoking history. She has never used smokeless tobacco. She reports that she does not drink alcohol and does not use drugs.  Allergies: Allergies  Allergen Reactions   Tape Other (Erika Comments)    PATIENT PREFERS CLOTH TAPE- THE OTHERS DON'T AGREE WITH HER SKIN   Tetracycline Hcl Rash    Family History:  Family History  Problem Relation Age of Onset   Depression Mother    Diabetes Mother    Heart disease Mother    Asthma Sister    Cancer Maternal Aunt    Cancer Maternal Uncle    Uterine cancer Maternal Uncle    Breast cancer Paternal Aunt    Diabetes Maternal Grandmother    Diabetes Maternal Grandfather    Cancer Paternal Grandmother    Cancer Brother    Colon cancer Neg Hx    Colon polyps Neg Hx    Esophageal cancer Neg Hx    Rectal cancer Neg Hx    Stomach cancer Neg Hx      Current Outpatient Medications:    acetaminophen (TYLENOL) 650  MG CR tablet, Take 650-1,300 mg by mouth every 8 (eight) hours as needed for pain., Disp: , Rfl:    atorvastatin (LIPITOR) 40 MG tablet, Take 1 tablet (40 mg total) by mouth daily., Disp: 90 tablet, Rfl: 1   buPROPion (WELLBUTRIN XL) 150 MG 24 hr tablet, TAKE 1 TABLET(150 MG) BY MOUTH DAILY, Disp: 90 tablet, Rfl: 1   Cholecalciferol (VITAMIN D3 SUPER STRENGTH) 50 MCG (2000 UT) CAPS, Take 2,000 Units by mouth every evening., Disp: , Rfl:    citalopram (CELEXA) 40 MG tablet, Take 1 tablet (40 mg total) by mouth at bedtime., Disp: 90 tablet, Rfl: 1   clopidogrel (PLAVIX) 75 MG tablet, TAKE 1 TABLET(75 MG) BY MOUTH DAILY, Disp: 90 tablet, Rfl: 1   cyanocobalamin (VITAMIN B12) 1000 MCG/ML injection,  INJECT 1 ML (1,000 MCG TOTAL) INTO THE MUSCLE once a week for 1 month.  Then inject 1 ml once a month thereafter., Disp: 6 mL, Rfl: 11   diphenhydramine-acetaminophen (TYLENOL PM) 25-500 MG TABS tablet, Take 1 tablet by mouth at bedtime as needed (sleep)., Disp: , Rfl:    hydrochlorothiazide (HYDRODIURIL) 25 MG tablet, TAKE 1 TABLET(25 MG) BY MOUTH EVERY EVENING, Disp: 90 tablet, Rfl: 1   hydrOXYzine (VISTARIL) 25 MG capsule, Take 1 capsule (25 mg total) by mouth at bedtime and may repeat dose one time if needed., Disp: 60 capsule, Rfl: 0   levothyroxine (SYNTHROID) 75 MCG tablet, Take 1 tablet (75 mcg total) by mouth daily before breakfast., Disp: 90 tablet, Rfl: 1   metoprolol succinate (TOPROL-XL) 25 MG 24 hr tablet, TAKE 1/2 TABLET(12.5 MG) BY MOUTH DAILY, Disp: 45 tablet, Rfl: 1   nitroGLYCERIN (NITROSTAT) 0.4 MG SL tablet, Place 1 tablet (0.4 mg total) under the tongue every 5 (five) minutes as needed for chest pain. Up to 3 times., Disp: 90 tablet, Rfl: 3   SYRINGE-NEEDLE, DISP, 3 ML (BD SAFETYGLIDE SYRINGE/NEEDLE) 25G X 1" 3 ML MISC, Use for B12 injections, Disp: 100 each, Rfl: 11  Review of Systems:  Negative unless indicated in HPI.   Physical Exam: Vitals:   10/23/22 0726  BP: 120/80  Pulse: 65  Temp: 97.6 F (36.4 C)  TempSrc: Oral  SpO2: 99%  Weight: 242 lb 14.4 oz (110.2 kg)    Body mass index is 39.21 kg/m.   Physical Exam Vitals reviewed.  Constitutional:      Appearance: Normal appearance.  HENT:     Mouth/Throat:     Mouth: Mucous membranes are moist.     Pharynx: Oropharynx is clear.  Eyes:     Conjunctiva/sclera: Conjunctivae normal.     Pupils: Pupils are equal, round, and reactive to light.  Cardiovascular:     Rate and Rhythm: Normal rate and regular rhythm.  Pulmonary:     Effort: Pulmonary effort is normal.     Breath sounds: Normal breath sounds.  Neurological:     Mental Status: She is alert.      Impression and Plan:  Viral URI with  cough  Acquired hypothyroidism -     TSH  -Given exam findings, PNA, pharyngitis, ear infection are not likely, hence abx have not been prescribed. -Have advised rest, fluids, OTC antihistamines, cough suppressants and mucinex. -RTC if no improvement in 10-14 days.    Time spent:22 minutes reviewing chart, interviewing and examining patient and formulating plan of care.     Chaya Jan, MD [facility]

## 2022-11-01 ENCOUNTER — Ambulatory Visit: Payer: Medicare Other | Admitting: Internal Medicine

## 2022-11-27 ENCOUNTER — Encounter (HOSPITAL_BASED_OUTPATIENT_CLINIC_OR_DEPARTMENT_OTHER): Payer: Self-pay

## 2022-12-03 ENCOUNTER — Ambulatory Visit (INDEPENDENT_AMBULATORY_CARE_PROVIDER_SITE_OTHER): Payer: Medicare Other | Admitting: Pulmonary Disease

## 2022-12-03 ENCOUNTER — Encounter (HOSPITAL_BASED_OUTPATIENT_CLINIC_OR_DEPARTMENT_OTHER): Payer: Medicare Other

## 2022-12-03 ENCOUNTER — Ambulatory Visit (HOSPITAL_BASED_OUTPATIENT_CLINIC_OR_DEPARTMENT_OTHER): Payer: Medicare Other | Admitting: Pulmonary Disease

## 2022-12-03 DIAGNOSIS — R0609 Other forms of dyspnea: Secondary | ICD-10-CM

## 2022-12-03 LAB — PULMONARY FUNCTION TEST
DL/VA % pred: 111 %
DL/VA: 4.62 ml/min/mmHg/L
DLCO cor % pred: 108 %
DLCO cor: 22.06 ml/min/mmHg
DLCO unc % pred: 108 %
DLCO unc: 22.06 ml/min/mmHg
FEF 25-75 Post: 3.53 L/s
FEF 25-75 Pre: 3.04 L/s
FEF2575-%Change-Post: 16 %
FEF2575-%Pred-Post: 168 %
FEF2575-%Pred-Pre: 145 %
FEV1-%Change-Post: 2 %
FEV1-%Pred-Post: 102 %
FEV1-%Pred-Pre: 100 %
FEV1-Post: 2.52 L
FEV1-Pre: 2.45 L
FEV1FVC-%Change-Post: 3 %
FEV1FVC-%Pred-Pre: 110 %
FEV6-%Change-Post: 0 %
FEV6-%Pred-Post: 92 %
FEV6-%Pred-Pre: 93 %
FEV6-Post: 2.86 L
FEV6-Pre: 2.88 L
FEV6FVC-%Change-Post: 0 %
FEV6FVC-%Pred-Post: 104 %
FEV6FVC-%Pred-Pre: 103 %
FVC-%Change-Post: 0 %
FVC-%Pred-Post: 88 %
FVC-%Pred-Pre: 89 %
FVC-Post: 2.87 L
FVC-Pre: 2.89 L
Post FEV1/FVC ratio: 88 %
Post FEV6/FVC ratio: 100 %
Pre FEV1/FVC ratio: 85 %
Pre FEV6/FVC Ratio: 100 %
RV % pred: 104 %
RV: 2.29 L
TLC % pred: 106 %
TLC: 5.53 L

## 2022-12-03 NOTE — Patient Instructions (Signed)
Full PFT performed today. °

## 2022-12-03 NOTE — Progress Notes (Signed)
Full PFT performed today. °

## 2022-12-22 DIAGNOSIS — J069 Acute upper respiratory infection, unspecified: Secondary | ICD-10-CM | POA: Diagnosis not present

## 2022-12-22 DIAGNOSIS — H6503 Acute serous otitis media, bilateral: Secondary | ICD-10-CM | POA: Diagnosis not present

## 2022-12-22 DIAGNOSIS — R051 Acute cough: Secondary | ICD-10-CM | POA: Diagnosis not present

## 2022-12-28 ENCOUNTER — Emergency Department (HOSPITAL_COMMUNITY): Payer: Medicare Other

## 2022-12-28 ENCOUNTER — Encounter (HOSPITAL_COMMUNITY): Payer: Self-pay

## 2022-12-28 ENCOUNTER — Emergency Department (HOSPITAL_COMMUNITY)
Admission: EM | Admit: 2022-12-28 | Discharge: 2022-12-28 | Disposition: A | Discharge status: Home / Self Care | Payer: Medicare Other | Attending: Emergency Medicine | Admitting: Emergency Medicine

## 2022-12-28 ENCOUNTER — Other Ambulatory Visit: Payer: Self-pay

## 2022-12-28 DIAGNOSIS — I2510 Atherosclerotic heart disease of native coronary artery without angina pectoris: Secondary | ICD-10-CM | POA: Insufficient documentation

## 2022-12-28 DIAGNOSIS — R531 Weakness: Secondary | ICD-10-CM | POA: Diagnosis not present

## 2022-12-28 DIAGNOSIS — R059 Cough, unspecified: Secondary | ICD-10-CM | POA: Diagnosis not present

## 2022-12-28 DIAGNOSIS — J449 Chronic obstructive pulmonary disease, unspecified: Secondary | ICD-10-CM | POA: Insufficient documentation

## 2022-12-28 DIAGNOSIS — R0602 Shortness of breath: Secondary | ICD-10-CM | POA: Diagnosis not present

## 2022-12-28 DIAGNOSIS — Z7902 Long term (current) use of antithrombotics/antiplatelets: Secondary | ICD-10-CM | POA: Insufficient documentation

## 2022-12-28 DIAGNOSIS — R509 Fever, unspecified: Secondary | ICD-10-CM | POA: Diagnosis not present

## 2022-12-28 DIAGNOSIS — R0689 Other abnormalities of breathing: Secondary | ICD-10-CM | POA: Diagnosis not present

## 2022-12-28 DIAGNOSIS — I10 Essential (primary) hypertension: Secondary | ICD-10-CM | POA: Diagnosis not present

## 2022-12-28 DIAGNOSIS — R457 State of emotional shock and stress, unspecified: Secondary | ICD-10-CM | POA: Diagnosis not present

## 2022-12-28 DIAGNOSIS — Z1152 Encounter for screening for COVID-19: Secondary | ICD-10-CM | POA: Diagnosis not present

## 2022-12-28 DIAGNOSIS — I700 Atherosclerosis of aorta: Secondary | ICD-10-CM | POA: Diagnosis not present

## 2022-12-28 DIAGNOSIS — Z79899 Other long term (current) drug therapy: Secondary | ICD-10-CM | POA: Insufficient documentation

## 2022-12-28 LAB — COMPREHENSIVE METABOLIC PANEL
ALT: 18 U/L (ref 0–44)
AST: 16 U/L (ref 15–41)
Albumin: 3.3 g/dL — ABNORMAL LOW (ref 3.5–5.0)
Alkaline Phosphatase: 75 U/L (ref 38–126)
Anion gap: 10 (ref 5–15)
BUN: 24 mg/dL — ABNORMAL HIGH (ref 8–23)
CO2: 29 mmol/L (ref 22–32)
Calcium: 9.1 mg/dL (ref 8.9–10.3)
Chloride: 98 mmol/L (ref 98–111)
Creatinine, Ser: 0.92 mg/dL (ref 0.44–1.00)
GFR, Estimated: >60 mL/min (ref >60)
Glucose, Bld: 95 mg/dL (ref 70–99)
Potassium: 3.3 mmol/L — ABNORMAL LOW (ref 3.5–5.1)
Sodium: 137 mmol/L (ref 135–145)
Total Bilirubin: 0.5 mg/dL (ref 0.3–1.2)
Total Protein: 6.7 g/dL (ref 6.5–8.1)

## 2022-12-28 LAB — CBC
HCT: 43.6 % (ref 36.0–46.0)
Hemoglobin: 13.6 g/dL (ref 12.0–15.0)
MCH: 25.9 pg — ABNORMAL LOW (ref 26.0–34.0)
MCHC: 31.2 g/dL (ref 30.0–36.0)
MCV: 83 fL (ref 80.0–100.0)
Platelets: 415 10*3/uL — ABNORMAL HIGH (ref 150–400)
RBC: 5.25 MIL/uL — ABNORMAL HIGH (ref 3.87–5.11)
RDW: 15.6 % — ABNORMAL HIGH (ref 11.5–15.5)
WBC: 15.1 10*3/uL — ABNORMAL HIGH (ref 4.0–10.5)
nRBC: 0 % (ref 0.0–0.2)

## 2022-12-28 LAB — BRAIN NATRIURETIC PEPTIDE: B Natriuretic Peptide: 22.3 pg/mL (ref 0.0–100.0)

## 2022-12-28 LAB — TROPONIN I (HIGH SENSITIVITY)
Troponin I (High Sensitivity): 9 ng/L (ref <18)
Troponin I (High Sensitivity): 4 ng/L (ref <18)

## 2022-12-28 LAB — RESP PANEL BY RT-PCR (RSV, FLU A&B, COVID)  RVPGX2
Influenza A by PCR: NEGATIVE
Influenza B by PCR: NEGATIVE
Resp Syncytial Virus by PCR: NEGATIVE
SARS Coronavirus 2 by RT PCR: NEGATIVE

## 2022-12-28 MED ORDER — AZITHROMYCIN 250 MG PO TABS: ORAL_TABLET | ORAL | 0 refills | Status: DC

## 2022-12-28 MED ORDER — POTASSIUM CHLORIDE CRYS ER 20 MEQ PO TBCR
40.0000 meq | EXTENDED_RELEASE_TABLET | Freq: Once | ORAL | Status: AC
  Administered 2022-12-28: 40 meq via ORAL
  Filled 2022-12-28: qty 2

## 2022-12-28 MED ORDER — PREDNISONE 10 MG (21) PO TBPK
ORAL_TABLET | Freq: Every day | ORAL | 0 refills | Status: DC
Start: 2022-12-28 — End: 2023-03-20

## 2022-12-28 MED ORDER — IOHEXOL 350 MG/ML SOLN
75.0000 mL | Freq: Once | INTRAVENOUS | Status: AC | PRN
  Administered 2022-12-28: 75 mL via INTRAVENOUS

## 2022-12-28 MED ORDER — IPRATROPIUM-ALBUTEROL 0.5-2.5 (3) MG/3ML IN SOLN
3.0000 mL | RESPIRATORY_TRACT | Status: AC
  Administered 2022-12-28: 3 mL via RESPIRATORY_TRACT
  Filled 2022-12-28: qty 3

## 2022-12-28 MED ORDER — BENZONATATE 100 MG PO CAPS: 100.0000 mg | ORAL_CAPSULE | Freq: Three times a day (TID) | ORAL | 0 refills | Status: DC

## 2022-12-28 MED ORDER — DEXAMETHASONE SODIUM PHOSPHATE 10 MG/ML IJ SOLN
10.0000 mg | Freq: Once | INTRAMUSCULAR | Status: AC
  Administered 2022-12-28: 10 mg via INTRAVENOUS
  Filled 2022-12-28: qty 1

## 2022-12-28 NOTE — ED Notes (Signed)
Lab called and is running the first set of blood work with first set of labs and just sent a second trop as well they are running

## 2022-12-28 NOTE — ED Notes (Signed)
Called RT for peak flow test

## 2022-12-28 NOTE — ED Provider Notes (Signed)
Erika [name] EMERGENCY DEPARTMENT AT [facility] Provider Note   CSN: [id number] Arrival date & time: 12/28/22  1051     History  Chief Complaint  Patient presents with   Shortness of Breath    Erika [name] is a 67 y.o. female.  Patient with history of hypertension, hyperlipidemia, CAD, COPD, OSA on CPAP, MI presents today with complaints of shortness of breath. She states that 2 weeks ago she was sick with a URI. She was at the beach with her family when she had these symptoms, and the whole beach party was sick with similar symptoms. She states that she did drive home approximately 4 hours around 2 weeks ago. She has had persistent cough since then. Originally went to urgent care on Saturday (6 days ago) and was given a nebulizer treatment with improvement and sent home with 5 days of prednisone.  She took the prednisone as prescribed and states that on Tuesday she felt her symptoms were almost completely resolved.  However, since then she has had persistent worsening shortness of breath and a now worsening cough that is productive of yellow sputum as well.  She is unable to perform her daily activities due to significant shortness of breath.  Does state that she is not having to sleep sitting upright in a recliner was at the beginning of the week she was lying flat in bed with just a few pillows underneath her.  She has been wearing her CPAP at home.  She is not on any oxygen at home regularly.  She states that her chest feels sore from coughing so much but denies any specific chest pain.  Denies any leg pain or leg swelling.  Denies any abdominal distention.     The history is provided by the patient. No language interpreter was used.  Shortness of Breath Associated symptoms: cough        Home Medications Prior to Admission medications   Medication Sig Start Date End Date Taking? Authorizing Provider  acetaminophen (TYLENOL) 650 MG CR tablet Take 650-1,300 mg by mouth  every 8 (eight) hours as needed for pain.    [provider]  atorvastatin (LIPITOR) 40 MG tablet Take 1 tablet (40 mg total) by mouth daily. 08/02/22   Philip Aspen, Limmie Patricia, MD  buPROPion (WELLBUTRIN XL) 150 MG 24 hr tablet TAKE 1 TABLET(150 MG) BY MOUTH DAILY 08/02/22   Philip Aspen, Limmie Patricia, MD  Cholecalciferol (VITAMIN D3 SUPER STRENGTH) 50 MCG (2000 UT) CAPS Take 2,000 Units by mouth every evening.    [provider]  citalopram (CELEXA) 40 MG tablet Take 1 tablet (40 mg total) by mouth at bedtime. 08/02/22   Philip Aspen, Limmie Patricia, MD  clopidogrel (PLAVIX) 75 MG tablet TAKE 1 TABLET(75 MG) BY MOUTH DAILY 08/02/22   Philip Aspen, Limmie Patricia, MD  cyanocobalamin (VITAMIN B12) 1000 MCG/ML injection INJECT 1 ML (1,000 MCG TOTAL) INTO THE MUSCLE once a week for 1 month.  Then inject 1 ml once a month thereafter. 08/16/22   Philip Aspen, Limmie Patricia, MD  diphenhydramine-acetaminophen (TYLENOL PM) 25-500 MG TABS tablet Take 1 tablet by mouth at bedtime as needed (sleep).    [provider]  hydrochlorothiazide (HYDRODIURIL) 25 MG tablet TAKE 1 TABLET(25 MG) BY MOUTH EVERY EVENING 08/02/22   Philip Aspen, Limmie Patricia, MD  hydrOXYzine (VISTARIL) 25 MG capsule Take 1 capsule (25 mg total) by mouth at bedtime and may repeat dose one time if needed. 08/02/22   Ardyth Harps  Priscella Mann, MD  levothyroxine (SYNTHROID) 75 MCG tablet Take 1 tablet (75 mcg total) by mouth daily before breakfast. 08/02/22   Philip Aspen, Limmie Patricia, MD  metoprolol succinate (TOPROL-XL) 25 MG 24 hr tablet TAKE 1/2 TABLET(12.5 MG) BY MOUTH DAILY 08/02/22   Philip Aspen, Limmie Patricia, MD  nitroGLYCERIN (NITROSTAT) 0.4 MG SL tablet Place 1 tablet (0.4 mg total) under the tongue every 5 (five) minutes as needed for chest pain. Up to 3 times. 08/02/22 10/31/22  Philip Aspen, Limmie Patricia, MD  SYRINGE-NEEDLE, DISP, 3 ML (BD SAFETYGLIDE SYRINGE/NEEDLE) 25G X 1" 3 ML MISC Use for B12 injections 05/06/20    Philip Aspen, Limmie Patricia, MD      Allergies    Tape and Tetracycline hcl    Review of Systems   Review of Systems  Respiratory:  Positive for cough and shortness of breath.   All other systems reviewed and are negative.   Physical Exam Updated Vital Signs BP 133/80   Pulse 66   Temp (!) 97.3 F (36.3 C) (Oral)   Resp 20   Ht 5\' 6"  (1.676 m)   Wt 110.2 kg   SpO2 98%   BMI 39.21 kg/m  Physical Exam Vitals and nursing note reviewed.  Constitutional:      General: She is not in acute distress.    Appearance: Normal appearance. She is normal weight. She is not ill-appearing, toxic-appearing or diaphoretic.  HENT:     Head: Normocephalic and atraumatic.  Cardiovascular:     Rate and Rhythm: Normal rate and regular rhythm.     Heart sounds: Normal heart sounds.  Pulmonary:     Effort: Tachypnea present. No respiratory distress.     Breath sounds: Normal breath sounds.  Abdominal:     Palpations: Abdomen is soft.     Tenderness: There is no abdominal tenderness.  Musculoskeletal:        General: Normal range of motion.     Cervical back: Normal range of motion.     Right lower leg: No tenderness. No edema.     Left lower leg: No tenderness. No edema.  Skin:    General: Skin is warm and dry.  Neurological:     General: No focal deficit present.     Mental Status: She is alert.  Psychiatric:        Mood and Affect: Mood normal.        Behavior: Behavior normal.     ED Results / Procedures / Treatments   Labs (all labs ordered are listed, but only abnormal results are displayed) Labs Reviewed  COMPREHENSIVE METABOLIC PANEL - Abnormal; Notable for the following components:      Result Value   Potassium 3.3 (*)    BUN 24 (*)    Albumin 3.3 (*)    All other components within normal limits  CBC - Abnormal; Notable for the following components:   WBC 15.1 (*)    RBC 5.25 (*)    MCH 25.9 (*)    RDW 15.6 (*)    Platelets 415 (*)    All other components within  normal limits  RESP PANEL BY RT-PCR (RSV, FLU A&B, COVID)  [id number]  BRAIN NATRIURETIC PEPTIDE  TROPONIN I (HIGH SENSITIVITY)    EKG EKG Interpretation Date/Time:  Friday December 28 2022 10:54:52 EDT Ventricular Rate:  64 PR Interval:  156 QRS Duration:  86 QT Interval:  410 QTC Calculation: 422 R Axis:   57  Text Interpretation:  Normal sinus rhythm Normal ECG When compared with ECG of 14-Jul-2021 10:02, PREVIOUS ECG IS PRESENT Confirmed by Coralee Pesa [phone]) on 12/28/2022 1:58:53 PM  Radiology CT Angio Chest PE W and/or Wo Contrast  Result Date: 12/28/2022 CLINICAL DATA:  Fever for 2 days and shortness of breath EXAM: CT ANGIOGRAPHY CHEST WITH CONTRAST TECHNIQUE: Multidetector CT imaging of the chest was performed using the standard protocol during bolus administration of intravenous contrast. Multiplanar CT image reconstructions and MIPs were obtained to evaluate the vascular anatomy. RADIATION DOSE REDUCTION: This exam was performed according to the departmental dose-optimization program which includes automated exposure control, adjustment of the mA and/or kV according to patient size and/or use of iterative reconstruction technique. CONTRAST:  75mL OMNIPAQUE IOHEXOL 350 MG/ML SOLN COMPARISON:  Radiograph 12/28/2022 and CT chest 06/10/2022 FINDINGS: Cardiovascular: Negative for acute pulmonary embolism. No pericardial effusion. Coronary artery and aortic atherosclerotic calcification. Mediastinum/Nodes: Small hiatal hernia. Unremarkable trachea. No thoracic adenopathy. Lungs/Pleura: Mild mosaic attenuation in the upper lungs compatible with air trapping. No focal consolidation, pleural effusion or pneumothorax. Upper Abdomen: No acute abnormality. Musculoskeletal: No acute fracture. Review of the MIP images confirms the above findings. IMPRESSION: 1. Negative for acute pulmonary embolism. 2. Small hiatal hernia. Aortic Atherosclerosis (ICD10-I70.0). Electronically Signed   By: Minerva Fester M.D.   On: 12/28/2022 19:25   DG Chest 1 View  Result Date: 12/28/2022 CLINICAL DATA:  Shortness of breath, fever, cough. EXAM: CHEST  1 VIEW COMPARISON:  February 17, 2020. FINDINGS: The heart size and mediastinal contours are within normal limits. Both lungs are clear. The visualized skeletal structures are unremarkable. IMPRESSION: No active disease. Electronically Signed   By: Lupita Raider M.D.   On: 12/28/2022 14:34    Procedures Procedures    Medications Ordered in ED Medications  ipratropium-albuterol (DUONEB) 0.5-2.5 (3) MG/3ML nebulizer solution 3 mL ( Nebulization Canceled Entry 12/28/22 1726)  dexamethasone (DECADRON) injection 10 mg (has no administration in time range)  potassium chloride SA (KLOR-CON M) CR tablet 40 mEq (40 mEq Oral Given 12/28/22 1938)  iohexol (OMNIPAQUE) 350 MG/ML injection 75 mL (75 mLs Intravenous Contrast Given 12/28/22 1749)    ED Course/ Medical Decision Making/ A&P                                 Medical Decision Making Amount and/or Complexity of Data Reviewed Labs: ordered. Radiology: ordered.  Risk Prescription drug management.   This patient is a 67 y.o. female who presents to the ED for concern of shortness of breath, this involves an extensive number of treatment options, and is a complaint that carries with it a high risk of complications and morbidity. The emergent differential diagnosis prior to evaluation includes, but is not limited to,  CHF, pericardial effusion/tamponade, arrhythmias, ACS, COPD, asthma, bronchitis, pneumonia, pneumothorax, PE, anemia   This is not an exhaustive differential.   Past Medical History / Co-morbidities / Social History:  has a past medical history of Anxiety, Arthritis, Bell's palsy, COPD (chronic obstructive pulmonary disease) (HCC), History of cardiac murmur as a child, Hyperlipidemia, Hypertension, Menopausal syndrome, Mild asthma, Myocardial infarction (HCC), Neuroma of foot, OSA on CPAP,  and Wears glasses.  Additional history: Chart reviewed. Pertinent results include: Seen at urgent care on 9/07, diagnosed with postviral cough and given neb treatment and with prednisone  Physical Exam: Physical exam performed. The pertinent findings include: patient able to speak in complete sentences  but is slightly tachypneic and appears uncomfortable.  Lab Tests: I ordered, and personally interpreted labs.  The pertinent results include:  WBC 15.1, however patient recently completed prednisone taper. K 3.3.  Delta troponin negative.  BNP WNL.   Imaging Studies: I ordered imaging studies including CXR, CTA PE. I independently visualized and interpreted imaging which showed   CXR: NAD  CTPE: 1. Negative for acute pulmonary embolism.  2. Small hiatal hernia.  I agree with the radiologist interpretation.   Cardiac Monitoring:  The patient was maintained on a cardiac monitor.  My attending physician Dr. Wilkie Aye viewed and interpreted the cardiac monitored which showed an underlying rhythm of: sinus rhythm. I agree with this interpretation.   Medications: I ordered medication including duoneb, decadron, oral potassium  for shortness of breath, hypokalemia. Reevaluation of the patient after these medicines showed that the patient improved. I have reviewed the patients home medicines and have made adjustments as needed.  Disposition: After consideration of the diagnostic results and the patients response to treatment, I feel that emergency department workup does not suggest an emergent condition requiring admission or immediate intervention beyond what has been performed at this time. The plan is: discharge with sterapred and z-pack.  Patient does note that she got better when she was on 5 days of prednisone but her symptoms worsened again when she ran out of the prednisone.  Given duration of symptoms, will also send for Z-Pak to cover bacterial cause of symptoms.  I did offer admission for  persistent shortness of breath, however patient was able to walk around without any drop in her oxygen saturation and states she feels well enough to go home would prefer to be discharged.  Given that her workup is benign, no signs of ACS, PE, CHF exacerbation, feel that discharge home with close outpatient follow-up and return precautions is reasonable.  She is not wheezing.  Suspect symptoms are due to postviral bronchitis following her URI.  Will also send for tessalon for cough  I discussed this case with my attending physician Dr. Wilkie Aye who cosigned this note including patient's presenting symptoms, physical exam, and planned diagnostics and interventions. Attending physician stated agreement with plan or made changes to plan which were implemented.    Final Clinical Impression(s) / ED Diagnoses Final diagnoses:  Shortness of breath    Rx / DC Orders ED Discharge Orders          Ordered    predniSONE (STERAPRED UNI-PAK 21 TAB) 10 MG (21) TBPK tablet  Daily        12/28/22 2149    azithromycin (ZITHROMAX Z-PAK) 250 MG tablet        12/28/22 2149    benzonatate (TESSALON) 100 MG capsule  Every 8 hours        12/28/22 2152          An After Visit Summary was printed and given to the patient.     Vear Clock 12/28/22 2155    Rozelle Logan, DO 01/12/23 1537

## 2022-12-28 NOTE — Discharge Instructions (Signed)
As we discussed, your workup in the ER today was reassuring for acute findings.  Laboratory evaluation, x-ray, and CT imaging did not reveal any emergent etiology of your symptoms.  I do suspect that you have some bronchitis or inflammation in your airways leftover from the viral infection you had 2 weeks ago.  Given that you got some relief with the prednisone you were given last Saturday, I have given you a prescription for a longer taper of steroids.  I have also given you a prescription for azithromycin to cover for any bacterial cause of symptoms.  Please fill and take this as prescribed in its entirety.  Additionally, I have given you a prescription for Tessalon to help with your cough.  Please take this as prescribed as needed.  Please call your doctor to schedule a close follow-up appointment.  Return if development of any new or worsening symptoms.

## 2022-12-28 NOTE — ED Notes (Signed)
Patient transported to CT 

## 2022-12-28 NOTE — ED Triage Notes (Signed)
Pt c/o [id number]. Pt states she ran a fever for two days and that's when the SOB started. Pt states went to UC last week and was given meds and states felt better Tuesda, but states got worse on Wednesday and been getting worse since. Pt voice sounds hoarse. Pt has some labored breathing. Pt c/o productive cough w/yellow sputum.

## 2023-01-01 ENCOUNTER — Ambulatory Visit (INDEPENDENT_AMBULATORY_CARE_PROVIDER_SITE_OTHER): Payer: Medicare Other | Admitting: Internal Medicine

## 2023-01-01 VITALS — BP 120/80 | HR 70 | Temp 97.8°F | Wt 244.4 lb

## 2023-01-01 DIAGNOSIS — J209 Acute bronchitis, unspecified: Secondary | ICD-10-CM | POA: Diagnosis not present

## 2023-01-01 DIAGNOSIS — R0609 Other forms of dyspnea: Secondary | ICD-10-CM | POA: Diagnosis not present

## 2023-01-01 DIAGNOSIS — R0602 Shortness of breath: Secondary | ICD-10-CM | POA: Diagnosis not present

## 2023-01-01 DIAGNOSIS — J440 Chronic obstructive pulmonary disease with acute lower respiratory infection: Secondary | ICD-10-CM | POA: Diagnosis not present

## 2023-01-01 DIAGNOSIS — G4733 Obstructive sleep apnea (adult) (pediatric): Secondary | ICD-10-CM

## 2023-01-01 MED ORDER — ALBUTEROL SULFATE HFA 108 (90 BASE) MCG/ACT IN AERS
2.0000 | INHALATION_SPRAY | Freq: Four times a day (QID) | RESPIRATORY_TRACT | 2 refills | Status: DC | PRN
Start: 2023-01-01 — End: 2023-05-27

## 2023-01-01 MED ORDER — BENZONATATE 100 MG PO CAPS
100.0000 mg | ORAL_CAPSULE | Freq: Three times a day (TID) | ORAL | 0 refills | Status: DC
Start: 2023-01-01 — End: 2023-03-20

## 2023-01-01 MED ORDER — ALBUTEROL SULFATE (2.5 MG/3ML) 0.083% IN NEBU
2.5000 mg | INHALATION_SOLUTION | RESPIRATORY_TRACT | Status: AC
Start: 2023-01-01 — End: 2023-01-01
  Administered 2023-01-01: 2.5 mg via RESPIRATORY_TRACT

## 2023-01-01 MED ORDER — HYDROXYZINE PAMOATE 25 MG PO CAPS
25.0000 mg | ORAL_CAPSULE | Freq: Every evening | ORAL | 0 refills | Status: DC | PRN
Start: 2023-01-01 — End: 2023-01-28

## 2023-01-01 NOTE — Progress Notes (Signed)
Established Patient Office Visit     CC/Reason for Visit: Shortness of breath  HPI: Erika [name] is a 67 y.o. female who is coming in today for the above mentioned reasons.  3 weeks ago, while at the beach, she developed URI symptoms.  When she returned she went to urgent care and was diagnosed with bronchitis and given 5 days of prednisone.  Her symptoms initially improved but then worsened prompting her to go to the emergency department on September 13.  During that visit she had completely normal workup including chest x-ray, CT angio of the chest without findings, normal EKG, normal troponins.  She was discharged home after breathing treatment.  She was again given 6 days of prednisone and this time was given Tessalon pearls for cough and a Z-Pak.  She feels like she is not improving, she feels weak and fatigued.  She feels short of breath especially with exertion.  She has had a normal cardiac workup fairly recently.  Past Medical/Surgical History: Past Medical History:  Diagnosis Date   Anxiety    Arthritis    Bell's palsy    COPD (chronic obstructive pulmonary disease) (HCC)    History of cardiac murmur as a child    Hyperlipidemia    Hypertension    Menopausal syndrome    Mild asthma    no inhalers since stopped smoking   Myocardial infarction (HCC)    2020--pt states no damage   Neuroma of foot    OSA on CPAP    Wears glasses     Past Surgical History:  Procedure Laterality Date   CORONARY IMAGING/OCT N/A 07/14/2021   Procedure: INTRAVASCULAR IMAGING/OCT;  Surgeon: Orbie Pyo, MD;  Location: [facility];  Service: Cardiovascular;  Laterality: N/A;   CORONARY PRESSURE/FFR STUDY N/A 07/14/2021   Procedure: INTRAVASCULAR PRESSURE WIRE/FFR STUDY;  Surgeon: Orbie Pyo, MD;  Location: [facility];  Service: Cardiovascular;  Laterality: N/A;   CORONARY STENT INTERVENTION N/A 07/14/2021   Procedure: CORONARY STENT INTERVENTION;  Surgeon: Orbie Pyo, MD;  Location: [facility];  Service: Cardiovascular;  Laterality: N/A;   ELBOW SURGERY Right 2002   repair tendon and nerve injury   EXCISION MORTON'S NEUROMA Left 2010   LEFT HEART CATH AND CORONARY ANGIOGRAPHY N/A 07/14/2021   Procedure: LEFT HEART CATH AND CORONARY ANGIOGRAPHY;  Surgeon: Orbie Pyo, MD;  Location: [facility];  Service: Cardiovascular;  Laterality: N/A;   ROBOTIC ASSISTED TOTAL HYSTERECTOMY WITH BILATERAL SALPINGO OOPHERECTOMY N/A 12/31/2017   Procedure: XI ROBOTIC ASSISTED TOTAL HYSTERECTOMY WITH BILATERAL SALPINGO OOPHORECTOMY;  Surgeon: Adolphus Birchwood, MD;  Location: [facility];  Service: Gynecology;  Laterality: N/A;   SHOULDER ARTHROSCOPY W/ ROTATOR CUFF REPAIR Left 09/2016   and burectomy    Social History:  reports that she quit smoking about 22 years ago. Her smoking use included cigarettes. She started smoking about 52 years ago. She has a 30 pack-year smoking history. She has never used smokeless tobacco. She reports that she does not drink alcohol and does not use drugs.  Allergies: Allergies  Allergen Reactions   Tape Other (See Comments)    PATIENT PREFERS CLOTH TAPE- THE OTHERS DON'T AGREE WITH HER SKIN   Tetracycline Hcl Rash    Family History:  Family History  Problem Relation Age of Onset   Depression Mother    Diabetes Mother    Heart disease Mother    Asthma Sister  Cancer Maternal Aunt    Cancer Maternal Uncle    Uterine cancer Maternal Uncle    Breast cancer Paternal Aunt    Diabetes Maternal Grandmother    Diabetes Maternal Grandfather    Cancer Paternal Grandmother    Cancer Brother    Colon cancer Neg Hx    Colon polyps Neg Hx    Esophageal cancer Neg Hx    Rectal cancer Neg Hx    Stomach cancer Neg Hx      Current Outpatient Medications:    acetaminophen (TYLENOL) 650 MG CR tablet, Take 650-1,300 mg by mouth every 8 (eight) hours as needed for pain., Disp: , Rfl:    albuterol (VENTOLIN HFA) 108 (90  Base) MCG/ACT inhaler, Inhale 2 puffs into the lungs every 6 (six) hours as needed for wheezing or shortness of breath., Disp: 8 g, Rfl: 2   atorvastatin (LIPITOR) 40 MG tablet, Take 1 tablet (40 mg total) by mouth daily., Disp: 90 tablet, Rfl: 1   azithromycin (ZITHROMAX Z-PAK) 250 MG tablet, Take 2 tablets on day 1, followed by 1 dose on day 2, day 3, day 4, and day 5, Disp: 6 each, Rfl: 0   buPROPion (WELLBUTRIN XL) 150 MG 24 hr tablet, TAKE 1 TABLET(150 MG) BY MOUTH DAILY, Disp: 90 tablet, Rfl: 1   Cholecalciferol (VITAMIN D3 SUPER STRENGTH) 50 MCG (2000 UT) CAPS, Take 2,000 Units by mouth every evening., Disp: , Rfl:    citalopram (CELEXA) 40 MG tablet, Take 1 tablet (40 mg total) by mouth at bedtime., Disp: 90 tablet, Rfl: 1   clopidogrel (PLAVIX) 75 MG tablet, TAKE 1 TABLET(75 MG) BY MOUTH DAILY, Disp: 90 tablet, Rfl: 1   cyanocobalamin (VITAMIN B12) 1000 MCG/ML injection, INJECT 1 ML (1,000 MCG TOTAL) INTO THE MUSCLE once a week for 1 month.  Then inject 1 ml once a month thereafter., Disp: 6 mL, Rfl: 11   diphenhydramine-acetaminophen (TYLENOL PM) 25-500 MG TABS tablet, Take 1 tablet by mouth at bedtime as needed (sleep)., Disp: , Rfl:    hydrochlorothiazide (HYDRODIURIL) 25 MG tablet, TAKE 1 TABLET(25 MG) BY MOUTH EVERY EVENING, Disp: 90 tablet, Rfl: 1   levothyroxine (SYNTHROID) 75 MCG tablet, Take 1 tablet (75 mcg total) by mouth daily before breakfast., Disp: 90 tablet, Rfl: 1   metoprolol succinate (TOPROL-XL) 25 MG 24 hr tablet, TAKE 1/2 TABLET(12.5 MG) BY MOUTH DAILY, Disp: 45 tablet, Rfl: 1   nitroGLYCERIN (NITROSTAT) 0.4 MG SL tablet, Place 1 tablet (0.4 mg total) under the tongue every 5 (five) minutes as needed for chest pain. Up to 3 times., Disp: 90 tablet, Rfl: 3   predniSONE (STERAPRED UNI-PAK 21 TAB) 10 MG (21) TBPK tablet, Take by mouth daily. Take 6 tabs by mouth daily  for 2 days, then 5 tabs for 2 days, then 4 tabs for 2 days, then 3 tabs for 2 days, 2 tabs for 2 days, then  1 tab by mouth daily for 2 days, Disp: 42 tablet, Rfl: 0   SYRINGE-NEEDLE, DISP, 3 ML (BD SAFETYGLIDE SYRINGE/NEEDLE) 25G X 1" 3 ML MISC, Use for B12 injections, Disp: 100 each, Rfl: 11   benzonatate (TESSALON) 100 MG capsule, Take 1 capsule (100 mg total) by mouth every 8 (eight) hours., Disp: 21 capsule, Rfl: 0   hydrOXYzine (VISTARIL) 25 MG capsule, Take 1 capsule (25 mg total) by mouth at bedtime and may repeat dose one time if needed., Disp: 60 capsule, Rfl: 0  Review of Systems:  Negative unless indicated in  HPI.   Physical Exam: Vitals:   01/01/23 1319  BP: 120/80  Pulse: 70  Temp: 97.8 F (36.6 C)  TempSrc: Oral  SpO2: 98%  Weight: 244 lb 6.4 oz (110.9 kg)    Body mass index is 39.45 kg/m.   Physical Exam Vitals reviewed.  Constitutional:      Appearance: Normal appearance.  HENT:     Head: Normocephalic and atraumatic.  Eyes:     Conjunctiva/sclera: Conjunctivae normal.     Pupils: Pupils are equal, round, and reactive to light.  Cardiovascular:     Rate and Rhythm: Normal rate and regular rhythm.  Pulmonary:     Effort: Pulmonary effort is normal.     Breath sounds: Normal breath sounds.  Skin:    General: Skin is warm and dry.  Neurological:     General: No focal deficit present.     Mental Status: She is alert and oriented to person, place, and time.  Psychiatric:        Mood and Affect: Mood normal.        Behavior: Behavior normal.        Thought Content: Thought content normal.        Judgment: Judgment normal.      Impression and Plan:  Shortness of breath -     Albuterol Sulfate  DOE (dyspnea on exertion)  OSA on CPAP  Acute bronchitis with COPD (HCC) -     Albuterol Sulfate HFA; Inhale 2 puffs into the lungs every 6 (six) hours as needed for wheezing or shortness of breath.  Dispense: 8 g; Refill: 2 -     Benzonatate; Take 1 capsule (100 mg total) by mouth every 8 (eight) hours.  Dispense: 21 capsule; Refill: 0 -     hydrOXYzine  Pamoate; Take 1 capsule (25 mg total) by mouth at bedtime and may repeat dose one time if needed.  Dispense: 60 capsule; Refill: 0   -Exam today is reassuring with normal lung auscultation, normal oxygenation and recent comprehensive workup done in the emergency department. -Advised to finish out course of azithromycin and prednisone as prescribed.  Will give a breathing treatment in the office as well as sent home with an albuterol inhaler. -She has follow-up scheduled next week with her pulmonologist.  Time spent:33 minutes reviewing chart, interviewing and examining patient and formulating plan of care.     Chaya Jan, MD [facility]

## 2023-01-08 ENCOUNTER — Encounter: Payer: Self-pay | Admitting: Nurse Practitioner

## 2023-01-08 ENCOUNTER — Ambulatory Visit: Payer: Medicare Other | Admitting: Nurse Practitioner

## 2023-01-08 VITALS — BP 136/74 | HR 72 | Ht 66.0 in | Wt 248.6 lb

## 2023-01-08 DIAGNOSIS — R0609 Other forms of dyspnea: Secondary | ICD-10-CM

## 2023-01-08 DIAGNOSIS — I10 Essential (primary) hypertension: Secondary | ICD-10-CM | POA: Diagnosis not present

## 2023-01-08 DIAGNOSIS — G4733 Obstructive sleep apnea (adult) (pediatric): Secondary | ICD-10-CM | POA: Diagnosis not present

## 2023-01-08 DIAGNOSIS — Z6841 Body Mass Index (BMI) 40.0 and over, adult: Secondary | ICD-10-CM

## 2023-01-08 DIAGNOSIS — E66813 Obesity, class 3: Secondary | ICD-10-CM

## 2023-01-08 LAB — NITRIC OXIDE: Nitric Oxide: 14

## 2023-01-08 MED ORDER — BUDESONIDE-FORMOTEROL FUMARATE 80-4.5 MCG/ACT IN AERO: 2.0000 | INHALATION_SPRAY | Freq: Two times a day (BID) | RESPIRATORY_TRACT | 12 refills | Status: DC

## 2023-01-08 NOTE — Assessment & Plan Note (Signed)
Constellation of symptoms, evidence of air trapping and positive response to bronchodilator and steroids, consistent with small airways disease and raises suspicion for underlying asthma diagnosis. No formal obstruction on PFTs. Will trial her on ICS/LABA therapy. Medication education provided and side effect profile reviewed. Teachback performed. Provided with SABA for rescue. Understands the difference in maintenance vs rescue inhalers. Provided with action plan.  Component of DOE likely related to deconditioning and obesity. Healthy weight loss encouraged. She is interested in the Provider Referral Exercise Program. Referral placed today.   Patient Instructions  Continue Albuterol inhaler 2 puffs every 6 hours as needed for shortness of breath or wheezing. Notify if symptoms persist despite rescue inhaler/neb use.  Complete prednisone   Trial Symbicort 2 puffs Twice daily. Brush tongue and rinse mouth afterwards. If this is not covered by insurance, let me know and we can find an alternative   Referral to P.R.E.P exercise program. Someone should contact you regarding this. If you haven't heard something in the next 2-3 weeks, please let us know   Follow up in 6 weeks with Dr. Vassie Loll or Philis Nettle. If symptoms do not improve or worsen, please contact office for sooner follow up or seek emergency care.

## 2023-01-08 NOTE — Patient Instructions (Addendum)
Continue Albuterol inhaler 2 puffs every 6 hours as needed for shortness of breath or wheezing. Notify if symptoms persist despite rescue inhaler/neb use.  Complete prednisone   Trial Symbicort 2 puffs Twice daily. Brush tongue and rinse mouth afterwards. If this is not covered by insurance, let me know and we can find an alternative   Referral to P.R.E.P exercise program. Someone should contact you regarding this. If you haven't heard something in the next 2-3 weeks, please let us know   Follow up in 6 weeks with Dr. Vassie Loll or Philis Nettle. If symptoms do not improve or worsen, please contact office for sooner follow up or seek emergency care.

## 2023-01-08 NOTE — Progress Notes (Unsigned)
@Patient  ID: Erika [name], female    DOB: [birth year], 67 y.o.   MRN: [id number]  Chief Complaint  Patient presents with   Follow-up    Pt is here to discuss PFT results. Pt complains of SOB.    Referring provider: Philip Aspen, [place]*  HPI: 67 year old female, former smoker followed for dyspnea on exertion and OSA on CPAP. She is a patient of Dr. Reginia Naas and last seen in office 05/31/2022. Past medical history significant for HTN, CHF, CAD, angina, vertigo, anxiety, depression, HLD.  TEST/EVENTS:  03/02/2021 echo: EF 60 to 65%.  LV diastolic parameters normal.  RV size and function normal.  Trivial MR. 07/14/2021 cardiac catheterization: EF 55 to 65%; LVEDP 22 mmHg 12/03/2022 PFT: FVC 89, FEV1 100, ratio 88, TLC 106, DLCOcor 108 12/28/2022 CTA chest: Negative for PE.  CAD/atherosclerosis.  Small hiatal hernia.  Mild mosaic attenuation in the upper lungs compatible with air trapping.  05/31/2022: OV with Dr. Vassie Loll. DOE and OSA. Hx of OSA dating back to 2004, AHI 22/h. Maintained on CPAP with good improvement in her daytime somnolence and fatigue. Lately if she feels like her breathing is worse at night and she cannot get enough air. Creates anxiety. Wishes she could do awake with her CPAP. Reports DOE for 1 year and gradually worsening. Worse on walking, climbing stairs or cleaning her house. Reports associated dry cough. Underwent cardiac evaluation including normal echo cardiac cath showed LVEDP of 22 but non significant CAD. CT chest unremarkable for 4 mm RUL. 1 year CT for follow up. No desaturations on walk test. Etiology for dyspnea not apparent. Cardiac workup was negative except for elevated LVEDP on cardiac cath. She was given lasix without significant improvement. Does not appear fluid overloaded. There is no evidence of ILD or PH. PFTs ordered for further evaluation. DOE possibly due to deconditioning and weight gain.   01/08/2023: Today - follow up Patient presents today for follow  up after undergoing pulmonary function testing, which were normal aside from some air trapping. She has been having trouble with a bout of bronchitis over the past month. She went to Urgent Care 9/7 and was treated with steroids. She felt better with this but symptoms quickly returned once off steroids. She felt like her breathing worsened so she went to the ED on 9/13. Workup was unremarkable. She had normal lab testing and CT imaging without evidence of acute infectious process or PE. She was treated with z pack and prednisone taper. She then went to her PCP on 9/17 and was still experiencing increased dyspnea. Note reviewed; exam reassuring with clear lung sounds. She was advised to finish z pack and prednisone. She was also given a breathing treatment in office and sent home with albuterol and atarax.   Today, she tells me she is finally feeling better. Symptoms started to improve middle to end of last week. She's on her last day of prednisone. She has used the albuterol a couple times this week. She does feel like the steroids and inhaler help her a lot. Her cough is mostly gone. Breathing feels better than her usual with being on the steroids. It's just keeping her up at night. She usually gets short winded with activity. Has trouble doing things around the house, which has been normal for her for quite some time. Feels like she gasps for air at times with activity and sometimes at night when she gets ready to lay down and go to sleep. She has  never been on a daily inhaler. No history of childhood asthma. She denies any current problems with wheezing, fevers, chills, leg swelling, CP. She does want to start working on losing weight and some sort of exercise program. Thinks this is a contributing factor as well. Has had normal cardiac workup thus far.   12/09/2022-01/07/2023: CPAP 8-15 cmH2O 28/30 days; >4 hr 93%, average use 7 hr 43 min Pressure 95th 12 Leaks 95th 27.4 AHI 1.4  Allergies  Allergen  Reactions   Tape Other (See Comments)    PATIENT PREFERS CLOTH TAPE- THE OTHERS DON'T AGREE WITH HER SKIN   Tetracycline Hcl Rash    Immunization History  Administered Date(s) Administered   Fluad Quad(high Dose 65+) 03/26/2020, 01/17/2022   Influenza Split 05/15/2011, 01/25/2014   Influenza Whole 01/06/2008, 02/03/2009   Influenza,inj,Quad PF,6+ Mos 02/05/2013, 01/29/2017   Influenza-Unspecified 01/14/2017, 01/27/2019   PFIZER(Purple Top)SARS-COV-2 Vaccination 07/17/2019, 08/11/2019, 03/04/2020   PNEUMOCOCCAL CONJUGATE-20 01/17/2022   Pneumococcal Conjugate-13 03/26/2020   Td 04/16/2004   Tdap 06/14/2014   Zoster Recombinant(Shingrix) 01/17/2021    Past Medical History:  Diagnosis Date   Anxiety    Arthritis    Bell's palsy    COPD (chronic obstructive pulmonary disease) (HCC)    History of cardiac murmur as a child    Hyperlipidemia    Hypertension    Menopausal syndrome    Mild asthma    no inhalers since stopped smoking   Myocardial infarction (HCC)    2020--pt states no damage   Neuroma of foot    OSA on CPAP    Wears glasses     Tobacco History: Social History   Tobacco Use  Smoking Status Former   Current packs/day: 0.00   Average packs/day: 1 pack/day for 30.0 years (30.0 ttl pk-yrs)   Types: Cigarettes   Start date: 06/01/1970   Quit date: 06/01/2000   Years since quitting: 22.6  Smokeless Tobacco Never   Counseling given: Not Answered   Outpatient Medications Prior to Visit  Medication Sig Dispense Refill   acetaminophen (TYLENOL) 650 MG CR tablet Take 650-1,300 mg by mouth every 8 (eight) hours as needed for pain.     albuterol (VENTOLIN HFA) 108 (90 Base) MCG/ACT inhaler Inhale 2 puffs into the lungs every 6 (six) hours as needed for wheezing or shortness of breath. 8 g 2   atorvastatin (LIPITOR) 40 MG tablet Take 1 tablet (40 mg total) by mouth daily. 90 tablet 1   azithromycin (ZITHROMAX Z-PAK) 250 MG tablet Take 2 tablets on day 1, followed by  1 dose on day 2, day 3, day 4, and day 5 6 each 0   benzonatate (TESSALON) 100 MG capsule Take 1 capsule (100 mg total) by mouth every 8 (eight) hours. 21 capsule 0   buPROPion (WELLBUTRIN XL) 150 MG 24 hr tablet TAKE 1 TABLET(150 MG) BY MOUTH DAILY 90 tablet 1   Cholecalciferol (VITAMIN D3 SUPER STRENGTH) 50 MCG (2000 UT) CAPS Take 2,000 Units by mouth every evening.     citalopram (CELEXA) 40 MG tablet Take 1 tablet (40 mg total) by mouth at bedtime. 90 tablet 1   clopidogrel (PLAVIX) 75 MG tablet TAKE 1 TABLET(75 MG) BY MOUTH DAILY 90 tablet 1   cyanocobalamin (VITAMIN B12) 1000 MCG/ML injection INJECT 1 ML (1,000 MCG TOTAL) INTO THE MUSCLE once a week for 1 month.  Then inject 1 ml once a month thereafter. 6 mL 11   diphenhydramine-acetaminophen (TYLENOL PM) 25-500 MG TABS tablet Take  1 tablet by mouth at bedtime as needed (sleep).     hydrochlorothiazide (HYDRODIURIL) 25 MG tablet TAKE 1 TABLET(25 MG) BY MOUTH EVERY EVENING 90 tablet 1   hydrOXYzine (VISTARIL) 25 MG capsule Take 1 capsule (25 mg total) by mouth at bedtime and may repeat dose one time if needed. 60 capsule 0   levothyroxine (SYNTHROID) 75 MCG tablet Take 1 tablet (75 mcg total) by mouth daily before breakfast. 90 tablet 1   metoprolol succinate (TOPROL-XL) 25 MG 24 hr tablet TAKE 1/2 TABLET(12.5 MG) BY MOUTH DAILY 45 tablet 1   nitroGLYCERIN (NITROSTAT) 0.4 MG SL tablet Place 1 tablet (0.4 mg total) under the tongue every 5 (five) minutes as needed for chest pain. Up to 3 times. 90 tablet 3   predniSONE (STERAPRED UNI-PAK 21 TAB) 10 MG (21) TBPK tablet Take by mouth daily. Take 6 tabs by mouth daily  for 2 days, then 5 tabs for 2 days, then 4 tabs for 2 days, then 3 tabs for 2 days, 2 tabs for 2 days, then 1 tab by mouth daily for 2 days 42 tablet 0   SYRINGE-NEEDLE, DISP, 3 ML (BD SAFETYGLIDE SYRINGE/NEEDLE) 25G X 1" 3 ML MISC Use for B12 injections 100 each 11   No facility-administered medications prior to visit.      Review of Systems:   Constitutional: No weight loss or gain, night sweats, fevers, chills, fatigue, or lassitude. HEENT: No headaches, difficulty swallowing, tooth/dental problems, or sore throat. No sneezing, itching, ear ache, nasal congestion, or post nasal drip CV:  No chest pain, orthopnea, PND, swelling in lower extremities, anasarca, dizziness, palpitations, syncope Resp: +shortness of breath with exertion; cough (improved). No excess mucus or change in color of mucus. No hemoptysis. No wheezing.  No chest wall deformity GI:  No heartburn, indigestion GU: No dysuria, change in color of urine, urgency or frequency.   Skin: No rash, lesions, ulcerations MSK:  No joint pain or swelling.   Neuro: No dizziness or lightheadedness.  Psych: No depression or anxiety. Mood stable.     Physical Exam:  BP 136/74 (BP Location: Right Arm, Cuff Size: Large)   Pulse 72   Ht 5\' 6"  (1.676 m)   Wt 248 lb 9.6 oz (112.8 kg)   SpO2 99%   BMI 40.13 kg/m   GEN: Pleasant, interactive, well-appearing; obese; in no acute distress. HEENT:  Normocephalic and atraumatic. PERRLA. Sclera white. Nasal turbinates pink, moist and patent bilaterally. No rhinorrhea present. Oropharynx pink and moist, without exudate or edema. No lesions, ulcerations, or postnasal drip.  NECK:  Supple w/ fair ROM. No JVD present. Normal carotid impulses w/o bruits. Thyroid symmetrical with no goiter or nodules palpated. No lymphadenopathy.   CV: RRR, no m/r/g, no peripheral edema. Pulses intact, +2 bilaterally. No cyanosis, pallor or clubbing. PULMONARY:  Unlabored, regular breathing. Clear bilaterally A&P w/o wheezes/rales/rhonchi. No accessory muscle use.  GI: BS present and normoactive. Soft, non-tender to palpation. No organomegaly or masses detected.  MSK: No erythema, warmth or tenderness. Cap refil <2 sec all extrem. No deformities or joint swelling noted.  Neuro: A/Ox3. No focal deficits noted.   Skin: Warm, no  lesions or rashe Psych: Normal affect and behavior. Judgement and thought content appropriate.     Lab Results:  CBC    Component Value Date/Time   WBC 15.1 (H) 12/28/2022 1147   RBC 5.25 (H) 12/28/2022 1147   HGB 13.6 12/28/2022 1147   HGB 12.8 07/11/2021 0812   HCT  43.6 12/28/2022 1147   HCT 39.2 07/11/2021 0812   PLT 415 (H) 12/28/2022 1147   PLT 360 07/11/2021 0812   MCV 83.0 12/28/2022 1147   MCV 82 07/11/2021 0812   MCH 25.9 (L) 12/28/2022 1147   MCHC 31.2 12/28/2022 1147   RDW 15.6 (H) 12/28/2022 1147   RDW 14.0 07/11/2021 0812   LYMPHSABS 1.9 01/17/2022 0821   LYMPHSABS 2.1 07/11/2021 0812   MONOABS 0.5 01/17/2022 0821   EOSABS 0.1 01/17/2022 0821   EOSABS 0.2 07/11/2021 0812   BASOSABS 0.0 01/17/2022 0821   BASOSABS 0.1 07/11/2021 0812    BMET    Component Value Date/Time   NA 137 12/28/2022 1147   NA 139 07/19/2021 0815   K 3.3 (L) 12/28/2022 1147   CL 98 12/28/2022 1147   CO2 29 12/28/2022 1147   GLUCOSE 95 12/28/2022 1147   BUN 24 (H) 12/28/2022 1147   BUN 15 07/19/2021 0815   CREATININE 0.92 12/28/2022 1147   CALCIUM 9.1 12/28/2022 1147   GFRNONAA >60 12/28/2022 1147   GFRAA >60 12/26/2017 1038    BNP    Component Value Date/Time   BNP 22.3 12/28/2022 1214     Imaging:  CT Angio Chest PE W and/or Wo Contrast  Result Date: 12/28/2022 CLINICAL DATA:  Fever for 2 days and shortness of breath EXAM: CT ANGIOGRAPHY CHEST WITH CONTRAST TECHNIQUE: Multidetector CT imaging of the chest was performed using the standard protocol during bolus administration of intravenous contrast. Multiplanar CT image reconstructions and MIPs were obtained to evaluate the vascular anatomy. RADIATION DOSE REDUCTION: This exam was performed according to the departmental dose-optimization program which includes automated exposure control, adjustment of the mA and/or kV according to patient size and/or use of iterative reconstruction technique. CONTRAST:  75mL OMNIPAQUE  IOHEXOL 350 MG/ML SOLN COMPARISON:  Radiograph 12/28/2022 and CT chest 06/10/2022 FINDINGS: Cardiovascular: Negative for acute pulmonary embolism. No pericardial effusion. Coronary artery and aortic atherosclerotic calcification. Mediastinum/Nodes: Small hiatal hernia. Unremarkable trachea. No thoracic adenopathy. Lungs/Pleura: Mild mosaic attenuation in the upper lungs compatible with air trapping. No focal consolidation, pleural effusion or pneumothorax. Upper Abdomen: No acute abnormality. Musculoskeletal: No acute fracture. Review of the MIP images confirms the above findings. IMPRESSION: 1. Negative for acute pulmonary embolism. 2. Small hiatal hernia. Aortic Atherosclerosis (ICD10-I70.0). Electronically Signed   By: Minerva Fester M.D.   On: 12/28/2022 19:25   DG Chest 1 View  Result Date: 12/28/2022 CLINICAL DATA:  Shortness of breath, fever, cough. EXAM: CHEST  1 VIEW COMPARISON:  February 17, 2020. FINDINGS: The heart size and mediastinal contours are within normal limits. Both lungs are clear. The visualized skeletal structures are unremarkable. IMPRESSION: No active disease. Electronically Signed   By: Lupita Raider M.D.   On: 12/28/2022 14:34    albuterol (PROVENTIL) (2.5 MG/3ML) 0.083% nebulizer solution 2.5 mg     Date Action Dose Route User   01/01/2023 1347 Given 2.5 mg Nebulization Trenton Gammon, CMA          Latest Ref Rng & Units 12/03/2022   10:18 AM  PFT Results  FVC-Pre L 2.89   FVC-Predicted Pre % 89   FVC-Post L 2.87   FVC-Predicted Post % 88   Pre FEV1/FVC % % 85   Post FEV1/FCV % % 88   FEV1-Pre L 2.45   FEV1-Predicted Pre % 100   FEV1-Post L 2.52   DLCO uncorrected ml/min/mmHg 22.06   DLCO UNC% % 108   DLCO corrected  ml/min/mmHg 22.06   DLCO COR %Predicted % 108   DLVA Predicted % 111   TLC L 5.53   TLC % Predicted % 106   RV % Predicted % 104     Lab Results  Component Value Date   NITRICOXIDE 14 01/08/2023        Assessment & Plan:   DOE  (dyspnea on exertion) Constellation of symptoms, evidence of air trapping and positive response to bronchodilator and steroids, consistent with small airways disease and raises suspicion for underlying asthma diagnosis. No formal obstruction on PFTs. Will trial her on ICS/LABA therapy. Medication education provided and side effect profile reviewed. Teachback performed. Provided with SABA for rescue. Understands the difference in maintenance vs rescue inhalers. Provided with action plan.  Component of DOE likely related to deconditioning and obesity. Healthy weight loss encouraged. She is interested in the Provider Referral Exercise Program. Referral placed today.   Patient Instructions  Continue Albuterol inhaler 2 puffs every 6 hours as needed for shortness of breath or wheezing. Notify if symptoms persist despite rescue inhaler/neb use.  Complete prednisone   Trial Symbicort 2 puffs Twice daily. Brush tongue and rinse mouth afterwards. If this is not covered by insurance, let me know and we can find an alternative   Referral to P.R.E.P exercise program. Someone should contact you regarding this. If you haven't heard something in the next 2-3 weeks, please let us know   Follow up in 6 weeks with Dr. Vassie Loll or Philis Nettle. If symptoms do not improve or worsen, please contact office for sooner follow up or seek emergency care.    OSA on CPAP Excellent compliance and control.  Feels like new machine is working better for her.  Receives benefit from use.  Aware of safe driving practices.  Understands proper use/care of device.  Class 3 severe obesity with body mass index (BMI) of 40.0 to 44.9 in adult (HCC) BMI 40. Healthy weight loss encouraged. See above   I spent 35 minutes of dedicated to the care of this patient on the date of this encounter to include pre-visit review of records, face-to-face time with the patient discussing conditions above, post visit ordering of testing, clinical  documentation with the electronic health record, making appropriate referrals as documented, and communicating necessary findings to members of the patients care team.  Noemi Chapel, NP 01/09/2023  Pt aware and understands NP's role.

## 2023-01-09 ENCOUNTER — Encounter: Payer: Self-pay | Admitting: Nurse Practitioner

## 2023-01-09 DIAGNOSIS — Z6841 Body Mass Index (BMI) 40.0 and over, adult: Secondary | ICD-10-CM | POA: Insufficient documentation

## 2023-01-09 NOTE — Assessment & Plan Note (Addendum)
BMI 40. Healthy weight loss encouraged. See above

## 2023-01-09 NOTE — Assessment & Plan Note (Signed)
Excellent compliance and control.  Feels like new machine is working better for her.  Receives benefit from use.  Aware of safe driving practices.  Understands proper use/care of device.

## 2023-01-10 ENCOUNTER — Telehealth: Payer: Self-pay | Admitting: Nurse Practitioner

## 2023-01-10 NOTE — Telephone Encounter (Signed)
Patient states Symbicort is too expensive. Would like something else called into pharmacy. Pharmacy is [facility] [address] Patient phone number is [phone].

## 2023-01-11 ENCOUNTER — Telehealth: Payer: Self-pay | Admitting: *Deleted

## 2023-01-11 ENCOUNTER — Other Ambulatory Visit (HOSPITAL_COMMUNITY): Payer: Self-pay

## 2023-01-11 NOTE — Telephone Encounter (Signed)
Contacted regarding PREP class referral. Left voice message to return call for more information.

## 2023-01-14 NOTE — Telephone Encounter (Signed)
Can we check to see what formulary inhalers are. Pt states Symbicort too expensive.

## 2023-01-15 ENCOUNTER — Other Ambulatory Visit (HOSPITAL_COMMUNITY): Payer: Self-pay

## 2023-01-15 NOTE — Telephone Encounter (Signed)
Patient insurance is not contracted with [facility] so I am unable to see co-pays or do test claims

## 2023-01-16 NOTE — Telephone Encounter (Signed)
Left detailed message that patient will need to contact her insurance company to get formulary so that provider can change medication to something affordable. Pharmacy team unable to do formulary check.

## 2023-01-20 ENCOUNTER — Other Ambulatory Visit: Payer: Self-pay | Admitting: Internal Medicine

## 2023-01-23 ENCOUNTER — Other Ambulatory Visit: Payer: Self-pay | Admitting: Internal Medicine

## 2023-01-23 DIAGNOSIS — J209 Acute bronchitis, unspecified: Secondary | ICD-10-CM

## 2023-01-24 ENCOUNTER — Telehealth: Payer: Self-pay | Admitting: Internal Medicine

## 2023-01-24 DIAGNOSIS — I10 Essential (primary) hypertension: Secondary | ICD-10-CM

## 2023-01-24 MED ORDER — HYDROCHLOROTHIAZIDE 25 MG PO TABS
ORAL_TABLET | ORAL | 1 refills | Status: DC
Start: 2023-01-24 — End: 2023-10-14

## 2023-01-24 NOTE — Telephone Encounter (Signed)
Rx sent 

## 2023-01-24 NOTE — Telephone Encounter (Signed)
Prescription Request  01/24/2023  LOV: 01/01/2023  What is the name of the medication or equipment?  hydrochlorothiazide hydrochlorothiazide (HYDRODIURIL) 25 MG tablet  Have you contacted your pharmacy to request a refill? Yes   Pt states she contacted the pharmacy and was told they are waiting on MD. Pt states she has about 7-10 pills left.   Which pharmacy would you like this sent to?   [id number] - [place], [place] - [address] AT [facility] OF [address] [place] [address] Phone: [phone] Fax: [phone]  Patient notified that their request is being sent to the clinical staff for review and that they should receive a response within 2 business days.   Please advise at Mobile [phone] (mobile)       [id number] [place], [place] - [address] AT [facility] OF [address] Phone: [phone]  Fax: [phone]

## 2023-01-28 ENCOUNTER — Telehealth: Payer: Self-pay

## 2023-01-28 NOTE — Telephone Encounter (Signed)
Transition Care Management Follow-up Telephone Call Date of discharge and from where: [facility] 9/13 How have you been since you were released from the hospital? Pt stated she is doing ok and has been following up with providers Any questions or concerns? No  Items Reviewed: Did the pt receive and understand the discharge instructions provided? Yes  Medications obtained and verified? Yes  Other? No  Any new allergies since your discharge? No  Dietary orders reviewed? No Do you have support at home? Yes    Follow up appointments reviewed:  PCP Hospital f/u appt confirmed? Yes  Scheduled to see  on  @ . Specialist Hospital f/u appt confirmed? Yes  Scheduled to see  on  @ . Are transportation arrangements needed? No  If their condition worsens, is the pt aware to call PCP or go to the Emergency Dept.? Yes Was the patient provided with contact information for the PCP's office or ED? Yes Was to pt encouraged to call back with questions or concerns? Yes

## 2023-01-29 DIAGNOSIS — I671 Cerebral aneurysm, nonruptured: Secondary | ICD-10-CM | POA: Diagnosis not present

## 2023-02-03 ENCOUNTER — Other Ambulatory Visit: Payer: Self-pay | Admitting: Internal Medicine

## 2023-02-03 DIAGNOSIS — F411 Generalized anxiety disorder: Secondary | ICD-10-CM

## 2023-02-06 DIAGNOSIS — Z6841 Body Mass Index (BMI) 40.0 and over, adult: Secondary | ICD-10-CM | POA: Diagnosis not present

## 2023-02-06 DIAGNOSIS — I671 Cerebral aneurysm, nonruptured: Secondary | ICD-10-CM | POA: Diagnosis not present

## 2023-02-15 ENCOUNTER — Other Ambulatory Visit: Payer: Self-pay | Admitting: Internal Medicine

## 2023-02-21 ENCOUNTER — Ambulatory Visit: Payer: Medicare Other | Admitting: Nurse Practitioner

## 2023-02-26 ENCOUNTER — Ambulatory Visit: Payer: Medicare Other | Admitting: Internal Medicine

## 2023-03-20 ENCOUNTER — Ambulatory Visit: Payer: Medicare Other | Admitting: Internal Medicine

## 2023-03-20 ENCOUNTER — Other Ambulatory Visit: Payer: Self-pay | Admitting: Internal Medicine

## 2023-03-20 ENCOUNTER — Encounter: Payer: Self-pay | Admitting: Internal Medicine

## 2023-03-20 VITALS — BP 110/80 | HR 73 | Temp 97.7°F | Wt 253.4 lb

## 2023-03-20 DIAGNOSIS — R7302 Impaired glucose tolerance (oral): Secondary | ICD-10-CM

## 2023-03-20 DIAGNOSIS — E162 Hypoglycemia, unspecified: Secondary | ICD-10-CM | POA: Diagnosis not present

## 2023-03-20 DIAGNOSIS — E119 Type 2 diabetes mellitus without complications: Secondary | ICD-10-CM | POA: Insufficient documentation

## 2023-03-20 DIAGNOSIS — R5383 Other fatigue: Secondary | ICD-10-CM

## 2023-03-20 DIAGNOSIS — Z6841 Body Mass Index (BMI) 40.0 and over, adult: Secondary | ICD-10-CM | POA: Diagnosis not present

## 2023-03-20 LAB — POCT GLYCOSYLATED HEMOGLOBIN (HGB A1C): Hemoglobin A1C: 6.2 % — AB (ref 4.0–5.6)

## 2023-03-20 LAB — POCT GLUCOSE (DEVICE FOR HOME USE): POC Glucose: 146 mg/dL — AB (ref 70–99)

## 2023-03-20 MED ORDER — TIRZEPATIDE-WEIGHT MANAGEMENT 2.5 MG/0.5ML ~~LOC~~ SOLN: 2.5000 mg | SUBCUTANEOUS | 0 refills | Status: DC

## 2023-03-20 NOTE — Progress Notes (Signed)
Established Patient Office Visit     CC/Reason for Visit: Discuss acute concerns  HPI: Erika [name] is a 67 y.o. female who is coming in today for the above mentioned reasons.  Last Saturday she had about a 10-minute episode of extreme fatigue, lightheadedness and feeling of passing out.  She became concerned because her mother is a diabetic.  She thought her sugar might be low so had some peanut butter crackers, took a nap and when she woke up she felt back to her baseline.  This has not recurred.  She is morbidly obese with a BMI above 40.  She is interested in weight loss management.   Past Medical/Surgical History: Past Medical History:  Diagnosis Date   Anxiety    Arthritis    Bell's palsy    COPD (chronic obstructive pulmonary disease) (HCC)    History of cardiac murmur as a child    Hyperlipidemia    Hypertension    Menopausal syndrome    Mild asthma    no inhalers since stopped smoking   Myocardial infarction (HCC)    2020--pt states no damage   Neuroma of foot    OSA on CPAP    Wears glasses     Past Surgical History:  Procedure Laterality Date   CORONARY IMAGING/OCT N/A 07/14/2021   Procedure: INTRAVASCULAR IMAGING/OCT;  Surgeon: Orbie Pyo, MD;  [facility];  Service: Cardiovascular;  Laterality: N/A;   CORONARY PRESSURE/FFR STUDY N/A 07/14/2021   Procedure: INTRAVASCULAR PRESSURE WIRE/FFR STUDY;  Surgeon: Orbie Pyo, MD;  [facility]: [facility];  Service: Cardiovascular;  Laterality: N/A;   CORONARY STENT INTERVENTION N/A 07/14/2021   Procedure: CORONARY STENT INTERVENTION;  Surgeon: Orbie Pyo, MD;  [facility]: [facility];  Service: Cardiovascular;  Laterality: N/A;   ELBOW SURGERY Right 2002   repair tendon and nerve injury   EXCISION MORTON'S NEUROMA Left 2010   LEFT HEART CATH AND CORONARY ANGIOGRAPHY N/A 07/14/2021   Procedure: LEFT HEART CATH AND CORONARY ANGIOGRAPHY;  Surgeon: Orbie Pyo, MD;   [facility];  Service: Cardiovascular;  Laterality: N/A;   ROBOTIC ASSISTED TOTAL HYSTERECTOMY WITH BILATERAL SALPINGO OOPHERECTOMY N/A 12/31/2017   Procedure: XI ROBOTIC ASSISTED TOTAL HYSTERECTOMY WITH BILATERAL SALPINGO OOPHORECTOMY;  Surgeon: Adolphus Birchwood, MD;  Location: [facility];  Service: Gynecology;  Laterality: N/A;   SHOULDER ARTHROSCOPY W/ ROTATOR CUFF REPAIR Left 09/2016   and burectomy    Social History:  reports that she quit smoking about 22 years ago. Her smoking use included cigarettes. She started smoking about 52 years ago. She has a 30 pack-year smoking history. She has never used smokeless tobacco. She reports that she does not drink alcohol and does not use drugs.  Allergies: Allergies  Allergen Reactions   Tape Other (See Comments)    PATIENT PREFERS CLOTH TAPE- THE OTHERS DON'T AGREE WITH HER SKIN   Tetracycline Hcl Rash    Family History:  Family History  Problem Relation Age of Onset   Depression Mother    Diabetes Mother    Heart disease Mother    Asthma Sister    Cancer Maternal Aunt    Cancer Maternal Uncle    Uterine cancer Maternal Uncle    Breast cancer Paternal Aunt    Diabetes Maternal Grandmother    Diabetes Maternal Grandfather    Cancer Paternal Grandmother    Cancer Brother    Colon cancer Neg Hx  Colon polyps Neg Hx    Esophageal cancer Neg Hx    Rectal cancer Neg Hx    Stomach cancer Neg Hx      Current Outpatient Medications:    acetaminophen (TYLENOL) 650 MG CR tablet, Take 650-1,300 mg by mouth every 8 (eight) hours as needed for pain., Disp: , Rfl:    albuterol (VENTOLIN HFA) 108 (90 Base) MCG/ACT inhaler, Inhale 2 puffs into the lungs every 6 (six) hours as needed for wheezing or shortness of breath., Disp: 8 g, Rfl: 2   atorvastatin (LIPITOR) 40 MG tablet, TAKE 1 TABLET BY MOUTH EVERY DAY, Disp: 90 tablet, Rfl: 1   buPROPion (WELLBUTRIN XL) 150 MG 24 hr tablet, TAKE 1 TABLET (150 MG) BY MOUTH DAILY, Disp: 90  tablet, Rfl: 0   Cholecalciferol (VITAMIN D3 SUPER STRENGTH) 50 MCG (2000 UT) CAPS, Take 2,000 Units by mouth every evening., Disp: , Rfl:    citalopram (CELEXA) 40 MG tablet, Take 1 tablet (40 mg total) by mouth at bedtime., Disp: 90 tablet, Rfl: 1   clopidogrel (PLAVIX) 75 MG tablet, TAKE 1 TABLET(75 MG) BY MOUTH DAILY, Disp: 90 tablet, Rfl: 1   cyanocobalamin (VITAMIN B12) 1000 MCG/ML injection, INJECT 1 ML (1,000 MCG TOTAL) INTO THE MUSCLE once a week for 1 month.  Then inject 1 ml once a month thereafter., Disp: 6 mL, Rfl: 11   diphenhydramine-acetaminophen (TYLENOL PM) 25-500 MG TABS tablet, Take 1 tablet by mouth at bedtime as needed (sleep)., Disp: , Rfl:    hydrochlorothiazide (HYDRODIURIL) 25 MG tablet, TAKE 1 TABLET(25 MG) BY MOUTH EVERY EVENING, Disp: 90 tablet, Rfl: 1   hydrOXYzine (VISTARIL) 25 MG capsule, TAKE 1 CAPSULE (25 MG TOTAL) BY MOUTH AT BEDTIME AND MAY REPEAT DOSE ONE TIME IF NEEDED., Disp: 180 capsule, Rfl: 1   levothyroxine (SYNTHROID) 75 MCG tablet, Take 1 tablet (75 mcg total) by mouth daily before breakfast., Disp: 90 tablet, Rfl: 1   metoprolol succinate (TOPROL-XL) 25 MG 24 hr tablet, TAKE 1/2 TABLET BY MOUTH DAILY, Disp: 45 tablet, Rfl: 0   SYRINGE-NEEDLE, DISP, 3 ML (BD SAFETYGLIDE SYRINGE/NEEDLE) 25G X 1" 3 ML MISC, Use for B12 injections, Disp: 100 each, Rfl: 11   tirzepatide (ZEPBOUND) 2.5 MG/0.5ML injection vial, Inject 2.5 mg into the skin once a week., Disp: 2 mL, Rfl: 0   nitroGLYCERIN (NITROSTAT) 0.4 MG SL tablet, Place 1 tablet (0.4 mg total) under the tongue every 5 (five) minutes as needed for chest pain. Up to 3 times., Disp: 90 tablet, Rfl: 3  Review of Systems:  Negative unless indicated in HPI.   Physical Exam: Vitals:   03/20/23 1532  BP: 110/80  Pulse: 73  Temp: 97.7 F (36.5 C)  TempSrc: Oral  SpO2: 97%  Weight: 253 lb 6.4 oz (114.9 kg)    Body mass index is 40.9 kg/m.   Physical Exam Vitals reviewed.  Constitutional:       Appearance: Normal appearance.  HENT:     Head: Normocephalic and atraumatic.  Eyes:     Conjunctiva/sclera: Conjunctivae normal.     Pupils: Pupils are equal, round, and reactive to light.  Cardiovascular:     Rate and Rhythm: Normal rate and regular rhythm.     Heart sounds: Murmur heard.  Pulmonary:     Effort: Pulmonary effort is normal.     Breath sounds: Normal breath sounds.  Skin:    General: Skin is warm and dry.  Neurological:     General: No focal deficit  present.     Mental Status: She is alert and oriented to person, place, and time.  Psychiatric:        Mood and Affect: Mood normal.        Behavior: Behavior normal.        Thought Content: Thought content normal.        Judgment: Judgment normal.      Impression and Plan:  Hypoglycemia -     POCT Glucose (Device for Home Use) -     POCT glycosylated hemoglobin (Hb A1C)  Fatigue, unspecified type  Morbid obesity (HCC) -     Tirzepatide-Weight Management; Inject 2.5 mg into the skin once a week.  Dispense: 2 mL; Refill: 0  IGT (impaired glucose tolerance) -     Tirzepatide-Weight Management; Inject 2.5 mg into the skin once a week.  Dispense: 2 mL; Refill: 0   -Unclear etiology of her short-lived episode of extreme fatigue and lightheadedness.  Could have been hypotension, transient cardiac arrhythmia, viral illness among other things. -For now I think observation is adequate as she has fully recovered after only a few hours. -She is newly diagnosed with impaired glucose tolerance today with an A1c of 6.2.  She is also morbidly obese and would most certainly benefit from weight loss.  Will see if her insurance will cover Zepbound.  Time spent:33 minutes reviewing chart, interviewing and examining patient and formulating plan of care.     Chaya Jan, MD [facility] at [facility]

## 2023-04-03 ENCOUNTER — Encounter: Payer: Self-pay | Admitting: Internal Medicine

## 2023-04-03 ENCOUNTER — Ambulatory Visit (INDEPENDENT_AMBULATORY_CARE_PROVIDER_SITE_OTHER): Payer: Medicare Other | Admitting: Internal Medicine

## 2023-04-03 VITALS — BP 110/80 | HR 80 | Temp 98.2°F | Wt 252.2 lb

## 2023-04-03 DIAGNOSIS — J069 Acute upper respiratory infection, unspecified: Secondary | ICD-10-CM | POA: Diagnosis not present

## 2023-04-03 MED ORDER — BENZONATATE 100 MG PO CAPS
100.0000 mg | ORAL_CAPSULE | Freq: Two times a day (BID) | ORAL | 0 refills | Status: DC | PRN
Start: 2023-04-03 — End: 2023-07-08

## 2023-04-03 NOTE — Progress Notes (Signed)
Established Patient Office Visit     CC/Reason for Visit: URI symptoms  HPI: Erika [name] is a 67 y.o. female who is coming in today for the above mentioned reasons.  For the past 5 days has been experiencing cough with yellow sputum production, face, eye, ear pressure, postnasal drip, sore throat, rhinorrhea.   Past Medical/Surgical History: Past Medical History:  Diagnosis Date   Anxiety    Arthritis    Bell's palsy    COPD (chronic obstructive pulmonary disease) (HCC)    History of cardiac murmur as a child    Hyperlipidemia    Hypertension    Menopausal syndrome    Mild asthma    no inhalers since stopped smoking   Myocardial infarction (HCC)    2020--pt states no damage   Neuroma of foot    OSA on CPAP    Wears glasses     Past Surgical History:  Procedure Laterality Date   CORONARY IMAGING/OCT N/A 07/14/2021   Procedure: INTRAVASCULAR IMAGING/OCT;  Surgeon: Orbie Pyo, MD;  [facility];  Service: Cardiovascular;  Laterality: N/A;   CORONARY PRESSURE/FFR STUDY N/A 07/14/2021   Procedure: INTRAVASCULAR PRESSURE WIRE/FFR STUDY;  Surgeon: Orbie Pyo, MD;  [facility];  Service: Cardiovascular;  Laterality: N/A;   CORONARY STENT INTERVENTION N/A 07/14/2021   Procedure: CORONARY STENT INTERVENTION;  Surgeon: Orbie Pyo, MD;  [facility];  Service: Cardiovascular;  Laterality: N/A;   ELBOW SURGERY Right 2002   repair tendon and nerve injury   EXCISION MORTON'S NEUROMA Left 2010   LEFT HEART CATH AND CORONARY ANGIOGRAPHY N/A 07/14/2021   Procedure: LEFT HEART CATH AND CORONARY ANGIOGRAPHY;  Surgeon: Orbie Pyo, MD;  [facility];  Service: Cardiovascular;  Laterality: N/A;   ROBOTIC ASSISTED TOTAL HYSTERECTOMY WITH BILATERAL SALPINGO OOPHERECTOMY N/A 12/31/2017   Procedure: XI ROBOTIC ASSISTED TOTAL HYSTERECTOMY WITH BILATERAL SALPINGO OOPHORECTOMY;  Surgeon: Adolphus Birchwood, MD;   Location: [facility];  Service: Gynecology;  Laterality: N/A;   SHOULDER ARTHROSCOPY W/ ROTATOR CUFF REPAIR Left 09/2016   and burectomy    Social History:  reports that she quit smoking about 22 years ago. Her smoking use included cigarettes. She started smoking about 52 years ago. She has a 30 pack-year smoking history. She has never used smokeless tobacco. She reports that she does not drink alcohol and does not use drugs.  Allergies: Allergies  Allergen Reactions   Tape Other (See Comments)    PATIENT PREFERS CLOTH TAPE- THE OTHERS DON'T AGREE WITH HER SKIN   Tetracycline Hcl Rash    Family History:  Family History  Problem Relation Age of Onset   Depression Mother    Diabetes Mother    Heart disease Mother    Asthma Sister    Cancer Maternal Aunt    Cancer Maternal Uncle    Uterine cancer Maternal Uncle    Breast cancer Paternal Aunt    Diabetes Maternal Grandmother    Diabetes Maternal Grandfather    Cancer Paternal Grandmother    Cancer Brother    Colon cancer Neg Hx    Colon polyps Neg Hx    Esophageal cancer Neg Hx    Rectal cancer Neg Hx    Stomach cancer Neg Hx      Current Outpatient Medications:    acetaminophen (TYLENOL) 650 MG CR tablet, Take 650-1,300 mg by mouth every 8 (eight) hours as needed for  pain., Disp: , Rfl:    albuterol (VENTOLIN HFA) 108 (90 Base) MCG/ACT inhaler, Inhale 2 puffs into the lungs every 6 (six) hours as needed for wheezing or shortness of breath., Disp: 8 g, Rfl: 2   atorvastatin (LIPITOR) 40 MG tablet, TAKE 1 TABLET BY MOUTH EVERY DAY, Disp: 90 tablet, Rfl: 1   benzonatate (TESSALON) 100 MG capsule, Take 1 capsule (100 mg total) by mouth 2 (two) times daily as needed for cough., Disp: 20 capsule, Rfl: 0   buPROPion (WELLBUTRIN XL) 150 MG 24 hr tablet, TAKE 1 TABLET (150 MG) BY MOUTH DAILY, Disp: 90 tablet, Rfl: 0   Cholecalciferol (VITAMIN D3 SUPER STRENGTH) 50 MCG (2000 UT) CAPS, Take 2,000 Units by mouth every evening., Disp: , Rfl:     citalopram (CELEXA) 40 MG tablet, Take 1 tablet (40 mg total) by mouth at bedtime., Disp: 90 tablet, Rfl: 1   clopidogrel (PLAVIX) 75 MG tablet, TAKE 1 TABLET(75 MG) BY MOUTH DAILY, Disp: 90 tablet, Rfl: 1   cyanocobalamin (VITAMIN B12) 1000 MCG/ML injection, INJECT 1 ML (1,000 MCG TOTAL) INTO THE MUSCLE once a week for 1 month.  Then inject 1 ml once a month thereafter., Disp: 6 mL, Rfl: 11   diphenhydramine-acetaminophen (TYLENOL PM) 25-500 MG TABS tablet, Take 1 tablet by mouth at bedtime as needed (sleep)., Disp: , Rfl:    hydrochlorothiazide (HYDRODIURIL) 25 MG tablet, TAKE 1 TABLET(25 MG) BY MOUTH EVERY EVENING, Disp: 90 tablet, Rfl: 1   hydrOXYzine (VISTARIL) 25 MG capsule, TAKE 1 CAPSULE (25 MG TOTAL) BY MOUTH AT BEDTIME AND MAY REPEAT DOSE ONE TIME IF NEEDED., Disp: 180 capsule, Rfl: 1   levothyroxine (SYNTHROID) 75 MCG tablet, Take 1 tablet (75 mcg total) by mouth daily before breakfast., Disp: 90 tablet, Rfl: 1   metoprolol succinate (TOPROL-XL) 25 MG 24 hr tablet, TAKE 1/2 TABLET BY MOUTH DAILY, Disp: 45 tablet, Rfl: 0   SYRINGE-NEEDLE, DISP, 3 ML (BD SAFETYGLIDE SYRINGE/NEEDLE) 25G X 1" 3 ML MISC, Use for B12 injections, Disp: 100 each, Rfl: 11   nitroGLYCERIN (NITROSTAT) 0.4 MG SL tablet, Place 1 tablet (0.4 mg total) under the tongue every 5 (five) minutes as needed for chest pain. Up to 3 times., Disp: 90 tablet, Rfl: 3   ZEPBOUND 2.5 MG/0.5ML injection vial, INJECT 2.5 MG SUBCUTANEOUSLY WEEKLY (Patient not taking: Reported on 04/03/2023), Disp: 2 mL, Rfl: 0  Review of Systems:  Negative unless indicated in HPI.   Physical Exam: Vitals:   04/03/23 0716  BP: 110/80  Pulse: 80  Temp: 98.2 F (36.8 C)  TempSrc: Oral  SpO2: 98%  Weight: 252 lb 3.2 oz (114.4 kg)    Body mass index is 40.71 kg/m.   Physical Exam Vitals reviewed.  Constitutional:      Appearance: Normal appearance.  HENT:     Right Ear: Ear canal and external ear normal. A middle ear effusion is  present.     Left Ear: Ear canal and external ear normal. A middle ear effusion is present.     Mouth/Throat:     Mouth: Mucous membranes are moist.     Pharynx: Oropharynx is clear.  Eyes:     Conjunctiva/sclera: Conjunctivae normal.     Pupils: Pupils are equal, round, and reactive to light.  Cardiovascular:     Rate and Rhythm: Normal rate and regular rhythm.  Pulmonary:     Effort: Pulmonary effort is normal.     Breath sounds: Normal breath sounds.  Neurological:  Mental Status: She is alert.      Impression and Plan:  URI with cough and congestion -     Benzonatate; Take 1 capsule (100 mg total) by mouth 2 (two) times daily as needed for cough.  Dispense: 20 capsule; Refill: 0  -Given exam findings, PNA, pharyngitis, ear infection are not likely, hence abx have not been prescribed. -Have advised rest, fluids, OTC antihistamines, cough suppressants and mucinex. -RTC if no improvement in 10-14 days. -Tessalon Perles for cough   Time spent:23 minutes reviewing chart, interviewing and examining patient and formulating plan of care.     Chaya Jan, MD [facility]

## 2023-04-05 DIAGNOSIS — Z8709 Personal history of other diseases of the respiratory system: Secondary | ICD-10-CM | POA: Diagnosis not present

## 2023-04-05 DIAGNOSIS — Z20822 Contact with and (suspected) exposure to covid-19: Secondary | ICD-10-CM | POA: Diagnosis not present

## 2023-04-05 DIAGNOSIS — R051 Acute cough: Secondary | ICD-10-CM | POA: Diagnosis not present

## 2023-04-05 DIAGNOSIS — H6692 Otitis media, unspecified, left ear: Secondary | ICD-10-CM | POA: Diagnosis not present

## 2023-04-15 ENCOUNTER — Telehealth: Payer: Self-pay

## 2023-04-15 NOTE — Telephone Encounter (Signed)
Copied from CRM [id number]. Topic: Clinical - Medical Advice >> Apr 15, 2023  8:50 AM Gurney Maxin H wrote: Reason for CRM: Patient states she's had coughing and congestion for about 2 to 3 weeks. Left ear feels clogged and popping, states she's went through 5 bottles of cough syrup including 1 prescribed at urgent care and taking antibiotics. Patient has an appointment scheduled for Thursday 01/02 states she needs to come in sooner.  [place] [phone]

## 2023-04-16 NOTE — Telephone Encounter (Signed)
Spoke with patient she is aware we don't have any appointments sooner, advised patient to go to the UC if the symptoms get worse

## 2023-04-18 ENCOUNTER — Ambulatory Visit: Payer: Medicare Other | Admitting: Internal Medicine

## 2023-04-18 ENCOUNTER — Encounter: Payer: Self-pay | Admitting: Internal Medicine

## 2023-04-18 VITALS — BP 132/86 | HR 71 | Temp 98.0°F | Ht 66.0 in | Wt 252.5 lb

## 2023-04-18 DIAGNOSIS — F339 Major depressive disorder, recurrent, unspecified: Secondary | ICD-10-CM

## 2023-04-18 DIAGNOSIS — J069 Acute upper respiratory infection, unspecified: Secondary | ICD-10-CM

## 2023-04-18 NOTE — Progress Notes (Signed)
 Established Patient Office Visit     CC/Reason for Visit: Follow-up URI symptoms, depression  HPI: Erika [name] is a 68 y.o. female who is coming in today for the above mentioned reasons.  She was seen 2 weeks ago in clinic for URI and advised symptomatic measures.  2 days later she went to urgent care and was given Augmentin , dexamethasone  and promethazine cough syrup.  She has completed treatment.  Feels no different.  Has been feeling a lot of popping in her ears, still with a slight sore throat.  She feels like her depression is worse, believes that her Wellbutrin  and Celexa  are no longer working.  Does not currently have a therapist.   Past Medical/Surgical History: Past Medical History:  Diagnosis Date   Anxiety    Arthritis    Bell's palsy    COPD (chronic obstructive pulmonary disease) (HCC)    History of cardiac murmur as a child    Hyperlipidemia    Hypertension    Menopausal syndrome    Mild asthma    no inhalers since stopped smoking   Myocardial infarction (HCC)    2020--pt states no damage   Neuroma of foot    OSA on CPAP    Wears glasses     Past Surgical History:  Procedure Laterality Date   CORONARY IMAGING/OCT N/A 07/14/2021   Procedure: INTRAVASCULAR IMAGING/OCT;  Surgeon: Wendel Lurena POUR, MD;  [facility];  Service: Cardiovascular;  Laterality: N/A;   CORONARY PRESSURE/FFR STUDY N/A 07/14/2021   Procedure: INTRAVASCULAR PRESSURE WIRE/FFR STUDY;  Surgeon: Wendel Lurena POUR, MD;  [facility];  Service: Cardiovascular;  Laterality: N/A;   CORONARY STENT INTERVENTION N/A 07/14/2021   Procedure: CORONARY STENT INTERVENTION;  Surgeon: Wendel Lurena POUR, MD;  [facility];  Service: Cardiovascular;  Laterality: N/A;   ELBOW SURGERY Right 2002   repair tendon and nerve injury   EXCISION MORTON'S NEUROMA Left 2010   LEFT HEART CATH AND CORONARY ANGIOGRAPHY N/A 07/14/2021   Procedure: LEFT HEART CATH AND  CORONARY ANGIOGRAPHY;  Surgeon: Wendel Lurena POUR, MD;  [facility];  Service: Cardiovascular;  Laterality: N/A;   ROBOTIC ASSISTED TOTAL HYSTERECTOMY WITH BILATERAL SALPINGO OOPHERECTOMY N/A 12/31/2017   Procedure: XI ROBOTIC ASSISTED TOTAL HYSTERECTOMY WITH BILATERAL SALPINGO OOPHORECTOMY;  Surgeon: Eloy Herring, MD;  Location: [facility];  Service: Gynecology;  Laterality: N/A;   SHOULDER ARTHROSCOPY W/ ROTATOR CUFF REPAIR Left 09/2016   and burectomy    Social History:  reports that she quit smoking about 22 years ago. Her smoking use included cigarettes. She started smoking about 52 years ago. She has a 30 pack-year smoking history. She has never used smokeless tobacco. She reports that she does not drink alcohol  and does not use drugs.  Allergies: Allergies  Allergen Reactions   Tape Other (See Comments)    PATIENT PREFERS CLOTH TAPE- THE OTHERS DON'T AGREE WITH HER SKIN   Tetracycline Hcl Rash    Family History:  Family History  Problem Relation Age of Onset   Depression Mother    Diabetes Mother    Heart disease Mother    Asthma Sister    Cancer Maternal Aunt    Cancer Maternal Uncle    Uterine cancer Maternal Uncle    Breast cancer Paternal Aunt    Diabetes Maternal Grandmother    Diabetes Maternal Grandfather    Cancer Paternal Grandmother    Cancer Brother  Colon cancer Neg Hx    Colon polyps Neg Hx    Esophageal cancer Neg Hx    Rectal cancer Neg Hx    Stomach cancer Neg Hx      Current Outpatient Medications:    acetaminophen  (TYLENOL ) 650 MG CR tablet, Take 650-1,300 mg by mouth every 8 (eight) hours as needed for pain., Disp: , Rfl:    albuterol  (VENTOLIN  HFA) 108 (90 Base) MCG/ACT inhaler, Inhale 2 puffs into the lungs every 6 (six) hours as needed for wheezing or shortness of breath., Disp: 8 g, Rfl: 2   atorvastatin  (LIPITOR) 40 MG tablet, TAKE 1 TABLET BY MOUTH EVERY DAY, Disp: 90 tablet, Rfl: 1   benzonatate  (TESSALON ) 100 MG capsule, Take  1 capsule (100 mg total) by mouth 2 (two) times daily as needed for cough., Disp: 20 capsule, Rfl: 0   buPROPion  (WELLBUTRIN  XL) 150 MG 24 hr tablet, TAKE 1 TABLET (150 MG) BY MOUTH DAILY, Disp: 90 tablet, Rfl: 0   Cholecalciferol  (VITAMIN D3 SUPER STRENGTH) 50 MCG (2000 UT) CAPS, Take 2,000 Units by mouth every evening., Disp: , Rfl:    citalopram  (CELEXA ) 40 MG tablet, Take 1 tablet (40 mg total) by mouth at bedtime., Disp: 90 tablet, Rfl: 1   clopidogrel  (PLAVIX ) 75 MG tablet, TAKE 1 TABLET(75 MG) BY MOUTH DAILY, Disp: 90 tablet, Rfl: 1   cyanocobalamin  (VITAMIN B12) 1000 MCG/ML injection, INJECT 1 ML (1,000 MCG TOTAL) INTO THE MUSCLE once a week for 1 month.  Then inject 1 ml once a month thereafter., Disp: 6 mL, Rfl: 11   diphenhydramine-acetaminophen  (TYLENOL  PM) 25-500 MG TABS tablet, Take 1 tablet by mouth at bedtime as needed (sleep)., Disp: , Rfl:    hydrochlorothiazide  (HYDRODIURIL ) 25 MG tablet, TAKE 1 TABLET(25 MG) BY MOUTH EVERY EVENING, Disp: 90 tablet, Rfl: 1   hydrOXYzine  (VISTARIL ) 25 MG capsule, TAKE 1 CAPSULE (25 MG TOTAL) BY MOUTH AT BEDTIME AND MAY REPEAT DOSE ONE TIME IF NEEDED., Disp: 180 capsule, Rfl: 1   levothyroxine  (SYNTHROID ) 75 MCG tablet, Take 1 tablet (75 mcg total) by mouth daily before breakfast., Disp: 90 tablet, Rfl: 1   metoprolol  succinate (TOPROL -XL) 25 MG 24 hr tablet, TAKE 1/2 TABLET BY MOUTH DAILY, Disp: 45 tablet, Rfl: 0   nitroGLYCERIN  (NITROSTAT ) 0.4 MG SL tablet, Place 1 tablet (0.4 mg total) under the tongue every 5 (five) minutes as needed for chest pain. Up to 3 times., Disp: 90 tablet, Rfl: 3   SYRINGE-NEEDLE, DISP, 3 ML (BD SAFETYGLIDE SYRINGE/NEEDLE) 25G X 1 3 ML MISC, Use for B12 injections, Disp: 100 each, Rfl: 11   ZEPBOUND  2.5 MG/0.5ML injection vial, INJECT 2.5 MG SUBCUTANEOUSLY WEEKLY (Patient not taking: Reported on 04/03/2023), Disp: 2 mL, Rfl: 0  Review of Systems:  Negative unless indicated in HPI.   Physical Exam: Vitals:    04/18/23 0919  BP: 132/86  Pulse: 71  Temp: 98 F (36.7 C)  TempSrc: Oral  SpO2: 97%  Weight: 252 lb 8 oz (114.5 kg)  Height: 5' 6 (1.676 m)    Body mass index is 40.75 kg/m.   Physical Exam Vitals reviewed.  Constitutional:      Appearance: Normal appearance.  HENT:     Right Ear: A middle ear effusion is present.     Left Ear: A middle ear effusion is present.     Mouth/Throat:     Mouth: Mucous membranes are moist.     Pharynx: Posterior oropharyngeal erythema present.  Eyes:  Conjunctiva/sclera: Conjunctivae normal.     Pupils: Pupils are equal, round, and reactive to light.  Cardiovascular:     Rate and Rhythm: Normal rate and regular rhythm.  Pulmonary:     Effort: Pulmonary effort is normal.     Breath sounds: Normal breath sounds.  Neurological:     Mental Status: She is alert.      Impression and Plan:  Upper respiratory tract infection, unspecified type  Depression, recurrent (HCC)   -Suspect she currently has eustachian tube dysfunction.  Have advised use of guaifenesin and antihistamine. -For her depression have advised continued use of Wellbutrin  and Celexa , have given her information on how to schedule CBT.  She agrees that perhaps her URI has worsened her depression as she has been unable to be very active during the holiday season.  Time spent:31 minutes reviewing chart, interviewing and examining patient and formulating plan of care.     Tully Theophilus Andrews, MD [facility] at [facility]

## 2023-04-25 ENCOUNTER — Ambulatory Visit (INDEPENDENT_AMBULATORY_CARE_PROVIDER_SITE_OTHER): Payer: Medicare Other | Admitting: Nurse Practitioner

## 2023-04-25 ENCOUNTER — Encounter: Payer: Self-pay | Admitting: Nurse Practitioner

## 2023-04-25 VITALS — BP 126/81 | HR 76 | Ht 67.0 in | Wt 252.8 lb

## 2023-04-25 DIAGNOSIS — Z23 Encounter for immunization: Secondary | ICD-10-CM | POA: Diagnosis not present

## 2023-04-25 DIAGNOSIS — J019 Acute sinusitis, unspecified: Secondary | ICD-10-CM | POA: Diagnosis not present

## 2023-04-25 DIAGNOSIS — H6992 Unspecified Eustachian tube disorder, left ear: Secondary | ICD-10-CM

## 2023-04-25 DIAGNOSIS — J4541 Moderate persistent asthma with (acute) exacerbation: Secondary | ICD-10-CM | POA: Diagnosis not present

## 2023-04-25 DIAGNOSIS — R0609 Other forms of dyspnea: Secondary | ICD-10-CM | POA: Diagnosis not present

## 2023-04-25 DIAGNOSIS — H6692 Otitis media, unspecified, left ear: Secondary | ICD-10-CM

## 2023-04-25 MED ORDER — LEVOFLOXACIN 500 MG PO TABS: 500.0000 mg | ORAL_TABLET | Freq: Every day | ORAL | 0 refills | Status: AC

## 2023-04-25 MED ORDER — DULERA 100-5 MCG/ACT IN AERO: 2.0000 | INHALATION_SPRAY | Freq: Two times a day (BID) | RESPIRATORY_TRACT | 11 refills | Status: DC

## 2023-04-25 MED ORDER — PREDNISONE 20 MG PO TABS: 40.0000 mg | ORAL_TABLET | Freq: Every day | ORAL | 0 refills | Status: AC

## 2023-04-25 MED ORDER — FLUTICASONE PROPIONATE 50 MCG/ACT NA SUSP: 2.0000 | Freq: Every day | NASAL | 2 refills | Status: DC

## 2023-04-25 NOTE — Progress Notes (Signed)
 @Patient  ID: Erika [name], female    DOB: 1955-12-01, 68 y.o.   MRN: [id number]  Chief Complaint  Patient presents with   Follow-up    Pt complains of drainage in ears, sore throat & SOB/trouble breathing during exertion & bending down. Trouble staying asleep     Referring provider: Theophilus Andrews, Estel*  HPI: 68 year old female, former smoker followed for dyspnea on exertion and OSA on CPAP. She is a patient of Dr. Cyndi and last seen in office 01/08/2023 by [facility] NP. Past medical history significant for HTN, CHF, CAD, angina, vertigo, anxiety, depression, HLD.  TEST/EVENTS:  03/02/2021 echo: EF 60 to 65%.  LV diastolic parameters normal.  RV size and function normal.  Trivial MR. 07/14/2021 cardiac catheterization: EF 55 to 65%; LVEDP 22 mmHg 12/03/2022 PFT: FVC 89, FEV1 100, ratio 88, TLC 106, DLCOcor 108 12/28/2022 CTA chest: Negative for PE.  CAD/atherosclerosis.  Small hiatal hernia.  Mild mosaic attenuation in the upper lungs compatible with air trapping. 04/05/2023 CXR: clear   05/31/2022: OV with Dr. Jude. DOE and OSA. Hx of OSA dating back to 2004, AHI 22/h. Maintained on CPAP with good improvement in her daytime somnolence and fatigue. Lately if she feels like her breathing is worse at night and she cannot get enough air. Creates anxiety. Wishes she could do awake with her CPAP. Reports DOE for 1 year and gradually worsening. Worse on walking, climbing stairs or cleaning her house. Reports associated dry cough. Underwent cardiac evaluation including normal echo cardiac cath showed LVEDP of 22 but non significant CAD. CT chest unremarkable for 4 mm RUL. 1 year CT for follow up. No desaturations on walk test. Etiology for dyspnea not apparent. Cardiac workup was negative except for elevated LVEDP on cardiac cath. She was given lasix  without significant improvement. Does not appear fluid overloaded. There is no evidence of ILD or PH. PFTs ordered for further evaluation. DOE possibly  due to deconditioning and weight gain.   01/08/2023: OV with Jsoeph Podesta NP for follow up after undergoing pulmonary function testing, which were normal aside from some air trapping. She has been having trouble with a bout of bronchitis over the past month. She went to Urgent Care 9/7 and was treated with steroids. She felt better with this but symptoms quickly returned once off steroids. She felt like her breathing worsened so she went to the ED on 9/13. Workup was unremarkable. She had normal lab testing and CT imaging without evidence of acute infectious process or PE. She was treated with z pack and prednisone  taper. She then went to her PCP on 9/17 and was still experiencing increased dyspnea. Note reviewed; exam reassuring with clear lung sounds. She was advised to finish z pack and prednisone . She was also given a breathing treatment in office and sent home with albuterol  and atarax .  Today, she tells me she is finally feeling better. Symptoms started to improve middle to end of last week. She's on her last day of prednisone . She has used the albuterol  a couple times this week. She does feel like the steroids and inhaler help her a lot. Her cough is mostly gone. Breathing feels better than her usual with being on the steroids. It's just keeping her up at night. She usually gets short winded with activity. Has trouble doing things around the house, which has been normal for her for quite some time. Feels like she gasps for air at times with activity and sometimes at night when  she gets ready to lay down and go to sleep. She has never been on a daily inhaler. No history of childhood asthma. She denies any current problems with wheezing, fevers, chills, leg swelling, CP. She does want to start working on losing weight and some sort of exercise program. Thinks this is a contributing factor as well. Has had normal cardiac workup thus far.  12/09/2022-01/07/2023: CPAP 8-15 cmH2O 28/30 days; >4 hr 93%, average use 7 hr  43 min Pressure 95th 12 Leaks 95th 27.4 AHI 1.4  04/25/2023: Today - acute Discussed the use of AI scribe software for clinical note transcription with the patient, who gave verbal consent to proceed.  History of Present Illness   The patient, with a history of sinus problems, presented with a prolonged cough beginning mid December. She reported that the cough was productive, but the sputum was less thick than before. The patient also noted increased shortness of breath, which she attributed to the persistent coughing.  Previously, she had been treated with Augmentin  and steroids following a visit to urgent care 12/20, where a chest x-ray was performed and found to be clear. She was told she had an ear infection at this visit. She did feel like the ear pain was improved but never entirely resolved. Since completing, she's started to have more pain in the left ear and both feel full. No drainage from either ear or hearing changes. She went to her PCP last week who recommended Zyrtec and Mucinex for thirty days to manage her symptoms. She was also started on benzonatate  as needed for cough. She reported a slight improvement since starting this regimen.   The patient was prescribed Symbicort  at our last visit, but due to the high cost, she never started this. She has been using albuterol  instead. She reported using the albuterol  about once a day since being sick. She found the albuterol  to be helpful.  The patient also reported a history of similar symptoms in September and had experienced this condition four times in the past year. She is not currently using any sinus sprays, but was taking Zyrtec during allergy seasons.  In addition to the sinus problems, the patient reported headaches, particularly worse in the mornings, and a feeling of pressure and congestion. Mucus is yellow. No facial tenderness. No fevers, chills, hemoptysis, wheezing.   The patient's family members had also been sick around  the same time, suggesting a possible viral origin for the symptoms.       Allergies  Allergen Reactions   Tape Other (See Comments)    PATIENT PREFERS CLOTH TAPE- THE OTHERS DON'T AGREE WITH HER SKIN   Tetracycline Hcl Rash    Immunization History  Administered Date(s) Administered   Fluad Quad(high Dose 65+) 03/26/2020, 01/17/2022   Fluad Trivalent(High Dose 65+) 04/25/2023   Influenza Split 05/15/2011, 01/25/2014   Influenza Whole 01/06/2008, 02/03/2009   Influenza,inj,Quad PF,6+ Mos 02/05/2013, 01/29/2017   Influenza-Unspecified 01/14/2017, 01/27/2019   PFIZER(Purple Top)SARS-COV-2 Vaccination 07/17/2019, 08/11/2019, 03/04/2020   PNEUMOCOCCAL CONJUGATE-20 01/17/2022   Pneumococcal Conjugate-13 03/26/2020   Td 04/16/2004   Tdap 06/14/2014   Zoster Recombinant(Shingrix) 01/17/2021    Past Medical History:  Diagnosis Date   Anxiety    Arthritis    Bell's palsy    COPD (chronic obstructive pulmonary disease) (HCC)    History of cardiac murmur as a child    Hyperlipidemia    Hypertension    Menopausal syndrome    Mild asthma    no inhalers  since stopped smoking   Myocardial infarction (HCC)    2020--pt states no damage   Neuroma of foot    OSA on CPAP    Wears glasses     Tobacco History: Social History   Tobacco Use  Smoking Status Former   Current packs/day: 0.00   Average packs/day: 1 pack/day for 30.0 years (30.0 ttl pk-yrs)   Types: Cigarettes   Start date: 06/01/1970   Quit date: 06/01/2000   Years since quitting: 22.9  Smokeless Tobacco Never   Counseling given: Not Answered   Outpatient Medications Prior to Visit  Medication Sig Dispense Refill   acetaminophen  (TYLENOL ) 650 MG CR tablet Take 650-1,300 mg by mouth every 8 (eight) hours as needed for pain.     albuterol  (VENTOLIN  HFA) 108 (90 Base) MCG/ACT inhaler Inhale 2 puffs into the lungs every 6 (six) hours as needed for wheezing or shortness of breath. 8 g 2   atorvastatin  (LIPITOR) 40 MG  tablet TAKE 1 TABLET BY MOUTH EVERY DAY 90 tablet 1   benzonatate  (TESSALON ) 100 MG capsule Take 1 capsule (100 mg total) by mouth 2 (two) times daily as needed for cough. 20 capsule 0   buPROPion  (WELLBUTRIN  XL) 150 MG 24 hr tablet TAKE 1 TABLET (150 MG) BY MOUTH DAILY 90 tablet 0   Cholecalciferol  (VITAMIN D3 SUPER STRENGTH) 50 MCG (2000 UT) CAPS Take 2,000 Units by mouth every evening.     citalopram  (CELEXA ) 40 MG tablet Take 1 tablet (40 mg total) by mouth at bedtime. 90 tablet 1   clopidogrel  (PLAVIX ) 75 MG tablet TAKE 1 TABLET(75 MG) BY MOUTH DAILY 90 tablet 1   cyanocobalamin  (VITAMIN B12) 1000 MCG/ML injection INJECT 1 ML (1,000 MCG TOTAL) INTO THE MUSCLE once a week for 1 month.  Then inject 1 ml once a month thereafter. 6 mL 11   diphenhydramine-acetaminophen  (TYLENOL  PM) 25-500 MG TABS tablet Take 1 tablet by mouth at bedtime as needed (sleep).     hydrochlorothiazide  (HYDRODIURIL ) 25 MG tablet TAKE 1 TABLET(25 MG) BY MOUTH EVERY EVENING 90 tablet 1   hydrOXYzine  (VISTARIL ) 25 MG capsule TAKE 1 CAPSULE (25 MG TOTAL) BY MOUTH AT BEDTIME AND MAY REPEAT DOSE ONE TIME IF NEEDED. 180 capsule 1   levothyroxine  (SYNTHROID ) 75 MCG tablet Take 1 tablet (75 mcg total) by mouth daily before breakfast. 90 tablet 1   metoprolol  succinate (TOPROL -XL) 25 MG 24 hr tablet TAKE 1/2 TABLET BY MOUTH DAILY 45 tablet 0   SYRINGE-NEEDLE, DISP, 3 ML (BD SAFETYGLIDE SYRINGE/NEEDLE) 25G X 1 3 ML MISC Use for B12 injections 100 each 11   nitroGLYCERIN  (NITROSTAT ) 0.4 MG SL tablet Place 1 tablet (0.4 mg total) under the tongue every 5 (five) minutes as needed for chest pain. Up to 3 times. 90 tablet 3   ZEPBOUND  2.5 MG/0.5ML injection vial INJECT 2.5 MG SUBCUTANEOUSLY WEEKLY (Patient not taking: Reported on 04/25/2023) 2 mL 0   No facility-administered medications prior to visit.     Review of Systems:   Constitutional: No weight loss or gain, night sweats, fevers, chills, or lassitude. +fatigue  HEENT: No  difficulty swallowing, tooth/dental problems. No sneezing, itching +ear ache, headaches, nasal congestion, post nasal drip, sore throat  CV:  No chest pain, orthopnea, PND, swelling in lower extremities, anasarca, dizziness, palpitations, syncope Resp: +shortness of breath with exertion; cough. No hemoptysis. No wheezing.  No chest wall deformity GI:  No heartburn, indigestion GU: No dysuria, change in color of urine, urgency or frequency.  Skin: No rash, lesions, ulcerations MSK:  No joint pain or swelling.   Neuro: No dizziness or lightheadedness.  Psych: No depression or anxiety. Mood stable.     Physical Exam:  BP 126/81   Pulse 76   Ht 5' 7 (1.702 m)   Wt 252 lb 12.8 oz (114.7 kg)   SpO2 95%   BMI 39.59 kg/m   GEN: Pleasant, interactive, well-appearing; obese; in no acute distress. HEENT:  Normocephalic and atraumatic. PERRLA. Sclera white. EACs patent bilaterally. TM pearly gray with present light reflex right. Bulging, erythematous left TM with purulent fluid present; TM intact; dull light reflex. Nasal turbinates erythematous, moist and patent bilaterally. White rhinorrhea present. Oropharynx pink and moist, without exudate or edema. No lesions, ulcerations. No sinus tenderness.  NECK:  Supple w/ fair ROM. No JVD present. Normal carotid impulses w/o bruits. Thyroid  symmetrical with no goiter or nodules palpated. No lymphadenopathy.   CV: RRR, no m/r/g, no peripheral edema. Pulses intact, +2 bilaterally. No cyanosis, pallor or clubbing. PULMONARY:  Unlabored, regular breathing. Clear bilaterally A&P w/o wheezes/rales/rhonchi. No accessory muscle use.  GI: BS present and normoactive. Soft, non-tender to palpation. No organomegaly or masses detected.  MSK: No erythema, warmth or tenderness. Cap refil <2 sec all extrem. No deformities or joint swelling noted.  Neuro: A/Ox3. No focal deficits noted.   Skin: Warm, no lesions or rashe Psych: Normal affect and behavior. Judgement and  thought content appropriate.     Lab Results:  CBC    Component Value Date/Time   WBC 15.1 (H) 12/28/2022 1147   RBC 5.25 (H) 12/28/2022 1147   HGB 13.6 12/28/2022 1147   HGB 12.8 07/11/2021 0812   HCT 43.6 12/28/2022 1147   HCT 39.2 07/11/2021 0812   PLT 415 (H) 12/28/2022 1147   PLT 360 07/11/2021 0812   MCV 83.0 12/28/2022 1147   MCV 82 07/11/2021 0812   MCH 25.9 (L) 12/28/2022 1147   MCHC 31.2 12/28/2022 1147   RDW 15.6 (H) 12/28/2022 1147   RDW 14.0 07/11/2021 0812   LYMPHSABS 1.9 01/17/2022 0821   LYMPHSABS 2.1 07/11/2021 0812   MONOABS 0.5 01/17/2022 0821   EOSABS 0.1 01/17/2022 0821   EOSABS 0.2 07/11/2021 0812   BASOSABS 0.0 01/17/2022 0821   BASOSABS 0.1 07/11/2021 0812    BMET    Component Value Date/Time   NA 137 12/28/2022 1147   NA 139 07/19/2021 0815   K 3.3 (L) 12/28/2022 1147   CL 98 12/28/2022 1147   CO2 29 12/28/2022 1147   GLUCOSE 95 12/28/2022 1147   BUN 24 (H) 12/28/2022 1147   BUN 15 07/19/2021 0815   CREATININE 0.92 12/28/2022 1147   CALCIUM  9.1 12/28/2022 1147   GFRNONAA >60 12/28/2022 1147   GFRAA >60 12/26/2017 1038    BNP    Component Value Date/Time   BNP 22.3 12/28/2022 1214     Imaging:  No results found.  Administration History     None          Latest Ref Rng & Units 12/03/2022   10:18 AM  PFT Results  FVC-Pre L 2.89   FVC-Predicted Pre % 89   FVC-Post L 2.87   FVC-Predicted Post % 88   Pre FEV1/FVC % % 85   Post FEV1/FCV % % 88   FEV1-Pre L 2.45   FEV1-Predicted Pre % 100   FEV1-Post L 2.52   DLCO uncorrected ml/min/mmHg 22.06   DLCO UNC% % 108   DLCO corrected  ml/min/mmHg 22.06   DLCO COR %Predicted % 108   DLVA Predicted % 111   TLC L 5.53   TLC % Predicted % 106   RV % Predicted % 104     Lab Results  Component Value Date   NITRICOXIDE 14 01/08/2023        Assessment & Plan:   No problem-specific Assessment & Plan notes found for this encounter. Assessment and Plan    Acute  Sinusitis Ongoing sinus problems since mid-December, with four similar episodes last year. Previous treatment with Zyrtec, Mucinex, Augmentin , and steroids. Chest x-ray clear. Sinus congestion likely contributing to shortness of breath and recurrent AOM. Discussed saline nasal rinses, Flonase , and Afrin for 3-5 days to promote drainage. Informed about proper technique for nasal rinses. Check allergen panel to assess for environmental triggers.  - Initiate saline nasal rinses 1-2 times a day - Use Flonase  nasal spray, 2 sprays each nostril daily - Use Afrin nasal spray for 3-5 days - Continue Mucinex  Reactive airway disease/DOE Constellation of symptoms, evidence of air trapping and positive response to bronchodilator and steroids, consistent with small airways disease and raises suspicion for underlying asthma diagnosis. No formal obstruction on previous PFTs. Previous plan was to trial her on ICS/LABA therapy with Symbicort  but she never started due to cost. DOE and cough likely related to sinusitis; however, she does receive benefit from SABA. Will treat her with prednisone  burst and start her on Dulera  for maintenance. This is listed on her formulary. Medication education provided and side effect profile reviewed. Teachback performed. Continue SABA for rescue. Understands the difference in maintenance vs rescue inhalers. Provided with action plan. Repeat CXR if respiratory symptoms fail to improve or worsens. Recent imaging clear.  - Continue using albuterol  as needed - Start Dulera  inhaler, 2 puffs twice a day - Oral hygiene instructions reviewed following ICS  Acute Otitis Media Left recurrent AOM. Previous treatment with Augmentin  for 10 days. Will change class and treat with fluoroquinolone. Informed about potential side effects of Levaquin , including C. diff and tendon rupture, and advised to monitor for symptoms. - Prescribe Levaquin  500 mg daily for 7 days - Advise taking a probiotic -  Monitor for side effects - Return precautions - Consider daily decongestant like Zyrtec D or plain Sudafed, monitor blood pressure  OSA on CPAP Not addressed at OV. Previously compliant. Will reassess at f/u. - Continue CPAP nightly  Follow-up - Schedule follow-up appointment in 3-4 weeks - Contact provider if symptoms worsen or new symptoms develop.         I spent 45 minutes of dedicated to the care of this patient on the date of this encounter to include pre-visit review of records, face-to-face time with the patient discussing conditions above, post visit ordering of testing, clinical documentation with the electronic health record, making appropriate referrals as documented, and communicating necessary findings to members of the patients care team.  Comer LULLA Rouleau, NP 04/25/2023  Pt aware and understands NP's role.

## 2023-04-25 NOTE — Patient Instructions (Addendum)
 Continue Albuterol  inhaler 2 puffs every 6 hours as needed for shortness of breath or wheezing. Notify if symptoms persist despite rescue inhaler/neb use.  Continue guaifenesin [phone] mg Twice daily for congestion Continue zyrtec 1 tab daily    -Trial Dulera  2 puffs Twice daily. Brush tongue and rinse mouth afterwards. If this is not covered by insurance, let me know and we can find an alternative  -Saline nasal rinses 1-2 times a day with bottled, distilled water . Follow 30 minutes later with flonase  nasal spray 2 sprays each nostril daily and afrin nasal spray for 3-5 days. Do not use the afrin longer than 3-5 days -You can also use pseudoephedrine (sudafed or generic version) over the counter for the next 5-7 days to help with drainage. Monitor your BP and stop if you are consistently above 140/90 -Levaquin  500 mg once daily for 7 days. Take with food. Monitor for any new tendon pain and notify if this develops. If you develop watery bowel movements, please let me know immediately as this could be a sign of an infection called C diff -Prednisone  40 mg daily for 5 days. Take in AM with food    Continue CPAP nightly  Labs today - allergen panel  Follow up in 3-4 weeks with Erika [name] or Erika Nabeel Gladson,Erika [name]. If symptoms do not improve or worsen, please contact office for sooner follow up or seek emergency care.

## 2023-04-30 LAB — ALLERGEN PANEL (27) + IGE
Alternaria Alternata IgE: <0.1 kU/L
Aspergillus Fumigatus IgE: <0.1 kU/L
Bahia Grass IgE: <0.1 kU/L
Bermuda Grass IgE: <0.1 kU/L
Cat Dander IgE: <0.1 kU/L
Cedar, Mountain IgE: <0.1 kU/L
Cladosporium Herbarum IgE: <0.1 kU/L
Cocklebur IgE: <0.1 kU/L
Cockroach, American IgE: <0.1 kU/L
Common Silver Birch IgE: <0.1 kU/L
D Farinae IgE: <0.1 kU/L
D Pteronyssinus IgE: <0.1 kU/L
Dog Dander IgE: <0.1 kU/L
Elm, American IgE: <0.1 kU/L
Hickory, White IgE: <0.1 kU/L
IgE (Immunoglobulin E), Serum: 37 [IU]/mL (ref 6–495)
Johnson Grass IgE: <0.1 kU/L
Kentucky Bluegrass IgE: <0.1 kU/L
Maple/Box Elder IgE: <0.1 kU/L
Mucor Racemosus IgE: <0.1 kU/L
Oak, White IgE: <0.1 kU/L
Penicillium Chrysogen IgE: <0.1 kU/L
Pigweed, Rough IgE: <0.1 kU/L
Plantain, English IgE: <0.1 kU/L
Ragweed, Short IgE: <0.1 kU/L
Setomelanomma Rostrat: <0.1 kU/L
Timothy Grass IgE: <0.1 kU/L
White Mulberry IgE: <0.1 kU/L

## 2023-05-03 ENCOUNTER — Other Ambulatory Visit: Payer: Self-pay | Admitting: Internal Medicine

## 2023-05-03 DIAGNOSIS — E039 Hypothyroidism, unspecified: Secondary | ICD-10-CM

## 2023-05-03 DIAGNOSIS — F3342 Major depressive disorder, recurrent, in full remission: Secondary | ICD-10-CM

## 2023-05-15 ENCOUNTER — Telehealth: Payer: Self-pay | Admitting: Nurse Practitioner

## 2023-05-15 NOTE — Telephone Encounter (Signed)
PT still sick after Antibx course. Per AVS she was told to call if symptoms worsened. Pls call her @ [phone]

## 2023-05-15 NOTE — Telephone Encounter (Signed)
She had recurrent ear infection at our last visit and has done multiple abx courses. She needs to go to UC or see her PCP if she's having recurrent symptoms.

## 2023-05-15 NOTE — Telephone Encounter (Signed)
Patient is returning missed call [phone]

## 2023-05-15 NOTE — Telephone Encounter (Signed)
Spoke with the pt  She is c/o bilateral ear pain  Increased cough and sinus pain/pressure  She has a prod cough with yellow sputum  She denies any fevers, aches  She used her albuterol inhaler and this helped some  She finished her levaquin and pred and is using sinus rinses and flonase  She states that she has not tried the pseudofed yet  I advised to try this as rec by Florentina Addison recently  Please advise, if there are any other recs thanks  Allergies  Allergen Reactions   Tape Other (See Comments)    PATIENT PREFERS CLOTH TAPE- THE OTHERS DON'T AGREE WITH HER SKIN   Tetracycline Hcl Rash

## 2023-05-15 NOTE — Telephone Encounter (Signed)
I called pt and there was no answer- LMTCB.

## 2023-05-15 NOTE — Telephone Encounter (Signed)
Called the pt and there was no answer- LMTCB

## 2023-05-16 NOTE — Telephone Encounter (Signed)
Pt notified. NFN

## 2023-05-17 NOTE — Telephone Encounter (Signed)
 NFN

## 2023-05-27 ENCOUNTER — Ambulatory Visit (INDEPENDENT_AMBULATORY_CARE_PROVIDER_SITE_OTHER): Payer: No Typology Code available for payment source | Admitting: Nurse Practitioner

## 2023-05-27 ENCOUNTER — Encounter: Payer: Self-pay | Admitting: Nurse Practitioner

## 2023-05-27 ENCOUNTER — Other Ambulatory Visit: Payer: Self-pay | Admitting: Nurse Practitioner

## 2023-05-27 VITALS — BP 116/82 | HR 68 | Ht 67.0 in | Wt 251.0 lb

## 2023-05-27 DIAGNOSIS — J019 Acute sinusitis, unspecified: Secondary | ICD-10-CM

## 2023-05-27 DIAGNOSIS — G4733 Obstructive sleep apnea (adult) (pediatric): Secondary | ICD-10-CM | POA: Diagnosis not present

## 2023-05-27 DIAGNOSIS — J4530 Mild persistent asthma, uncomplicated: Secondary | ICD-10-CM

## 2023-05-27 DIAGNOSIS — J329 Chronic sinusitis, unspecified: Secondary | ICD-10-CM | POA: Diagnosis not present

## 2023-05-27 DIAGNOSIS — H6692 Otitis media, unspecified, left ear: Secondary | ICD-10-CM

## 2023-05-27 MED ORDER — AIRSUPRA 90-80 MCG/ACT IN AERO: 1.0000 | INHALATION_SPRAY | Freq: Four times a day (QID) | RESPIRATORY_TRACT | 2 refills | Status: DC | PRN

## 2023-05-27 NOTE — Progress Notes (Addendum)
@Patient  ID: Erika [name], female    DOB: 27-Dec-1955, 68 y.o.   MRN: [id number]  Chief Complaint  Patient presents with   Follow-up    Pt  states she has a cough since 12/24 gets worse when hot/ over heated     Referring provider: Philip Aspen, [place]*  HPI: 68 year old female, former smoker followed for dyspnea on exertion and OSA on CPAP. She is a patient of Dr. Reginia Naas and last seen in office 04/25/2023 by [facility] NP. Past medical history significant for HTN, CHF, CAD, angina, vertigo, anxiety, depression, HLD.  TEST/EVENTS:  03/02/2021 echo: EF 60 to 65%.  LV diastolic parameters normal.  RV size and function normal.  Trivial MR. 07/14/2021 cardiac catheterization: EF 55 to 65%; LVEDP 22 mmHg 12/03/2022 PFT: FVC 89, FEV1 100, ratio 88, TLC 106, DLCOcor 108 12/28/2022 CTA chest: Negative for PE.  CAD/atherosclerosis.  Small hiatal hernia.  Mild mosaic attenuation in the upper lungs compatible with air trapping. 04/05/2023 CXR: clear   05/31/2022: OV with Dr. Vassie Loll. DOE and OSA. Hx of OSA dating back to 2004, AHI 22/h. Maintained on CPAP with good improvement in her daytime somnolence and fatigue. Lately if she feels like her breathing is worse at night and she cannot get enough air. Creates anxiety. Wishes she could do awake with her CPAP. Reports DOE for 1 year and gradually worsening. Worse on walking, climbing stairs or cleaning her house. Reports associated dry cough. Underwent cardiac evaluation including normal echo cardiac cath showed LVEDP of 22 but non significant CAD. CT chest unremarkable for 4 mm RUL. 1 year CT for follow up. No desaturations on walk test. Etiology for dyspnea not apparent. Cardiac workup was negative except for elevated LVEDP on cardiac cath. She was given lasix without significant improvement. Does not appear fluid overloaded. There is no evidence of ILD or PH. PFTs ordered for further evaluation. DOE possibly due to deconditioning and weight gain.   01/08/2023: OV  with Ledonna Dormer NP for follow up after undergoing pulmonary function testing, which were normal aside from some air trapping. She has been having trouble with a bout of bronchitis over the past month. She went to Urgent Care 9/7 and was treated with steroids. She felt better with this but symptoms quickly returned once off steroids. She felt like her breathing worsened so she went to the ED on 9/13. Workup was unremarkable. She had normal lab testing and CT imaging without evidence of acute infectious process or PE. She was treated with z pack and prednisone taper. She then went to her PCP on 9/17 and was still experiencing increased dyspnea. Note reviewed; exam reassuring with clear lung sounds. She was advised to finish z pack and prednisone. She was also given a breathing treatment in office and sent home with albuterol and atarax.  Today, she tells me she is finally feeling better. Symptoms started to improve middle to end of last week. She's on her last day of prednisone. She has used the albuterol a couple times this week. She does feel like the steroids and inhaler help her a lot. Her cough is mostly gone. Breathing feels better than her usual with being on the steroids. It's just keeping her up at night. She usually gets short winded with activity. Has trouble doing things around the house, which has been normal for her for quite some time. Feels like she gasps for air at times with activity and sometimes at night when she gets ready to  lay down and go to sleep. She has never been on a daily inhaler. No history of childhood asthma. She denies any current problems with wheezing, fevers, chills, leg swelling, CP. She does want to start working on losing weight and some sort of exercise program. Thinks this is a contributing factor as well. Has had normal cardiac workup thus far.  12/09/2022-01/07/2023: CPAP 8-15 cmH2O 28/30 days; >4 hr 93%, average use 7 hr 43 min Pressure 95th 12 Leaks 95th 27.4 AHI  1.4  04/25/2023: OV with Yury Schaus NP. The patient, with a history of sinus problems, presented with a prolonged cough beginning mid December. She reported that the cough was productive, but the sputum was less thick than before. The patient also noted increased shortness of breath, which she attributed to the persistent coughing. Previously, she had been treated with Augmentin and steroids following a visit to urgent care 12/20, where a chest x-ray was performed and found to be clear. She was told she had an ear infection at this visit. She did feel like the ear pain was improved but never entirely resolved. Since completing, she's started to have more pain in the left ear and both feel full. No drainage from either ear or hearing changes. She went to her PCP last week who recommended Zyrtec and Mucinex for thirty days to manage her symptoms. She was also started on benzonatate as needed for cough. She reported a slight improvement since starting this regimen.  The patient was prescribed Symbicort at our last visit, but due to the high cost, she never started this. She has been using albuterol instead. She reported using the albuterol about once a day since being sick. She found the albuterol to be helpful. The patient also reported a history of similar symptoms in September and had experienced this condition four times in the past year. She is not currently using any sinus sprays, but was taking Zyrtec during allergy seasons. In addition to the sinus problems, the patient reported headaches, particularly worse in the mornings, and a feeling of pressure and congestion. Mucus is yellow. No facial tenderness. No fevers, chills, hemoptysis, wheezing. The patient's family members had also been sick around the same time, suggesting a possible viral origin for the symptoms.   05/27/2023: Today - follow up Patient presents today for follow up. At our last visit, we treated her for recurrent AOM and sinusitis with  levaquin. She improved while on abx then called in on 1/29 with return of symptoms. She was advised to have in person evaluation. She tells me today that she ended up waiting and has felt better. Ear pain and pressure has resolved. Sinuses feel back to normal. Not having much congestion. Her cough is minimal now. She hasn't had to use her albuterol in the past week or so. She denies fevers, chills, sore throat, shortness of breath or wheezing. She never got the [facility] because of cost.  Wears CPAP nightly. Feels refreshed. No drowsy driving or sleep parasomnias/paralysis.   FeNO 27 ppb  04/24/2023-05/23/2023: CPAP 8-15 cmH2O 30/30 days; 100% >4 hr; average use 8 hr 47 min Pressure 95th 12.8 Leaks 95th 25.1 AHI 1.6  Allergies  Allergen Reactions   Tape Other (See Comments)    PATIENT PREFERS CLOTH TAPE- THE OTHERS DON'T AGREE WITH HER SKIN   Tetracycline Hcl Rash    Immunization History  Administered Date(s) Administered   Fluad Quad(high Dose 65+) 03/26/2020, 01/17/2022   Fluad Trivalent(High Dose 65+) 04/25/2023   Influenza Split  05/15/2011, 01/25/2014   Influenza Whole 01/06/2008, 02/03/2009   Influenza,inj,Quad PF,6+ Mos 02/05/2013, 01/29/2017   Influenza-Unspecified 01/14/2017, 01/27/2019   PFIZER(Purple Top)SARS-COV-2 Vaccination 07/17/2019, 08/11/2019, 03/04/2020   PNEUMOCOCCAL CONJUGATE-20 01/17/2022   Pneumococcal Conjugate-13 03/26/2020   Td 04/16/2004   Tdap 06/14/2014   Zoster Recombinant(Shingrix) 01/17/2021    Past Medical History:  Diagnosis Date   Anxiety    Arthritis    Bell's palsy    COPD (chronic obstructive pulmonary disease) (HCC)    History of cardiac murmur as a child    Hyperlipidemia    Hypertension    Menopausal syndrome    Mild asthma    no inhalers since stopped smoking   Myocardial infarction (HCC)    2020--pt states no damage   Neuroma of foot    OSA on CPAP    Wears glasses     Tobacco History: Social History   Tobacco Use  Smoking  Status Former   Current packs/day: 0.00   Average packs/day: 1 pack/day for 30.0 years (30.0 ttl pk-yrs)   Types: Cigarettes   Start date: 06/01/1970   Quit date: 06/01/2000   Years since quitting: 23.0  Smokeless Tobacco Never   Counseling given: Not Answered   Outpatient Medications Prior to Visit  Medication Sig Dispense Refill   acetaminophen (TYLENOL) 650 MG CR tablet Take 650-1,300 mg by mouth every 8 (eight) hours as needed for pain.     atorvastatin (LIPITOR) 40 MG tablet TAKE 1 TABLET BY MOUTH EVERY DAY 90 tablet 1   benzonatate (TESSALON) 100 MG capsule Take 1 capsule (100 mg total) by mouth 2 (two) times daily as needed for cough. 20 capsule 0   buPROPion (WELLBUTRIN XL) 150 MG 24 hr tablet TAKE 1 TABLET (150 MG) BY MOUTH DAILY 90 tablet 0   Cholecalciferol (VITAMIN D3 SUPER STRENGTH) 50 MCG (2000 UT) CAPS Take 2,000 Units by mouth every evening.     citalopram (CELEXA) 40 MG tablet TAKE 1 TABLET BY MOUTH EVERYDAY AT BEDTIME 90 tablet 1   clopidogrel (PLAVIX) 75 MG tablet TAKE 1 TABLET (75 MG) BY MOUTH DAILY 90 tablet 1   cyanocobalamin (VITAMIN B12) 1000 MCG/ML injection INJECT 1 ML (1,000 MCG TOTAL) INTO THE MUSCLE once a week for 1 month.  Then inject 1 ml once a month thereafter. 6 mL 11   diphenhydramine-acetaminophen (TYLENOL PM) 25-500 MG TABS tablet Take 1 tablet by mouth at bedtime as needed (sleep).     fluticasone (FLONASE) 50 MCG/ACT nasal spray Place 2 sprays into both nostrils daily. 18.2 mL 2   hydrochlorothiazide (HYDRODIURIL) 25 MG tablet TAKE 1 TABLET(25 MG) BY MOUTH EVERY EVENING 90 tablet 1   hydrOXYzine (VISTARIL) 25 MG capsule TAKE 1 CAPSULE (25 MG TOTAL) BY MOUTH AT BEDTIME AND MAY REPEAT DOSE ONE TIME IF NEEDED. 180 capsule 1   levothyroxine (SYNTHROID) 75 MCG tablet TAKE 1 TABLET BY MOUTH DAILY BEFORE BREAKFAST. 90 tablet 1   metoprolol succinate (TOPROL-XL) 25 MG 24 hr tablet TAKE 1/2 TABLET BY MOUTH DAILY 45 tablet 0   mometasone-formoterol (DULERA)  100-5 MCG/ACT AERO Inhale 2 puffs into the lungs in the morning and at bedtime. 1 each 11   SYRINGE-NEEDLE, DISP, 3 ML (BD SAFETYGLIDE SYRINGE/NEEDLE) 25G X 1" 3 ML MISC Use for B12 injections 100 each 11   ZEPBOUND 2.5 MG/0.5ML injection vial INJECT 2.5 MG SUBCUTANEOUSLY WEEKLY 2 mL 0   albuterol (VENTOLIN HFA) 108 (90 Base) MCG/ACT inhaler Inhale 2 puffs into the lungs every 6 (  six) hours as needed for wheezing or shortness of breath. 8 g 2   nitroGLYCERIN (NITROSTAT) 0.4 MG SL tablet Place 1 tablet (0.4 mg total) under the tongue every 5 (five) minutes as needed for chest pain. Up to 3 times. 90 tablet 3   No facility-administered medications prior to visit.     Review of Systems:   Constitutional: No weight loss or gain, night sweats, fevers, chills, lassitude, fatigue  HEENT: No difficulty swallowing, tooth/dental problems. No sneezing, itching, ear ache, headaches, nasal congestion, post nasal drip, sore throat  CV:  No chest pain, orthopnea, PND, swelling in lower extremities, anasarca, dizziness, palpitations, syncope Resp: +minimal dry cough. No shortness of breath with exertion. No hemoptysis. No wheezing.  No chest wall deformity GI:  No heartburn, indigestion GU: No dysuria, change in color of urine, urgency or frequency.   Skin: No rash, lesions, ulcerations MSK:  No joint pain or swelling.   Neuro: No dizziness or lightheadedness.  Psych: No depression or anxiety. Mood stable.     Physical Exam:  BP 116/82 (BP Location: Right Arm, Patient Position: Sitting, Cuff Size: Large)   Pulse 68   Ht 5\' 7"  (1.702 m)   Wt 251 lb (113.9 kg)   SpO2 98%   BMI 39.31 kg/m   GEN: Pleasant, interactive, well-appearing; obese; in no acute distress. HEENT:  Normocephalic and atraumatic. PERRLA. Sclera white. EACs patent bilaterally. TM pearly gray with present light reflex right b/l. Nasal turbinates pink, moist and patent bilaterally. No rhinorrhea present. Oropharynx pink and moist,  without exudate or edema. No lesions, ulcerations. No sinus tenderness.  NECK:  Supple w/ fair ROM. No JVD present. Normal carotid impulses w/o bruits. Thyroid symmetrical with no goiter or nodules palpated. No lymphadenopathy.   CV: RRR, no m/r/g, no peripheral edema. Pulses intact, +2 bilaterally. No cyanosis, pallor or clubbing. PULMONARY:  Unlabored, regular breathing. Clear bilaterally A&P w/o wheezes/rales/rhonchi. No accessory muscle use.  GI: BS present and normoactive. Soft, non-tender to palpation. No organomegaly or masses detected.  MSK: No erythema, warmth or tenderness. Cap refil <2 sec all extrem. No deformities or joint swelling noted.  Neuro: A/Ox3. No focal deficits noted.   Skin: Warm, no lesions or rashe Psych: Normal affect and behavior. Judgement and thought content appropriate.     Lab Results:  CBC    Component Value Date/Time   WBC 15.1 (H) 12/28/2022 1147   RBC 5.25 (H) 12/28/2022 1147   HGB 13.6 12/28/2022 1147   HGB 12.8 07/11/2021 0812   HCT 43.6 12/28/2022 1147   HCT 39.2 07/11/2021 0812   PLT 415 (H) 12/28/2022 1147   PLT 360 07/11/2021 0812   MCV 83.0 12/28/2022 1147   MCV 82 07/11/2021 0812   MCH 25.9 (L) 12/28/2022 1147   MCHC 31.2 12/28/2022 1147   RDW 15.6 (H) 12/28/2022 1147   RDW 14.0 07/11/2021 0812   LYMPHSABS 1.9 01/17/2022 0821   LYMPHSABS 2.1 07/11/2021 0812   MONOABS 0.5 01/17/2022 0821   EOSABS 0.1 01/17/2022 0821   EOSABS 0.2 07/11/2021 0812   BASOSABS 0.0 01/17/2022 0821   BASOSABS 0.1 07/11/2021 0812    BMET    Component Value Date/Time   NA 137 12/28/2022 1147   NA 139 07/19/2021 0815   K 3.3 (L) 12/28/2022 1147   CL 98 12/28/2022 1147   CO2 29 12/28/2022 1147   GLUCOSE 95 12/28/2022 1147   BUN 24 (H) 12/28/2022 1147   BUN 15 07/19/2021 0815  CREATININE 0.92 12/28/2022 1147   CALCIUM 9.1 12/28/2022 1147   GFRNONAA >60 12/28/2022 1147   GFRAA >60 12/26/2017 1038    BNP    Component Value Date/Time   BNP 22.3  12/28/2022 1214     Imaging:  No results found.  Administration History     None          Latest Ref Rng & Units 12/03/2022   10:18 AM  PFT Results  FVC-Pre L 2.89   FVC-Predicted Pre % 89   FVC-Post L 2.87   FVC-Predicted Post % 88   Pre FEV1/FVC % % 85   Post FEV1/FCV % % 88   FEV1-Pre L 2.45   FEV1-Predicted Pre % 100   FEV1-Post L 2.52   DLCO uncorrected ml/min/mmHg 22.06   DLCO UNC% % 108   DLCO corrected ml/min/mmHg 22.06   DLCO COR %Predicted % 108   DLVA Predicted % 111   TLC L 5.53   TLC % Predicted % 106   RV % Predicted % 104     Lab Results  Component Value Date   NITRICOXIDE 14 01/08/2023        Assessment & Plan:   Mild reactive airways disease Constellation of symptoms, evidence of air trapping and positive response to bronchodilator and steroids, consistent with small airways disease and raises suspicion for underlying asthma diagnosis. No formal obstruction on previous PFTs. She had mild exacerbation secondary to sinusitis/possible URI which has resolved with prednisone burst. She never started ICS/LABA due to cost. FeNO slightly elevated today, consistent with type II inflammation. Will switch her albuterol rescue to budesonide/albuterol Paulene Floor) PRN. Side effect profile reviewed. If she is utilizing this regularly or has another exacerbation, need to reconsider scheduled bronchodilator regimen.  Action plan in place. Trigger prevention.  Patient Instructions  Continue guaifenesin [phone] mg Twice daily for congestion Continue zyrtec 1 tab daily    -Switch albuterol inhaler to Airsupra 2 puffs every 6 hours as needed for shortness of breath or wheezing. Notify if symptoms persist despite rescue inhaler/neb use. . Brush tongue and rinse mouth afterwards.  -Continue saline nasal rinses 1-2 times a day with bottled, distilled water. Follow 30 minutes later with flonase nasal spray 2 sprays each nostril daily    Continue CPAP nightly    Follow up in 4 months with Dr. Vassie Loll or Katie Orey Moure,NP. If symptoms do not improve or worsen, please contact office for sooner follow up or seek emergency care   Sinusitis Resolved following empiric levaquin course. Continue sinus rinses and sprays. May consider referral to ENT if symptoms return/worsen  OSA on CPAP Excellent compliance and control. Continue nightly use. Receives benefit. Safe driving practices reviewed.      I spent 35 minutes of dedicated to the care of this patient on the date of this encounter to include pre-visit review of records, face-to-face time with the patient discussing conditions above, post visit ordering of testing, clinical documentation with the electronic health record, making appropriate referrals as documented, and communicating necessary findings to members of the patients care team.  Noemi Chapel, NP 05/27/2023  Pt aware and understands NP's role.

## 2023-05-27 NOTE — Assessment & Plan Note (Signed)
 Resolved following empiric levaquin  course. Continue sinus rinses and sprays. May consider referral to ENT if symptoms return/worsen

## 2023-05-27 NOTE — Assessment & Plan Note (Signed)
 Excellent compliance and control. Continue nightly use. Receives benefit. Safe driving practices reviewed.

## 2023-05-27 NOTE — Assessment & Plan Note (Signed)
 Constellation of symptoms, evidence of air trapping and positive response to bronchodilator and steroids, consistent with small airways disease and raises suspicion for underlying asthma diagnosis. No formal obstruction on previous PFTs. She had mild exacerbation secondary to sinusitis/possible URI which has resolved with prednisone  burst. She never started ICS/LABA due to cost. FeNO slightly elevated today, consistent with type II inflammation. Will switch her albuterol  rescue to budesonide /albuterol  (Airsupra ) PRN. Side effect profile reviewed. If she is utilizing this regularly or has another exacerbation, need to reconsider scheduled bronchodilator regimen.  Action plan in place. Trigger prevention.  Patient Instructions  Continue guaifenesin [phone] mg Twice daily for congestion Continue zyrtec 1 tab daily    -Switch albuterol  inhaler to Airsupra  2 puffs every 6 hours as needed for shortness of breath or wheezing. Notify if symptoms persist despite rescue inhaler/neb use. . Brush tongue and rinse mouth afterwards.  -Continue saline nasal rinses 1-2 times a day with bottled, distilled water . Follow 30 minutes later with flonase  nasal spray 2 sprays each nostril daily    Continue CPAP nightly   Follow up in 4 months with Dr. Villa Greaser or Gina Lagos. If symptoms do not improve or worsen, please contact office for sooner follow up or seek emergency care

## 2023-05-27 NOTE — Patient Instructions (Signed)
 Continue guaifenesin [phone] mg Twice daily for congestion Continue zyrtec 1 tab daily    -Switch albuterol  inhaler to Airsupra  2 puffs every 6 hours as needed for shortness of breath or wheezing. Notify if symptoms persist despite rescue inhaler/neb use. . Brush tongue and rinse mouth afterwards.  -Continue saline nasal rinses 1-2 times a day with bottled, distilled water . Follow 30 minutes later with flonase  nasal spray 2 sprays each nostril daily    Continue CPAP nightly   Follow up in 4 months with Dr. Villa Greaser or Gina Lagos. If symptoms do not improve or worsen, please contact office for sooner follow up or seek emergency care

## 2023-05-30 ENCOUNTER — Ambulatory Visit: Payer: PRIVATE HEALTH INSURANCE | Admitting: Nurse Practitioner

## 2023-06-03 ENCOUNTER — Other Ambulatory Visit: Payer: Self-pay | Admitting: Nurse Practitioner

## 2023-06-03 DIAGNOSIS — J019 Acute sinusitis, unspecified: Secondary | ICD-10-CM

## 2023-06-03 DIAGNOSIS — H6692 Otitis media, unspecified, left ear: Secondary | ICD-10-CM

## 2023-06-18 ENCOUNTER — Ambulatory Visit (INDEPENDENT_AMBULATORY_CARE_PROVIDER_SITE_OTHER): Payer: No Typology Code available for payment source | Admitting: Family Medicine

## 2023-06-18 ENCOUNTER — Encounter: Payer: Self-pay | Admitting: Family Medicine

## 2023-06-18 DIAGNOSIS — E2839 Other primary ovarian failure: Secondary | ICD-10-CM | POA: Diagnosis not present

## 2023-06-18 DIAGNOSIS — Z0000 Encounter for general adult medical examination without abnormal findings: Secondary | ICD-10-CM

## 2023-06-18 NOTE — Patient Instructions (Signed)
 I really enjoyed getting to talk with you today! I am available on Tuesdays and Thursdays for virtual visits if you have any questions or concerns, or if I can be of any further assistance.   CHECKLIST FROM ANNUAL WELLNESS VISIT:  -Follow up (please call to schedule if not scheduled after visit):   -yearly for annual wellness visit with primary care office  Here is a list of your preventive care/health maintenance measures and the plan for each if any are due:  PLAN For any measures below that may be due:  -call to schedule your colonoscopy, please let us know if you need any assistance with this -sent order for the bone density test, call out office if not contacted in the next few weeks -check on shingles vaccine with your pharmacy   Health Maintenance  Topic Date Due   Hepatitis C Screening  Never done   Colonoscopy  02/23/2018   DEXA SCAN  Never done   COVID-19 Vaccine (4 - 2024-25 season) 12/16/2022   Zoster Vaccines- Shingrix (2 of 2) 07/17/2023 (Originally 03/14/2021)   DTaP/Tdap/Td (3 - Td or Tdap) 06/13/2024   Medicare Annual Wellness (AWV)  06/17/2024   MAMMOGRAM  08/01/2024   Pneumonia Vaccine 71+ Years old  Completed   INFLUENZA VACCINE  Completed   HPV VACCINES  Aged Out    -See a dentist at least yearly  -Get your eyes checked and then per your eye specialist's recommendations  -Other issues addressed today:   -I have included below further information regarding a healthy whole foods based diet, physical activity guidelines for adults, stress management and opportunities for social connections. I hope you find this information useful.   -----------------------------------------------------------------------------------------------------------------------------------------------------------------------------------------------------------------------------------------------------------    NUTRITION: -eat real food: lots of colorful vegetables (half the plate)  and fruits -5-7 servings of vegetables and fruits per day (fresh or steamed is best), exp. 2 servings of vegetables with lunch and dinner and 2 servings of fruit per day. Berries and greens such as kale and collards are great choices.  -consume on a regular basis:  fresh fruits, fresh veggies, fish, nuts, seeds, healthy oils (such as olive oil, avocado oil), whole grains (make sure for bread/pasta/crackers/etc., that the first ingredient on label contains the word "whole"), legumes. -can eat small amounts of dairy and lean meat (no larger than the palm of your hand), but avoid processed meats such as ham, bacon, lunch meat, etc. -drink water -try to avoid fast food and pre-packaged foods, processed meat, ultra processed foods/beverages (donuts, candy, etc.) -most experts advise limiting sodium to < 2300mg  per day, should limit further is any chronic conditions such as high blood pressure, heart disease, diabetes, etc. The American Heart Association advised that < 1500mg  is is ideal -try to avoid foods/beverages that contain any ingredients with names you do not recognize  -try to avoid foods/beverages  with added sugar or sweeteners/sweets  -try to avoid sweet drinks (including diet drinks): soda, juice, Gatorade, sweet tea, power drinks, diet drinks -try to avoid white rice, white bread, pasta (unless whole grain)  EXERCISE GUIDELINES FOR ADULTS: -if you wish to increase your physical activity, do so gradually and with the approval of your doctor -STOP and seek medical care immediately if you have any chest pain, chest discomfort or trouble breathing when starting or increasing exercise  -move and stretch your body, legs, feet and arms when sitting for long periods -Physical activity guidelines for optimal health in adults: -get at least 150 minutes per week  of moderate exercise (can talk, but not sing); this is about 20-30 minutes of sustained activity 5-7 days per week or two 10-15 minute  episodes of sustained activity 5-7 days per week -do some muscle building/resistance training/strength training at least 2 days per week  -balance exercises 3+ days per week:   Stand somewhere where you have something sturdy to hold onto if you lose balance    1) lift up on toes, then back down, start with 5x per day and work up to 20x   2) stand and lift one leg straight out to the side so that foot is a few inches of the floor, start with 5x each side and work up to 20x each side   3) stand on one foot, start with 5 seconds each side and work up to 20 seconds on each side  If you need ideas or help with getting more active:  -Silver sneakers https://tools.silversneakers.com  -Walk with a Doc: http://www.duncan-[email or web address]/  -try to include resistance (weight lifting/strength building) and balance exercises twice per week: or the following link for ideas: http://castillo-powell.com/  BuyDucts.dk  STRESS MANAGEMENT: -can try meditating, or just sitting quietly with deep breathing while intentionally relaxing all parts of your body for 5 minutes daily -if you need further help with stress, anxiety or depression please follow up with your primary doctor or contact the wonderful folks at [facility]: [phone]  SOCIAL CONNECTIONS: -options in [place] if you wish to engage in more social and exercise related activities:  -Silver sneakers https://tools.silversneakers.com  -Walk with a Doc: http://www.duncan-[email or web address]/  -Check out the [facility] Active Adults 50+ section on the [place] of [employer] (hiking clubs, book clubs, cards and games, chess, exercise classes, aquatic classes and much more) - see the website for details: https://www.[place].gov/departments/parks-recreation/active-adults50  -YouTube has lots of exercise videos for different ages and abilities as  well  -[facility] (a variety of indoor and outdoor inperson activities for adults). [phone]. [address]  -Virtual Online Classes (a variety of topics): see seniorplanet.org or call [phone]  -consider volunteering at a school, hospice center, church, senior center or elsewhere    ADVANCED HEALTHCARE DIRECTIVES:  [place] Advanced Directives assistance:   ExpressWeek.com.cy  Everyone should have advanced health care directives in place. This is so that you get the care you want, should you ever be in a situation where you are unable to make your own medical decisions.   From the [place] Advanced Directive Website: "Advance Health Care Directives are legal documents in which you give written instructions about your health care if, in the future, you cannot speak for yourself.   A health care power of attorney allows you to name a person you trust to make your health care decisions if you cannot make them yourself. A declaration of a desire for a natural death (or living will) is document, which states that you desire not to have your life prolonged by extraordinary measures if you have a terminal or incurable illness or if you are in a vegetative state. An advance instruction for mental health treatment makes a declaration of instructions, information and preferences regarding your mental health treatment. It also states that you are aware that the advance instruction authorizes a mental health treatment provider to act according to your wishes. It may also outline your consent or refusal of mental health treatment. A declaration of an anatomical gift allows anyone over the age of 51 to make a gift by will, organ donor  card or other document."   Please see the following website or an elder law attorney for forms, FAQs and for completion of advanced directives: [place] TEFL teacher Health  Care Directives Advance Health Care Directives (http://guzman.com/)  Or copy and paste the following to your web browser: PoshChat.fi
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
## 2023-06-18 NOTE — Progress Notes (Signed)
 Patient unable to obtain vital signs due to telehealth visit

## 2023-06-25 ENCOUNTER — Encounter: Payer: PRIVATE HEALTH INSURANCE | Admitting: Internal Medicine

## 2023-06-25 DIAGNOSIS — E559 Vitamin D deficiency, unspecified: Secondary | ICD-10-CM

## 2023-06-25 DIAGNOSIS — I10 Essential (primary) hypertension: Secondary | ICD-10-CM

## 2023-06-25 DIAGNOSIS — E039 Hypothyroidism, unspecified: Secondary | ICD-10-CM

## 2023-06-25 DIAGNOSIS — R7302 Impaired glucose tolerance (oral): Secondary | ICD-10-CM

## 2023-06-25 DIAGNOSIS — E782 Mixed hyperlipidemia: Secondary | ICD-10-CM

## 2023-06-25 DIAGNOSIS — E538 Deficiency of other specified B group vitamins: Secondary | ICD-10-CM

## 2023-07-08 ENCOUNTER — Ambulatory Visit (INDEPENDENT_AMBULATORY_CARE_PROVIDER_SITE_OTHER): Admitting: Internal Medicine

## 2023-07-08 ENCOUNTER — Encounter: Payer: Self-pay | Admitting: Internal Medicine

## 2023-07-08 VITALS — BP 110/74 | HR 64 | Temp 97.7°F | Ht 66.5 in | Wt 253.1 lb

## 2023-07-08 DIAGNOSIS — R5383 Other fatigue: Secondary | ICD-10-CM

## 2023-07-08 DIAGNOSIS — I10 Essential (primary) hypertension: Secondary | ICD-10-CM | POA: Diagnosis not present

## 2023-07-08 DIAGNOSIS — E782 Mixed hyperlipidemia: Secondary | ICD-10-CM | POA: Diagnosis not present

## 2023-07-08 DIAGNOSIS — Z0000 Encounter for general adult medical examination without abnormal findings: Secondary | ICD-10-CM

## 2023-07-08 DIAGNOSIS — Z1159 Encounter for screening for other viral diseases: Secondary | ICD-10-CM | POA: Diagnosis not present

## 2023-07-08 DIAGNOSIS — E039 Hypothyroidism, unspecified: Secondary | ICD-10-CM | POA: Diagnosis not present

## 2023-07-08 DIAGNOSIS — R7302 Impaired glucose tolerance (oral): Secondary | ICD-10-CM | POA: Diagnosis not present

## 2023-07-08 DIAGNOSIS — Z1211 Encounter for screening for malignant neoplasm of colon: Secondary | ICD-10-CM

## 2023-07-08 DIAGNOSIS — E559 Vitamin D deficiency, unspecified: Secondary | ICD-10-CM

## 2023-07-08 DIAGNOSIS — Z124 Encounter for screening for malignant neoplasm of cervix: Secondary | ICD-10-CM

## 2023-07-08 DIAGNOSIS — Z1231 Encounter for screening mammogram for malignant neoplasm of breast: Secondary | ICD-10-CM

## 2023-07-08 DIAGNOSIS — E2839 Other primary ovarian failure: Secondary | ICD-10-CM | POA: Diagnosis not present

## 2023-07-08 DIAGNOSIS — E538 Deficiency of other specified B group vitamins: Secondary | ICD-10-CM | POA: Diagnosis not present

## 2023-07-08 LAB — LIPID PANEL
Cholesterol: 156 mg/dL (ref 0–200)
HDL: 53.1 mg/dL (ref >39.00)
LDL Cholesterol: 65 mg/dL (ref 0–99)
NonHDL: 102.74
Total CHOL/HDL Ratio: 3
Triglycerides: 189 mg/dL — ABNORMAL HIGH (ref 0.0–149.0)
VLDL: 37.8 mg/dL (ref 0.0–40.0)

## 2023-07-08 LAB — CBC WITH DIFFERENTIAL/PLATELET
Basophils Absolute: 0 10*3/uL (ref 0.0–0.1)
Basophils Relative: 0.4 % (ref 0.0–3.0)
Eosinophils Absolute: 0.1 10*3/uL (ref 0.0–0.7)
Eosinophils Relative: 1 % (ref 0.0–5.0)
HCT: 38.7 % (ref 36.0–46.0)
Hemoglobin: 12.9 g/dL (ref 12.0–15.0)
Lymphocytes Relative: 28.2 % (ref 12.0–46.0)
Lymphs Abs: 2.1 10*3/uL (ref 0.7–4.0)
MCHC: 33.2 g/dL (ref 30.0–36.0)
MCV: 80.7 fl (ref 78.0–100.0)
Monocytes Absolute: 0.5 10*3/uL (ref 0.1–1.0)
Monocytes Relative: 6.3 % (ref 3.0–12.0)
Neutro Abs: 4.7 10*3/uL (ref 1.4–7.7)
Neutrophils Relative %: 64.1 % (ref 43.0–77.0)
Platelets: 365 10*3/uL (ref 150.0–400.0)
RBC: 4.8 Mil/uL (ref 3.87–5.11)
RDW: 15.6 % — ABNORMAL HIGH (ref 11.5–15.5)
WBC: 7.3 10*3/uL (ref 4.0–10.5)

## 2023-07-08 LAB — COMPREHENSIVE METABOLIC PANEL
ALT: 16 U/L (ref 0–35)
AST: 17 U/L (ref 0–37)
Albumin: 4.3 g/dL (ref 3.5–5.2)
Alkaline Phosphatase: 69 U/L (ref 39–117)
BUN: 17 mg/dL (ref 6–23)
CO2: 30 meq/L (ref 19–32)
Calcium: 10.4 mg/dL (ref 8.4–10.5)
Chloride: 100 meq/L (ref 96–112)
Creatinine, Ser: 0.79 mg/dL (ref 0.40–1.20)
GFR: 76.8 mL/min (ref >60.00)
Glucose, Bld: 114 mg/dL — ABNORMAL HIGH (ref 70–99)
Potassium: 4.3 meq/L (ref 3.5–5.1)
Sodium: 139 meq/L (ref 135–145)
Total Bilirubin: 0.4 mg/dL (ref 0.2–1.2)
Total Protein: 7.4 g/dL (ref 6.0–8.3)

## 2023-07-08 LAB — TSH: TSH: 4.03 u[IU]/mL (ref 0.35–5.50)

## 2023-07-08 LAB — VITAMIN D 25 HYDROXY (VIT D DEFICIENCY, FRACTURES): VITD: 32.31 ng/mL (ref 30.00–100.00)

## 2023-07-08 LAB — VITAMIN B12: Vitamin B-12: 350 pg/mL (ref 211–911)

## 2023-07-08 LAB — HEMOGLOBIN A1C: Hgb A1c MFr Bld: 6.5 % (ref 4.6–6.5)

## 2023-07-08 NOTE — Progress Notes (Signed)
 Established Patient Office Visit     CC/Reason for Visit: Annual preventive exam  HPI: Erika [name] is a 68 y.o. female who is coming in today for the above mentioned reasons. Past Medical History is significant for: Hypertension, hyperlipidemia, obesity, OSA, hypothyroidism, vitamin D and B12 deficiencies, as well, coronary artery disease.  Has been feeling well.  Has routine eye care, overdue for dental exam.  Is due for shingles vaccination.  She is due for all age-appropriate cancer screening and a bone density.   Past Medical/Surgical History: Past Medical History:  Diagnosis Date   Anxiety    Arthritis    Bell's palsy    COPD (chronic obstructive pulmonary disease) (HCC)    History of cardiac murmur as a child    Hyperlipidemia    Hypertension    Menopausal syndrome    Mild asthma    no inhalers since stopped smoking   Myocardial infarction (HCC)    2020--pt states no damage   Neuroma of foot    OSA on CPAP    Wears glasses     Past Surgical History:  Procedure Laterality Date   CORONARY IMAGING/OCT N/A 07/14/2021   Procedure: INTRAVASCULAR IMAGING/OCT;  Surgeon: Orbie Pyo, MD;  [facility];  Service: Cardiovascular;  Laterality: N/A;   CORONARY PRESSURE/FFR STUDY N/A 07/14/2021   Procedure: INTRAVASCULAR PRESSURE WIRE/FFR STUDY;  Surgeon: Orbie Pyo, MD;  [facility];  Service: Cardiovascular;  Laterality: N/A;   CORONARY STENT INTERVENTION N/A 07/14/2021   Procedure: CORONARY STENT INTERVENTION;  Surgeon: Orbie Pyo, MD;  [facility];  Service: Cardiovascular;  Laterality: N/A;   ELBOW SURGERY Right 2002   repair tendon and nerve injury   EXCISION MORTON'S NEUROMA Left 2010   LEFT HEART CATH AND CORONARY ANGIOGRAPHY N/A 07/14/2021   Procedure: LEFT HEART CATH AND CORONARY ANGIOGRAPHY;  Surgeon: Orbie Pyo, MD;  [facility];  Service: Cardiovascular;  Laterality: N/A;    ROBOTIC ASSISTED TOTAL HYSTERECTOMY WITH BILATERAL SALPINGO OOPHERECTOMY N/A 12/31/2017   Procedure: XI ROBOTIC ASSISTED TOTAL HYSTERECTOMY WITH BILATERAL SALPINGO OOPHORECTOMY;  Surgeon: Adolphus Birchwood, MD;  Location: [facility];  Service: Gynecology;  Laterality: N/A;   SHOULDER ARTHROSCOPY W/ ROTATOR CUFF REPAIR Left 09/2016   and burectomy    Social History:  reports that she quit smoking about 23 years ago. Her smoking use included cigarettes. She started smoking about 53 years ago. She has a 30 pack-year smoking history. She has never used smokeless tobacco. She reports that she does not drink alcohol and does not use drugs.  Allergies: Allergies  Allergen Reactions   Tape Other (See Comments)    PATIENT PREFERS CLOTH TAPE- THE OTHERS DON'T AGREE WITH HER SKIN   Tetracycline Hcl Rash    Family History:  Family History  Problem Relation Age of Onset   Depression Mother    Diabetes Mother    Heart disease Mother    Asthma Sister    Cancer Maternal Aunt    Cancer Maternal Uncle    Uterine cancer Maternal Uncle    Breast cancer Paternal Aunt    Diabetes Maternal Grandmother    Diabetes Maternal Grandfather    Cancer Paternal Grandmother    Cancer Brother    Colon cancer Neg Hx    Colon polyps Neg Hx    Esophageal cancer Neg Hx    Rectal cancer Neg Hx    Stomach  cancer Neg Hx      Current Outpatient Medications:    acetaminophen (TYLENOL) 650 MG CR tablet, Take 650-1,300 mg by mouth every 8 (eight) hours as needed for pain., Disp: , Rfl:    Albuterol-Budesonide (AIRSUPRA) 90-80 MCG/ACT AERO, Inhale 1-2 puffs into the lungs every 6 (six) hours as needed (shortness of breath, wheezing)., Disp: 10.7 g, Rfl: 2   atorvastatin (LIPITOR) 40 MG tablet, TAKE 1 TABLET BY MOUTH EVERY DAY, Disp: 90 tablet, Rfl: 1   buPROPion (WELLBUTRIN XL) 150 MG 24 hr tablet, TAKE 1 TABLET (150 MG) BY MOUTH DAILY, Disp: 90 tablet, Rfl: 0   Cholecalciferol (VITAMIN D3 SUPER STRENGTH) 50 MCG (2000 UT) CAPS,  Take 2,000 Units by mouth every evening., Disp: , Rfl:    citalopram (CELEXA) 40 MG tablet, TAKE 1 TABLET BY MOUTH EVERYDAY AT BEDTIME, Disp: 90 tablet, Rfl: 1   clopidogrel (PLAVIX) 75 MG tablet, TAKE 1 TABLET (75 MG) BY MOUTH DAILY, Disp: 90 tablet, Rfl: 1   cyanocobalamin (VITAMIN B12) 1000 MCG/ML injection, INJECT 1 ML (1,000 MCG TOTAL) INTO THE MUSCLE once a week for 1 month.  Then inject 1 ml once a month thereafter., Disp: 6 mL, Rfl: 11   fluticasone (FLONASE) 50 MCG/ACT nasal spray, SPRAY 2 SPRAYS INTO EACH NOSTRIL EVERY DAY, Disp: 48 mL, Rfl: 1   hydrochlorothiazide (HYDRODIURIL) 25 MG tablet, TAKE 1 TABLET(25 MG) BY MOUTH EVERY EVENING, Disp: 90 tablet, Rfl: 1   hydrOXYzine (VISTARIL) 25 MG capsule, TAKE 1 CAPSULE (25 MG TOTAL) BY MOUTH AT BEDTIME AND MAY REPEAT DOSE ONE TIME IF NEEDED., Disp: 180 capsule, Rfl: 1   levothyroxine (SYNTHROID) 75 MCG tablet, TAKE 1 TABLET BY MOUTH DAILY BEFORE BREAKFAST., Disp: 90 tablet, Rfl: 1   metoprolol succinate (TOPROL-XL) 25 MG 24 hr tablet, TAKE 1/2 TABLET BY MOUTH DAILY, Disp: 45 tablet, Rfl: 0   nitroGLYCERIN (NITROSTAT) 0.4 MG SL tablet, Place 1 tablet (0.4 mg total) under the tongue every 5 (five) minutes as needed for chest pain. Up to 3 times., Disp: 90 tablet, Rfl: 3   SYRINGE-NEEDLE, DISP, 3 ML (BD SAFETYGLIDE SYRINGE/NEEDLE) 25G X 1" 3 ML MISC, Use for B12 injections, Disp: 100 each, Rfl: 11   ZEPBOUND 2.5 MG/0.5ML injection vial, INJECT 2.5 MG SUBCUTANEOUSLY WEEKLY, Disp: 2 mL, Rfl: 0   diphenhydramine-acetaminophen (TYLENOL PM) 25-500 MG TABS tablet, Take 1 tablet by mouth at bedtime as needed (sleep). (Patient not taking: Reported on 07/08/2023), Disp: , Rfl:   Review of Systems:  Negative unless indicated in HPI.   Physical Exam: Vitals:   07/08/23 0752  BP: 110/74  Pulse: 64  Temp: 97.7 F (36.5 C)  TempSrc: Oral  SpO2: 98%  Weight: 253 lb 1.6 oz (114.8 kg)  Height: 5' 6.5" (1.689 m)    Body mass index is 40.24  kg/m.   Physical Exam Vitals reviewed.  Constitutional:      General: She is not in acute distress.    Appearance: Normal appearance. She is obese. She is not ill-appearing, toxic-appearing or diaphoretic.  HENT:     Head: Normocephalic.     Right Ear: Tympanic membrane, ear canal and external ear normal. There is no impacted cerumen.     Left Ear: Tympanic membrane, ear canal and external ear normal. There is no impacted cerumen.     Nose: Nose normal.     Mouth/Throat:     Mouth: Mucous membranes are moist.     Pharynx: Oropharynx is clear. No oropharyngeal exudate  or posterior oropharyngeal erythema.  Eyes:     General: No scleral icterus.       Right eye: No discharge.        Left eye: No discharge.     Conjunctiva/sclera: Conjunctivae normal.     Pupils: Pupils are equal, round, and reactive to light.  Neck:     Vascular: No carotid bruit.  Cardiovascular:     Rate and Rhythm: Normal rate and regular rhythm.     Pulses: Normal pulses.     Heart sounds: Normal heart sounds.  Pulmonary:     Effort: Pulmonary effort is normal. No respiratory distress.     Breath sounds: Normal breath sounds.  Abdominal:     General: Abdomen is flat. Bowel sounds are normal.     Palpations: Abdomen is soft.  Musculoskeletal:        General: Normal range of motion.     Cervical back: Normal range of motion.  Skin:    General: Skin is warm and dry.  Neurological:     General: No focal deficit present.     Mental Status: She is alert and oriented to person, place, and time. Mental status is at baseline.  Psychiatric:        Mood and Affect: Mood normal.        Behavior: Behavior normal.        Thought Content: Thought content normal.        Judgment: Judgment normal.     Flowsheet Row Office Visit from 07/08/2023 in [facility] [place]  PHQ-9 Total Score 13        Impression and Plan:  Estrogen deficiency -     DG Bone Density; Future  Fatigue,  unspecified type -     TSH; Future -     Vitamin B12; Future  IGT (impaired glucose tolerance) -     Hemoglobin A1c; Future  Essential hypertension -     CBC with Differential/Platelet; Future -     Comprehensive metabolic panel; Future  B12 deficiency  Mixed hyperlipidemia -     Lipid panel; Future  Primary hypertension  Vitamin D deficiency -     VITAMIN D 25 Hydroxy (Vit-D Deficiency, Fractures); Future  Hypothyroidism, unspecified type  Encounter for preventive health examination  Encounter for hepatitis C screening test for low risk patient -     Hepatitis C antibody; Future   -Recommend routine eye and dental care. -Healthy lifestyle discussed in detail. -Labs to be updated today. -Prostate cancer screening: N/A Health Maintenance  Topic Date Due   Hepatitis C Screening  Never done   Colon Cancer Screening  02/23/2018   DEXA scan (bone density measurement)  Never done   COVID-19 Vaccine (4 - 2024-25 season) 12/16/2022   Zoster (Shingles) Vaccine (2 of 2) 07/17/2023*   DTaP/Tdap/Td vaccine (3 - Td or Tdap) 06/13/2024   Medicare Annual Wellness Visit  06/17/2024   Mammogram  08/01/2024   Pneumonia Vaccine  Completed   Flu Shot  Completed   HPV Vaccine  Aged Out  *Topic was postponed. The date shown is not the original due date.     -Shingles vaccine at local pharmacy. -Referrals to be placed for mammogram, DEXA, GI for colonoscopy and GYN for cervical cancer screening.    Chaya Jan, MD [facility]

## 2023-07-08 NOTE — Addendum Note (Signed)
 Addended by: [name] B on: 07/08/2023 08:23 AM   Modules accepted: Orders

## 2023-07-09 LAB — HEPATITIS C ANTIBODY: Hepatitis C Ab: NONREACTIVE

## 2023-07-10 ENCOUNTER — Encounter: Payer: Self-pay | Admitting: Internal Medicine

## 2023-07-18 ENCOUNTER — Other Ambulatory Visit: Payer: Self-pay | Admitting: Internal Medicine

## 2023-07-20 ENCOUNTER — Other Ambulatory Visit: Payer: Self-pay | Admitting: Internal Medicine

## 2023-07-20 DIAGNOSIS — J209 Acute bronchitis, unspecified: Secondary | ICD-10-CM

## 2023-08-02 DIAGNOSIS — Z955 Presence of coronary angioplasty implant and graft: Secondary | ICD-10-CM | POA: Diagnosis not present

## 2023-08-02 DIAGNOSIS — Z6839 Body mass index (BMI) 39.0-39.9, adult: Secondary | ICD-10-CM | POA: Diagnosis not present

## 2023-08-02 DIAGNOSIS — F4381 Prolonged grief disorder: Secondary | ICD-10-CM | POA: Diagnosis not present

## 2023-08-02 DIAGNOSIS — J45909 Unspecified asthma, uncomplicated: Secondary | ICD-10-CM | POA: Diagnosis not present

## 2023-08-02 DIAGNOSIS — F3341 Major depressive disorder, recurrent, in partial remission: Secondary | ICD-10-CM | POA: Diagnosis not present

## 2023-08-02 DIAGNOSIS — G4733 Obstructive sleep apnea (adult) (pediatric): Secondary | ICD-10-CM | POA: Diagnosis not present

## 2023-08-02 DIAGNOSIS — F17211 Nicotine dependence, cigarettes, in remission: Secondary | ICD-10-CM | POA: Diagnosis not present

## 2023-08-02 DIAGNOSIS — I10 Essential (primary) hypertension: Secondary | ICD-10-CM | POA: Diagnosis not present

## 2023-08-02 DIAGNOSIS — Z008 Encounter for other general examination: Secondary | ICD-10-CM | POA: Diagnosis not present

## 2023-08-02 DIAGNOSIS — E1169 Type 2 diabetes mellitus with other specified complication: Secondary | ICD-10-CM | POA: Diagnosis not present

## 2023-08-02 DIAGNOSIS — E785 Hyperlipidemia, unspecified: Secondary | ICD-10-CM | POA: Diagnosis not present

## 2023-08-02 DIAGNOSIS — I2510 Atherosclerotic heart disease of native coronary artery without angina pectoris: Secondary | ICD-10-CM | POA: Diagnosis not present

## 2023-08-02 DIAGNOSIS — E1159 Type 2 diabetes mellitus with other circulatory complications: Secondary | ICD-10-CM | POA: Diagnosis not present

## 2023-08-16 ENCOUNTER — Other Ambulatory Visit: Payer: Self-pay | Admitting: Internal Medicine

## 2023-08-26 ENCOUNTER — Other Ambulatory Visit (HOSPITAL_COMMUNITY)
Admission: RE | Admit: 2023-08-26 | Discharge: 2023-08-26 | Disposition: A | Discharge status: Home / Self Care | Source: Ambulatory Visit | Attending: Obstetrics and Gynecology | Admitting: Obstetrics and Gynecology

## 2023-08-26 ENCOUNTER — Ambulatory Visit (INDEPENDENT_AMBULATORY_CARE_PROVIDER_SITE_OTHER): Payer: Self-pay | Admitting: Obstetrics and Gynecology

## 2023-08-26 ENCOUNTER — Encounter: Payer: Self-pay | Admitting: Obstetrics and Gynecology

## 2023-08-26 VITALS — BP 104/62 | HR 66 | Temp 97.9°F | Ht 65.25 in | Wt 250.0 lb

## 2023-08-26 DIAGNOSIS — B372 Candidiasis of skin and nail: Secondary | ICD-10-CM | POA: Diagnosis not present

## 2023-08-26 DIAGNOSIS — Z124 Encounter for screening for malignant neoplasm of cervix: Secondary | ICD-10-CM | POA: Insufficient documentation

## 2023-08-26 DIAGNOSIS — Z01419 Encounter for gynecological examination (general) (routine) without abnormal findings: Secondary | ICD-10-CM | POA: Diagnosis not present

## 2023-08-26 DIAGNOSIS — Z1331 Encounter for screening for depression: Secondary | ICD-10-CM | POA: Diagnosis not present

## 2023-08-26 DIAGNOSIS — N644 Mastodynia: Secondary | ICD-10-CM | POA: Diagnosis not present

## 2023-08-26 MED ORDER — NYSTATIN 100000 UNIT/GM EX POWD: 1.0000 | Freq: Two times a day (BID) | CUTANEOUS | 1 refills | Status: AC
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
## 2023-08-26 NOTE — Progress Notes (Unsigned)
 Spoke w/ Destiny @ [facility] and got the pt scheduled for first available on 08/29/2023 @ 240pm. They just need the order co-signed when you get the time.  Pt notified and voiced understanding.

## 2023-08-26 NOTE — Patient Instructions (Signed)

## 2023-08-28 ENCOUNTER — Ambulatory Visit: Payer: Self-pay | Admitting: Obstetrics and Gynecology

## 2023-08-28 LAB — CYTOLOGY - PAP
Adequacy: ABSENT
Diagnosis: NEGATIVE

## 2023-08-29 ENCOUNTER — Ambulatory Visit
Admission: RE | Admit: 2023-08-29 | Discharge: 2023-08-29 | Disposition: A | Discharge status: Home / Self Care | Source: Ambulatory Visit | Attending: Obstetrics and Gynecology | Admitting: Obstetrics and Gynecology

## 2023-08-29 ENCOUNTER — Ambulatory Visit
Admission: RE | Admit: 2023-08-29 | Discharge: 2023-08-29 | Discharge status: Home / Self Care | Source: Ambulatory Visit | Attending: Obstetrics and Gynecology

## 2023-08-29 DIAGNOSIS — N644 Mastodynia: Secondary | ICD-10-CM | POA: Diagnosis not present

## 2023-08-30 NOTE — Assessment & Plan Note (Addendum)
 Cervical cancer screening performed according to Dr. Pearly Bound (GYN ONC) recommendations, can discontinue after 2017 Encouraged annual mammogram screening Colonoscopy due, aware to f/u with GI DXA: ordered by PCP, pt to schedule Labs and immunizations with her primary Encouraged safe sexual practices as indicated Encouraged healthy lifestyle practices with diet and exercise For patients under 50-70yo, I recommend 1200mg  calcium  daily and 600IU of vitamin D  daily.

## 2023-09-02 ENCOUNTER — Encounter: Payer: Self-pay | Admitting: Internal Medicine

## 2023-09-11 DIAGNOSIS — G4733 Obstructive sleep apnea (adult) (pediatric): Secondary | ICD-10-CM | POA: Diagnosis not present

## 2023-09-24 ENCOUNTER — Encounter: Payer: Self-pay | Admitting: Nurse Practitioner

## 2023-09-24 ENCOUNTER — Ambulatory Visit (INDEPENDENT_AMBULATORY_CARE_PROVIDER_SITE_OTHER): Payer: No Typology Code available for payment source | Admitting: Nurse Practitioner

## 2023-09-24 VITALS — BP 108/62 | HR 75 | Temp 97.7°F | Ht 66.5 in | Wt 246.4 lb

## 2023-09-24 DIAGNOSIS — Z87891 Personal history of nicotine dependence: Secondary | ICD-10-CM | POA: Diagnosis not present

## 2023-09-24 DIAGNOSIS — J329 Chronic sinusitis, unspecified: Secondary | ICD-10-CM | POA: Diagnosis not present

## 2023-09-24 DIAGNOSIS — J4530 Mild persistent asthma, uncomplicated: Secondary | ICD-10-CM | POA: Diagnosis not present

## 2023-09-24 DIAGNOSIS — G4733 Obstructive sleep apnea (adult) (pediatric): Secondary | ICD-10-CM

## 2023-09-24 DIAGNOSIS — J3089 Other allergic rhinitis: Secondary | ICD-10-CM

## 2023-09-24 DIAGNOSIS — Z9981 Dependence on supplemental oxygen: Secondary | ICD-10-CM

## 2023-09-24 MED ORDER — AIRSUPRA 90-80 MCG/ACT IN AERO
1.0000 | INHALATION_SPRAY | Freq: Four times a day (QID) | RESPIRATORY_TRACT | 2 refills | Status: AC | PRN
Start: 2023-09-24 — End: ?

## 2023-09-24 NOTE — Assessment & Plan Note (Signed)
 OSA on CPAP. Excellent compliance and control. Receives benefit from use. Aware of proper care/use of device. Safe driving practices reviewed.

## 2023-09-24 NOTE — Patient Instructions (Addendum)
 Continue guaifenesin [phone] mg Twice daily for congestion Continue zyrtec 1 tab daily    -Switch albuterol  inhaler to Airsupra  2 puffs every 6 hours as needed for shortness of breath or wheezing. Notify if symptoms persist despite rescue inhaler/neb use. . Brush tongue and rinse mouth afterwards.  -Continue saline nasal rinses 1-2 times a day with bottled, distilled water . Follow 30 minutes later with flonase  nasal spray 2 sprays each nostril daily   Make sure you're doing a daily non drowsy allergy pill, the saline rinses and the flonase  anytime you're going down to the beach as it seems there is a trigger there that flares up your symptoms    Continue CPAP nightly Make sure you're cleaning daily and changing the water  in the water  chamber out (distilled water )  Referral to allergist   Follow up in 6 months with Dr. Villa Greaser or Gina Lagos. If symptoms do not improve or worsen, please contact office for sooner follow up or seek emergency care

## 2023-09-24 NOTE — Assessment & Plan Note (Signed)
 Recurrent sinusitis. Seems to have environmental trigger. Prior blood allergen panel was negative. Has never had skin testing. Will refer her to allergist. Advised trigger prevention and supportive care.   Patient Instructions  Continue guaifenesin [phone] Twice daily for congestion Continue zyrtec 1 tab daily    -Switch albuterol  inhaler to Airsupra  2 puffs every 6 hours as needed for shortness of breath or wheezing. Notify if symptoms persist despite rescue inhaler/neb use. . Brush tongue and rinse mouth afterwards.  -Continue saline nasal rinses 1-2 times a day with bottled, distilled water . Follow 30 minutes later with flonase  nasal spray 2 sprays each nostril daily   Make sure you're doing a daily non drowsy allergy pill, the saline rinses and the flonase  anytime you're going down to the beach as it seems there is a trigger there that flares up your symptoms    Continue CPAP nightly Make sure you're cleaning daily and changing the water  in the water  chamber out (distilled water )  Referral to allergist   Follow up in 6 months with Dr. Villa Greaser or [place] Jerry Seth Friedlander,NP. If symptoms do not improve or worsen, please contact office for sooner follow up or seek emergency care

## 2023-09-24 NOTE — Assessment & Plan Note (Addendum)
 Hx of elevated exhaled nitric oxide  testing consistent with type II inflammation. Possible allergic component. Rx for Airsupra  resent for PRN use. Side effect profile reviewed. Continue trigger prevention. See above. Action plan in place

## 2023-09-24 NOTE — Progress Notes (Signed)
 @Patient  ID: Erika [name], female    DOB: Jul 01, 1955, 68 y.o.   MRN: [id number]  Chief Complaint  Patient presents with   Follow-up    Wearing CPAP-doing well    Referring provider: Zilphia Hilt, Est[place]*  HPI: 68 year old female, former smoker followed for dyspnea on exertion and OSA on CPAP. She is a patient of Dr. Hortense Lyons and last seen in office 05/27/2023 by [facility] NP. Past medical history significant for HTN, CHF, CAD, angina, vertigo, anxiety, depression, HLD.  TEST/EVENTS:  03/02/2021 echo: EF 60 to 65%.  LV diastolic parameters normal.  RV size and function normal.  Trivial MR. 07/14/2021 cardiac catheterization: EF 55 to 65%; LVEDP 22 mmHg 12/03/2022 PFT: FVC 89, FEV1 100, ratio 88, TLC 106, DLCOcor 108 12/28/2022 CTA chest: Negative for PE.  CAD/atherosclerosis.  Small hiatal hernia.  Mild mosaic attenuation in the upper lungs compatible with air trapping. 04/05/2023 CXR: clear  05/27/2023 FeNO 27 ppb  05/31/2022: OV with Dr. Villa Greaser. DOE and OSA. Hx of OSA dating back to 2004, AHI 22/h. Maintained on CPAP with good improvement in her daytime somnolence and fatigue. Lately if she feels like her breathing is worse at night and she cannot get enough air. Creates anxiety. Wishes she could do awake with her CPAP. Reports DOE for 1 year and gradually worsening. Worse on walking, climbing stairs or cleaning her house. Reports associated dry cough. Underwent cardiac evaluation including normal echo cardiac cath showed LVEDP of 22 but non significant CAD. CT chest unremarkable for 4 mm RUL. 1 year CT for follow up. No desaturations on walk test. Etiology for dyspnea not apparent. Cardiac workup was negative except for elevated LVEDP on cardiac cath. She was given lasix  without significant improvement. Does not appear fluid overloaded. There is no evidence of ILD or PH. PFTs ordered for further evaluation. DOE possibly due to deconditioning and weight gain.   01/08/2023: OV with Siennah Barrasso NP for follow  up after undergoing pulmonary function testing, which were normal aside from some air trapping. She has been having trouble with a bout of bronchitis over the past month. She went to Urgent Care 9/7 and was treated with steroids. She felt better with this but symptoms quickly returned once off steroids. She felt like her breathing worsened so she went to the ED on 9/13. Workup was unremarkable. She had normal lab testing and CT imaging without evidence of acute infectious process or PE. She was treated with z pack and prednisone  taper. She then went to her PCP on 9/17 and was still experiencing increased dyspnea. Note reviewed; exam reassuring with clear lung sounds. She was advised to finish z pack and prednisone . She was also given a breathing treatment in office and sent home with albuterol  and atarax .  Today, she tells me she is finally feeling better. Symptoms started to improve middle to end of last week. She's on her last day of prednisone . She has used the albuterol  a couple times this week. She does feel like the steroids and inhaler help her a lot. Her cough is mostly gone. Breathing feels better than her usual with being on the steroids. It's just keeping her up at night. She usually gets short winded with activity. Has trouble doing things around the house, which has been normal for her for quite some time. Feels like she gasps for air at times with activity and sometimes at night when she gets ready to lay down and go to sleep. She has never  been on a daily inhaler. No history of childhood asthma. She denies any current problems with wheezing, fevers, chills, leg swelling, CP. She does want to start working on losing weight and some sort of exercise program. Thinks this is a contributing factor as well. Has had normal cardiac workup thus far.  12/09/2022-01/07/2023: CPAP 8-15 cmH2O 28/30 days; >4 hr 93%, average use 7 hr 43 min Pressure 95th 12 Leaks 95th 27.4 AHI 1.4  04/25/2023: OV with Nadie Fiumara NP.  The patient, with a history of sinus problems, presented with a prolonged cough beginning mid December. She reported that the cough was productive, but the sputum was less thick than before. The patient also noted increased shortness of breath, which she attributed to the persistent coughing. Previously, she had been treated with Augmentin  and steroids following a visit to urgent care 12/20, where a chest x-ray was performed and found to be clear. She was told she had an ear infection at this visit. She did feel like the ear pain was improved but never entirely resolved. Since completing, she's started to have more pain in the left ear and both feel full. No drainage from either ear or hearing changes. She went to her PCP last week who recommended Zyrtec and Mucinex for thirty days to manage her symptoms. She was also started on benzonatate  as needed for cough. She reported a slight improvement since starting this regimen.  The patient was prescribed Symbicort  at our last visit, but due to the high cost, she never started this. She has been using albuterol  instead. She reported using the albuterol  about once a day since being sick. She found the albuterol  to be helpful. The patient also reported a history of similar symptoms in September and had experienced this condition four times in the past year. She is not currently using any sinus sprays, but was taking Zyrtec during allergy seasons. In addition to the sinus problems, the patient reported headaches, particularly worse in the mornings, and a feeling of pressure and congestion. Mucus is yellow. No facial tenderness. No fevers, chills, hemoptysis, wheezing. The patient's family members had also been sick around the same time, suggesting a possible viral origin for the symptoms.   05/27/2023: OV with Leticia Mcdiarmid NP for follow up. At our last visit, we treated her for recurrent AOM and sinusitis with levaquin . She improved while on abx then called in on 1/29 with  return of symptoms. She was advised to have in person evaluation. She tells me today that she ended up waiting and has felt better. Ear pain and pressure has resolved. Sinuses feel back to normal. Not having much congestion. Her cough is minimal now. She hasn't had to use her albuterol  in the past week or so. She denies fevers, chills, sore throat, shortness of breath or wheezing. She never got the Dulera  because of cost.  Wears CPAP nightly. Feels refreshed. No drowsy driving or sleep parasomnias/paralysis.  FeNO 27 ppb 04/24/2023-05/23/2023: CPAP 8-15 cmH2O 30/30 days; 100% >4 hr; average use 8 hr 47 min Pressure 95th 12.8 Leaks 95th 25.1 AHI 1.6  09/24/2023: Today - follow up Patient presents today for follow up. Doing well from a sleep perspective. Using CPAP nightly. Receives benefit from use. Energy levels are good. No issues with drowsy driving. She did feel better after our last visit. Cough had resolved. She then went to the beach to help open up her sister's beach house for the season. She has noticed that every time she goes down there,  she comes back with sinus symptoms and ends up with a sinus infection. She was having sinus congestion, facial pain, and left ear pain. Her PCP started her on Omnicef. She's feeling much better. Cough is resolving. Occasionally uses her albuterol ; didn't get the Airsupra  prescribed last time. Not taking a daily allergy pill regularly. Has never seen an allergist.   08/25/2023-09/23/2023: CPAP 8-15 cmH2O 27/30 days; 87% >4 hr; average use 8 hr  Pressure 95th 12.8 Leaks 95th 18.9 AHI 2  Allergies  Allergen Reactions   Tape Other (See Comments)    PATIENT PREFERS CLOTH TAPE- THE OTHERS DON'T AGREE WITH HER SKIN   Tetracycline Hcl Rash    Immunization History  Administered Date(s) Administered   Fluad Quad(high Dose 65+) 03/26/2020, 01/17/2022   Fluad Trivalent(High Dose 65+) 04/25/2023   Influenza Split 05/15/2011, 01/25/2014   Influenza Whole  01/06/2008, 02/03/2009   Influenza,inj,Quad PF,6+ Mos 02/05/2013, 01/29/2017   Influenza-Unspecified 01/14/2017, 01/27/2019   PFIZER(Purple Top)SARS-COV-2 Vaccination 07/17/2019, 08/11/2019, 03/04/2020   PNEUMOCOCCAL CONJUGATE-20 01/17/2022   Pneumococcal Conjugate-13 03/26/2020   Td 04/16/2004   Tdap 06/14/2014   Zoster Recombinant(Shingrix) 01/17/2021    Past Medical History:  Diagnosis Date   Anxiety    Arthritis    Bell's palsy    COPD (chronic obstructive pulmonary disease) (HCC)    History of cardiac murmur as a child    Hyperlipidemia    Hypertension    Menopausal syndrome    Mild asthma    no inhalers since stopped smoking   Myocardial infarction (HCC)    2020--pt states no damage   Neuroma of foot    OSA on CPAP    Wears glasses     Tobacco History: Social History   Tobacco Use  Smoking Status Former   Current packs/day: 0.00   Average packs/day: 1 pack/day for 30.0 years (30.0 ttl pk-yrs)   Types: Cigarettes   Start date: 06/01/1970   Quit date: 06/01/2000   Years since quitting: 23.3  Smokeless Tobacco Never   Counseling given: Not Answered   Outpatient Medications Prior to Visit  Medication Sig Dispense Refill   acetaminophen  (TYLENOL ) 650 MG CR tablet Take 650-1,300 mg by mouth every 8 (eight) hours as needed for pain.     atorvastatin  (LIPITOR) 40 MG tablet TAKE 1 TABLET BY MOUTH EVERY DAY 90 tablet 1   cefdinir (OMNICEF) 300 MG capsule Take 300 mg by mouth.     Cholecalciferol  (VITAMIN D3 SUPER STRENGTH) 50 MCG (2000 UT) CAPS Take 2,000 Units by mouth every evening.     citalopram  (CELEXA ) 40 MG tablet TAKE 1 TABLET BY MOUTH EVERYDAY AT BEDTIME 90 tablet 1   clopidogrel  (PLAVIX ) 75 MG tablet TAKE 1 TABLET (75 MG) BY MOUTH DAILY 90 tablet 1   cyanocobalamin  (VITAMIN B12) 1000 MCG/ML injection INJECT 1 ML (1,000 MCG TOTAL) INTO THE MUSCLE once a week for 1 month.  Then inject 1 ml once a month thereafter. 6 mL 11   diphenhydramine-acetaminophen   (TYLENOL  PM) 25-500 MG TABS tablet Take 1 tablet by mouth at bedtime as needed (sleep).     hydrochlorothiazide  (HYDRODIURIL ) 25 MG tablet TAKE 1 TABLET(25 MG) BY MOUTH EVERY EVENING 90 tablet 1   hydrOXYzine  (VISTARIL ) 25 MG capsule TAKE 1 CAPSULE (25 MG TOTAL) BY MOUTH AT BEDTIME AND MAY REPEAT DOSE ONE TIME IF NEEDED. 180 capsule 1   levothyroxine  (SYNTHROID ) 75 MCG tablet TAKE 1 TABLET BY MOUTH DAILY BEFORE BREAKFAST. 90 tablet 1   metoprolol  succinate (TOPROL -XL)  25 MG 24 hr tablet TAKE 1/2 TABLET BY MOUTH DAILY 45 tablet 1   Albuterol -Budesonide  (AIRSUPRA ) 90-80 MCG/ACT AERO Inhale 1-2 puffs into the lungs every 6 (six) hours as needed (shortness of breath, wheezing). 10.7 g 2   buPROPion  (WELLBUTRIN  XL) 150 MG 24 hr tablet TAKE 1 TABLET (150 MG) BY MOUTH DAILY 90 tablet 0   nitroGLYCERIN  (NITROSTAT ) 0.4 MG SL tablet Place 1 tablet (0.4 mg total) under the tongue every 5 (five) minutes as needed for chest pain. Up to 3 times. 90 tablet 3   SYRINGE-NEEDLE, DISP, 3 ML (BD SAFETYGLIDE SYRINGE/NEEDLE) 25G X 1" 3 ML MISC Use for B12 injections 100 each 11   No facility-administered medications prior to visit.     Review of Systems:   Constitutional: No weight loss or gain, night sweats, fevers, chills, lassitude, fatigue  HEENT: No difficulty swallowing, tooth/dental problems. No sneezing, itching, ear ache, headaches +nasal congestion, post nasal drip (improving) CV:  No chest pain, orthopnea, PND, swelling in lower extremities, anasarca, dizziness, palpitations, syncope Resp: +minimal dry cough (improved). No shortness of breath with exertion. No hemoptysis. No wheezing.  No chest wall deformity GI:  No heartburn, indigestion GU: No dysuria, change in color of urine, urgency or frequency.   Skin: No rash, lesions, ulcerations MSK:  No joint pain or swelling.   Neuro: No dizziness or lightheadedness.  Psych: No depression or anxiety. Mood stable.     Physical Exam:  BP 108/62 (BP  Location: Right Arm, Patient Position: Sitting, Cuff Size: Large)   Pulse 75   Temp 97.7 F (36.5 C) (Oral)   Ht 5' 6.5" (1.689 m)   Wt 246 lb 6.4 oz (111.8 kg)   SpO2 98%   BMI 39.17 kg/m   GEN: Pleasant, interactive, well-appearing; obese; in no acute distress. HEENT:  Normocephalic and atraumatic. PERRLA. Sclera white. EACs patent bilaterally. TM pearly gray with present light reflex right b/l. Nasal turbinates pale, moist and patent bilaterally. No rhinorrhea present. Oropharynx pink and moist, without exudate or edema. No lesions, ulcerations. No sinus tenderness.  NECK:  Supple w/ fair ROM. No JVD present. Normal carotid impulses w/o bruits. Thyroid  symmetrical with no goiter or nodules palpated. No lymphadenopathy.   CV: RRR, no m/r/g, no peripheral edema. Pulses intact, +2 bilaterally. No cyanosis, pallor or clubbing. PULMONARY:  Unlabored, regular breathing. Clear bilaterally A&P w/o wheezes/rales/rhonchi. No accessory muscle use.  GI: BS present and normoactive. Soft, non-tender to palpation. No organomegaly or masses detected.  MSK: No erythema, warmth or tenderness. Cap refil <2 sec all extrem. No deformities or joint swelling noted.  Neuro: A/Ox3. No focal deficits noted.   Skin: Warm, no lesions or rashe Psych: Normal affect and behavior. Judgement and thought content appropriate.     Lab Results:  CBC    Component Value Date/Time   WBC 7.3 07/08/2023 0821   RBC 4.80 07/08/2023 0821   HGB 12.9 07/08/2023 0821   HGB 12.8 07/11/2021 0812   HCT 38.7 07/08/2023 0821   HCT 39.2 07/11/2021 0812   PLT 365.0 07/08/2023 0821   PLT 360 07/11/2021 0812   MCV 80.7 07/08/2023 0821   MCV 82 07/11/2021 0812   MCH 25.9 (L) 12/28/2022 1147   MCHC 33.2 07/08/2023 0821   RDW 15.6 (H) 07/08/2023 0821   RDW 14.0 07/11/2021 0812   LYMPHSABS 2.1 07/08/2023 0821   LYMPHSABS 2.1 07/11/2021 0812   MONOABS 0.5 07/08/2023 0821   EOSABS 0.1 07/08/2023 0821   EOSABS 0.2  07/11/2021 0812    BASOSABS 0.0 07/08/2023 0821   BASOSABS 0.1 07/11/2021 0812    BMET    Component Value Date/Time   NA 139 07/08/2023 0821   NA 139 07/19/2021 0815   K 4.3 07/08/2023 0821   CL 100 07/08/2023 0821   CO2 30 07/08/2023 0821   GLUCOSE 114 (H) 07/08/2023 0821   BUN 17 07/08/2023 0821   BUN 15 07/19/2021 0815   CREATININE 0.79 07/08/2023 0821   CALCIUM  10.4 07/08/2023 0821   GFRNONAA >60 12/28/2022 1147   GFRAA >60 12/26/2017 1038    BNP    Component Value Date/Time   BNP 22.3 12/28/2022 1214     Imaging:  MM 3D DIAGNOSTIC MAMMOGRAM BILATERAL BREAST Result Date: 08/29/2023 CLINICAL DATA:  Patient presents for bilateral diagnostic exam due to focal pain over the inner lower left breast. EXAM: DIGITAL DIAGNOSTIC BILATERAL MAMMOGRAM WITH TOMOSYNTHESIS AND CAD; ULTRASOUND LEFT BREAST LIMITED TECHNIQUE: Bilateral digital diagnostic mammography and breast tomosynthesis was performed. The images were evaluated with computer-aided detection. ; Targeted ultrasound examination of the left breast was performed. COMPARISON:  Previous exam(s). ACR Breast Density Category b: There are scattered areas of fibroglandular density. FINDINGS: Exam demonstrates no focal abnormality over the inner lower left breast to account for patient's pain. Left breast is otherwise unchanged. Right breast is unchanged. Targeted ultrasound is performed, showing no focal abnormality over the inner lower left breast to account for patient's pain. IMPRESSION: Stable mammogram. No focal abnormality over the inner lower left breast to account for patient's pain. RECOMMENDATION: Recommend continued management of patient's left breast pain on a clinical basis. Otherwise, recommend continued annual bilateral screening mammographic follow-up. I have discussed the findings and recommendations with the patient. If applicable, a reminder letter will be sent to the patient regarding the next appointment. BI-RADS CATEGORY  1: Negative.  Electronically Signed   By: Roda Cirri M.D.   On: 08/29/2023 15:24   US  LIMITED ULTRASOUND INCLUDING AXILLA LEFT BREAST  Result Date: 08/29/2023 CLINICAL DATA:  Patient presents for bilateral diagnostic exam due to focal pain over the inner lower left breast. EXAM: DIGITAL DIAGNOSTIC BILATERAL MAMMOGRAM WITH TOMOSYNTHESIS AND CAD; ULTRASOUND LEFT BREAST LIMITED TECHNIQUE: Bilateral digital diagnostic mammography and breast tomosynthesis was performed. The images were evaluated with computer-aided detection. ; Targeted ultrasound examination of the left breast was performed. COMPARISON:  Previous exam(s). ACR Breast Density Category b: There are scattered areas of fibroglandular density. FINDINGS: Exam demonstrates no focal abnormality over the inner lower left breast to account for patient's pain. Left breast is otherwise unchanged. Right breast is unchanged. Targeted ultrasound is performed, showing no focal abnormality over the inner lower left breast to account for patient's pain. IMPRESSION: Stable mammogram. No focal abnormality over the inner lower left breast to account for patient's pain. RECOMMENDATION: Recommend continued management of patient's left breast pain on a clinical basis. Otherwise, recommend continued annual bilateral screening mammographic follow-up. I have discussed the findings and recommendations with the patient. If applicable, a reminder letter will be sent to the patient regarding the next appointment. BI-RADS CATEGORY  1: Negative. Electronically Signed   By: Roda Cirri M.D.   On: 08/29/2023 15:24    Administration History     None          Latest Ref Rng & Units 12/03/2022   10:18 AM  PFT Results  FVC-Pre L 2.89   FVC-Predicted Pre % 89   FVC-Post L 2.87   FVC-Predicted Post % 88  Pre FEV1/FVC % % 85   Post FEV1/FCV % % 88   FEV1-Pre L 2.45   FEV1-Predicted Pre % 100   FEV1-Post L 2.52   DLCO uncorrected ml/min/mmHg 22.06   DLCO UNC% % 108   DLCO  corrected ml/min/mmHg 22.06   DLCO COR %Predicted % 108   DLVA Predicted % 111   TLC L 5.53   TLC % Predicted % 106   RV % Predicted % 104     Lab Results  Component Value Date   NITRICOXIDE 14 01/08/2023        Assessment & Plan:   Recurrent sinusitis Recurrent sinusitis. Seems to have environmental trigger. Prior blood allergen panel was negative. Has never had skin testing. Will refer her to allergist. Advised trigger prevention and supportive care.   Patient Instructions  Continue guaifenesin [phone] mg Twice daily for congestion Continue zyrtec 1 tab daily    -Switch albuterol  inhaler to Airsupra  2 puffs every 6 hours as needed for shortness of breath or wheezing. Notify if symptoms persist despite rescue inhaler/neb use. . Brush tongue and rinse mouth afterwards.  -Continue saline nasal rinses 1-2 times a day with bottled, distilled water . Follow 30 minutes later with flonase  nasal spray 2 sprays each nostril daily   Make sure you're doing a daily non drowsy allergy pill, the saline rinses and the flonase  anytime you're going down to the beach as it seems there is a trigger there that flares up your symptoms    Continue CPAP nightly Make sure you're cleaning daily and changing the water  in the water  chamber out (distilled water )  Referral to allergist   Follow up in 6 months with Dr. Villa Greaser or Gina Lagos. If symptoms do not improve or worsen, please contact office for sooner follow up or seek emergency care   Mild reactive airways disease Hx of elevated exhaled nitric oxide  testing consistent with type II inflammation. Possible allergic component. Rx for Airsupra  resent for PRN use. Side effect profile reviewed. Continue trigger prevention. See above. Action plan in place  OSA on CPAP OSA on CPAP. Excellent compliance and control. Receives benefit from use. Aware of proper care/use of device. Safe driving practices reviewed.       I spent 35 minutes of  dedicated to the care of this patient on the date of this encounter to include pre-visit review of records, face-to-face time with the patient discussing conditions above, post visit ordering of testing, clinical documentation with the electronic health record, making appropriate referrals as documented, and communicating necessary findings to members of the patients care team.  Roetta Clarke, NP 09/24/2023  Pt aware and understands NP's role.

## 2023-10-01 ENCOUNTER — Other Ambulatory Visit: Payer: Self-pay | Admitting: Internal Medicine

## 2023-10-01 DIAGNOSIS — E538 Deficiency of other specified B group vitamins: Secondary | ICD-10-CM

## 2023-10-12 ENCOUNTER — Other Ambulatory Visit: Payer: Self-pay | Admitting: Internal Medicine

## 2023-10-12 DIAGNOSIS — I10 Essential (primary) hypertension: Secondary | ICD-10-CM

## 2023-10-21 ENCOUNTER — Telehealth: Payer: Self-pay

## 2023-10-21 NOTE — Progress Notes (Signed)
   10/21/2023  Patient ID: Erika [name], female   DOB: 1955-07-19, 67 y.o.   MRN: [id number]  The patients has a diagnosis of hypertension and it is medically necessary for them to have access to a home device to monitor blood pressure.  The patient does not have readily available insurance access to a device and cannot afford to purchase a device at this time.  The patient has been counseled that they do not need to continue to receive services from [facility] to receive a device.  The patient will be given a device free of charge.  Erika [name], PharmD Clinical Pharmacist [phone]

## 2023-10-31 ENCOUNTER — Other Ambulatory Visit: Payer: Self-pay | Admitting: Internal Medicine

## 2023-10-31 DIAGNOSIS — F3342 Major depressive disorder, recurrent, in full remission: Secondary | ICD-10-CM

## 2023-10-31 DIAGNOSIS — E039 Hypothyroidism, unspecified: Secondary | ICD-10-CM

## 2023-12-03 ENCOUNTER — Encounter: Payer: Self-pay | Admitting: Family Medicine

## 2023-12-03 ENCOUNTER — Ambulatory Visit: Payer: Self-pay

## 2023-12-03 ENCOUNTER — Ambulatory Visit (INDEPENDENT_AMBULATORY_CARE_PROVIDER_SITE_OTHER): Admitting: Family Medicine

## 2023-12-03 VITALS — BP 108/70 | HR 61 | Temp 97.7°F | Wt 246.0 lb

## 2023-12-03 DIAGNOSIS — M7662 Achilles tendinitis, left leg: Secondary | ICD-10-CM

## 2023-12-03 MED ORDER — METHYLPREDNISOLONE 4 MG PO TBPK
ORAL_TABLET | ORAL | 0 refills | Status: AC
Start: 2023-12-03 — End: ?

## 2023-12-03 NOTE — Telephone Encounter (Signed)
 Noted

## 2023-12-03 NOTE — Progress Notes (Signed)
   Subjective:    Patient ID: Erika [name], female    DOB: 08/05/1955, 68 y.o.   MRN: [id number]  HPI Here for sharp pain in the back of the left heel that started 3 days ago. No recent trauma, but she has been moving into a new house and she has been carrying things up and down stairs all day. She is taking Tylenol  with no relief.    Review of Systems  Constitutional: Negative.   Respiratory: Negative.    Cardiovascular: Negative.   Musculoskeletal:  Positive for arthralgias.       Objective:   Physical Exam Constitutional:      Comments: In a wheelchair   Cardiovascular:     Rate and Rhythm: Normal rate and regular rhythm.     Pulses: Normal pulses.     Heart sounds: Normal heart sounds.  Pulmonary:     Effort: Pulmonary effort is normal.     Breath sounds: Normal breath sounds.  Musculoskeletal:     Comments: The left foot appears normal with no swelling or erythema. She is quite tender over the insertion of the Achilles tendon onto the left posterior heel   Neurological:     Mental Status: She is alert.           Assessment & Plan:  Achilles tendonitis. She will rest and apply ice packs. She will take a Medrol  dose pack, and she can apply Voltaren gel as needed. Recheck as needed.  Garnette Olmsted, MD

## 2023-12-03 NOTE — Telephone Encounter (Signed)
 FYI Only or Action Required?: FYI only for provider.  Patient was last seen in primary care on 07/08/2023 by Theophilus Andrews, Tully GRADE, MD.  Called Nurse Triage reporting Foot Swelling.  Symptoms began several days ago.  Interventions attempted: OTC medications: tylenol .  Symptoms are: gradually worsening.  Triage Disposition: See Physician Within 24 Hours  Patient/caregiver understands and will follow disposition?: Yes        Copied from CRM [id number]. Topic: Clinical - Red Word Triage >> Dec 03, 2023  8:34 AM Pinkey ORN wrote: Red Word that prompted transfer to Nurse Triage: Swelling In Left Foot >> Dec 03, 2023  8:36 AM Pinkey ORN wrote: Patient states on Sunday morning her left foot was hurting, but now she's unable to put any pressure on it / let alone walk. Patient states the pain is originally in her heel.  Reason for Disposition  [1] Swollen foot AND [2] no fever  (Exceptions: Localized bump from bunions, calluses, insect bite, sting.)  Answer Assessment - Initial Assessment Questions 1. ONSET: When did the pain start?      Sunday morning when pt got up 2. LOCATION: Where is the pain located?      Left foot, heel area 3. PAIN: How bad is the pain?    (Scale 1-10; or mild, moderate, severe)     7/10 4. WORK OR EXERCISE: Has there been any recent work or exercise that involved this part of the body?      no 5. CAUSE: What do you think is causing the foot pain?     Has been on her feet a lot moving out of her apartment 6. OTHER SYMPTOMS: Do you have any other symptoms? (e.g., leg pain, rash, fever, numbness)     A little swelling,  Protocols used: Foot Pain-A-AH

## 2023-12-19 ENCOUNTER — Encounter (HOSPITAL_BASED_OUTPATIENT_CLINIC_OR_DEPARTMENT_OTHER): Payer: Self-pay

## 2023-12-19 ENCOUNTER — Emergency Department (HOSPITAL_BASED_OUTPATIENT_CLINIC_OR_DEPARTMENT_OTHER)
Admission: EM | Admit: 2023-12-19 | Discharge: 2023-12-19 | Disposition: A | Discharge status: Home / Self Care | Attending: Emergency Medicine | Admitting: Emergency Medicine

## 2023-12-19 ENCOUNTER — Other Ambulatory Visit: Payer: Self-pay

## 2023-12-19 ENCOUNTER — Emergency Department (HOSPITAL_BASED_OUTPATIENT_CLINIC_OR_DEPARTMENT_OTHER): Admitting: Radiology

## 2023-12-19 DIAGNOSIS — I509 Heart failure, unspecified: Secondary | ICD-10-CM | POA: Insufficient documentation

## 2023-12-19 DIAGNOSIS — M79603 Pain in arm, unspecified: Secondary | ICD-10-CM | POA: Diagnosis not present

## 2023-12-19 DIAGNOSIS — I2510 Atherosclerotic heart disease of native coronary artery without angina pectoris: Secondary | ICD-10-CM | POA: Diagnosis not present

## 2023-12-19 DIAGNOSIS — M79601 Pain in right arm: Secondary | ICD-10-CM | POA: Diagnosis not present

## 2023-12-19 DIAGNOSIS — R079 Chest pain, unspecified: Secondary | ICD-10-CM

## 2023-12-19 DIAGNOSIS — Z7902 Long term (current) use of antithrombotics/antiplatelets: Secondary | ICD-10-CM | POA: Diagnosis not present

## 2023-12-19 DIAGNOSIS — E119 Type 2 diabetes mellitus without complications: Secondary | ICD-10-CM | POA: Insufficient documentation

## 2023-12-19 DIAGNOSIS — R0789 Other chest pain: Secondary | ICD-10-CM | POA: Insufficient documentation

## 2023-12-19 DIAGNOSIS — Z79899 Other long term (current) drug therapy: Secondary | ICD-10-CM | POA: Diagnosis not present

## 2023-12-19 LAB — BASIC METABOLIC PANEL WITH GFR
Anion gap: 13 (ref 5–15)
BUN: 14 mg/dL (ref 8–23)
CO2: 27 mmol/L (ref 22–32)
Calcium: 10.5 mg/dL — ABNORMAL HIGH (ref 8.9–10.3)
Chloride: 100 mmol/L (ref 98–111)
Creatinine, Ser: 0.94 mg/dL (ref 0.44–1.00)
GFR, Estimated: >60 mL/min (ref >60)
Glucose, Bld: 131 mg/dL — ABNORMAL HIGH (ref 70–99)
Potassium: 3.6 mmol/L (ref 3.5–5.1)
Sodium: 140 mmol/L (ref 135–145)

## 2023-12-19 LAB — CBC
HCT: 38.5 % (ref 36.0–46.0)
Hemoglobin: 12.2 g/dL (ref 12.0–15.0)
MCH: 25.9 pg — ABNORMAL LOW (ref 26.0–34.0)
MCHC: 31.7 g/dL (ref 30.0–36.0)
MCV: 81.7 fL (ref 80.0–100.0)
Platelets: 305 K/uL (ref 150–400)
RBC: 4.71 MIL/uL (ref 3.87–5.11)
RDW: 15.5 % (ref 11.5–15.5)
WBC: 6.9 K/uL (ref 4.0–10.5)
nRBC: 0 % (ref 0.0–0.2)

## 2023-12-19 LAB — TROPONIN T, HIGH SENSITIVITY
Troponin T High Sensitivity: <15 ng/L (ref 0–19)
Troponin T High Sensitivity: <15 ng/L (ref 0–19)

## 2023-12-19 MED ORDER — ASPIRIN 81 MG PO CHEW
324.0000 mg | CHEWABLE_TABLET | Freq: Once | ORAL | Status: AC
  Administered 2023-12-19: 324 mg via ORAL
  Filled 2023-12-19: qty 4

## 2023-12-19 NOTE — ED Notes (Signed)
 She reports being under more stress than normal after the death of her son in 2023-10-26.

## 2023-12-19 NOTE — ED Provider Notes (Signed)
 [place] EMERGENCY DEPARTMENT AT [facility] Provider Note   CSN: [id number] Arrival date & time: 12/19/23  1051     Patient presents with: Chest Pain   Erika [name] is a 68 y.o. female.  With a history of CHF CAD type 2 diabetes who presents to the ED for chest pain.  Patient was at urgent care where her husband was being evaluated earlier today when she began develop acute onset chest pain.  Chest pain has resolved since coming to the ED.  She notes increased life stressors including the loss of a family member 2 months ago.  No shortness of breath nausea vomiting diaphoresis or other complaints this time    Chest Pain      Prior to Admission medications   Medication Sig Start Date End Date Taking? Authorizing Provider  acetaminophen  (TYLENOL ) 650 MG CR tablet Take 650-1,300 mg by mouth every 8 (eight) hours as needed for pain.    [provider]  Albuterol -Budesonide  (AIRSUPRA ) 90-80 MCG/ACT AERO Inhale 1-2 puffs into the lungs every 6 (six) hours as needed (shortness of breath, wheezing). 09/24/23   Cobb, Comer GAILS, NP  atorvastatin  (LIPITOR) 40 MG tablet TAKE 1 TABLET BY MOUTH EVERY DAY 08/19/23   Theophilus Andrews, Tully GRADE, MD  Cholecalciferol  (VITAMIN D3 SUPER STRENGTH) 50 MCG (2000 UT) CAPS Take 2,000 Units by mouth every evening.    [provider]  citalopram  (CELEXA ) 40 MG tablet TAKE 1 TABLET BY MOUTH EVERYDAY AT BEDTIME 10/31/23   Theophilus Andrews, Tully GRADE, MD  clopidogrel  (PLAVIX ) 75 MG tablet TAKE 1 TABLET BY MOUTH EVERY DAY 10/31/23   Theophilus Andrews, Tully GRADE, MD  cyanocobalamin  (VITAMIN B12) 1000 MCG/ML injection INJECT 1 ML ONCE A MONTH 10/01/23   Theophilus Andrews, Tully GRADE, MD  diphenhydramine-acetaminophen  (TYLENOL  PM) 25-500 MG TABS tablet Take 1 tablet by mouth at bedtime as needed (sleep).    [provider]  hydrochlorothiazide  (HYDRODIURIL ) 25 MG tablet TAKE 1 TABLET(25 MG) BY MOUTH EVERY EVENING 10/14/23   Theophilus Andrews,  Tully GRADE, MD  hydrOXYzine  (VISTARIL ) 25 MG capsule TAKE 1 CAPSULE (25 MG TOTAL) BY MOUTH AT BEDTIME AND MAY REPEAT DOSE ONE TIME IF NEEDED. 07/22/23   Theophilus Andrews, Tully GRADE, MD  levothyroxine  (SYNTHROID ) 75 MCG tablet TAKE 1 TABLET BY MOUTH EVERY DAY BEFORE BREAKFAST 10/31/23   Theophilus Andrews, Tully GRADE, MD  methylPREDNISolone  (MEDROL  DOSEPAK) 4 MG TBPK tablet As directed 12/03/23   Johnny Garnette LABOR, MD  metoprolol  succinate (TOPROL -XL) 25 MG 24 hr tablet TAKE 1/2 TABLET BY MOUTH DAILY 07/18/23   Theophilus Andrews, Tully GRADE, MD  nitroGLYCERIN  (NITROSTAT ) 0.4 MG SL tablet Place 1 tablet (0.4 mg total) under the tongue every 5 (five) minutes as needed for chest pain. Up to 3 times. 08/02/22 12/03/23  Theophilus Andrews, Tully GRADE, MD  SYRINGE-NEEDLE, DISP, 3 ML (BD SAFETYGLIDE SYRINGE/NEEDLE) 25G X 1 3 ML MISC Use for B12 injections 05/06/20   Theophilus Andrews, Tully GRADE, MD    Allergies: Tape and Tetracycline hcl    Review of Systems  Cardiovascular:  Positive for chest pain.    Updated Vital Signs BP (!) 143/61 (BP Location: Right Arm)   Pulse 61   Temp 97.6 F (36.4 C) (Oral)   Resp 14   SpO2 97%   Physical Exam Vitals and nursing note reviewed.  HENT:     Head: Normocephalic and atraumatic.  Eyes:     Pupils: Pupils are equal, round, and reactive to light.  Cardiovascular:     Rate and Rhythm: Normal rate and regular rhythm.  Pulmonary:     Effort: Pulmonary effort is normal.     Breath sounds: Normal breath sounds.  Abdominal:     Palpations: Abdomen is soft.     Tenderness: There is no abdominal tenderness.  Musculoskeletal:     Right lower leg: No edema.     Left lower leg: No edema.  Skin:    General: Skin is warm and dry.  Neurological:     Mental Status: She is alert.  Psychiatric:        Mood and Affect: Mood normal.     (all labs ordered are listed, but only abnormal results are displayed) Labs Reviewed  BASIC METABOLIC PANEL WITH GFR - Abnormal; Notable for the  following components:      Result Value   Glucose, Bld 131 (*)    Calcium  10.5 (*)    All other components within normal limits  CBC - Abnormal; Notable for the following components:   MCH 25.9 (*)    All other components within normal limits  TROPONIN T, HIGH SENSITIVITY  TROPONIN T, HIGH SENSITIVITY    EKG: EKG Interpretation Date/Time:  Thursday December 19 2023 11:00:03 EDT Ventricular Rate:  65 PR Interval:  182 QRS Duration:  105 QT Interval:  428 QTC Calculation: 445 R Axis:   54  Text Interpretation: Sinus rhythm Low voltage, precordial leads Confirmed by Pamella Sharper [phone]) on 12/19/2023 12:17:07 PM  Radiology: DG Chest 2 View Result Date: 12/19/2023 EXAM: 2 VIEW(S) XRAY OF THE CHEST 12/19/2023 11:35:00 AM COMPARISON: AP radiograph of chest dated 12/28/2002. CLINICAL HISTORY: Chest pain. Patient states she took her husband to urgent care and when she was there she began having chest pain. States history of MI with stent. FINDINGS: LUNGS AND PLEURA: No focal pulmonary opacity. No pulmonary edema. No pleural effusion. No pneumothorax. HEART AND MEDIASTINUM: No acute abnormality of the cardiac and mediastinal silhouettes. BONES AND SOFT TISSUES: No acute osseous abnormality. IMPRESSION: 1. No acute process. Electronically signed by: Evalene Coho MD 12/19/2023 11:42 AM EDT RP Workstation: [id number]     Procedures   Medications Ordered in the ED  aspirin  chewable tablet 324 mg (324 mg Oral Given 12/19/23 1242)                                    Medical Decision Making 68 year old female with history as above presented to ED given concern for acute onset chest pain while her husband was being evaluated in urgent care.  Chest pain has resolved.  Afebrile slightly hypertensive.  Well-appearing.  Benign physical exam.  Low suspicion for ACS with 2 negative troponins and no ischemic changes on EKG.  No evidence of dysrhythmia.  Patient reports feeling well.  Chest x-ray  looks clear.  No signs of pulmonary edema or focal consolidation.  Remainder of laboratory workup unremarkable.  She will follow-up with her PCP.  Amount and/or Complexity of Data Reviewed Labs: ordered. Radiology: ordered.  Risk OTC drugs.        Final diagnoses:  Chest pain, unspecified type    ED Discharge Orders     None          Pamella Sharper LABOR, DO 12/19/23 1346

## 2023-12-19 NOTE — ED Triage Notes (Signed)
 Patient states she took her husband to urgent care and when she was there she began having chest pain. States hx of MI with stent.

## 2023-12-19 NOTE — Discharge Instructions (Signed)
 You were seen in the emergency room for chest pain Your EKG and blood work looked okay Your chest x-ray was clear We are not certain what is causing your chest pain It may be related to increased life stressors Please continue taking all previous Indication to follow-up with your primary doctor and cardiology Return to the emergency room for severe chest pain trouble breathing or any other concerns

## 2023-12-30 ENCOUNTER — Other Ambulatory Visit: Payer: Self-pay | Admitting: Internal Medicine

## 2024-01-04 ENCOUNTER — Other Ambulatory Visit: Payer: Self-pay | Admitting: Internal Medicine

## 2024-01-04 DIAGNOSIS — J209 Acute bronchitis, unspecified: Secondary | ICD-10-CM

## 2024-01-06 ENCOUNTER — Telehealth: Payer: Self-pay

## 2024-01-06 NOTE — Progress Notes (Signed)
   01/06/2024  Patient ID: Erika [name], female   DOB: 09-20-1955, 68 y.o.   MRN: [id number]  Patient was present with husband for his in office visit. Discussed that she has recently been diagnosed with diabetes and wondering if she can start Ozempic.  Discussed with PCP, okay to start Ozempic 0.25mg . Submitting application to [employer] for PAP.   Follow Up: 01/27/24  Erika [name], PharmD Clinical Pharmacist [phone]

## 2024-01-08 ENCOUNTER — Telehealth: Payer: Self-pay

## 2024-01-08 NOTE — Progress Notes (Signed)
   01/08/2024  Patient ID: Erika [name], female   DOB: [birth year], 68 y.o.   MRN: [id number]  Received notification from [employer] regarding patient's application for Ozempic PAP. Company requiring proof of income in order to continue processing both her and her husband's application.  She will work on obtaining a copy of their tax return and bring to office.  Erika [name], PharmD Clinical Pharmacist [phone]

## 2024-01-13 ENCOUNTER — Telehealth: Payer: Self-pay

## 2024-01-13 NOTE — Progress Notes (Signed)
   01/13/2024  Patient ID: Jon JONETTA Sharps, female   DOB: [birth year], 68 y.o.   MRN: [id number]  Patient returned requested proof of income for continued processing of Ozempic PAP via [employer].  Faxed into company, confirmation received via fax. Pending further processing.  Jon VEAR Lindau, PharmD Clinical Pharmacist [phone]

## 2024-01-22 ENCOUNTER — Telehealth: Payer: Self-pay | Admitting: *Deleted

## 2024-01-22 NOTE — Telephone Encounter (Signed)
 Left message on machine for patient.  Medication Samples have been provided to the patient.  Drug name: Ozempic       Strength: 0.25/0.5 mg  4 pens.  Patient assistance.

## 2024-01-25 IMAGING — CT CT HEART MORP W/ CTA COR W/ SCORE W/ CA W/CM &/OR W/O CM
4 of 7 series · 8 of 20 positions shown, 9 images · non-contrast
Comparison: CT February 17, 2020
COMPARISON: CT February 17, 2020

Addendum:
EXAM:
OVER-READ INTERPRETATION  CT CHEST

The following report is an over-read performed by radiologist Dr.
Rantona Bhebhe [REDACTED] on 07/05/2021. This
over-read does not include interpretation of cardiac or coronary
anatomy or pathology. The coronary calcium score/coronary CTA
interpretation by the cardiologist is attached.
CLINICAL DATA: 65 Year old White Female
Cardiac/Coronary  CTA
TECHNIQUE: The patient was scanned on a [employer] Force scanner.

[Series 6: ts diast sharp · axial · 0.38mm/px · z∈[+1312,+1346]mm · 2 of 256 slices shown]
[im 86/256  lung]
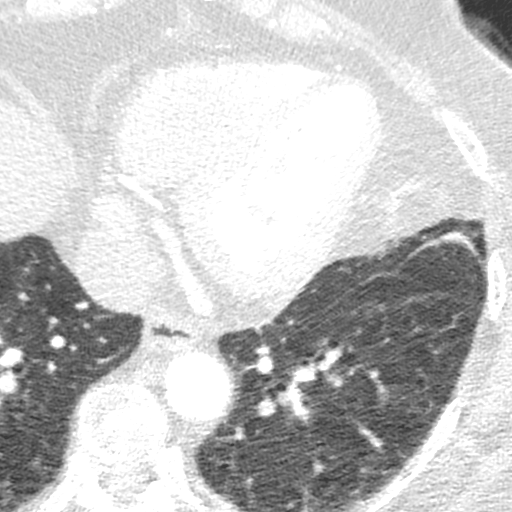
[im 171/256  lung]
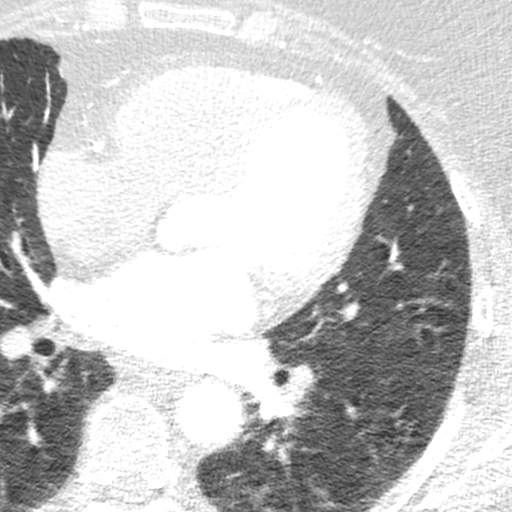

[Series 7: ts syst sharp · axial · 0.44mm/px · z∈[+1312,+1346]mm · 2 of 255 slices shown]
[im 85/255  lung]
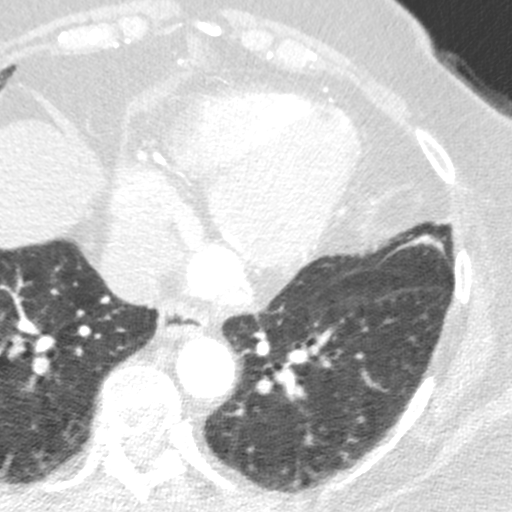
[im 170/255  lung]
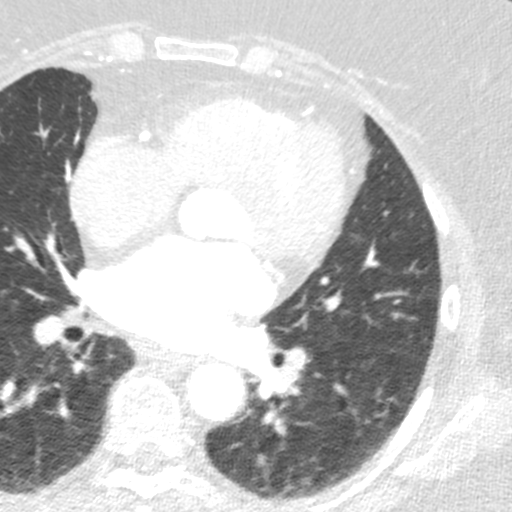

[Series 8: best syst · axial · 0.38mm/px · z∈[+1312,+1346]mm · 2 of 256 slices shown, 3 images]
[im 86/256  vessel]
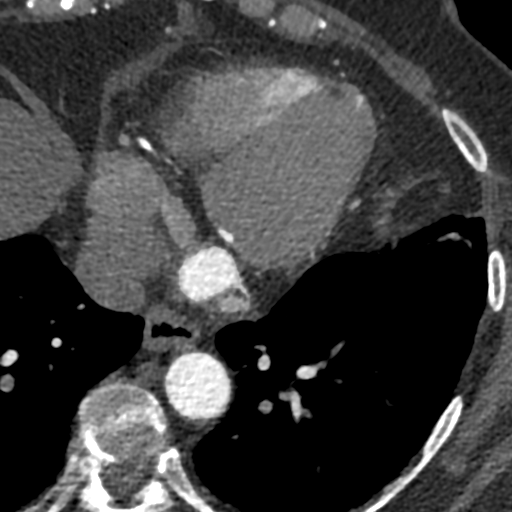
[im 86/256  lung]
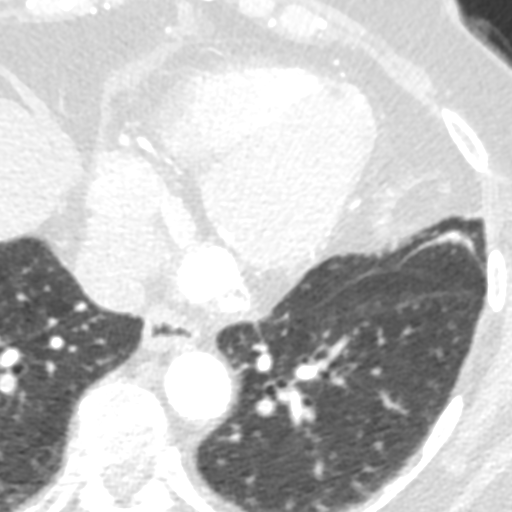
[im 171/256  vessel]
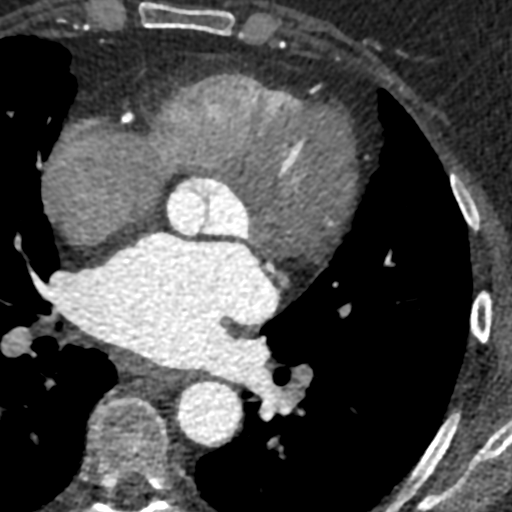

[Series 9: best diast · axial · 0.38mm/px · z∈[+1312,+1346]mm · 2 of 256 slices shown]
[im 86/256  vessel]
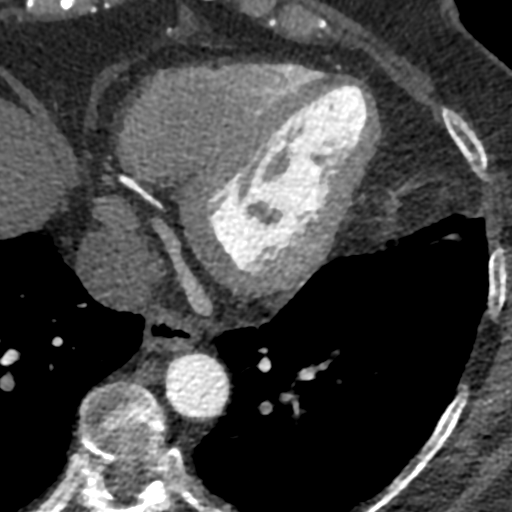
[im 171/256  vessel]
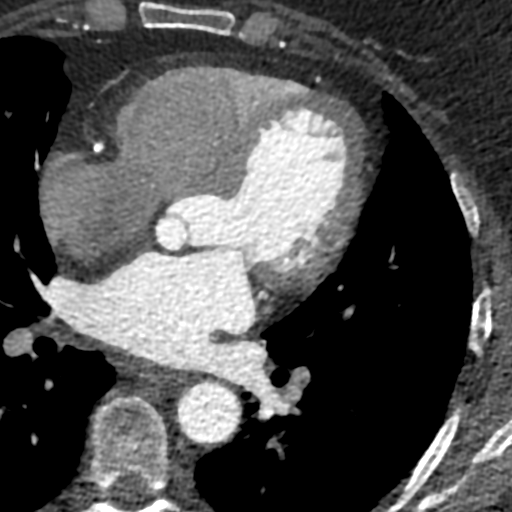

[8 of 20 positions shown; findings below may reference images not displayed]

FINDINGS: Vascular: No acute non-cardiac vascular finding. Aortic
atherosclerosis.

Mediastinum/Nodes: No pathologically enlarged mediastinal, or hilar
lymph nodes within the visualized portions of the thorax. Visualized
portions of the esophagus are grossly unremarkable. Small hiatal
hernia

Lungs/Pleura: Solid 4 mm right upper lobe pulmonary nodule on image
[DATE]. Within the visualized portions of the thorax there is no acute
consolidative airspace disease, no pleural effusions and no
pneumothorax

Upper Abdomen: Visualized portions of the upper abdomen are
unremarkable.

Musculoskeletal: There are no aggressive appearing lytic or blastic
lesions noted in the visualized portions of the skeleton.
IMPRESSION: 1. Solid 4 mm right upper lobe pulmonary nodule. Although likely
benign, if the patient is high-risk, given the morphology and/or
location of this nodule a non-contrast chest CT can be considered in
12 months.This recommendation follows the consensus statement:
Guidelines for Management of Incidental Pulmonary Nodules Detected
2. Small hiatal hernia.
3. Aortic Atherosclerosis (FMZEK-14Q.Q).
FINDINGS: Scan was triggered in the descending thoracic aorta. Axial
non-contrast 3 mm slices were carried out through the heart. The
data set was analyzed on a dedicated work station and scored using
the Agatson method. Gantry rotation speed was 250 msecs and
collimation was .6 mm. 0.8 mg of sl NTG was given. The 3D data set
was reconstructed in 5% intervals of the 67-82 % of the R-R cycle.
Diastolic phases were analyzed on a dedicated work station using
MPR, MIP and VRT modes. The patient received 95 cc of contrast.

Aorta:  Normal size.  No calcifications.

Main Pulmonary Artery: Normal size of the pulmonary artery.

Aortic Valve:  Tri-leaflet.  No calcifications.

Coronary Arteries:  Normal coronary origin.  Right dominance.

Coronary Calcium Score:

Left main: 13

Left anterior descending artery: 258

Left circumflex artery: 3

Right coronary artery: 321

Total: 595

Percentile: 96th for age, sex, and race matched control.

RCA is a large dominant artery that gives rise to PDA and PLA.
Minimal calcified ostial plaque. Mild calcified plaque mid and
distal vessel.

Left main is a large artery that gives rise to LAD and LCX arteries.
Minimal calcified plaque distal vessel.

LAD is a large vessel that gives rise to one large D1 Branch.
Moderate mixed calcified stenosis mid LAD. Severe calcified stenosis
proximal D2. Severe stenosis proximal D1 (small vessel).

LCX is a non-dominant artery that gives rise to one large OM1
branch. Minimal calcified mid vessel plaque.

Other findings:

Normal pulmonary vein drainage into the left atrium.

Normal left atrial appendage without a thrombus.

Extra-cardiac findings: See attached radiology report for
non-cardiac structures.
IMPRESSION: 1. Coronary calcium score of 595. This was 96th percentile for age,
sex, and race matched control.

2. Normal coronary origin with right dominance.

3. CAD-RADS 4 Severe stenosis. (70-99% or > 50% left main). Cardiac
catheterization or CT FFR is recommended. Consider symptom-guided
anti-ischemic pharmacotherapy as well as risk factor modification
per guideline directed care.

RECOMMENDATIONS:



If CAC = 0, it is reasonable to withhold statin therapy and reassess
in 5 to 10 years, as long as higher risk conditions are absent
(diabetes mellitus, family history of premature CHD in first degree
relatives (males <55 years; females <65 years), cigarette smoking,
LDL >=190 mg/dL or other independent risk factors).

If CAC is 1 to 99, it is reasonable to initiate statin therapy for
patients >=55 years of age.

If CAC is >=100 or >=75th percentile, it is reasonable to initiate
statin therapy at any age.

Cardiology referral should be considered for patients with CAC
scores =400 or >=75th percentile.

*4657 AHA/ACC/AACVPR/AAPA/ABC/EMPRATOOR/IZZI/ESPINA/Eunus/LOPZ/SCARLETT/KLPIGBB
Guideline on the Management of Blood Cholesterol: A Report of the
American College of Cardiology/American Heart Association Task Force
on Clinical Practice Guidelines. J Am Coll Cardiol.
5378;73(24):3761-3184.

*** End of Addendum ***
EXAM:
OVER-READ INTERPRETATION  CT CHEST

The following report is an over-read performed by radiologist Dr.
Rantona Bhebhe [REDACTED] on 07/05/2021. This
over-read does not include interpretation of cardiac or coronary
anatomy or pathology. The coronary calcium score/coronary CTA
interpretation by the cardiologist is attached.
FINDINGS: Vascular: No acute non-cardiac vascular finding. Aortic
atherosclerosis.

Mediastinum/Nodes: No pathologically enlarged mediastinal, or hilar
lymph nodes within the visualized portions of the thorax. Visualized
portions of the esophagus are grossly unremarkable. Small hiatal
hernia

Lungs/Pleura: Solid 4 mm right upper lobe pulmonary nodule on image
[DATE]. Within the visualized portions of the thorax there is no acute
consolidative airspace disease, no pleural effusions and no
pneumothorax

Upper Abdomen: Visualized portions of the upper abdomen are
unremarkable.

Musculoskeletal: There are no aggressive appearing lytic or blastic
lesions noted in the visualized portions of the skeleton.
IMPRESSION: 1. Solid 4 mm right upper lobe pulmonary nodule. Although likely
benign, if the patient is high-risk, given the morphology and/or
location of this nodule a non-contrast chest CT can be considered in
12 months.This recommendation follows the consensus statement:
Guidelines for Management of Incidental Pulmonary Nodules Detected
2. Small hiatal hernia.
3. Aortic Atherosclerosis (FMZEK-14Q.Q).

## 2024-01-27 ENCOUNTER — Ambulatory Visit

## 2024-01-27 DIAGNOSIS — E1165 Type 2 diabetes mellitus with hyperglycemia: Secondary | ICD-10-CM

## 2024-01-27 MED ORDER — ACCU-CHEK GUIDE TEST VI STRP: ORAL_STRIP | 0 refills | Status: AC

## 2024-01-27 MED ORDER — ACCU-CHEK GUIDE ME W/DEVICE KIT: PACK | 0 refills | Status: AC

## 2024-01-27 MED ORDER — ACCU-CHEK SOFTCLIX LANCETS MISC: 0 refills | Status: AC

## 2024-01-27 NOTE — Progress Notes (Signed)
 01/27/2024 Name: Erika [name] MRN: [id number] DOB: [birth year]  Chief Complaint  Patient presents with   Medication Management   Diabetes    Erika [name] is a 68 y.o. year old female who was referred for medication management by their primary care provider, Theophilus Andrews, Tully GRADE, MD. They presented for a face to face visit today.   They were referred to the pharmacist by their PCP for assistance in managing diabetes    Subjective:  Care Team: Primary Care Provider: Theophilus Andrews, Tully GRADE, MD  Medication Access/Adherence  Current Pharmacy:  [facility] [id number] [address], [place] - [address] AT [facility] [address] [place] [address] Phone: [phone] Fax: [phone]  [facility] [address] [place] [address] Phone: [phone] Fax: [phone]   Patient reports affordability concerns with their medications: No  Patient reports access/transportation concerns to their pharmacy: No  Patient reports adherence concerns with their medications:  No     Diabetes:  Current medications: Ozempic 0.25mg  (recently approved via PAP, has not received first shipment or started yet) Medications tried in the past: None  Current glucose readings: None, does not have device to check  Observed patterns:  Patient denies hypoglycemic s/sx including dizziness, shakiness, sweating. Patient denies hyperglycemic symptoms including polyuria, polydipsia, polyphagia, nocturia, neuropathy, blurred vision.   Current medication access support: Ozempic through [employer]  Macrovascular and Microvascular Risk Reduction:  Statin? yes (lipitor); ACEi/ARB? No, need uACR Last urinary albumin/creatinine ratio:   Last eye exam:   Last foot exam: No foot exam found Tobacco Use:  Tobacco Use: Medium Risk (12/19/2023)   Patient History    Smoking Tobacco Use: Former    Smokeless Tobacco Use: Never     Passive Exposure: Not on file     Objective:  Lab Results  Component Value Date   HGBA1C 6.5 07/08/2023    Lab Results  Component Value Date   CREATININE 0.94 12/19/2023   BUN 14 12/19/2023   NA 140 12/19/2023   K 3.6 12/19/2023   CL 100 12/19/2023   CO2 27 12/19/2023    Lab Results  Component Value Date   CHOL 156 07/08/2023   HDL 53.10 07/08/2023   LDLCALC 65 07/08/2023   LDLDIRECT 76.0 08/02/2022   TRIG 189.0 (H) 07/08/2023   CHOLHDL 3 07/08/2023    Medications Reviewed Today     Reviewed by Lionell Jon DEL, RPH (Pharmacist) on 01/27/24 at 1550  Med List Status: <None>   Medication Order Taking? Sig Documenting Provider Last Dose Status Informant  Accu-Chek Softclix Lancets lancets [id number] Yes Use to check blood sugars once daily. Dx: Z88.34 Theophilus Andrews, Tully GRADE, MD  Active   acetaminophen  (TYLENOL ) 650 MG CR tablet [id number]  Take 650-1,300 mg by mouth every 8 (eight) hours as needed for pain. [provider]  Active Self  Albuterol -Budesonide  (AIRSUPRA ) 90-80 MCG/ACT AERO [id number]  Inhale 1-2 puffs into the lungs every 6 (six) hours as needed (shortness of breath, wheezing). Malachy Comer GAILS, NP  Active   atorvastatin  (LIPITOR) 40 MG tablet [id number]  TAKE 1 TABLET BY MOUTH EVERY DAY Theophilus Andrews, Tully GRADE, MD  Active   Blood Glucose Monitoring Suppl (ACCU-CHEK GUIDE ME) w/Device KIT [id number] Yes Use to check blood sugars once daily Dx: E11.65 Theophilus Andrews, Tully GRADE, MD  Active   Cholecalciferol  (VITAMIN D3 SUPER STRENGTH) 50 MCG (2000 UT) CAPS [id number]  Take 2,000 Units by mouth  every evening. [provider]  Active Self           Med Note CANDISE, CRABTREE   Wed Sep 05, 2022  9:54 AM) Taking 2 daily  citalopram  (CELEXA ) 40 MG tablet [id number]  TAKE 1 TABLET BY MOUTH EVERYDAY AT BEDTIME Theophilus Andrews, Tully GRADE, MD  Active   clopidogrel  (PLAVIX ) 75 MG tablet [id number]  TAKE 1 TABLET BY MOUTH EVERY DAY Theophilus Andrews,  Tully GRADE, MD  Active   cyanocobalamin  (VITAMIN B12) 1000 MCG/ML injection [id number]  INJECT 1 ML ONCE A MONTH Theophilus Andrews, Tully GRADE, MD  Active   diphenhydramine-acetaminophen  (TYLENOL  PM) 25-500 MG TABS tablet [id number]  Take 1 tablet by mouth at bedtime as needed (sleep). [provider]  Active Self  glucose blood (ACCU-CHEK GUIDE TEST) test strip [id number] Yes Use to check blood sugars once daily Dx E11.65 Theophilus Andrews, Estela Y, MD  Active   hydrochlorothiazide  (HYDRODIURIL ) 25 MG tablet [id number]  TAKE 1 TABLET(25 MG) BY MOUTH EVERY EVENING Theophilus Andrews, Tully GRADE, MD  Active   hydrOXYzine  (VISTARIL ) 25 MG capsule [id number]  TAKE 1 CAPSULE BY MOUTH AT BEDTIME AND MAY REPEAT DOSE ONE TIME IF NEEDED. Theophilus Andrews, Tully GRADE, MD  Active   levothyroxine  (SYNTHROID ) 75 MCG tablet [id number]  TAKE 1 TABLET BY MOUTH EVERY DAY BEFORE BREAKFAST Theophilus Andrews, Tully GRADE, MD  Active   methylPREDNISolone  (MEDROL  DOSEPAK) 4 MG TBPK tablet [id number]  As directed Johnny Garnette LABOR, MD  Active   metoprolol  succinate (TOPROL -XL) 25 MG 24 hr tablet [id number]  TAKE 1/2 TABLET BY MOUTH DAILY Theophilus Andrews, Tully GRADE, MD  Active   nitroGLYCERIN  (NITROSTAT ) 0.4 MG SL tablet [id number]  Place 1 tablet (0.4 mg total) under the tongue every 5 (five) minutes as needed for chest pain. Up to 3 times. Theophilus Andrews, Tully GRADE, MD  Expired 12/03/23 2359   SYRINGE-NEEDLE, DISP, 3 ML (BD SAFETYGLIDE SYRINGE/NEEDLE) 25G X 1 3 ML MISC [id number]  Use for B12 injections Theophilus Andrews, Tully GRADE, MD  Active Self              Assessment/Plan:   Diabetes: - Currently controlled; goal A1c <7%. - Reviewed long term cardiovascular and renal outcomes of uncontrolled blood sugar. and Reviewed goal A1c, goal fasting, and goal 2 hour post prandial glucose. Recommended to check glucose daily or as needed for symptoms of low/high BG - Recommend to start Ozempic 0.25mg . Provided sample in office and  reviewed use .Waiting for first PAP shipment. Did notify that program is ending for medicare patients and will need to get through insurance next year if affordable. - Patient denies personal or family history of multiple endocrine neoplasia type 2, medullary thyroid  cancer; personal history of pancreatitis or gallbladder disease., Discussed side effects of gastrointestinal upset/nausea; eating smaller meals, avoiding high-fat foods, and remaining upright after eating may reduce nausea. Discussed that overeating is a major trigger of nausea with this class of medications, as often times patients will start to feel full sooner and may need to decrease portion sizes from what they were previously accustomed to.   Follow Up Plan: 1 month  JON DEL Lindau, PharmD Clinical Pharmacist [phone]

## 2024-01-31 DIAGNOSIS — I110 Hypertensive heart disease with heart failure: Secondary | ICD-10-CM | POA: Diagnosis not present

## 2024-01-31 DIAGNOSIS — F3341 Major depressive disorder, recurrent, in partial remission: Secondary | ICD-10-CM | POA: Diagnosis not present

## 2024-01-31 DIAGNOSIS — Z008 Encounter for other general examination: Secondary | ICD-10-CM | POA: Diagnosis not present

## 2024-02-11 ENCOUNTER — Telehealth: Payer: Self-pay | Admitting: *Deleted

## 2024-02-11 DIAGNOSIS — Z1211 Encounter for screening for malignant neoplasm of colon: Secondary | ICD-10-CM

## 2024-02-11 NOTE — Telephone Encounter (Signed)
 Copied from CRM [id number]. Topic: Referral - Request for Referral >> Feb 11, 2024  1:31 PM Shereese L wrote: Did the patient discuss referral with their provider in the last year? Yes (If No - schedule appointment) (If Yes - send message)  Appointment offered? No  Type of order/referral and detailed reason for visit: referral for colonoscopy   Preference of office, provider, location: Norleen Kiang [facility] [address] [place] [address] [place], [place] [address]  If referral order, have you been seen by this specialty before? No (If Yes, this issue or another issue? When? Where?  Can we respond through MyChart? No

## 2024-02-11 NOTE — Telephone Encounter (Signed)
 Referral was placed

## 2024-02-13 ENCOUNTER — Other Ambulatory Visit (HOSPITAL_BASED_OUTPATIENT_CLINIC_OR_DEPARTMENT_OTHER)

## 2024-02-19 ENCOUNTER — Telehealth: Payer: Self-pay | Admitting: Nurse Practitioner

## 2024-02-19 DIAGNOSIS — G4733 Obstructive sleep apnea (adult) (pediatric): Secondary | ICD-10-CM

## 2024-02-19 NOTE — Telephone Encounter (Signed)
 Patient requesting order for new cpap supplies. Last OV:09/24/23  Nex OV:?

## 2024-02-19 NOTE — Telephone Encounter (Signed)
 Copied from CRM [id number]. Topic: Clinical - Order For Equipment >> Feb 19, 2024  9:44 AM Erika SAUNDERS wrote: Reason for CRM: Patient is requesting Comer Rouleau to please order her CPAP supplies to [facility].

## 2024-02-19 NOTE — Telephone Encounter (Signed)
 Last note says 6 months so should be due sometime in December. Fine to send order for updated supplies as she's been seen within a year.

## 2024-03-18 ENCOUNTER — Ambulatory Visit: Payer: Self-pay

## 2024-03-18 NOTE — Telephone Encounter (Signed)
 FYI Only or Action Required?: FYI only for provider: appointment scheduled on 12/4.  Patient was last seen in primary care on 12/03/2023 by Erika [name] LABOR, MD.  Called Nurse Triage reporting Leg Pain.  Symptoms began a week ago.  Interventions attempted: Rest, hydration, or home remedies.  Symptoms are: gradually worsening.  Triage Disposition: See PCP When Office is Open (Within 3 Days)  Patient/caregiver understands and will follow disposition?: Yes  Copied from CRM [id number]. Topic: Clinical - Red Word Triage >> Mar 18, 2024  3:55 PM Erika [name] wrote: Red Word that prompted transfer to Nurse Triage: Right leg is hurting. Believes she pulled a muscle in her stomach, difficulty walking. Pain level 6 or 7 currently. At night, 8 Reason for Disposition  [1] MODERATE pain (e.g., interferes with normal activities, limping) AND [2] present > 3 days  Answer Assessment - Initial Assessment Questions Bottom part of stomach on right side is like its pulling and goes down her leg  Nausea and slightly light headed when the pain is bad. Denies vomiting or pain to RLQ.SABRADenies numbness and tingling  Work on the house- 1.5weeks ago- thinks she maybe pulled a muscle in her abdomen- when she lays on the right hip/leg it is severe. Low back pain- right side as well.  Increased vaginal discharge and itching-denies burning, slightly increased urinary frequency Appt made with PCP 12/4 to assess. ED/UC precautions advised and understood.  1. ONSET: When did the pain start?      1.5 weeks ago 2. LOCATION: Where is the pain located?      Right  3. PAIN: How bad is the pain?    (Scale 1-10; or mild, moderate, severe)     6-8/10 4. WORK OR EXERCISE: Has there been any recent work or exercise that involved this part of the body?      Working on her house 5. CAUSE: What do you think is causing the leg pain?     Pulled muscle? 6. OTHER SYMPTOMS: Do you have any other symptoms? (e.g., chest pain,  back pain, breathing difficulty, swelling, rash, fever, numbness, weakness)     When pain is really bad gets nausea and slightly light headed but repositions and resolves. Mild low back pain on the right.  Protocols used: Leg Pain-A-AH

## 2024-03-19 ENCOUNTER — Ambulatory Visit: Admitting: Internal Medicine

## 2024-03-19 ENCOUNTER — Encounter: Payer: Self-pay | Admitting: Internal Medicine

## 2024-03-19 VITALS — BP 110/70 | HR 93 | Temp 97.9°F | Ht 66.5 in | Wt 250.4 lb

## 2024-03-19 DIAGNOSIS — R1031 Right lower quadrant pain: Secondary | ICD-10-CM

## 2024-03-19 NOTE — Progress Notes (Signed)
 Established Patient Office Visit     CC/Reason for Visit: Right lower quadrant abdominal pain  HPI: Erika [name] is a 68 y.o. female who is coming in today for the above mentioned reasons.  For about 2 weeks she has been having right lower quadrant abdominal pain that has been worsening.  She has had some nausea and decreased appetite but no vomiting.  No urinary frequency or dysuria.   Past Medical/Surgical History: Past Medical History:  Diagnosis Date   Anxiety    Arthritis    Bell's palsy    COPD (chronic obstructive pulmonary disease) (HCC)    History of cardiac murmur as a child    Hyperlipidemia    Hypertension    Menopausal syndrome    Mild asthma    no inhalers since stopped smoking   Myocardial infarction (HCC)    2020--pt states no damage   Neuroma of foot    OSA on CPAP    Wears glasses     Past Surgical History:  Procedure Laterality Date   CORONARY IMAGING/OCT N/A 07/14/2021   Procedure: INTRAVASCULAR IMAGING/OCT;  Surgeon: Wendel Lurena POUR, MD;  [facility];  Service: Cardiovascular;  Laterality: N/A;   CORONARY PRESSURE/FFR STUDY N/A 07/14/2021   Procedure: INTRAVASCULAR PRESSURE WIRE/FFR STUDY;  Surgeon: Wendel Lurena POUR, MD;  [facility]: [facility];  Service: Cardiovascular;  Laterality: N/A;   CORONARY STENT INTERVENTION N/A 07/14/2021   Procedure: CORONARY STENT INTERVENTION;  Surgeon: Wendel Lurena POUR, MD;  [facility]: [facility];  Service: Cardiovascular;  Laterality: N/A;   ELBOW SURGERY Right 2002   repair tendon and nerve injury   EXCISION MORTON'S NEUROMA Left 2010   LEFT HEART CATH AND CORONARY ANGIOGRAPHY N/A 07/14/2021   Procedure: LEFT HEART CATH AND CORONARY ANGIOGRAPHY;  Surgeon: Wendel Lurena POUR, MD;  [facility]: [facility];  Service: Cardiovascular;  Laterality: N/A;   ROBOTIC ASSISTED TOTAL HYSTERECTOMY WITH BILATERAL SALPINGO OOPHERECTOMY N/A 12/31/2017   Procedure: XI ROBOTIC ASSISTED TOTAL  HYSTERECTOMY WITH BILATERAL SALPINGO OOPHORECTOMY;  Surgeon: Eloy Herring, MD;  Location: [facility];  Service: Gynecology;  Laterality: N/A;   SHOULDER ARTHROSCOPY W/ ROTATOR CUFF REPAIR Left 09/2016   and burectomy    Social History:  reports that she quit smoking about 23 years ago. Her smoking use included cigarettes. She started smoking about 53 years ago. She has a 30 pack-year smoking history. She has never used smokeless tobacco. She reports that she does not drink alcohol  and does not use drugs.  Allergies: Allergies  Allergen Reactions   Tape Other (See Comments)    PATIENT PREFERS CLOTH TAPE- THE OTHERS DON'T AGREE WITH HER SKIN   Tetracycline Hcl Rash    Family History:  Family History  Problem Relation Age of Onset   Depression Mother    Diabetes Mother    Heart disease Mother    Asthma Sister    Cancer Maternal Aunt    Cancer Maternal Uncle    Uterine cancer Maternal Uncle    Breast cancer Paternal Aunt    Diabetes Maternal Grandmother    Diabetes Maternal Grandfather    Cancer Paternal Grandmother    Cancer Brother    Colon cancer Neg Hx    Colon polyps Neg Hx    Esophageal cancer Neg Hx    Rectal cancer Neg Hx    Stomach cancer Neg Hx      Current Outpatient Medications:    Accu-Chek Softclix  Lancets lancets, Use to check blood sugars once daily. Dx: E11.65, Disp: 100 each, Rfl: 0   acetaminophen  (TYLENOL ) 650 MG CR tablet, Take 650-1,300 mg by mouth every 8 (eight) hours as needed for pain., Disp: , Rfl:    atorvastatin  (LIPITOR) 40 MG tablet, TAKE 1 TABLET BY MOUTH EVERY DAY, Disp: 90 tablet, Rfl: 1   Blood Glucose Monitoring Suppl (ACCU-CHEK GUIDE ME) w/Device KIT, Use to check blood sugars once daily Dx: E11.65, Disp: 1 kit, Rfl: 0   Cholecalciferol  (VITAMIN D3 SUPER STRENGTH) 50 MCG (2000 UT) CAPS, Take 2,000 Units by mouth every evening., Disp: , Rfl:    citalopram  (CELEXA ) 40 MG tablet, TAKE 1 TABLET BY MOUTH EVERYDAY AT BEDTIME, Disp: 90 tablet, Rfl:  1   clopidogrel  (PLAVIX ) 75 MG tablet, TAKE 1 TABLET BY MOUTH EVERY DAY, Disp: 90 tablet, Rfl: 1   cyanocobalamin  (VITAMIN B12) 1000 MCG/ML injection, INJECT 1 ML ONCE A MONTH, Disp: 6 mL, Rfl: 11   diphenhydramine-acetaminophen  (TYLENOL  PM) 25-500 MG TABS tablet, Take 1 tablet by mouth at bedtime as needed (sleep)., Disp: , Rfl:    glucose blood (ACCU-CHEK GUIDE TEST) test strip, Use to check blood sugars once daily Dx E11.65, Disp: 100 each, Rfl: 0   hydrochlorothiazide  (HYDRODIURIL ) 25 MG tablet, TAKE 1 TABLET(25 MG) BY MOUTH EVERY EVENING, Disp: 90 tablet, Rfl: 1   hydrOXYzine  (VISTARIL ) 25 MG capsule, TAKE 1 CAPSULE BY MOUTH AT BEDTIME AND MAY REPEAT DOSE ONE TIME IF NEEDED., Disp: 180 capsule, Rfl: 0   levothyroxine  (SYNTHROID ) 75 MCG tablet, TAKE 1 TABLET BY MOUTH EVERY DAY BEFORE BREAKFAST, Disp: 90 tablet, Rfl: 1   metoprolol  succinate (TOPROL -XL) 25 MG 24 hr tablet, TAKE 1/2 TABLET BY MOUTH DAILY, Disp: 45 tablet, Rfl: 1   nitroGLYCERIN  (NITROSTAT ) 0.4 MG SL tablet, Place 1 tablet (0.4 mg total) under the tongue every 5 (five) minutes as needed for chest pain. Up to 3 times., Disp: 90 tablet, Rfl: 3   SYRINGE-NEEDLE, DISP, 3 ML (BD SAFETYGLIDE SYRINGE/NEEDLE) 25G X 1 3 ML MISC, Use for B12 injections, Disp: 100 each, Rfl: 11   Albuterol -Budesonide  (AIRSUPRA ) 90-80 MCG/ACT AERO, Inhale 1-2 puffs into the lungs every 6 (six) hours as needed (shortness of breath, wheezing)., Disp: 10.7 g, Rfl: 2   methylPREDNISolone  (MEDROL  DOSEPAK) 4 MG TBPK tablet, As directed, Disp: 21 tablet, Rfl: 0  Review of Systems:  Negative unless indicated in HPI.   Physical Exam: Vitals:   03/19/24 1607  BP: 110/70  Pulse: 93  Temp: 97.9 F (36.6 C)  SpO2: 99%  Weight: 250 lb 6.4 oz (113.6 kg)  Height: 5' 6.5 (1.689 m)    Body mass index is 39.81 kg/m.   Physical Exam Abdominal:     General: Abdomen is protuberant. Bowel sounds are decreased. There is no distension.     Palpations: Abdomen is  soft.     Tenderness: There is abdominal tenderness in the right lower quadrant and left lower quadrant. There is guarding and rebound.       Impression and Plan:  RLQ abdominal pain  Right lower quadrant abdominal pain -     Comprehensive metabolic panel with GFR; Future -     CBC with Differential/Platelet; Future -     CT ABDOMEN PELVIS WO CONTRAST; Future   - I am quite concerned about her abdominal exam as she is showing some signs of peritonitis.  This coupled with nausea and anorexia makes me concerned for possible indolent appendicitis.  She  also has some referred pain with rebound tenderness to the left lower quadrant.  Plan to send for labs as well as a stat CT abdomen and pelvis.  Time spent:31 minutes reviewing chart, interviewing and examining patient and formulating plan of care.     Tully Theophilus Andrews, MD [facility]

## 2024-03-20 ENCOUNTER — Ambulatory Visit (HOSPITAL_BASED_OUTPATIENT_CLINIC_OR_DEPARTMENT_OTHER)
Admission: RE | Admit: 2024-03-20 | Discharge: 2024-03-20 | Disposition: A | Source: Ambulatory Visit | Attending: Internal Medicine | Admitting: Internal Medicine

## 2024-03-20 DIAGNOSIS — R1031 Right lower quadrant pain: Secondary | ICD-10-CM

## 2024-03-20 LAB — CBC WITH DIFFERENTIAL/PLATELET
Basophils Absolute: 0.1 K/uL (ref 0.0–0.1)
Basophils Relative: 1.3 % (ref 0.0–3.0)
Eosinophils Absolute: 0.1 K/uL (ref 0.0–0.7)
Eosinophils Relative: 1.7 % (ref 0.0–5.0)
HCT: 37.4 % (ref 36.0–46.0)
Hemoglobin: 12.3 g/dL (ref 12.0–15.0)
Lymphocytes Relative: 29.5 % (ref 12.0–46.0)
Lymphs Abs: 2.5 K/uL (ref 0.7–4.0)
MCHC: 32.8 g/dL (ref 30.0–36.0)
MCV: 79.7 fl (ref 78.0–100.0)
Monocytes Absolute: 0.4 K/uL (ref 0.1–1.0)
Monocytes Relative: 5.1 % (ref 3.0–12.0)
Neutro Abs: 5.3 K/uL (ref 1.4–7.7)
Neutrophils Relative %: 62.4 % (ref 43.0–77.0)
Platelets: 344 K/uL (ref 150.0–400.0)
RBC: 4.69 Mil/uL (ref 3.87–5.11)
RDW: 15 % (ref 11.5–15.5)
WBC: 8.5 K/uL (ref 4.0–10.5)

## 2024-03-20 LAB — COMPREHENSIVE METABOLIC PANEL WITH GFR
ALT: 17 U/L (ref 0–35)
AST: 18 U/L (ref 0–37)
Albumin: 4.1 g/dL (ref 3.5–5.2)
Alkaline Phosphatase: 71 U/L (ref 39–117)
BUN: 16 mg/dL (ref 6–23)
CO2: 30 meq/L (ref 19–32)
Calcium: 9.4 mg/dL (ref 8.4–10.5)
Chloride: 99 meq/L (ref 96–112)
Creatinine, Ser: 1.11 mg/dL (ref 0.40–1.20)
GFR: 50.81 mL/min — ABNORMAL LOW (ref >60.00)
Glucose, Bld: 155 mg/dL — ABNORMAL HIGH (ref 70–99)
Potassium: 3.6 meq/L (ref 3.5–5.1)
Sodium: 139 meq/L (ref 135–145)
Total Bilirubin: 0.3 mg/dL (ref 0.2–1.2)
Total Protein: 6.8 g/dL (ref 6.0–8.3)

## 2024-03-23 ENCOUNTER — Telehealth: Payer: Self-pay | Admitting: *Deleted

## 2024-03-23 ENCOUNTER — Ambulatory Visit: Payer: Self-pay | Admitting: Internal Medicine

## 2024-03-23 DIAGNOSIS — M79604 Pain in right leg: Secondary | ICD-10-CM

## 2024-03-23 NOTE — Telephone Encounter (Signed)
 Copied from CRM [id number]. Topic: Clinical - Lab/Test Results >> Mar 20, 2024  4:15 PM Shereese L wrote: Reason for CRM: Patient requesting a call back to go over the lab results for the CT Scan taken today

## 2024-03-23 NOTE — Telephone Encounter (Signed)
 Patient is aware of CT result.  Patient states that she is still having pain.  She is now sleeping in a recliner.   She feels pulling and sharp pain with her right thigh/side area.  It is also going to her right knee and leg.

## 2024-03-23 NOTE — Telephone Encounter (Signed)
 Copied from CRM [id number]. Topic: Clinical - Medical Advice >> Mar 20, 2024 10:51 AM Ahlexyia S wrote: Reason for CRM: Pt called in stating that she had a CT scan this morning and she is wanting to know her next steps. Pt is requesting a callback for this. Informed pt that someone will contact her shortly. Pt has been advised of turnaround time for callbacks.

## 2024-03-24 NOTE — Telephone Encounter (Signed)
 Referral placed and the patient is aware.

## 2024-03-26 ENCOUNTER — Other Ambulatory Visit (HOSPITAL_BASED_OUTPATIENT_CLINIC_OR_DEPARTMENT_OTHER): Payer: Self-pay | Admitting: Student

## 2024-03-26 ENCOUNTER — Ambulatory Visit (INDEPENDENT_AMBULATORY_CARE_PROVIDER_SITE_OTHER): Admitting: Student

## 2024-03-26 ENCOUNTER — Ambulatory Visit (HOSPITAL_BASED_OUTPATIENT_CLINIC_OR_DEPARTMENT_OTHER)

## 2024-03-26 ENCOUNTER — Ambulatory Visit (INDEPENDENT_AMBULATORY_CARE_PROVIDER_SITE_OTHER)

## 2024-03-26 ENCOUNTER — Other Ambulatory Visit (HOSPITAL_BASED_OUTPATIENT_CLINIC_OR_DEPARTMENT_OTHER): Payer: Self-pay

## 2024-03-26 DIAGNOSIS — M1611 Unilateral primary osteoarthritis, right hip: Secondary | ICD-10-CM

## 2024-03-26 DIAGNOSIS — M5441 Lumbago with sciatica, right side: Secondary | ICD-10-CM | POA: Diagnosis not present

## 2024-03-26 DIAGNOSIS — M25551 Pain in right hip: Secondary | ICD-10-CM

## 2024-03-26 DIAGNOSIS — G8929 Other chronic pain: Secondary | ICD-10-CM

## 2024-03-26 DIAGNOSIS — M1711 Unilateral primary osteoarthritis, right knee: Secondary | ICD-10-CM

## 2024-03-26 MED ORDER — METHYLPREDNISOLONE 4 MG PO TBPK
ORAL_TABLET | ORAL | 0 refills | Status: AC
  Filled 2024-03-26: qty 21, 6d supply, fill #0
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
## 2024-03-29 ENCOUNTER — Other Ambulatory Visit: Payer: Self-pay | Admitting: Internal Medicine

## 2024-03-29 DIAGNOSIS — I10 Essential (primary) hypertension: Secondary | ICD-10-CM

## 2024-03-30 ENCOUNTER — Other Ambulatory Visit

## 2024-03-30 ENCOUNTER — Other Ambulatory Visit: Payer: Self-pay | Admitting: Internal Medicine

## 2024-03-30 ENCOUNTER — Telehealth: Payer: Self-pay

## 2024-03-30 DIAGNOSIS — J209 Acute bronchitis, unspecified: Secondary | ICD-10-CM

## 2024-03-30 NOTE — Progress Notes (Signed)
° °  03/30/2024  Patient ID: Erika [name], female   DOB: 06-Mar-1956, 68 y.o.   MRN: [id number]  Attempted to contact patient for scheduled appointment for medication management. Left HIPAA compliant message for patient to return my call at their convenience.   Erika [name], PharmD Clinical Pharmacist [phone]

## 2024-04-16 ENCOUNTER — Other Ambulatory Visit: Payer: Self-pay | Admitting: Internal Medicine

## 2024-04-26 ENCOUNTER — Other Ambulatory Visit: Payer: Self-pay | Admitting: Internal Medicine

## 2024-04-26 DIAGNOSIS — F3342 Major depressive disorder, recurrent, in full remission: Secondary | ICD-10-CM

## 2024-04-26 DIAGNOSIS — E039 Hypothyroidism, unspecified: Secondary | ICD-10-CM

## 2024-04-29 NOTE — Telephone Encounter (Signed)
 Order placed.NFN

## 2024-04-30 ENCOUNTER — Telehealth: Payer: Self-pay | Admitting: Pulmonary Disease

## 2024-04-30 NOTE — Telephone Encounter (Signed)
 Will you sign the order for this patient's  CPAP supplies, please

## 2024-05-04 NOTE — Telephone Encounter (Signed)
 Need signature for patient's DME order, please

## 2024-05-06 ENCOUNTER — Other Ambulatory Visit
